# Patient Record
Sex: Female | Born: 1935 | Race: White | Hispanic: No | Smoking: Former smoker
Health system: Southern US, Community
[De-identification: ages and names within clinical notes are randomized; demographics above are authoritative.]

## PROBLEM LIST (undated history)

## (undated) DIAGNOSIS — K219 Gastro-esophageal reflux disease without esophagitis: Secondary | ICD-10-CM

## (undated) DIAGNOSIS — J189 Pneumonia, unspecified organism: Secondary | ICD-10-CM

## (undated) DIAGNOSIS — N390 Urinary tract infection, site not specified: Secondary | ICD-10-CM

## (undated) DIAGNOSIS — I5042 Chronic combined systolic (congestive) and diastolic (congestive) heart failure: Secondary | ICD-10-CM

## (undated) DIAGNOSIS — I255 Ischemic cardiomyopathy: Secondary | ICD-10-CM

## (undated) DIAGNOSIS — I442 Atrioventricular block, complete: Secondary | ICD-10-CM

## (undated) DIAGNOSIS — R000 Tachycardia, unspecified: Secondary | ICD-10-CM

## (undated) DIAGNOSIS — M1990 Unspecified osteoarthritis, unspecified site: Secondary | ICD-10-CM

## (undated) DIAGNOSIS — J449 Chronic obstructive pulmonary disease, unspecified: Secondary | ICD-10-CM

## (undated) DIAGNOSIS — I10 Essential (primary) hypertension: Secondary | ICD-10-CM

## (undated) DIAGNOSIS — I951 Orthostatic hypotension: Secondary | ICD-10-CM

## (undated) DIAGNOSIS — I6529 Occlusion and stenosis of unspecified carotid artery: Secondary | ICD-10-CM

## (undated) DIAGNOSIS — M8580 Other specified disorders of bone density and structure, unspecified site: Secondary | ICD-10-CM

## (undated) DIAGNOSIS — Z950 Presence of cardiac pacemaker: Secondary | ICD-10-CM

## (undated) DIAGNOSIS — R0602 Shortness of breath: Secondary | ICD-10-CM

## (undated) DIAGNOSIS — R55 Syncope and collapse: Secondary | ICD-10-CM

## (undated) DIAGNOSIS — Z8489 Family history of other specified conditions: Secondary | ICD-10-CM

## (undated) DIAGNOSIS — G459 Transient cerebral ischemic attack, unspecified: Secondary | ICD-10-CM

## (undated) HISTORY — DX: Essential (primary) hypertension: I10

## (undated) HISTORY — DX: Unspecified osteoarthritis, unspecified site: M19.90

## (undated) HISTORY — DX: Tachycardia, unspecified: R00.0

## (undated) HISTORY — DX: Presence of cardiac pacemaker: Z95.0

## (undated) HISTORY — DX: Other specified disorders of bone density and structure, unspecified site: M85.80

## (undated) HISTORY — PX: PACEMAKER PLACEMENT: SHX43

## (undated) HISTORY — PX: CHOLECYSTECTOMY: SHX55

## (undated) HISTORY — DX: Chronic combined systolic (congestive) and diastolic (congestive) heart failure: I50.42

## (undated) HISTORY — DX: Ischemic cardiomyopathy: I25.5

## (undated) HISTORY — DX: Atrioventricular block, complete: I44.2

## (undated) HISTORY — DX: Occlusion and stenosis of unspecified carotid artery: I65.29

## (undated) HISTORY — DX: Chronic obstructive pulmonary disease, unspecified: J44.9

---

## 1997-06-07 ENCOUNTER — Other Ambulatory Visit: Admission: RE | Admit: 1997-06-07 | Discharge: 1997-06-07 | Payer: Self-pay | Admitting: *Deleted

## 1998-06-23 ENCOUNTER — Other Ambulatory Visit: Admission: RE | Admit: 1998-06-23 | Discharge: 1998-06-23 | Payer: Self-pay | Admitting: *Deleted

## 1999-05-18 ENCOUNTER — Encounter: Payer: Self-pay | Admitting: *Deleted

## 1999-05-18 ENCOUNTER — Encounter: Admission: RE | Admit: 1999-05-18 | Discharge: 1999-05-18 | Payer: Self-pay | Admitting: *Deleted

## 1999-08-12 ENCOUNTER — Other Ambulatory Visit: Admission: RE | Admit: 1999-08-12 | Discharge: 1999-08-12 | Payer: Self-pay | Admitting: *Deleted

## 1999-08-14 ENCOUNTER — Encounter: Payer: Self-pay | Admitting: *Deleted

## 1999-08-14 ENCOUNTER — Encounter: Admission: RE | Admit: 1999-08-14 | Discharge: 1999-08-14 | Payer: Self-pay | Admitting: *Deleted

## 1999-10-28 ENCOUNTER — Encounter: Admission: RE | Admit: 1999-10-28 | Discharge: 1999-10-28 | Payer: Self-pay | Admitting: *Deleted

## 1999-10-28 ENCOUNTER — Encounter: Payer: Self-pay | Admitting: *Deleted

## 2000-03-28 ENCOUNTER — Inpatient Hospital Stay (HOSPITAL_COMMUNITY): Admission: EM | Admit: 2000-03-28 | Discharge: 2000-04-01 | Payer: Self-pay | Admitting: Emergency Medicine

## 2000-03-28 ENCOUNTER — Encounter: Payer: Self-pay | Admitting: Emergency Medicine

## 2000-03-29 ENCOUNTER — Encounter: Payer: Self-pay | Admitting: Cardiology

## 2000-03-31 ENCOUNTER — Encounter: Payer: Self-pay | Admitting: Internal Medicine

## 2000-08-12 ENCOUNTER — Other Ambulatory Visit: Admission: RE | Admit: 2000-08-12 | Discharge: 2000-08-12 | Payer: Self-pay | Admitting: *Deleted

## 2000-10-28 ENCOUNTER — Encounter: Payer: Self-pay | Admitting: Internal Medicine

## 2000-10-28 ENCOUNTER — Encounter: Admission: RE | Admit: 2000-10-28 | Discharge: 2000-10-28 | Payer: Self-pay | Admitting: *Deleted

## 2001-09-12 ENCOUNTER — Other Ambulatory Visit: Admission: RE | Admit: 2001-09-12 | Discharge: 2001-09-12 | Payer: Self-pay | Admitting: *Deleted

## 2001-11-01 ENCOUNTER — Encounter: Admission: RE | Admit: 2001-11-01 | Discharge: 2001-11-01 | Payer: Self-pay | Admitting: *Deleted

## 2001-11-01 ENCOUNTER — Encounter: Payer: Self-pay | Admitting: *Deleted

## 2002-11-05 ENCOUNTER — Encounter: Admission: RE | Admit: 2002-11-05 | Discharge: 2002-11-05 | Payer: Self-pay | Admitting: Internal Medicine

## 2002-11-05 ENCOUNTER — Encounter: Payer: Self-pay | Admitting: Internal Medicine

## 2002-11-14 HISTORY — PX: HERNIA REPAIR: SHX51

## 2002-11-29 ENCOUNTER — Encounter: Admission: RE | Admit: 2002-11-29 | Discharge: 2002-11-29 | Payer: Self-pay

## 2002-11-30 ENCOUNTER — Ambulatory Visit (HOSPITAL_BASED_OUTPATIENT_CLINIC_OR_DEPARTMENT_OTHER): Admission: RE | Admit: 2002-11-30 | Discharge: 2002-11-30 | Payer: Self-pay

## 2003-05-29 ENCOUNTER — Emergency Department (HOSPITAL_COMMUNITY): Admission: EM | Admit: 2003-05-29 | Discharge: 2003-05-30 | Payer: Self-pay | Admitting: Emergency Medicine

## 2003-08-08 ENCOUNTER — Encounter: Admission: RE | Admit: 2003-08-08 | Discharge: 2003-08-08 | Payer: Self-pay | Admitting: Internal Medicine

## 2003-11-22 ENCOUNTER — Encounter: Admission: RE | Admit: 2003-11-22 | Discharge: 2003-11-22 | Payer: Self-pay | Admitting: Internal Medicine

## 2004-01-06 ENCOUNTER — Other Ambulatory Visit: Admission: RE | Admit: 2004-01-06 | Discharge: 2004-01-06 | Payer: Self-pay | Admitting: *Deleted

## 2004-03-31 ENCOUNTER — Ambulatory Visit: Payer: Self-pay | Admitting: Internal Medicine

## 2004-05-05 ENCOUNTER — Ambulatory Visit: Payer: Self-pay | Admitting: Internal Medicine

## 2004-05-29 ENCOUNTER — Ambulatory Visit: Payer: Self-pay | Admitting: Internal Medicine

## 2004-07-01 ENCOUNTER — Ambulatory Visit: Payer: Self-pay | Admitting: Internal Medicine

## 2004-07-31 ENCOUNTER — Ambulatory Visit: Payer: Self-pay | Admitting: Internal Medicine

## 2004-09-01 ENCOUNTER — Ambulatory Visit: Payer: Self-pay | Admitting: Internal Medicine

## 2004-10-01 ENCOUNTER — Ambulatory Visit: Payer: Self-pay | Admitting: Internal Medicine

## 2004-10-13 HISTORY — PX: EYE SURGERY: SHX253

## 2004-11-03 ENCOUNTER — Ambulatory Visit (HOSPITAL_COMMUNITY): Admission: RE | Admit: 2004-11-03 | Discharge: 2004-11-04 | Payer: Self-pay | Admitting: Ophthalmology

## 2004-11-26 ENCOUNTER — Ambulatory Visit: Payer: Self-pay | Admitting: Internal Medicine

## 2004-12-17 ENCOUNTER — Ambulatory Visit: Payer: Self-pay | Admitting: Internal Medicine

## 2004-12-18 ENCOUNTER — Encounter: Admission: RE | Admit: 2004-12-18 | Discharge: 2004-12-18 | Payer: Self-pay | Admitting: Internal Medicine

## 2005-01-14 ENCOUNTER — Ambulatory Visit: Payer: Self-pay | Admitting: Internal Medicine

## 2005-02-16 ENCOUNTER — Ambulatory Visit: Payer: Self-pay | Admitting: Internal Medicine

## 2005-04-08 ENCOUNTER — Ambulatory Visit: Payer: Self-pay | Admitting: Internal Medicine

## 2005-05-06 ENCOUNTER — Ambulatory Visit: Payer: Self-pay | Admitting: Internal Medicine

## 2005-06-04 ENCOUNTER — Ambulatory Visit: Payer: Self-pay | Admitting: Internal Medicine

## 2005-06-24 ENCOUNTER — Emergency Department (HOSPITAL_COMMUNITY): Admission: EM | Admit: 2005-06-24 | Discharge: 2005-06-24 | Payer: Self-pay | Admitting: Emergency Medicine

## 2005-07-14 ENCOUNTER — Ambulatory Visit: Payer: Self-pay | Admitting: Internal Medicine

## 2005-08-24 ENCOUNTER — Ambulatory Visit: Payer: Self-pay | Admitting: Internal Medicine

## 2005-09-27 ENCOUNTER — Ambulatory Visit: Payer: Self-pay | Admitting: Internal Medicine

## 2005-11-03 ENCOUNTER — Ambulatory Visit: Payer: Self-pay | Admitting: Internal Medicine

## 2005-12-01 ENCOUNTER — Ambulatory Visit: Payer: Self-pay | Admitting: Internal Medicine

## 2005-12-20 ENCOUNTER — Encounter: Admission: RE | Admit: 2005-12-20 | Discharge: 2005-12-20 | Payer: Self-pay | Admitting: Internal Medicine

## 2005-12-30 ENCOUNTER — Ambulatory Visit: Payer: Self-pay | Admitting: Internal Medicine

## 2006-01-27 ENCOUNTER — Ambulatory Visit: Payer: Self-pay | Admitting: Internal Medicine

## 2006-02-21 ENCOUNTER — Ambulatory Visit: Payer: Self-pay | Admitting: Internal Medicine

## 2006-03-24 ENCOUNTER — Ambulatory Visit: Payer: Self-pay | Admitting: Internal Medicine

## 2006-04-07 ENCOUNTER — Ambulatory Visit: Payer: Self-pay | Admitting: Internal Medicine

## 2006-04-21 ENCOUNTER — Ambulatory Visit: Payer: Self-pay | Admitting: Internal Medicine

## 2006-05-19 ENCOUNTER — Ambulatory Visit: Payer: Self-pay | Admitting: Internal Medicine

## 2006-06-16 ENCOUNTER — Ambulatory Visit: Payer: Self-pay | Admitting: Internal Medicine

## 2006-06-24 ENCOUNTER — Encounter: Admission: RE | Admit: 2006-06-24 | Discharge: 2006-06-24 | Payer: Self-pay | Admitting: Internal Medicine

## 2006-07-11 ENCOUNTER — Other Ambulatory Visit: Admission: RE | Admit: 2006-07-11 | Discharge: 2006-07-11 | Payer: Self-pay | Admitting: Obstetrics & Gynecology

## 2006-07-14 ENCOUNTER — Ambulatory Visit: Payer: Self-pay | Admitting: Internal Medicine

## 2006-07-27 ENCOUNTER — Ambulatory Visit (HOSPITAL_COMMUNITY): Admission: RE | Admit: 2006-07-27 | Discharge: 2006-07-27 | Payer: Self-pay | Admitting: Gastroenterology

## 2006-08-11 ENCOUNTER — Ambulatory Visit: Payer: Self-pay | Admitting: Internal Medicine

## 2006-09-08 ENCOUNTER — Ambulatory Visit: Payer: Self-pay | Admitting: Internal Medicine

## 2006-10-06 ENCOUNTER — Ambulatory Visit: Payer: Self-pay | Admitting: Internal Medicine

## 2006-11-03 ENCOUNTER — Ambulatory Visit: Payer: Self-pay | Admitting: Internal Medicine

## 2006-12-01 ENCOUNTER — Ambulatory Visit: Payer: Self-pay | Admitting: Internal Medicine

## 2006-12-28 ENCOUNTER — Encounter: Admission: RE | Admit: 2006-12-28 | Discharge: 2006-12-28 | Payer: Self-pay | Admitting: Internal Medicine

## 2006-12-29 ENCOUNTER — Ambulatory Visit: Payer: Self-pay | Admitting: Internal Medicine

## 2007-01-26 ENCOUNTER — Ambulatory Visit: Payer: Self-pay | Admitting: Internal Medicine

## 2007-02-23 ENCOUNTER — Ambulatory Visit: Payer: Self-pay | Admitting: Internal Medicine

## 2007-03-20 ENCOUNTER — Ambulatory Visit: Payer: Self-pay

## 2007-06-22 ENCOUNTER — Ambulatory Visit: Payer: Self-pay | Admitting: Internal Medicine

## 2007-09-14 ENCOUNTER — Ambulatory Visit: Payer: Self-pay | Admitting: Internal Medicine

## 2007-10-22 ENCOUNTER — Inpatient Hospital Stay (HOSPITAL_COMMUNITY): Admission: EM | Admit: 2007-10-22 | Discharge: 2007-10-24 | Payer: Self-pay | Admitting: Emergency Medicine

## 2007-10-22 ENCOUNTER — Ambulatory Visit: Payer: Self-pay | Admitting: Cardiology

## 2007-10-23 ENCOUNTER — Encounter (INDEPENDENT_AMBULATORY_CARE_PROVIDER_SITE_OTHER): Payer: Self-pay | Admitting: Emergency Medicine

## 2007-12-19 ENCOUNTER — Ambulatory Visit: Payer: Self-pay | Admitting: Internal Medicine

## 2008-01-01 ENCOUNTER — Encounter: Admission: RE | Admit: 2008-01-01 | Discharge: 2008-01-01 | Payer: Self-pay | Admitting: Internal Medicine

## 2008-04-09 ENCOUNTER — Ambulatory Visit: Payer: Self-pay | Admitting: Internal Medicine

## 2008-04-11 ENCOUNTER — Encounter: Payer: Self-pay | Admitting: Internal Medicine

## 2008-05-01 ENCOUNTER — Ambulatory Visit: Payer: Self-pay | Admitting: Internal Medicine

## 2008-05-27 ENCOUNTER — Emergency Department (HOSPITAL_COMMUNITY): Admission: EM | Admit: 2008-05-27 | Discharge: 2008-05-27 | Payer: Self-pay | Admitting: Emergency Medicine

## 2008-05-29 ENCOUNTER — Ambulatory Visit: Payer: Self-pay | Admitting: Internal Medicine

## 2008-06-06 DIAGNOSIS — M949 Disorder of cartilage, unspecified: Secondary | ICD-10-CM

## 2008-06-06 DIAGNOSIS — M1990 Unspecified osteoarthritis, unspecified site: Secondary | ICD-10-CM | POA: Insufficient documentation

## 2008-06-06 DIAGNOSIS — M899 Disorder of bone, unspecified: Secondary | ICD-10-CM | POA: Insufficient documentation

## 2008-06-06 DIAGNOSIS — I442 Atrioventricular block, complete: Secondary | ICD-10-CM

## 2008-06-06 DIAGNOSIS — Z950 Presence of cardiac pacemaker: Secondary | ICD-10-CM | POA: Insufficient documentation

## 2008-06-06 DIAGNOSIS — J439 Emphysema, unspecified: Secondary | ICD-10-CM

## 2008-06-06 DIAGNOSIS — I10 Essential (primary) hypertension: Secondary | ICD-10-CM

## 2008-06-06 DIAGNOSIS — R000 Tachycardia, unspecified: Secondary | ICD-10-CM | POA: Insufficient documentation

## 2008-07-03 ENCOUNTER — Ambulatory Visit: Payer: Self-pay | Admitting: Internal Medicine

## 2008-07-31 ENCOUNTER — Ambulatory Visit: Payer: Self-pay | Admitting: Internal Medicine

## 2008-08-28 ENCOUNTER — Ambulatory Visit: Payer: Self-pay | Admitting: Internal Medicine

## 2008-09-23 ENCOUNTER — Encounter: Payer: Self-pay | Admitting: Internal Medicine

## 2008-09-23 ENCOUNTER — Ambulatory Visit: Payer: Self-pay

## 2008-10-24 ENCOUNTER — Ambulatory Visit: Payer: Self-pay | Admitting: Internal Medicine

## 2008-11-19 ENCOUNTER — Emergency Department (HOSPITAL_COMMUNITY): Admission: EM | Admit: 2008-11-19 | Discharge: 2008-11-20 | Payer: Self-pay | Admitting: Emergency Medicine

## 2008-11-20 ENCOUNTER — Telehealth: Payer: Self-pay | Admitting: Internal Medicine

## 2008-11-27 ENCOUNTER — Ambulatory Visit: Payer: Self-pay | Admitting: Internal Medicine

## 2009-01-01 ENCOUNTER — Ambulatory Visit: Payer: Self-pay | Admitting: Internal Medicine

## 2009-01-07 ENCOUNTER — Ambulatory Visit: Payer: Self-pay | Admitting: Internal Medicine

## 2009-01-13 ENCOUNTER — Ambulatory Visit: Payer: Self-pay | Admitting: Internal Medicine

## 2009-01-16 LAB — CONVERTED CEMR LAB
BUN: 17 mg/dL (ref 6–23)
Basophils Absolute: 0.1 10*3/uL (ref 0.0–0.1)
CO2: 29 meq/L (ref 19–32)
Eosinophils Absolute: 0.1 10*3/uL (ref 0.0–0.7)
GFR calc non Af Amer: 56.04 mL/min (ref >60)
Glucose, Bld: 101 mg/dL — ABNORMAL HIGH (ref 70–99)
HCT: 45 % (ref 36.0–46.0)
Hemoglobin: 15.2 g/dL — ABNORMAL HIGH (ref 12.0–15.0)
Lymphs Abs: 1.5 10*3/uL (ref 0.7–4.0)
MCHC: 33.7 g/dL (ref 30.0–36.0)
Monocytes Absolute: 0.5 10*3/uL (ref 0.1–1.0)
Monocytes Relative: 7.5 % (ref 3.0–12.0)
Neutro Abs: 4.6 10*3/uL (ref 1.4–7.7)
Platelets: 192 10*3/uL (ref 150.0–400.0)
Potassium: 4.1 meq/L (ref 3.5–5.1)
RDW: 13.6 % (ref 11.5–14.6)
aPTT: 27.2 s (ref 21.7–28.8)

## 2009-01-20 ENCOUNTER — Ambulatory Visit: Payer: Self-pay | Admitting: Internal Medicine

## 2009-01-20 ENCOUNTER — Ambulatory Visit (HOSPITAL_COMMUNITY): Admission: RE | Admit: 2009-01-20 | Discharge: 2009-01-20 | Payer: Self-pay | Admitting: Internal Medicine

## 2009-02-03 ENCOUNTER — Ambulatory Visit: Payer: Self-pay

## 2009-02-03 ENCOUNTER — Encounter: Payer: Self-pay | Admitting: Internal Medicine

## 2009-03-17 ENCOUNTER — Ambulatory Visit: Payer: Self-pay | Admitting: Internal Medicine

## 2009-06-16 ENCOUNTER — Encounter: Admission: RE | Admit: 2009-06-16 | Discharge: 2009-06-16 | Payer: Self-pay | Admitting: Internal Medicine

## 2009-09-29 ENCOUNTER — Ambulatory Visit: Payer: Self-pay | Admitting: Internal Medicine

## 2009-09-29 DIAGNOSIS — I472 Ventricular tachycardia, unspecified: Secondary | ICD-10-CM | POA: Insufficient documentation

## 2010-04-01 ENCOUNTER — Encounter: Payer: Self-pay | Admitting: Internal Medicine

## 2010-04-01 ENCOUNTER — Ambulatory Visit: Admission: RE | Admit: 2010-04-01 | Discharge: 2010-04-01 | Payer: Self-pay | Source: Home / Self Care

## 2010-04-05 ENCOUNTER — Encounter: Payer: Self-pay | Admitting: Internal Medicine

## 2010-04-14 NOTE — Cardiovascular Report (Signed)
Summary: Office Visit   Office Visit   Imported By: Roderic Ovens 10/01/2009 16:18:18  _____________________________________________________________________  External Attachment:    Type:   Image     Comment:   External Document

## 2010-04-14 NOTE — Assessment & Plan Note (Signed)
    Patient Instructions: 1)  Your physician recommends that you schedule a follow-up appointment in: 12 MONTHS

## 2010-04-14 NOTE — Cardiovascular Report (Signed)
Summary: Office Visit   Office Visit   Imported By: Roderic Ovens 03/20/2009 15:26:10  _____________________________________________________________________  External Attachment:    Type:   Image     Comment:   External Document

## 2010-04-14 NOTE — Assessment & Plan Note (Signed)
Summary: 1 year rov      Allergies Added:   Primary Provider:  [name]  CC:  1 year rov.  Pt states she is feeling fine.  She also states her BP is only high in the doctors office and fine at home.Marland Kitchen  History of Present Illness:   Jamie [name] is seen in followup for complete heart block for which he is status post pacemaker implantation which  is programmed  in the VDD mode.  she had no complaints of chest pain or shortness of breath; there has been no change in her exercise tolerance.  she is exercising and feels terrific  Echo 2009 demonstrated    Left ventricular ejection fraction was estimated to be 60 %.   -  There was mild mitral annular calcification. There was mild         mitral valvular regurgitation.  Physical Exam  General:  The patient was alert and oriented in no acute distress. HEENT Normal.  Neck veins were flat, carotids were brisk.  Lungs were clear.  Heart sounds were regular without murmurs or gallops.  Abdomen was soft with active bowel sounds. There is no clubbing cyanosis or edema. Skin Warm and dry    Current Medications (verified): 1)  Metoprolol Tartrate 50 Mg Tabs (Metoprolol Tartrate) .... One By Mouth Two Times A Day 2)  Alprazolam 0.5 Mg Tabs (Alprazolam) .... As Needed For Sleep 3)  Caltrate 600 1500 Mg Tabs (Calcium Carbonate) .... One By Mouth Daily 4)  Fish Oil 1200 Mg Caps (Omega-3 Fatty Acids) .... Take 1 By Mouth Two Times A Day 5)  Daily Vitamins  Tabs (Multiple Vitamin) .... One By Mouth Daily 6)  Glucosamine Chondroitin Complx  Caps (Glucosamine-Chondroit-Biofl-Mn) .... One By Mouth Daily 7)  Aspirin 81 Mg Tbec (Aspirin) .... Take One Tablet By Mouth Daily 8)  Proair Hfa 108 (90 Base) Mcg/act Aers (Albuterol Sulfate) .... 2 Puffs As Needed 9)  Cod Liver Oil  Oil (Cod Liver Oil) .... Take 1 By Mouth Once Daily 10)  Garlic Oil 1000 Mg Caps (Garlic) .... Take 1-2 Weekly  Allergies (verified): 1)  * Vicodin  Past History:  Past  Medical History: Last updated: 06/06/2008 AV BLOCK, COMPLETE (ICD-426.0) PACEMAKER, PERMANENT (ICD-V45.01) TACHYCARDIA (ICD-785.0) HYPERTENSION, UNSPECIFIED (ICD-401.9) OSTEOPENIA (ICD-733.90) OSTEOARTHRITIS (ICD-715.90) COPD (ICD-496)    Past Surgical History: Last updated: 06/06/2008 hernia -- 9/04 eyes - 8/06 pacemaker -- medtronic  Family History: Last updated: 06/06/2008 Negative for any premature coronary artery disease  Social History: Last updated: 06/06/2008 Tobacco Use - Former. 1989 Alcohol Use - no Drug Use - no Retired   Vital Signs:  Patient profile:   77 year old female Height:      64 inches Weight:      129 pounds BMI:     22.22 Pulse rate:   61 / minute Pulse rhythm:   regular BP sitting:   172 / 80  (left arm) Cuff size:   regular  Vitals Entered By: Judithe Modest CMA (September 29, 2009 9:28 AM)   PPM Specifications Following MD:  Sherryl Manges, MD     PPM Vendor:  Medtronic     PPM Model Number:  ADDRO1     PPM Serial Number:  [id number] PPM DOI:  11/0812010     PPM Implanting MD:  Sherryl Manges, MD  Lead 1    Location: RA     DOI: 03/30/2000     Model #: 1342T     Serial #:  [id number]     Status: active Lead 2    Location: RV     DOI: 03/30/2000     Model #: [id number]     Serial #: [id number]     Status: active  Magnet Response Rate:  BOL 85 ERI 65  Indications:  CHB  Explantation Comments:  11/8/12010 Medtronic Kappa [id number] H  explanted  PPM Follow Up Battery Voltage:  2.79 V     Battery Est. Longevity:  42yrs     Pacer Dependent:  Yes     Right Ventricle  Amplitude: PACED mV, Impedance: 525 ohms, Threshold: 0.750 V at 0.40 msec  Episodes Coumadin:  No Ventricular High Rate:  1     Ventricular Pacing:  100%  Parameters Mode:  VDD     Lower Rate Limit:  60     Upper Rate Limit:  120 Paced AV Delay:  N/A     Sensed AV Delay:  120 Next Cardiology Appt Due:  03/16/2010 Tech Comments:  1 VHR EPISODE LASTING 6 SECONDS.  BATTERY LONGEVITY 12  YRS.  NORMAL DEVICE FUNCTION.  CHANGED RV OUTPUT FROM 2.00 TO 2.50 V.  ROV IN 6 MTHS W/CLINIC. Jamie [name]  September 29, 2009 9:44 AM  Impression & Recommendations:  Problem # 1:  AV BLOCK, COMPLETE (ICD-426.0) The patient has complete heart block. She also apparently has a pacemaker the doesn't like to pace the  atrium. As such she is programmed in the VDD mode.  as noted she has some AV isorhythmic dissociation with loss of P. synchronous pacing Her updated medication list for this problem includes:    Metoprolol Tartrate 50 Mg Tabs (Metoprolol tartrate) ..... One by mouth two times a day    Aspirin 81 Mg Tbec (Aspirin) .Marland Kitchen... Take one tablet by mouth daily  Problem # 2:  PACEMAKER, PERMANENT (ICD-V45.01) Device parameters and data were reviewed and no changes were made  Problem # 3:  VENTRICULAR TACHYCARDIA (ICD-427.1) identified on her device; however, with her normal left ventricular function we will not pursue this further; it is monorhythmic Her updated medication list for this problem includes:    Metoprolol Tartrate 50 Mg Tabs (Metoprolol tartrate) ..... One by mouth two times a day    Aspirin 81 Mg Tbec (Aspirin) .Marland Kitchen... Take one tablet by mouth daily  Other Orders: EKG w/ Interpretation (93000)  Patient Instructions: 1)  Your physician wants you to follow-up in:6 months with device clinic.   You will receive a reminder letter in the mail two months in advance. If you don't receive a letter, please call our office to schedule the follow-up appointment.

## 2010-04-14 NOTE — Assessment & Plan Note (Signed)
Summary: 3 mo f/u    Visit Type:  3 mo f/u Primary Provider:  [name]  CC:  pt states she once in awhile feels a "ping" where her pacemaker is and thinks this is from maybe reaching too far..pt also wants to k now if it is too early since new pacemaker insert to have her mammogram..denies any other complaints today.  History of Present Illness:   Jamie [name] is seen in followup for complete heart block for which he is status post pacemaker implantation which  is programmed  in the VDD mode.  she had no complaints of chest pain or shortness of breath; there has been no change in her exercise tolerance.  she is exercising and feels terrific  Problems Prior to Update: 1)  Av Block, Complete  (ICD-426.0) 2)  Pacemaker, Permanent  (ICD-V45.01) 3)  Tachycardia  (ICD-785.0) 4)  Hypertension, Unspecified  (ICD-401.9) 5)  Osteopenia  (ICD-733.90) 6)  Osteoarthritis  (ICD-715.90) 7)  COPD  (ICD-496)  Current Medications (verified): 1)  Metoprolol Tartrate 50 Mg Tabs (Metoprolol Tartrate) .... One By Mouth Two Times A Day 2)  Alprazolam 0.5 Mg Tabs (Alprazolam) .... As Needed For Sleep 3)  Caltrate 600 1500 Mg Tabs (Calcium Carbonate) .... One By Mouth Daily 4)  Fish Oil 1200 Mg Caps (Omega-3 Fatty Acids) .... Take 1 By Mouth Two Times A Day 5)  Daily Vitamins  Tabs (Multiple Vitamin) .... One By Mouth Daily 6)  Glucosamine Chondroitin Complx  Caps (Glucosamine-Chondroit-Biofl-Mn) .... One By Mouth Daily 7)  Aspirin 81 Mg Tbec (Aspirin) .... Take One Tablet By Mouth Daily 8)  Proair Hfa 108 (90 Base) Mcg/act Aers (Albuterol Sulfate) .... 2 Puffs As Needed 9)  Cod Liver Oil  Oil (Cod Liver Oil) .... Take 1 By Mouth Once Daily 10)  Garlic Oil 1000 Mg Caps (Garlic) .... Take 1-2 Weekly  Allergies: 1)  * Vicodin  Vital Signs:  Patient profile:   77 year old female Height:      64 inches Weight:      129 pounds Pulse rate:   75 / minute Pulse rhythm:   regular BP sitting:   132 /  80  (right arm) Cuff size:   large  Vitals Entered By: Danielle Rankin, CMA (March 17, 2009 4:00 PM)  Physical Exam  General:  The patient was alert and oriented in no acute distress. HEENT Normal.  Neck veins were flat, carotids were brisk.  Lungs were clear.  Heart sounds were regular without murmurs or gallops.  Abdomen was soft with active bowel sounds. There is no clubbing cyanosis or edema. Skin Warm and dry    EKG  Procedure date:  03/17/2009  Findings:      P. synchronous pacing with an R. prime in lead V1 and negative QRS across the precordium  PPM Specifications Following MD:  Sherryl Manges, MD     PPM Vendor:  Medtronic     PPM Model Number:  ADDRO1     PPM Serial Number:  [id number] PPM DOI:  11/0812010     PPM Implanting MD:  Sherryl Manges, MD  Lead 1    Location: RA     DOI: 03/30/2000     Model #: 1342T     Serial #: [id number]     Status: active Lead 2    Location: RV     DOI: 03/30/2000     Model #: [id number]     Serial #: [id number]  Status: active  Magnet Response Rate:  BOL 85 ERI 65  Indications:  CHB  Explantation Comments:  11/8/12010 Medtronic Kappa [id number] H  explanted  PPM Follow Up Remote Check?  No Battery Voltage:  2.79 V     Battery Est. Longevity:  10 YEARS     Pacer Dependent:  Yes     Right Ventricle  Amplitude: PACED AT 30 mV, Impedance: 508 ohms, Threshold: 0.75 V at 0.4 msec  Episodes MS Episodes:  0     Coumadin:  No Ventricular High Rate:  0     Ventricular Pacing:  100%  Parameters Mode:  VDD     Lower Rate Limit:  60     Upper Rate Limit:  120 Paced AV Delay:  N/A     Sensed AV Delay:  120 Next Cardiology Appt Due:  01/13/2010 Tech Comments:  Normal device function.  No changes made today.  ROV 11-11 SK. Gypsy Balsam RN BSN  March 17, 2009 4:29 PM   Impression & Recommendations:  Problem # 1:  AV BLOCK, COMPLETE (ICD-426.0) stable post pacemaker implantation Her updated medication list for this problem includes:     Metoprolol Tartrate 50 Mg Tabs (Metoprolol tartrate) ..... One by mouth two times a day    Aspirin 81 Mg Tbec (Aspirin) .Marland Kitchen... Take one tablet by mouth daily  Problem # 2:  TACHYCARDIA (ICD-785.0) no identified arrhythmias as part of device interrogation  Problem # 3:  PACEMAKER, PERMANENT (ICD-V45.01) normal device function  Appended Document: 3 mo f/u F/U 12 MONTHS

## 2010-04-16 NOTE — Procedures (Signed)
Summary: pacer check.mdt.amber  Medications Added VITAMIN D 1000 UNIT TABS (CHOLECALCIFEROL) one by mouth daily EYE VITAMINS  TABS (MULTIPLE VITAMINS-MINERALS) one by mouth daily OSTEO BI-FLEX ADV JOINT SHIELD  TABS (MISC NATURAL PRODUCTS) 2 by mouth daily      Allergies Added: NKDA  Current Medications (verified): 1)  Metoprolol Tartrate 50 Mg Tabs (Metoprolol Tartrate) .... One By Mouth Two Times A Day 2)  Alprazolam 0.5 Mg Tabs (Alprazolam) .... As Needed For Sleep 3)  Caltrate 600 1500 Mg Tabs (Calcium Carbonate) .... One By Mouth Daily 4)  Fish Oil 1200 Mg Caps (Omega-3 Fatty Acids) .... Take 1 By Mouth Two Times A Day 5)  Glucosamine Chondroitin Complx  Caps (Glucosamine-Chondroit-Biofl-Mn) .... One By Mouth Daily 6)  Aspirin 81 Mg Tbec (Aspirin) .... Take One Tablet By Mouth Daily 7)  Proair Hfa 108 (90 Base) Mcg/act Aers (Albuterol Sulfate) .... 2 Puffs As Needed 8)  Cod Liver Oil  Oil (Cod Liver Oil) .... Take 1 By Mouth Once Daily 9)  Garlic Oil 1000 Mg Caps (Garlic) .... Take 1-2 Weekly 10)  Vitamin D 1000 Unit Tabs (Cholecalciferol) .... One By Mouth Daily 11)  Eye Vitamins  Tabs (Multiple Vitamins-Minerals) .... One By Mouth Daily 12)  Osteo Bi-Flex Adv Joint Shield  Tabs (Misc Natural Products) .... 2 By Mouth Daily  Allergies (verified): No Known Drug Allergies  PPM Specifications Following MD:  Sherryl Manges, MD     PPM Vendor:  Medtronic     PPM Model Number:  ADDRO1     PPM Serial Number:  [id number] H PPM DOI:  11/0812010     PPM Implanting MD:  Sherryl Manges, MD  Lead 1    Location: RA     DOI: 03/30/2000     Model #: 1342T     Serial #: [id number]     Status: active Lead 2    Location: RV     DOI: 03/30/2000     Model #: 2841     Serial #: [id number]     Status: active  Magnet Response Rate:  BOL 85 ERI 65  Indications:  CHB  Explantation Comments:  11/8/12010 Medtronic Kappa [id number] H  explanted  PPM Follow Up Battery Voltage:  2.79 V     Battery Est.  Longevity:  10.5 yrs     Pacer Dependent:  Yes     Right Ventricle  Amplitude: PACED mV, Impedance: 515 ohms, Threshold: 0.50 V at 0.40 msec  Episodes MS Episodes:  2     Percent Mode Switch:  <0.1%     Coumadin:  No Ventricular High Rate:  1     Ventricular Pacing:  99.9%  Parameters Mode:  VDD     Lower Rate Limit:  60     Upper Rate Limit:  120 Paced AV Delay:  N/A     Sensed AV Delay:  120 Next Cardiology Appt Due:  09/14/2010 Tech Comments:  2 MODE SWITCHES--BOTH LESS THAN 1 MINUTE.  1 VHR EPISODE LASTING 12 SECONDS.  NORMAL DEVICE FUNCTION.  NO CHANGES MADE. ROV IN 6 MTHS W/SK. [name]  April 01, 2010 10:00 AM

## 2010-04-22 NOTE — Cardiovascular Report (Signed)
Summary: Office Visit   Office Visit   Imported By: Roderic Ovens 04/16/2010 10:34:54  _____________________________________________________________________  External Attachment:    Type:   Image     Comment:   External Document

## 2010-05-21 ENCOUNTER — Other Ambulatory Visit: Payer: Self-pay | Admitting: Obstetrics & Gynecology

## 2010-05-21 DIAGNOSIS — Z1231 Encounter for screening mammogram for malignant neoplasm of breast: Secondary | ICD-10-CM

## 2010-06-19 LAB — URINALYSIS, ROUTINE W REFLEX MICROSCOPIC
Glucose, UA: NEGATIVE mg/dL
Ketones, ur: NEGATIVE mg/dL
Protein, ur: NEGATIVE mg/dL
Urobilinogen, UA: 0.2 mg/dL (ref 0.0–1.0)

## 2010-06-19 LAB — BASIC METABOLIC PANEL
BUN: 15 mg/dL (ref 6–23)
CO2: 30 mEq/L (ref 19–32)
Calcium: 9.3 mg/dL (ref 8.4–10.5)
Chloride: 102 mEq/L (ref 96–112)
Creatinine, Ser: 0.89 mg/dL (ref 0.4–1.2)
Glucose, Bld: 101 mg/dL — ABNORMAL HIGH (ref 70–99)

## 2010-06-19 LAB — CBC
MCHC: 33.6 g/dL (ref 30.0–36.0)
MCV: 100.6 fL — ABNORMAL HIGH (ref 78.0–100.0)
RBC: 4.17 MIL/uL (ref 3.87–5.11)
RDW: 13.2 % (ref 11.5–15.5)

## 2010-06-19 LAB — URINE MICROSCOPIC-ADD ON

## 2010-06-25 LAB — DIFFERENTIAL
Basophils Relative: 1 % (ref 0–1)
Eosinophils Absolute: 0.2 10*3/uL (ref 0.0–0.7)
Eosinophils Relative: 3 % (ref 0–5)
Lymphs Abs: 1.1 10*3/uL (ref 0.7–4.0)
Monocytes Relative: 9 % (ref 3–12)

## 2010-06-25 LAB — CBC
HCT: 46.7 % — ABNORMAL HIGH (ref 36.0–46.0)
MCHC: 34.1 g/dL (ref 30.0–36.0)
MCV: 97 fL (ref 78.0–100.0)
RBC: 4.82 MIL/uL (ref 3.87–5.11)
WBC: 6.5 10*3/uL (ref 4.0–10.5)

## 2010-06-25 LAB — BASIC METABOLIC PANEL
BUN: 12 mg/dL (ref 6–23)
CO2: 29 mEq/L (ref 19–32)
Chloride: 100 mEq/L (ref 96–112)
Creatinine, Ser: 0.96 mg/dL (ref 0.4–1.2)
GFR calc Af Amer: >60 mL/min (ref >60)
Potassium: 4.2 mEq/L (ref 3.5–5.1)

## 2010-06-25 LAB — POCT CARDIAC MARKERS
CKMB, poc: <1 ng/mL — ABNORMAL LOW (ref 1.0–8.0)
Myoglobin, poc: 85.4 ng/mL (ref 12–200)
Troponin i, poc: <0.05 ng/mL (ref 0.00–0.09)

## 2010-06-25 LAB — BRAIN NATRIURETIC PEPTIDE: Pro B Natriuretic peptide (BNP): 150 pg/mL — ABNORMAL HIGH (ref 0.0–100.0)

## 2010-07-21 ENCOUNTER — Ambulatory Visit
Admission: RE | Admit: 2010-07-21 | Discharge: 2010-07-21 | Disposition: A | Discharge status: Home / Self Care | Payer: Medicare Other | Source: Ambulatory Visit | Attending: Obstetrics & Gynecology | Admitting: Obstetrics & Gynecology

## 2010-07-21 DIAGNOSIS — Z1231 Encounter for screening mammogram for malignant neoplasm of breast: Secondary | ICD-10-CM

## 2010-07-28 NOTE — Op Note (Signed)
Jamie [name], Jamie [name]                 ACCOUNT NO.:  [id number]   MEDICAL RECORD NO.:  [id number]          PATIENT TYPE:  AMB   LOCATION:  ENDO                         FACILITY:  [facility]   PHYSICIAN:  Anselmo Rod, M.D.  DATE OF BIRTH:  [birth year]   DATE OF PROCEDURE:  07/27/2006  DATE OF DISCHARGE:                               OPERATIVE REPORT   PROCEDURE PERFORMED:  Screening colonoscopy.   ENDOSCOPIST:  Dr. Anselmo Rod.   INSTRUMENT USED:  Pentax video colonoscope.   INDICATIONS FOR PROCEDURE:  An 77 year old white female undergoing a  screening colonoscopy.  The patient has had a history of change in bowel  habits with looser stools in the recent past.  She has responded well to  cholestyramine. A  colonoscopy has been done to rule out colonic polyps,  masses, etc.   PREPROCEDURE PREPARATION:  Informed consent was procured from the  patient.  The patient fasted for 8 hours prior to the procedure and  prepped with a gallon of NuLytely the night prior to the procedure. The  risks and benefits of the procedure including a 10% miss rate of cancer  and polyp were discussed with the patient as well.   PREPROCEDURE PHYSICAL:  The patient had stable vital signs.  Neck  supple.  Chest clear to auscultation. S1, S2 regular. Pacemaker present  on the left chest. Abdomen soft with normal bowel sounds.   DESCRIPTION OF PROCEDURE:  The patient was placed in the left lateral  decubitus position and sedated with 70 mcg of Fentanyl and 7 mg of  Versed given intravenously in slow incremental doses. Once the patient  was adequately sedated and maintained on low-flow oxygen and continuous  cardiac monitoring, the Pentax video colonoscope was advanced from the  rectum to the cecum.  The appendiceal orifice and ileocecal valve were  clearly visualized and photographed.  A few sigmoid diverticula were  present.  No other masses or polyps were seen. Retroflexion in the  rectum revealed no  abnormalities. The patient tolerated the procedure  well without complications.   IMPRESSION:  A few sigmoid diverticula present, otherwise normal  colonoscopy of the cecum. No masses or polyps seen.   RECOMMENDATIONS:  1. Continue high fiber diet with liberal fluid intake.  2. Continue cholestyramine as needed for problems with diarrhea.  3. Repeat colonoscopy in the next 10 years; if the patient has any      abnormal symptoms prior to that she should contact the office      immediately for further recommendations.      Anselmo Rod, M.D.  Electronically Signed     JNM/MEDQ  D:  07/27/2006  T:  07/27/2006  Job:  [id number]   cc:   M. Leda Quail, MD

## 2010-07-28 NOTE — Consult Note (Signed)
NAMESANARI, OFFNER                 ACCOUNT NO.:  [id number]   MEDICAL RECORD NO.:  [id number]          PATIENT TYPE:  INP   LOCATION:  4706                         FACILITY:  [facility]   PHYSICIAN:  Jesse Sans. Wall, MD, FACCDATE OF BIRTH:  25-Jul-1922   DATE OF CONSULTATION:  10/22/2007  DATE OF DISCHARGE:                                 CONSULTATION   I was asked by Dr. Rodrigo Ran to evaluate and consult on Jamie [name]  with the acute onset of dyspnea and tachycardia.   HISTORY OF PRESENT ILLNESS:  Jamie [name] is a delightful, very  active 77 year old white female who has a history of complete heart  block, status post dual-chamber Kappa 400 Medtronic device in 2002.   She has a long history of hypertension and had been treated for about 7  years with Lopressor 50 b.i.d.  This was tapered recently over the last  week.  She went from 50 b.i.d. to 25 b.i.d. to 12.5 b.i.d. every couple  of days.  She discontinued 3 days ago and started lisinopril.   She has felt very tired over the last few days.  She had been a little  more short of breath.  She has chronic lung disease and a history of  apparently some bronchitic asthma.   She just got back from a long trip in [place].  It was very hot and  muggy.  She had a lot of shortness of breath there.  She was very tired.   She woke up this morning early with feeling short of breath.  She did  not complain any tachy palpitations.  She checked her blood pressure and  it was 185-190 systolic.   She called 911, because she was very frightened.   Here, her O2 sats were noted initially to be in the 80s on room air.  They are now 96% on 2 L.  She was not noted to be wheezing.  Her chest x-  ray showed COPD with no acute changes and no heart failure or  infiltrate.  Her heart rate was in the 130s sinus tach.  Her EKG shows  no acute changes.   PAST MEDICAL HISTORY:  Remarkable for:  1. Asthma.  2. COPD.  3. Hypertension.  4.  Osteoarthritis.  5. Question of coronary artery disease with decreased EF by echo of      35%-40% in 2002.  She had a stress test about the same time, which      showed a normal EF and was negative for ischemia.  This is somewhat      of a conundrum.  There was a question of TIA in 2001.   ALLERGIES:  She has no known drug allergies.   MEDICATIONS:  Lisinopril 10 mg p.o. b.i.d., multivitamins, vitamin D,  calcium, and cod liver oil.   SOCIAL HISTORY:  She lives in [place] alone.  She is a retired Banker.  She quit smoking 20 years ago.   FAMILY HISTORY:  Negative for any premature coronary artery disease.   REVIEW OF SYSTEMS:  Other  than the HPI is negative times all systems and  points of care.  Specifically, no constitutional, HEENT, skin,  cardiopulmonary, GU, neuropsychiatric, musculoskeletal, GI, or endocrine  complaints.   At this specific point, she denied any lip swelling or swelling of her  tongue or any difficulty swallowing.   PHYSICAL EXAMINATION:  GENERAL:  She is very pleasant.  She looks much  younger than stated age.  VITAL SIGNS:  Her blood pressure is currently 130/80, her pulse is 110  and sinus tach, her temperature is 98.2, respiratory rate is 20, and  sats 96% on 2 L.  She is in no acute distress.  Her skin is warm and  dry.  She is sitting upright in bed.  There is no JVD.  HEENT:  Normocephalic and atraumatic.  PERRLA.  Extraocular movements  intact.  Sclerae clear.  Facial symmetry is normal.  Dentition is  relatively satisfactory for her age.  NECK:  Supple.  Carotid upstrokes are equal bilaterally without bruits.  There is no thyroid enlargement.  There is no tracheal tenderness.  There is no evidence of macroglossia.  LUNGS:  Clear to auscultation and percussion.  There are no rales, no  wheezes whatsoever.  HEART:  Dynamic PMI.  Normal S1 and S2.  No gallop.  ABDOMEN:  Soft.  Good bowel sounds.  No midline bruit.  No hepatomegaly.   EXTREMITIES:  No cyanosis, clubbing, or edema.  Pulses are brisk.  She  has varicose veins.  There is no sign of DVT.   LABORATORY DATA:  Hemoglobin of 14.9, WBC of 6.8, and potassium of 4.7.  Normal LFTs.  BNP of only 70 and D-dimer is negative at 0.46.  First  point-of-care markers were negative.  EKG shows sinus rhythm with some V  pacing.   ASSESSMENT:  Progressive dyspnea and acute onset of dyspnea and  tachycardia.  Most likely, etiology is Lopressor or beta blocker  withdrawal.  I do not see any evidence of acute lung disease or asthma.  The drop in her O2 sat is concerning.  There is no sign of an  angioedema.  There is no sign of heart failure.  Obviously, an ischemic  cause needs to be ruled out.   PLAN:  1. Begin back Lopressor 25 mg p.o. b.i.d.  2. Enteric-coated aspirin 81 mg.  3. Check cardiac markers.  4. A 2-D echocardiogram to decide if her EF is good or not.  5. Interrogate pacer.  6. Even if the enzymes are negative, consider stress Myoview.   Thank you very much for the consultation.       Thomas C. Daleen Squibb, MD, [facility]  Electronically Signed     TCW/MEDQ  D:  10/22/2007  T:  10/23/2007  Job:  [id number]   cc:   Loraine Leriche A. Perini, M.D.  Duke Salvia, MD, [facility]

## 2010-07-28 NOTE — Assessment & Plan Note (Signed)
[facility]                         ELECTROPHYSIOLOGY OFFICE NOTE   NAME:Jamie [name], Jamie [name]                        MRN:          [id number]  DATE:04/09/2008                            DOB:          Jun 02, 1922    Followup for pacemaker implanted for intermittent complete heart block  about 8 years ago.  She is doing quite well.  She has no complaints of  chest pain.  Her breathing problems are stable.  She noted that she was  sneezing and coughing while she was taking Singulair.  She stop the  Singulair, symptoms improved.  She tried to go back on the Singulair,  the symptoms worsened.   She has since stopped taking herself off the Singulair.  She was under  the mistaken impression that Singulair and Advair were redundant  medications.  I reviewed this her extensively.  Hopefully, this was  clear.   Her medications apart from the Singulair include:  1. Metoprolol 50 b.i.d.  2. Glucosamine.  3. Aspirin.   On examination, her blood pressure was 140/80, her pulse was 72.  Weight  was 136 which is up 5 pounds in the last 6 months.  Her neck veins were  flat.  Her lungs were clear with decreased breath sounds.  Heart sounds  were regular.  Extremities had no edema.   Interrogation of her Medtronic Kappa pulse generator demonstrates a P-  wave of 1 and R-wave of 11.2 with impedance of 589 and threshold 1 V at  0.4, and she is in the VDD mode.   IMPRESSION:  1. Complete heart block - intermittent.  2. Status post pacer for the above programmed in VDD mode.  3. Hypertension.  4. Chronic obstructive pulmonary disease.   Jamie [name] is doing pretty well.  She is going to try the Singulair  again, and I have asked her to follow up with Dr. Waynard Edwards after that  trial.   We will see her again in 1 year's time.     Duke Salvia, MD, [facility]  Electronically Signed    SCK/MedQ  DD: 04/09/2008  DT: 04/10/2008  Job #: [id number]   cc:   Loraine Leriche A. Perini, M.D.

## 2010-07-28 NOTE — Letter (Signed)
September 14, 2007    Mark A. Waynard Edwards, M.D.  [address]  [place], [place] [address]   RE:  Jamie [name], Jamie [name]  MRN:  [id number]  /  DOB:  November 09, 1922   Dear Loraine Leriche,   Mrs. Milham came in today.  She feels really good.  She brought with  her a long list of blood pressures, which ranged from the 103 range  mostly to the 120 range with an out light of 141.  This stand in  contradistinction to her blood pressure today in the office of 158.  Her  medications include metoprolol 50 b.i.d.  She is also on fish oil,  Ocuvite, and aspirin.   On examination, her blood pressure was as noted.  Her pulse was 68.  Her  lungs were clear.  Heart sounds were regular.  Extremities were without  edema.   Interrogation of Medtronic kappa pulse generator demonstrates a P-wave  of 1.  She is programmed in the VDD mode.  She has no intrinsic  ventricular rhythm.  The threshold is 1 volt to 0.5 with impedance of  575.   She has an estimated longevity of 17 months.   IMPRESSION:  1. Complete heart block.  2. Status post pacer for the above with a Medtronic device programmed      VDD.  3. Hypertension, well controlled.   Mrs. Centola is doing well.  She is tolerating her beta-blocker.  I do  not know what her thoughts are on beta-blockers now given the recent  recommendations as primary therapy.  I will defer this to you, but I  would love to gleam something from your expertise on this.   We will see her again in 6 months' time.    Sincerely,      Duke Salvia, MD, [facility]  Electronically Signed    SCK/MedQ  DD: 09/14/2007  DT: 09/14/2007  Job #: [id number]

## 2010-07-28 NOTE — H&P (Signed)
NAMESHARNAE, WINFREE NO.:  [id number]   MEDICAL RECORD NO.:  [id number]          PATIENT TYPE:  EMS   LOCATION:  MAJO                         FACILITY:  [facility]   PHYSICIAN:  Mark A. Perini, M.D.   DATE OF BIRTH:  1922/03/16   DATE OF ADMISSION:  10/22/2007  DATE OF DISCHARGE:                              HISTORY & PHYSICAL   CHIEF COMPLAINT:  Tachycardia and dyspnea.   Jamie [name] is a pleasant 77 year old female with past medical history as listed  below.  Jamie [name] was recently switched from her metoprolol to lisinopril as  an ACE inhibitor may be a better primary therapy for hypertension than a  beta blocker in her case, and also given her history of asthma and  possible COPD it was felt that a beta blocker may not be the best choice  for her.  Jamie [name] just made this change 2 days ago.  Jamie [name] reports about a 1-  week history, however, of increasing shortness of breath and dyspnea  with exertion.  Jamie [name] was in [place] last week and was around a lot of  cats and had some increased wheezing at that time.  Jamie [name] feels a clogged-  up feeling in her throat somewhat.  Jamie [name] denies any definite chest pain  but does feel hot in her chest.  Suddenly at 5 o'clock this morning, Jamie [name]  woke up not feeling well.  Blood pressure was 180/90, her heart was  racing and was recorded at 130 beats per minute.  EMS was obtained.  Apparently her oxygen saturations were in the 80s on room air initially  and then came up into the 90s on oxygen supplementation.  In the  emergency room, the only evident finding is COPD on her x-ray.  Jamie [name] will  be admitted for further care.   PAST MEDICAL HISTORY:  1. Complete heart block with dual chamber pacemaker placed      approximately 6 years ago.  2. Hypertension.  3. A few scattered diverticula on a 2008 colonoscopy.  4. History of asthma.  5. Osteopenia.  6. Osteoarthritis.  7. Chronic microhematuria.  8. Cataracts.  9. Possibly a silent MI in the past as noted  with some wall motion      abnormalities on a remote echo.   ALLERGIES:  None.   MEDICATIONS:  1. Lisinopril, which I believe the dose is 1/2 of a 10-mg pill twice a      day.  2. Multivitamin daily.  3. Calcium supplement.  4. Vitamin D supplement.  5. Cod liver oil.   Jamie [name] has been off Advair for quite a long time and has not needed it.  Jamie [name] does use albuterol occasionally.  Jamie [name] stopped aspirin 3 days ago as  Jamie [name] read that it might interact with her lisinopril in her pharmacy-  provided information.  Jamie [name] takes glucosamine chondroitin and Xanax 0.5  mg 1/2 to 1 as needed.   SOCIAL HISTORY:  Remote tobacco.  Lives alone.  No alcohol.  No drug  use.  Two daughters are with her in the room.   FAMILY  HISTORY:  Noncontributory.   REVIEW OF SYSTEMS:  Jamie [name] denies any diarrhea, constipation, nausea,  vomiting.  No urinary symptoms and no blood from above or below.   PHYSICAL EXAMINATION:  VITAL SIGNS:  Temperature 98.2, blood pressure  154/70, now 124/62, respiratory rate is 18, pulse is 100, sinus rhythm,  mild sinus tachycardia.  Jamie [name] now has 95% saturation on 2 liters of  oxygen.  GENERAL:  Jamie [name] is in no acute distress, sitting semi-supine on the  stretcher.  Jamie [name] has decreased breath sounds bilaterally but no  significant wheezes, rales or rhonchi.  HEART:  Regular rate and rhythm with no murmur, rub or gallop.  Jamie [name] is  slightly tachycardic.  ABDOMEN:  Soft, nontender, nondistended with no mass or  hepatosplenomegaly.  NEUROLOGIC:  Jamie [name] is grossly neurologically intact and has no lower  extremity edema.   EKG shows normal sinus rhythm with ventricular pacing.  Chest x-ray  shows COPD but no acute findings.  Laboratory data shows D-dimer which  is normal at 0.46.  Urinalysis shows moderate blood, moderate  leukocytes, but is otherwise negative.  Urine microscopic shows 3 to 6  white cells, 0 to 2 red cells and rare squamous cells and rare bacteria.  BNP is 70.  Sodium 138,  potassium 4.7, chloride 103, CO2 28, BUN 19,  creatinine 0.87, glucose 99.  LFTs normal.  White count 6.8 with a  normal differential.  Hemoglobin 14.9, platelet count 184,000.  The CK-  MB is 1.1, troponin I less than 0.05, myoglobin 82.   ASSESSMENT/PLAN:  Hypoxia and dyspnea on exertion, which I believe is  most likely all due to asthma and chronic obstructive pulmonary disease  with an acute exacerbation and the fact that Jamie [name] has not been on any  sort of maintenance inhalers; however, Jamie [name] does have this possible  history of a silent MI in the past, and we must consider that her  symptoms could be due to some coronary ischemia.  We will admit her.  We  will obtain serial cardiac enzymes.  We will place her on a telemetry  bed.  We will treat her with inhaled Pulmicort as well as Xopenex and  Spiriva.  We will give supplemental oxygen as needed, and we will ask  cardiology to see her along with [place].      Mark A. Perini, M.D.  Electronically Signed     MAP/MEDQ  D:  10/22/2007  T:  10/22/2007  Job:  [id number]

## 2010-07-31 NOTE — Op Note (Signed)
NAMETRAN, RANDLE NO.:  [id number]   MEDICAL RECORD NO.:  [id number]          PATIENT TYPE:  OIB   LOCATION:  2858                         FACILITY:  [facility]   PHYSICIAN:  Guadelupe Sabin, M.D.DATE OF BIRTH:  1923-02-10   DATE OF PROCEDURE:  11/03/2004  DATE OF DISCHARGE:                                 OPERATIVE REPORT   PREOPERATIVE DIAGNOSES:  1.  Retained dislocated cataract lens fragments, left eye.  2.  Secondary inflammatory glaucoma, left eye.   SURGEON:  Guadelupe Sabin, M.D.   ASSISTANT:  Nurse.   ANESTHESIA:  General.   OPERATIVE PROCEDURE:  1.  Pars plana vitrectomy using vitreous infusion suction cutter.  2.  Peripheral iridotomy.   OPERATIVE PROCEDURE:  After the patient was prepped and draped, a lid  speculum was inserted in the left eye.  The eye was turned downward and a  superior rectus traction suture placed.  A peritomy was performed adjacent  to the limbus from the 9 to 4 o'clock position.  Subconjunctival tissue was  cleaned and 3 sclerotomy sites prepared at the 10, 2, and 4 o'clock  position, 3.5 mm from the limbus using the 20-gauge MVR blade.  The 4-mm  vitreous infusion terminal was secured in place at the 4 o'clock position  with a 5-0 green Mersilene suture.  The fiberoptic light pipe was inserted  at the 2 o'clock position and the handpiece of the vitreous infusion suction  cutter at the 10 o'clock position.  Slow vitreous infusion and suction  cutting were begun behind the anterior chamber intraocular lens implant.  It  was noted that there was some bleeding at the pupillary margin; this was  felt probably related to some adherent vitreous on the pupillary border  sphincter.  The pupil remained round throughout the procedure.  The vitreous  was extremely fibrinous and with many pigment epithelial cells.  One or two  large cortical fragments were removed from the mid-vitreous.  The vitreous  itself was trimmed back to  the vitreous base 360 degrees.  With scleral  depression, the large fragment of cortex or nucleus located in the pars  plana area at the 6 o'clock position was aspirated and removed.  Indirect  ophthalmoscopy and scleral depression were then carried out 360 degrees and  no further lens fragments were seen.  It was then elected to perform a  peripheral iridotomy at the 5 o'clock position to prevent postoperative iris  bombe glaucoma.  It was then elected to close.  The sclerotomy sites were  closed with 7-0 Vicryl sutures.  The conjunctiva was pulled forward and  closed with a running 7-0 Vicryl suture.  Depo-Garamycin  and dexamethasone were injected in the subtenon space inferiorly.  Maxitrol  ointment was instilled in the conjunctival cul-de-sac and a light patch and  protective shield applied.  Duration of procedure -- 1 hour.  The patient  tolerated the procedure well in general and left the operating room for the  recovery room in good condition.           ______________________________  Guadelupe Sabin, M.D.     HNJ/MEDQ  D:  11/03/2004  T:  11/04/2004  Job:  [id number]   cc:   Loraine Leriche T. Nile Riggs, M.D.  Fax: [phone]   Loraine Leriche A. Waynard Edwards, M.D.  [address]  [place]  [place] [address]  Fax: [phone]

## 2010-07-31 NOTE — H&P (Signed)
Jamie [name]:  [id number]   MEDICAL RECORD NO.:  [id number]          PATIENT TYPE:  OIB   LOCATION:  2858                         FACILITY:  [facility]   PHYSICIAN:  Guadelupe Sabin, M.D.DATE OF BIRTH:  October 28, 1922   DATE OF ADMISSION:  11/03/2004  DATE OF DISCHARGE:                                HISTORY & PHYSICAL   REASON FOR ADMISSION:  This was an urgent outpatient admission of this 77-  year-old white female admitted with retained cataract lens fragments  dislocated into her vitreous with associated elevated intraocular pressure  and iritis.   PRESENT ILLNESS:  This patient had complicated cataract implant surgery  performed on September 02, 2004 at the [facility] by her regular  ophthalmologist, Dr. Jethro Bolus.  At surgery, the posterior capsule broke,  and Dr. Nile Riggs performed an anterior vitrectomy ,and placed an anterior  chamber intraocular lens implant. Postoperatively, the patient did well  initially, but then gradually developed injection pain and blurred vision in  the operated eye.  It was felt that the patient had increasing iritis and  secondary inflammatory glaucoma.  The patient was placed on prednisone and  homatropine, but the pressure continued to elevate, and the patient was  referred to my office for further care.  The patient was first seen in my  office on October 30, 2004, at which time her vision was recorded without  correction at 20/300 in each eye, and with correction 20/30 right eye,  20/100 left eye. Applanation tonometry 18 mm right eye, 20 left eye on  medication consisting of Cosopt ophthalmic solution 1 drop three times a  day.  Slit lamp examination revealed an inflamed left eye with vitreous in  the anterior chamber, an anterior chamber implant, and marked vitreous haze  with pigmented floaters and cortical lens fragments.  With indirect  ophthalmoscopy and scleral depression, a large fragment of  cataract was  present at the pars plana area measuring about 1-1/2 mm in size.  The retina  was attached.  The optic nerves sharply outlined good color.  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 No peripheral retinal  tears could be seen.  It was felt the patient warranted vitrectomy surgery  to remove the dislocated fragments as the possible cause of the of the  inflammatory response and elevated intraocular pressure.  The patient was  given oral discussion and printed information concerning vitrectomy surgery.  She signed an informed consent and arrangements were made for her outpatient  admission at this time.   The patient is under the care of Dr. Waynard Edwards.   PAST MEDICAL HISTORY:  1.  The patient has chronic asthma.  2.  Reflux disease.  3.  Cardiac arrhythmia controlled with a pacemaker.   CURRENT MEDICATIONS:  1.  Metoprolol.  2.  __________.  3.  Albuterol.  4.  Protonix.  5.  Multivitamins.  6.  Calcium.  7.  Aspirin which she has discontinued since June.  REVIEW OF SYSTEMS:  No cardiorespiratory complaints.   PHYSICAL EXAMINATION:  GENERAL:  The patient is a pleasant, anxious 77-year-  old white female in acute ocular distress.  HEENT:  Eyes:  Visual acuity 20/300 each eye without correction, 20/30 right  eye, 20/100 left eye with correction. Applanation tonometry 18 mm right eye,  20 left eye.   ADMISSION DIAGNOSES:  Dislocated cataract fragments, chronic iritis,  elevated intraocular pressure, inflammatory glaucoma.   SURGICAL PLAN:  Posterior vitrectomy with removal of dislocated cataract  fragments.           ______________________________  Guadelupe Sabin, M.D.     HNJ/MEDQ  D:  11/03/2004  T:  11/03/2004  Job:  [id number]   cc:   Loraine Leriche A. Waynard Edwards, M.D.  [address]  [place]  [place] [address]  Fax: [phone]   Marcelyn Bruins. Nile Riggs, M.D.  Fax:  [phone]

## 2010-07-31 NOTE — Discharge Summary (Signed)
[name], [name]                 ACCOUNT NO.:  [id number]   MEDICAL RECORD NO.:  [id number]          PATIENT TYPE:  INP   LOCATION:  4706                         FACILITY:  [facility]   PHYSICIAN:  Mark A. Perini, M.D.   DATE OF BIRTH:  06-25-1922   DATE OF ADMISSION:  10/22/2007  DATE OF DISCHARGE:  10/24/2007                               DISCHARGE SUMMARY   DISCHARGE DIAGNOSES:  1. Chronic obstructive pulmonary disease and asthma with an acute      exacerbation of chronic obstructive pulmonary disease with some      associated hypoxia.  2. Tachycardia to some degree due to rebound from beta-blocker      withdrawal.  3. Hypertension.  4. Pacemaker with past history of complete heart block.  5. Osteopenia.  6. Osteoarthritis.   CONSULTATION:  Cardiology consultation.   PROCEDURES:  1. A 2-D echocardiogram performed on October 23, 2007, showing septal      dyssynergy may be related to pacemaker, left ventricular ejection      fraction was 60%.  There was mild mitral annular calcification and      mild mitral valvular regurgitation and there was appearance of a      pacing wire in the right ventricle.  There were no definite wall      motion abnormalities noted.  2. Chest x-ray on October 22, 2007, showing chronic obstructive      pulmonary disease, but no acute findings.   DISCHARGE MEDICATIONS:  1. She is to stop lisinopril.  2. She may resume her multivitamin and her cod liver oil supplement.  3. She may resume her vitamin D and calcium and glucosamine      supplements.  4. She is to take 81 mg aspirin daily.  5. Xanax 0.5 mg at bedtime is permissible.  6. She is to take Advair 250/50 one puff twice daily.  7. Spiriva 18 mcg 1 puff daily.  8. Albuterol MDI 2 puffs as needed every 4 hours.  9. Nystatin mouthwash if needed.  10.Metoprolol 50 mg twice daily.   HISTORY OF PRESENT ILLNESS:  Jamie [name] is a pleasant 77 year old female.  She  was recently switched from metoprolol to  lisinopril for her hypertension  despite a slow taper of her metoprolol.  She presented to the emergency  room with a 1-week history of increasing shortness of breath and dyspnea  on exertion.  She had significant wheezing as well.  In the emergency  room, she was noted to be tachycardic with a heart rate of 130 beats per  minute and blood pressure of 180/90.  She is also hypoxic with initial  oxygen saturations in the mid to low 80s on room air, which came up into  the 90s with oxygen supplementation.  Given her constellation of  findings, it was felt that she would require admission.   HOSPITAL COURSE:  Jamie [name] was admitted, her pacemaker was checked, and no  changes were made to its setting.  She was treated for a possible COPD  or asthma exacerbation, and Cardiology was consulted.  It is felt  that  some of her tachycardia could have been due to beta-blocker withdrawal  and she was restarted on her metoprolol.  The metoprolol did not seem to  make any of her wheezing worse.  Her blood pressure remained under good  control and she had no further tachycardia episodes.  Her symptoms  gradually improved, and she did have some desaturations down to 86% to  88% while walking on room air at the time of discharge.  Therefore,  supplemental oxygen to be used with exertion was provided.  On October 24, 2007, she was deemed stable for discharge home.   DISCHARGE PHYSICAL EXAMINATION:  VITAL SIGNS:  Temperature 98, afebrile,  pulse 77, respiratory rate 18, blood pressure 122/72, and 90% to 92%  saturation on room air at rest.  GENERAL:  She is in no acute distress.  LUNGS:  Clear to auscultation bilaterally with no wheezes, rales, or  rhonchi.  HEART:  Regular rate and rhythm with no murmur, rub, or gallop.  ABDOMEN:  Soft, nontender, and nondistended with no mass or  hepatosplenomegaly.  EXTREMITIES:  There was no edema.  NEUROLOGIC:  She is alert and oriented x4 and neurologically grossly   intact.   DISCHARGE LABORATORY DATA:  White count 5.8 with a normal differential,  hemoglobin was 14.1, and platelet count 175,000.  Sodium 141, potassium  4.4, chloride 103, CO2 of 30, BUN 14, creatinine 0.82, glucose 104, and  calcium 9.4.  D-dimer on October 22, 2007, was normal at 0.46.  BNP on  admission was normal at 70 pg/mL.   DISCHARGE INSTRUCTIONS:  Jamie [name] is to follow a low-salt, heart-healthy  diet.  She is to increase her activity slowly.  She is to call if she  has any recurrent problems.  She is to check her blood pressure and  pulse once to twice weekly and keep a record of this.  She is to follow  up with Dr. Waynard Edwards in 2 weeks in the office.      Mark A. Perini, M.D.  Electronically Signed     MAP/MEDQ  D:  11/06/2007  T:  11/07/2007  Job:  [id number]   cc:   Duke Salvia, MD, [facility]

## 2010-07-31 NOTE — Discharge Summary (Signed)
. [facility]  Patient:    Jamie [name], Jamie [name]                        MRN: [id number] Adm. Date:  [id number] Disc. Date: [id number] Attending:  Rodrigo Ran A CC:         Nathen May, M.D., [facility]  Gerrit Friends. Dietrich Pates, M.D. [facility]   Discharge Summary  CONSULTING PHYSICIANS: 1. Nathen May, M.D., electrophysiology. 2. Gerrit Friends. Dietrich Pates, M.D., cardiology  DISCHARGE DIAGNOSES: 1. Intermittent complete heart block status post dual chamber pacemaker    placement this admission. 2. Probable atherosclerotic coronary artery disease with reduced left    ventricular function and segmental wall motion abnormalities by    echocardiogram. 3. History of asthma. 4. History of osteopenia. 5. History of osteoarthritis. 6. History of chronic microhematuria. 7. Left arm pain and lightheadedness this admission, possibly transient    although symptoms likely due to heart block.  Patient with similar    symptoms and possible transient ischemic attack in March 2001.  PROCEDURES: 1. Contrast venography and dual chamber pacemaker implantation. 2. Surface echocardiogram. 3. Computed tomography of the head.  DISCHARGE MEDICATIONS: 1. Lopressor 25 mg p.o. b.i.d. 2. Advair one puff b.i.d. as before. 3. Albuterol MDI p.r.n. as before. 4. Baby aspirin q.d. 5. Patient may resume vitamins and supplements including glucosamine,    calcium, Centrum and garlic.  HISTORY OF PRESENT ILLNESS:  Ms. Jamie [name] is a 77 year old female admitted with left arm pain and sensation of tingling down her left leg and lightheadedness with mild nausea.  She had a previous possible TIA in March 2001, but at that time had vertigo with no arm pain.  Her symptoms mostly resolved in the emergency room, however, she was noted to have a heart rate in the 38-45 range and subsequently was admitted for rule out of myocardial infarction and further evaluation.  HOSPITAL COURSE:  The EKG  showed first degree AV block with a left bundle branch block.  The patient had continued bradycardia, and cardiology was consulted.  Ms. Angst ruled out for myocardial infarction with serial enzymes.  She subsequently underwent an echocardiogram which showed decreased left ventricular function with an ejection fraction of 35-40% with hypokinesis of the mid and distal anterior, anterolateral and apical walls.  Furthermore, there is akinesis of the base and mid inferior and inferior septal walls.  She did have mildly increased left ventricular thickness.  There was mild right ventricular dilatation.  Subsequently, the patients heart rate dropped to the 20s and was transferred to the cardiac care unit.  She subsequently underwent permanent transvenous dual chamber pacemaker placement.  She tolerated this well although she did require one adjustment of her pacemaker setting.  She had no recurrent arm pain or other symptoms as on admission.  On April 01, 2000, she was deemed stable for discharge home.  DISCHARGE LABORATORY DATA:  On March 31, 2000, sodium 137, potassium 4.0, chloride 103, CO2 28, BUN 16, creatinine 1.0, glucose 115, calcium 8.7, magnesium 2.1, INR 1.0.  On March 30, 1999, white blood count was 6.4, hemoglobin 14.2, platelet count 192,000.  Thyroid studies during this admission were normal including a thyroxine level of 6.9, T3 level of 131.4. The lipid profile showed a total cholesterol of 180, triglycerides 69, HDL 53 and LDL 113.  TSH was 1.8.  DISCHARGE INSTRUCTIONS:  Activity - The patient was to be up as tolerated. She is not to  do any lifting with her left arm.  She is to follow a low cholesterol, low fat diet.  She is to otherwise be up as tolerated.  She is to not get her pacemaker wound wet for the next seven days.  She is to call if she has any recurrent problems.  FOLLOW-UP:  She is to follow with Dr. Graciela Husbands; their office will call her. Furthermore, she  will follow up with Dr. Dietrich Pates of cardiology and will need an outpatient Cardiolite study in the near future to evaluate if she has any reversible ischemia given her echocardiogram findings.  Finally, she will follow up with Dr. Waynard Edwards in four weeks.  She is to call our office for an appointment. DD:  04/01/00 TD:  04/02/00 Job: [id number]

## 2010-07-31 NOTE — Letter (Signed)
April 07, 2006    Mark A. Waynard Edwards, M.D.  [address]  [place], [place] [address]   RE:  Jamie, [name]  MRN:  [id number]  /  DOB:  [birth year]   Dear Jamie [name]:   After I talked to you about Jamie [name], I went in and she has been  recording her blood pressure for 3 months and is going to be bringing it  when she sees you next Monday, since I did not do anything.  The long  and short of it is that she is doing well.  Her pacemaker is functioning  normally.  Her blood pressure today is 177/82 as I mentioned, but as you  will note, that her blood pressures at home have been normal.  Lungs  were clear.  Heart sounds were regular.   Interrogation of Medtronic Kappa Pulse generator for her intermittent  complete heart block demonstrates a P wave of 1 and she has no intrinsic  ventricular rhythm.  The impedence was 603 and the threshold was 1 volt  at 0.4, the battery voltage was 2.76.   IMPRESSION:  1. Complete heart block.  2. Status post pacer for number 1. Programmed in the VDD mode.  3. White coat hypertension.   Jamie [name] is stable.  We will see her again in a year.    Sincerely,      Duke Salvia, MD, [facility]  Electronically Signed    SCK/MedQ  DD: 04/07/2006  DT: 04/07/2006  Job #: [id number]

## 2010-07-31 NOTE — Op Note (Signed)
NAME:  Jamie [name], Jamie [name]                           ACCOUNT NO.:  [id number]   MEDICAL RECORD NO.:  [id number]                   PATIENT TYPE:  AMB   LOCATION:  DSC                                  FACILITY:  [facility]   PHYSICIAN:  Lorre Munroe., M.D.            DATE OF BIRTH:  07-04-1922   DATE OF PROCEDURE:  11/30/2002  DATE OF DISCHARGE:                                 OPERATIVE REPORT   PREOPERATIVE DIAGNOSIS:  Indirect right inguinal hernia.   POSTOPERATIVE DIAGNOSIS:  Indirect right inguinal hernia.   OPERATION:  Repair of right inguinal hernia.   SURGEON:  Zigmund Daniel, M.D.   ANESTHESIA:  Local with sedation.   PROCEDURE:  After the patient was monitored and sedated and had routine  preparation and draping of the lower right abdomen, I liberally infused long  acting local anesthetic in the right groin area and then reinforced the  block as I deepened my dissection using a total of 19 mL of the local  anesthetic.  I then made a short transverse incision about 5-6 cm in length  from just above the pubic tubercle going laterally and dissected down  through the fat until I encountered the external oblique muscle.  I  identified the external ring and then opened the external oblique in the  direction of its fibers into the external ring taking care to avoid injuring  the ilioinguinal nerve.  I noted an expanded round ligament.  I exposed the  deep ring and then detached the round ligament near the pubic tubercle and  suture ligated it proximal and distal and removed part of it.  There was an  obvious hernia which was easily reducible through the deep ring and I  reduced it along with the remnant of the round ligament.  I took a  polypropylene mesh and sewed that in with 2-0 silk.  I then fashioned a  patch of polypropylene mesh to fit the inguinal floor and sewed that in with  running 2-0 Prolene basing it in the external oblique superiorly and running  it in the shelving  edge of the inguinal ligament inferiorly.  I fel the  hernia repair was secure.  I closed the external oblique and subcutaneous  tissues with running separate layers of intracuticular 4-0 Vicryl and and  running 3-0 Vicryl and closed the skin with intracuticular 4-0 Vicryl and  Steri-Strips.  The patient was stable through the procedure.                                               Lorre Munroe., M.D.    WB/MEDQ  D:  11/30/2002  T:  11/30/2002  Job:  [id number]   cc:   Loraine Leriche A. Perini, M.D.  [address]  [place]  [place] [address]  Fax: [phone]   Cheryle Horsfall, M.D.

## 2010-07-31 NOTE — Procedures (Signed)
[facility]  Patient:    Jamie [name], Jamie [name]                        MRN: [id number] Proc. Date: 03/30/00 Adm. Date:  [id number] Attending:  Ezequiel Kayser CC:         Rodrigo Ran, M.D.             Electrophysiology Laboratory             Delsa Grana, R.N.,                            Procedure Report  PREOPERATIVE DIAGNOSIS:  Complete heart block, intermittent.  POSTOPERATIVE DIAGNOSIS:  Complete heart block, intermittent.  PROCEDURE:  Contrast venography, dual-chamber pacemaker implantation.  DESCRIPTION OF PROCEDURE:  Following the obtaining of informed consent, the patient was brought to the cardiac catheterization laboratory and placed on the fluoroscopic table in the supine position.  After routine prep and drape of the left upper chest, intravenous contrast was injected via the left antecubital vein to identify the course and the patency of the extrathoracic left subclavian vein.  This having been accomplished, lidocaine was infiltrated in the prepectoral subclavicular region.  Incision was made and carried down to the level of the prepectoral fascia using electrocautery.  A pocket was formed using electrocautery.  Hemostasis was obtained.  Thereafter, attention was turned to gaining access to the extrathoracic left subclavian vein, which was accomplished with modest difficulty but without the aspiration of air or puncture of the artery.  Two separate venipunctures were obtained and two guidewires were placed and returned, and a 0 silk suture was placed in a figure-of-eight fashion and allowed to hang loosely.  Sequentially a 7 [place] and 9 French tear-away introducer sheath were placed, through which were passed the Medtronic CapsureFix 5076-52 cm length active-fixation ventricular lead, serial number [id number], and a Pacesetter 1342 passive-fixation atrial lead, serial number [id number].  Under fluoroscopic guidance, these were  manipulated to the right ventricular apex and right atrial appendage, respectively, where the bipolar R-wave was 20 millivolts with a pace impedance of 1035 Ohms and a pacing threshold shortly after screw-in of 0.9 volts at 0.5 milliseconds.  Currented threshold was 1.1 MA, and there was no diaphragmatic pacing at 10 volts.  The bipolar P-wave was 4.8 millivolts with an impedance of 593 Ohms and a pacing threshold of 0.5 milliseconds at 0.6 volts.  With these acceptable parameters reported, the leads were secured to the prepectoral fascia and attached to a Medtronic Kappa [id number] dual-chamber pulse generator.  Serial number is [id number].  P-synchronous pacing was identified.  The pocket was copiously irrigated with antibiotic-containing saline solution.  The leads of the pulse generator were then placed in the pocket, secured to the prepectoral fascia.  The wound was closed in three layers in the normal fashion.  The wound was washed and dried, and a benzoin and Steri-Strip dressing was then applied.  Needle counts, sponge counts, and instrument counts were correct at the end of the procedure according to the staff.  COMPLICATIONS:  The patient became hot and hypotensive with a blood pressure of 95.  This was volume-responsive.  She denied chest pain throughout the procedure.  Echo is pending at the present time. DD:  03/30/00 TD:  03/30/00 Job: [id number]

## 2010-10-19 ENCOUNTER — Encounter: Payer: Self-pay | Admitting: Internal Medicine

## 2010-11-04 ENCOUNTER — Emergency Department (HOSPITAL_COMMUNITY): Payer: Medicare Other

## 2010-11-04 ENCOUNTER — Inpatient Hospital Stay (HOSPITAL_COMMUNITY)
Admission: EM | Admit: 2010-11-04 | Discharge: 2010-11-06 | DRG: 282 | Disposition: A | Discharge status: Home / Self Care | Payer: Medicare Other | Attending: Internal Medicine | Admitting: Internal Medicine

## 2010-11-04 DIAGNOSIS — I214 Non-ST elevation (NSTEMI) myocardial infarction: Principal | ICD-10-CM | POA: Diagnosis present

## 2010-11-04 DIAGNOSIS — J45909 Unspecified asthma, uncomplicated: Secondary | ICD-10-CM | POA: Diagnosis present

## 2010-11-04 DIAGNOSIS — M1990 Unspecified osteoarthritis, unspecified site: Secondary | ICD-10-CM | POA: Diagnosis present

## 2010-11-04 DIAGNOSIS — I10 Essential (primary) hypertension: Secondary | ICD-10-CM | POA: Diagnosis present

## 2010-11-04 DIAGNOSIS — Z950 Presence of cardiac pacemaker: Secondary | ICD-10-CM

## 2010-11-04 DIAGNOSIS — I2510 Atherosclerotic heart disease of native coronary artery without angina pectoris: Secondary | ICD-10-CM | POA: Diagnosis present

## 2010-11-04 DIAGNOSIS — Z87891 Personal history of nicotine dependence: Secondary | ICD-10-CM

## 2010-11-04 DIAGNOSIS — H269 Unspecified cataract: Secondary | ICD-10-CM | POA: Diagnosis present

## 2010-11-04 DIAGNOSIS — Z7982 Long term (current) use of aspirin: Secondary | ICD-10-CM

## 2010-11-04 DIAGNOSIS — M899 Disorder of bone, unspecified: Secondary | ICD-10-CM | POA: Diagnosis present

## 2010-11-04 DIAGNOSIS — M949 Disorder of cartilage, unspecified: Secondary | ICD-10-CM | POA: Diagnosis present

## 2010-11-04 DIAGNOSIS — Z79899 Other long term (current) drug therapy: Secondary | ICD-10-CM

## 2010-11-04 DIAGNOSIS — I200 Unstable angina: Secondary | ICD-10-CM

## 2010-11-04 LAB — URINALYSIS, ROUTINE W REFLEX MICROSCOPIC
Glucose, UA: NEGATIVE mg/dL
Specific Gravity, Urine: 1.018 (ref 1.005–1.030)
pH: 6 (ref 5.0–8.0)

## 2010-11-04 LAB — CK TOTAL AND CKMB (NOT AT ARMC)
CK, MB: 3.6 ng/mL (ref 0.3–4.0)
Relative Index: INVALID (ref 0.0–2.5)

## 2010-11-04 LAB — COMPREHENSIVE METABOLIC PANEL
ALT: 33 U/L (ref 0–35)
CO2: 29 mEq/L (ref 19–32)
Calcium: 9.1 mg/dL (ref 8.4–10.5)
GFR calc Af Amer: >60 mL/min (ref >60)
GFR calc non Af Amer: >60 mL/min (ref >60)
Glucose, Bld: 116 mg/dL — ABNORMAL HIGH (ref 70–99)
Sodium: 140 mEq/L (ref 135–145)
Total Bilirubin: 0.4 mg/dL (ref 0.3–1.2)

## 2010-11-04 LAB — CBC
HCT: 43.4 % (ref 36.0–46.0)
MCHC: 34.6 g/dL (ref 30.0–36.0)
MCV: 94.8 fL (ref 78.0–100.0)
RDW: 13.2 % (ref 11.5–15.5)
WBC: 8.7 10*3/uL (ref 4.0–10.5)

## 2010-11-04 LAB — DIFFERENTIAL
Eosinophils Relative: 2 % (ref 0–5)
Lymphocytes Relative: 17 % (ref 12–46)
Lymphs Abs: 1.5 10*3/uL (ref 0.7–4.0)
Monocytes Absolute: 0.6 10*3/uL (ref 0.1–1.0)

## 2010-11-04 LAB — URINE MICROSCOPIC-ADD ON

## 2010-11-04 LAB — PROTIME-INR: Prothrombin Time: 14.1 seconds (ref 11.6–15.2)

## 2010-11-04 LAB — TROPONIN I: Troponin I: 0.8 ng/mL (ref <0.30)

## 2010-11-05 DIAGNOSIS — I369 Nonrheumatic tricuspid valve disorder, unspecified: Secondary | ICD-10-CM

## 2010-11-05 DIAGNOSIS — I214 Non-ST elevation (NSTEMI) myocardial infarction: Secondary | ICD-10-CM

## 2010-11-05 LAB — BASIC METABOLIC PANEL
BUN: 12 mg/dL (ref 6–23)
GFR calc Af Amer: >60 mL/min (ref >60)
GFR calc non Af Amer: >60 mL/min (ref >60)
Potassium: 4.4 mEq/L (ref 3.5–5.1)
Sodium: 139 mEq/L (ref 135–145)

## 2010-11-05 LAB — CBC
Platelets: 169 10*3/uL (ref 150–400)
RDW: 13.4 % (ref 11.5–15.5)
WBC: 6.9 10*3/uL (ref 4.0–10.5)

## 2010-11-05 LAB — LIPID PANEL
HDL: 55 mg/dL (ref >39)
LDL Cholesterol: 91 mg/dL (ref 0–99)
Total CHOL/HDL Ratio: 2.8 RATIO
VLDL: 10 mg/dL (ref 0–40)

## 2010-11-05 LAB — GLUCOSE, CAPILLARY

## 2010-11-05 LAB — HEPARIN LEVEL (UNFRACTIONATED): Heparin Unfractionated: 0.25 IU/mL — ABNORMAL LOW (ref 0.30–0.70)

## 2010-11-05 LAB — CARDIAC PANEL(CRET KIN+CKTOT+MB+TROPI)
Relative Index: INVALID (ref 0.0–2.5)
Total CK: 55 U/L (ref 7–177)
Troponin I: 0.79 ng/mL (ref <0.30)

## 2010-11-05 LAB — MRSA PCR SCREENING: MRSA by PCR: NEGATIVE

## 2010-11-06 DIAGNOSIS — I2510 Atherosclerotic heart disease of native coronary artery without angina pectoris: Secondary | ICD-10-CM

## 2010-11-06 LAB — CBC
MCH: 32.1 pg (ref 26.0–34.0)
Platelets: 172 10*3/uL (ref 150–400)
RBC: 4.52 MIL/uL (ref 3.87–5.11)
WBC: 8.3 10*3/uL (ref 4.0–10.5)

## 2010-11-06 LAB — URINE CULTURE: Colony Count: >=100000

## 2010-11-06 LAB — POCT ACTIVATED CLOTTING TIME: Activated Clotting Time: 149 seconds

## 2010-11-09 NOTE — Cardiovascular Report (Signed)
  NAMEIVEY, NEMBHARD NO.:  [id number]  MEDICAL RECORD NO.:  [id number]  LOCATION:  3710                         FACILITY:  [facility]  PHYSICIAN:  Terek Bee M. [place], M.D.  DATE OF BIRTH:  08-Oct-1922  DATE OF PROCEDURE:  11/06/2010 DATE OF DISCHARGE:                           CARDIAC CATHETERIZATION   INDICATIONS FOR PROCEDURE:  An 77 year old white female presented with nonspecific symptoms including left axillary and left arm pain.  She had elevated troponin levels.  She has a paced rhythm, so ECG is uninterpretable.  PROCEDURES:  Left heart catheterization, coronary and left ventricular angiography.  ACCESS:  Via the right femoral artery using standard Seldinger technique.  EQUIPMENT:  A 5-French 4-cm right and left Judkins catheter, 5-French pigtail catheter, 5-French arterial sheath.  MEDICATIONS:  Local anesthesia with 1% Xylocaine, Versed 1 mg IV.  CONTRAST:  80 mL of Omnipaque.  HEMODYNAMIC DATA:  Aortic pressure is 120/45 with a mean of 72 mmHg, left ventricular pressure is 121 with an EDP of 8 mmHg.  ANGIOGRAPHIC DATA:  The left coronary artery arises and distributes normally.  The left main coronary artery is normal.  The left anterior descending artery has 20% narrowing in the midvessel. Otherwise, it has only minor irregularities.  The left circumflex coronary artery has only minor irregularities throughout.  The right coronary artery is a dominant vessel.  It has 30% segmental disease in the proximal to mid vessel.  No obstructive lesions were noted.  Left ventricular angiography performed in the RAO view demonstrates normal left ventricular size with anterior wall dyssynergy due to her paced rhythm.  Overall ejection fraction is 50-55%.  FINAL INTERPRETATION: 1. Nonobstructive coronary artery disease. 2. Normal left ventricular function.  PLAN:  We will continue medical management.          ______________________________ Alizza Sacra  M. [place], M.D.     PMJ/MEDQ  D:  11/06/2010  T:  11/06/2010  Job:  [id number]  cc:   Duke Salvia, MD, [facility]  Electronically Signed by Ronnell Clinger [place] M.D. on 11/09/2010 02:15:48 PM

## 2010-11-17 ENCOUNTER — Encounter: Payer: Self-pay | Admitting: Internal Medicine

## 2010-11-17 ENCOUNTER — Ambulatory Visit (INDEPENDENT_AMBULATORY_CARE_PROVIDER_SITE_OTHER): Payer: Medicare Other | Admitting: Internal Medicine

## 2010-11-17 DIAGNOSIS — I214 Non-ST elevation (NSTEMI) myocardial infarction: Secondary | ICD-10-CM

## 2010-11-17 DIAGNOSIS — I442 Atrioventricular block, complete: Secondary | ICD-10-CM

## 2010-11-17 DIAGNOSIS — Z950 Presence of cardiac pacemaker: Secondary | ICD-10-CM

## 2010-11-17 DIAGNOSIS — I10 Essential (primary) hypertension: Secondary | ICD-10-CM

## 2010-11-17 DIAGNOSIS — I472 Ventricular tachycardia: Secondary | ICD-10-CM

## 2010-11-17 NOTE — Progress Notes (Signed)
  HPI  Jamie [name] is a 77 y.o. female  seen in followup for complete heart block for which she is status post pacemaker implantation which is programmed in the VDD mode. She underwent device generator replacement fall 2010  She was recently hospitalized for chest pain in the setting of leukocytosis; her troponins were elevated and she underwent catheterization demonstrating no obstructive coronary disease. (August 2012)  The patient denies SOB, chest pain edema or palpitations.  There has been no syncope or presyncope.    Echo 2009 demonstrated Left ventricular ejection fraction was estimated to be 60 %.  - There was mild mitral annular calcification. There was mild  mitral valvular regurgitation.  Past Medical History  Diagnosis Date  . Complete AV block   . Presence of permanent cardiac pacemaker   . Tachycardia   . Hypertension   . Osteopenia   . Osteoarthritis   . COPD (chronic obstructive pulmonary disease)     Past Surgical History  Procedure Date  . Hernia repair 11/2002  . Eye surgery 10/2004  . Pacemaker placement     Medtronic    Current Outpatient Prescriptions  Medication Sig Dispense Refill  . albuterol (PROAIR HFA) 108 (90 BASE) MCG/ACT inhaler Inhale 2 puffs into the lungs as needed.        . ALPRAZolam (XANAX) 0.5 MG tablet Take 0.5 mg by mouth at bedtime as needed.        Marland Kitchen aspirin 81 MG tablet Take 81 mg by mouth daily.        Marland Kitchen atorvastatin (LIPITOR) 10 MG tablet Take 10 mg by mouth daily.        . Calcium Carbonate (CALTRATE 600) 1500 MG TABS Take 1 tablet by mouth daily.        . cholecalciferol (VITAMIN D) 1000 UNITS tablet Take 1,000 Units by mouth daily.        . COD LIVER OIL PO Take by mouth daily.        . Garlic Oil 1000 MG CAPS Take 1 capsule by mouth once a week.        . Glucosamine-Chondroit-Vit C-Mn (GLUCOSAMINE CHONDROITIN COMPLX) CAPS Take 1 capsule by mouth daily.        . metoprolol (LOPRESSOR) 50 MG tablet Take 50 mg by mouth 2 (two)  times daily.        . Misc Natural Products (OSTEO BI-FLEX ADV JOINT SHIELD PO) Take 2 capsules by mouth daily.        . Multiple Vitamins-Minerals (EYE VITAMINS PO) Take by mouth daily.        . multivitamin-iron-minerals-folic acid (CENTRUM) chewable tablet Chew 1 tablet by mouth daily.        . Omega-3 Fatty Acids (FISH OIL) 1200 MG CAPS Take 1 capsule by mouth 2 (two) times daily.          Allergies  Allergen Reactions  . Hydrocodone-Acetaminophen     REACTION: GI distress    Review of Systems negative except from HPI and PMH  Physical Exam Well developed and well nourished in no acute distress HENT normal E scleral and icterus clear Device pocket well-healed Neck Supple JVP flat; carotids brisk and full Clear to ausculation Regular rate and rhythm, no murmurs gallops or rub Soft with active bowel sounds No clubbing cyanosis and edema Alert and oriented, grossly normal motor and sensory function Skin Warm and Dry  Assessment and  Plan

## 2010-11-17 NOTE — Assessment & Plan Note (Signed)
The patient's device was interrogated.  The information was reviewed. No changes were made in the programming.    

## 2010-11-17 NOTE — Assessment & Plan Note (Signed)
In the absence of obstructive coronary disease, I would assume this is type II. Will discontinue her Lipitor

## 2010-11-17 NOTE — Assessment & Plan Note (Signed)
No recurrent VT 

## 2010-11-17 NOTE — Assessment & Plan Note (Signed)
Device dependent. 

## 2010-11-17 NOTE — Assessment & Plan Note (Signed)
She has white coat hypertension. Blood pressures at home are in the 120s.

## 2010-11-17 NOTE — Patient Instructions (Signed)
Your physician has recommended you make the following change in your medication:  1) Stop Aspirin. 2) Stop Lipitor.  Your physician wants you to follow-up in: 6 months with Kristin/Paula for a device check & 1 year with Dr. Graciela Husbands. You will receive a reminder letter in the mail two months in advance. If you don't receive a letter, please call our office to schedule the follow-up appointment.

## 2010-11-26 NOTE — H&P (Signed)
Jamie [name]:  [id number]  MEDICAL RECORD NO.:  [id number]  LOCATION:  2112                         FACILITY:  [facility]  PHYSICIAN:  Harlon Flor, MD   DATE OF BIRTH:  [birth year]  DATE OF ADMISSION:  11/04/2010 DATE OF DISCHARGE:                             HISTORY & PHYSICAL   ADMISSION DIAGNOSES: 1. Acute coronary syndrome/non-ST-elevation myocardial infarction. 2. Urinary tract infection.  CHIEF COMPLAINT:  Chest pain.  HISTORY OF PRESENT ILLNESS:  Jamie [name] is a pleasant 77 year old white female with a history of complete heart block status post dual-chamber pacemaker who presented to the emergency room tonight with nonspecific symptoms since 7 a.m.  She stated she had been feeling somewhat lightheaded and dizzy as well as having a headache.  She also had a stomach ache and rumbling in her stomach.  She did feel like she had some chills earlier today, but no documented fever.  She also stated her face felt numb.  Upon reaching the ER, some of these symptoms had improved, however, she developed left-sided axillary pain.  Her point-of- care troponin was mildly elevated and I was consulted in the emergency room.  Upon my evaluation, she had had approximately 5/10 pain in the axillary area for 3 hours and her troponin has increased over the last 3 hours.  She received one sublingual nitroglycerin and her pain was completely relieved within 4 minutes.  She now feels much better and is not having any other symptoms.  She was found to have abnormal urinalysis with leukocytes and a small amount of bacteria and was given 1 g of IV Rocephin  PAST MEDICAL HISTORY: 1. Complete heart block in 2002, status post dual chamber pacemaker     Medtronic. 2. Hypertension. 3. Asthma. 4. Osteoarthritis. 5. Osteopenia. 6. Cataracts. 7. Hypertension.  ALLERGIES:  None.  MEDICATIONS: 1. Metoprolol 50 mg b.i.d. 2. Albuterol as needed. 3. Calcium  supplementation daily. 4. Fish oil supplementation daily. 5. Aspirin 81 mg daily. 6. Osteo Bi-Flex oral daily.  SOCIAL HISTORY:  She has a very remote use of tobacco, but has not used tobacco in years.  She lives alone and is very independent.  She has 2 daughters that are with her tonight.  FAMILY HISTORY:  There is no early coronary artery disease.  REVIEW OF SYSTEMS:  Full review systems is negative except as stated in the HPI.  PHYSICAL EXAMINATION:  VITAL SIGNS:  Blood pressure 141/86, pulse 72, respirations 18, and temperature 98.5. GENERAL:  No acute distress. HEENT:  Extraocular movements are intact.  Oropharynx is benign. Nonicteric sclerae. NECK:  Supple. CARDIOVASCULAR:  Regular rate and rhythm without murmurs.  There is a split S2.  No jugular venous distention. LUNGS:  Clear to auscultation bilaterally. ABDOMEN:  Soft, nontender, mildly hyperactive bowel sounds. EXTREMITIES:  No edema.  Pulses are intact throughout. NEURO:  Grossly afocal.  She moves all extremities well and strength appears intact.  Her cranial nerves are grossly intact. SKIN:  No rashes. LYMPH:  No lymphadenopathy.  Chest x-ray is clear as well as plain films of the abdomen.  EKG shows normal sinus rhythm with ventricular pacing tracking sinus.  Hemoglobin is 15 and white count 8.7.  Complete metabolic panel was normal.  Her initial point-of-care troponin was 0.08 and repeat troponin is 0.80.  CK- MB is 3.6 and CK is 53.  ASSESSMENT/PLAN:  Jamie [name] is an 77 year old white female with dual- chamber pacemaker after heart block in 2002 who is here with atypical chest pain and an increasing troponin consistent with a small non-ST- elevation myocardial infarction.  Her pain is now relieved. 1. Acute coronary syndrome/non-ST-elevation myocardial infarction:     Aspirin 325 and heparin drip.  We will continue metoprolol 50     b.i.d.  We will check her lipids and we will also check a 2-D      echocardiogram in the morning.  Depending on how she does, we will     pursue conservative versus invasive management with left heart     catheterization.  We will keep her n.p.o. after midnight. 2. Possible urinary tract infection:  The patient was given Rocephin 1     g in the ER due to her leukorrhea and reported chills from earlier     in the day.  Urine culture was not obtained prior to this dose of     Rocephin.  We will check urine culture now and continue Rocephin 1     g daily while in the hospital.     Harlon Flor, MD     MMB/MEDQ  D:  11/04/2010  T:  11/05/2010  Job:  [id number]  cc:   Loraine Leriche A. Perini, M.D.  Electronically Signed by Meridee Score MD on 11/26/2010 08:09:06 PM

## 2010-11-27 NOTE — Discharge Summary (Signed)
Jamie [name], Jamie [name]:  [id number]  MEDICAL RECORD NO.:  [id number]  LOCATION:  3710                         FACILITY:  [facility]  PHYSICIAN:  Peter M. [place], M.D.  DATE OF BIRTH:  08/28/1922  DATE OF ADMISSION:  11/04/2010 DATE OF DISCHARGE:  11/06/2010                              DISCHARGE SUMMARY   PRIMARY CARDIOLOGIST:  Duke Salvia, MD, [facility]  DISCHARGE DIAGNOSIS:  Non-ST-segment elevation myocardial infarction.  SECONDARY DIAGNOSES: 1. Nonobstructive coronary artery disease by catheterization this     admission. 2. Complete heart block status post dual-chamber permanent pacemaker     2002. 3. Hypertension. 4. Hyperlipidemia. 5. Asthma. 6. Osteoarthritis. 7. Osteopenia. 8. Cataracts.  ALLERGIES:  No known drug allergies.  PROCEDURES: 1. Left heart catheterization performed November 06, 2010 revealing     diffuse nonobstructive CAD with an EF of 50-55%. 2. Abdominal films showing no acute finding in the chest or abdomen     November 04, 2010. 3. November 05, 2010, 2-D echocardiogram, EF 65-70%, grade 2 diastolic     dysfunction, trivial AI, mild MR, moderate TR.  HISTORY OF PRESENT ILLNESS:  A 77 year old female with prior history complete heart block status post permanent pacemaker placement who presented to the [facility] ED on evening of November 04, 2010 with complaints of lightheadedness, dizziness, headache, epigastric discomfort, chills without fever, facial numbness, and left axillary pain.  Evaluated by ER staff and cardiac enzymes were sent off and returned with a troponin of 0.80.  At that point Cardiology was consulted for further evaluation and admission.  Of note, urinalysis in the ER showed moderate leukocytes, 11-20 WBCs, and rare bacteria.  The patient was initiated on Rocephin therapy and a culture was sent off.  HOSPITAL COURSE:  The patient's CKs and MBs remained normal while the troponin trended down following admission.  On  the morning of August 23, the patient was without complaint.  Urine culture showed greater than 100,000 colonies of multiple bacterial morphotypes which was felt to be consistent with contamination.  The patient had no fever and white blood cell count was within normal limits.  Because of positive cardiac markers, decision was made to pursue cardiac catheterization which took place this morning revealing nonobstructive CAD and normal LV function.  We plan to discharge Jamie [name] later this afternoon after ambulation.  We have discontinued antibiotics in light of her urine culture.  DISCHARGE LABS:  Hemoglobin 14.5, hematocrit 43.0, WBC 8.3, platelets 172,000.  Sodium 139, potassium 4.4, chloride 104, CO2 of 30, BUN 12, creatinine 0.75, glucose 98, total bilirubin 0.4, alkaline phosphatase 83, AST 35, ALT 33, total protein 6.4, albumin 3.6, calcium 9.0, lipase 44, CK 52, MB, 3.6, troponin I 0.49, total cholesterol 156, triglycerides 50, HDL 55, LDL 91.  MRSA screen was negative.  Urine culture showed greater than 100,000 colonies of multiple bacterial morphotypes.  DISPOSITION:  The patient will be discharged home today in good condition.  FOLLOWUP PLANS AND APPOINTMENTS:  The patient's followup is scheduled with Dr. Graciela Husbands on September 4 at 11:30 a.m.  She will follow up with primary provider, Dr. Waynard Edwards as previously scheduled.  DISCHARGE MEDICATIONS: 1.  Lipitor 10 mg at bedtime. 2. Nitroglycerin 0.4 mg sublingual p.r.n. chest pain. 3. Alprazolam 0.5 mg p.r.n. 4. Aspirin 81 mg daily. 5. Calcium over-the-counter 1 tablet daily. 6. Cod liver oil 1 cap daily. 7. Multivitamin 1 tab daily. 8. Metoprolol 50 mg b.i.d. 9. ProAir inhaler 2 puffs as needed. 10.Tylenol 325 mg 1-2 tablets q.6 h. p.r.n. 11.Fish oil over-the-counter 1 tab daily. 12.Osteo Bi-Flex 1 tablet daily.  OUTSTANDING LABS AND STUDIES:  None.  DURATION OF DISCHARGE ENCOUNTER:  40 minutes including physician  time.     Nicolasa Ducking, ANP   ______________________________ Peter M. [place], M.D.    CB/MEDQ  D:  11/06/2010  T:  11/06/2010  Job:  [id number]  cc:   Loraine Leriche A. Perini, M.D.  Electronically Signed by Nicolasa Ducking ANP on 11/26/2010 03:15:44 PM Electronically Signed by PETER [place] M.D. on 11/27/2010 05:10:37 PM

## 2010-12-11 LAB — CBC
HCT: 41.8
HCT: 44.6
Hemoglobin: 14.9
MCHC: 33.5
MCV: 99.4
MCV: 99.5
Platelets: 184
Platelets: 190
RBC: 4.21
RBC: 4.48
RDW: 13.1
RDW: 13.5
WBC: 5.8
WBC: 6.8

## 2010-12-11 LAB — COMPREHENSIVE METABOLIC PANEL WITH GFR
ALT: 20
Alkaline Phosphatase: 71
BUN: 19
CO2: 28
Chloride: 103
GFR calc non Af Amer: >60
Glucose, Bld: 99
Potassium: 4.7
Sodium: 138
Total Bilirubin: 0.9
Total Protein: 6.2

## 2010-12-11 LAB — URINE MICROSCOPIC-ADD ON

## 2010-12-11 LAB — DIFFERENTIAL
Basophils Absolute: 0.1
Basophils Absolute: 0.1
Basophils Relative: 1
Eosinophils Absolute: 0.1
Eosinophils Absolute: 0.2
Eosinophils Relative: 2
Lymphocytes Relative: 20
Lymphocytes Relative: 31
Lymphs Abs: 1.4
Lymphs Abs: 1.9
Monocytes Absolute: 0.4
Monocytes Relative: 10
Monocytes Relative: 6
Neutro Abs: 4.3
Neutro Abs: 4.8
Neutrophils Relative %: 53
Neutrophils Relative %: 56
Neutrophils Relative %: 70

## 2010-12-11 LAB — BASIC METABOLIC PANEL
BUN: 14
Chloride: 101
Chloride: 103
Creatinine, Ser: 0.8
Creatinine, Ser: 0.82
GFR calc Af Amer: >60
Glucose, Bld: 93
Potassium: 4.4

## 2010-12-11 LAB — URINALYSIS, ROUTINE W REFLEX MICROSCOPIC
Bilirubin Urine: NEGATIVE
Glucose, UA: NEGATIVE
Ketones, ur: 15 — AB
Nitrite: NEGATIVE
Protein, ur: NEGATIVE
Specific Gravity, Urine: 1.01
Urobilinogen, UA: 0.2
pH: 5

## 2010-12-11 LAB — COMPREHENSIVE METABOLIC PANEL
AST: 24
Albumin: 3.6
Calcium: 8.8
Creatinine, Ser: 0.87
GFR calc Af Amer: >60

## 2010-12-11 LAB — POCT CARDIAC MARKERS
Myoglobin, poc: 82.1
Troponin i, poc: <0.05

## 2010-12-11 LAB — CARDIAC PANEL(CRET KIN+CKTOT+MB+TROPI)
CK, MB: 1.5
CK, MB: 1.5
Total CK: 65
Total CK: 69
Troponin I: 0.01
Troponin I: 0.02

## 2010-12-11 LAB — B-NATRIURETIC PEPTIDE (CONVERTED LAB): Pro B Natriuretic peptide (BNP): 70

## 2010-12-11 LAB — D-DIMER, QUANTITATIVE: D-Dimer, Quant: 0.46

## 2011-08-16 ENCOUNTER — Encounter (HOSPITAL_COMMUNITY): Payer: Self-pay

## 2011-08-16 ENCOUNTER — Emergency Department (HOSPITAL_COMMUNITY): Payer: Medicare Other

## 2011-08-16 ENCOUNTER — Observation Stay (HOSPITAL_COMMUNITY)
Admission: EM | Admit: 2011-08-16 | Discharge: 2011-08-17 | Disposition: A | Discharge status: Home / Self Care | Payer: Medicare Other | Attending: Internal Medicine | Admitting: Internal Medicine

## 2011-08-16 ENCOUNTER — Inpatient Hospital Stay (HOSPITAL_COMMUNITY): Payer: Medicare Other

## 2011-08-16 DIAGNOSIS — R000 Tachycardia, unspecified: Secondary | ICD-10-CM | POA: Insufficient documentation

## 2011-08-16 DIAGNOSIS — J439 Emphysema, unspecified: Secondary | ICD-10-CM | POA: Insufficient documentation

## 2011-08-16 DIAGNOSIS — J4489 Other specified chronic obstructive pulmonary disease: Secondary | ICD-10-CM | POA: Insufficient documentation

## 2011-08-16 DIAGNOSIS — R109 Unspecified abdominal pain: Secondary | ICD-10-CM | POA: Diagnosis not present

## 2011-08-16 DIAGNOSIS — R209 Unspecified disturbances of skin sensation: Secondary | ICD-10-CM | POA: Diagnosis not present

## 2011-08-16 DIAGNOSIS — R110 Nausea: Secondary | ICD-10-CM | POA: Insufficient documentation

## 2011-08-16 DIAGNOSIS — R079 Chest pain, unspecified: Secondary | ICD-10-CM | POA: Diagnosis not present

## 2011-08-16 DIAGNOSIS — R42 Dizziness and giddiness: Secondary | ICD-10-CM | POA: Insufficient documentation

## 2011-08-16 DIAGNOSIS — I10 Essential (primary) hypertension: Secondary | ICD-10-CM | POA: Diagnosis not present

## 2011-08-16 DIAGNOSIS — I442 Atrioventricular block, complete: Secondary | ICD-10-CM | POA: Insufficient documentation

## 2011-08-16 DIAGNOSIS — N390 Urinary tract infection, site not specified: Secondary | ICD-10-CM | POA: Diagnosis not present

## 2011-08-16 DIAGNOSIS — J449 Chronic obstructive pulmonary disease, unspecified: Secondary | ICD-10-CM | POA: Diagnosis not present

## 2011-08-16 DIAGNOSIS — I6789 Other cerebrovascular disease: Secondary | ICD-10-CM | POA: Diagnosis not present

## 2011-08-16 DIAGNOSIS — I214 Non-ST elevation (NSTEMI) myocardial infarction: Secondary | ICD-10-CM

## 2011-08-16 DIAGNOSIS — Z950 Presence of cardiac pacemaker: Secondary | ICD-10-CM | POA: Insufficient documentation

## 2011-08-16 DIAGNOSIS — N3000 Acute cystitis without hematuria: Secondary | ICD-10-CM | POA: Diagnosis not present

## 2011-08-16 DIAGNOSIS — R0602 Shortness of breath: Secondary | ICD-10-CM | POA: Diagnosis not present

## 2011-08-16 DIAGNOSIS — I369 Nonrheumatic tricuspid valve disorder, unspecified: Secondary | ICD-10-CM

## 2011-08-16 DIAGNOSIS — M1990 Unspecified osteoarthritis, unspecified site: Secondary | ICD-10-CM | POA: Insufficient documentation

## 2011-08-16 DIAGNOSIS — I951 Orthostatic hypotension: Secondary | ICD-10-CM

## 2011-08-16 HISTORY — DX: Gastro-esophageal reflux disease without esophagitis: K21.9

## 2011-08-16 HISTORY — DX: Shortness of breath: R06.02

## 2011-08-16 HISTORY — DX: Pneumonia, unspecified organism: J18.9

## 2011-08-16 HISTORY — DX: Presence of cardiac pacemaker: Z95.0

## 2011-08-16 LAB — CARDIAC PANEL(CRET KIN+CKTOT+MB+TROPI)
CK, MB: 2 ng/mL (ref 0.3–4.0)
Relative Index: INVALID (ref 0.0–2.5)
Relative Index: INVALID (ref 0.0–2.5)
Relative Index: INVALID (ref 0.0–2.5)
Troponin I: <0.3 ng/mL (ref <0.30)
Troponin I: <0.3 ng/mL (ref <0.30)

## 2011-08-16 LAB — CREATININE, SERUM
Creatinine, Ser: 0.81 mg/dL (ref 0.50–1.10)
GFR calc Af Amer: 74 mL/min — ABNORMAL LOW (ref >90)

## 2011-08-16 LAB — CBC
HCT: 44.6 % (ref 36.0–46.0)
Hemoglobin: 15.5 g/dL — ABNORMAL HIGH (ref 12.0–15.0)
MCH: 32.7 pg (ref 26.0–34.0)
Platelets: 201 10*3/uL (ref 150–400)
RBC: 4.72 MIL/uL (ref 3.87–5.11)
RBC: 4.62 MIL/uL (ref 3.87–5.11)
WBC: 10.3 10*3/uL (ref 4.0–10.5)
WBC: 8.3 10*3/uL (ref 4.0–10.5)

## 2011-08-16 LAB — POCT I-STAT TROPONIN I: Troponin i, poc: 0 ng/mL (ref 0.00–0.08)

## 2011-08-16 LAB — POCT I-STAT, CHEM 8
Calcium, Ion: 1.16 mmol/L (ref 1.12–1.32)
Hemoglobin: 16.7 g/dL — ABNORMAL HIGH (ref 12.0–15.0)
Sodium: 137 mEq/L (ref 135–145)
TCO2: 29 mmol/L (ref 0–100)

## 2011-08-16 LAB — COMPREHENSIVE METABOLIC PANEL
ALT: 20 U/L (ref 0–35)
Alkaline Phosphatase: 89 U/L (ref 39–117)
BUN: 19 mg/dL (ref 6–23)
CO2: 27 mEq/L (ref 19–32)
Chloride: 98 mEq/L (ref 96–112)
GFR calc Af Amer: 74 mL/min — ABNORMAL LOW (ref >90)
Glucose, Bld: 98 mg/dL (ref 70–99)
Potassium: 4.7 mEq/L (ref 3.5–5.1)
Total Bilirubin: 0.6 mg/dL (ref 0.3–1.2)

## 2011-08-16 LAB — URINALYSIS, ROUTINE W REFLEX MICROSCOPIC
Glucose, UA: NEGATIVE mg/dL
Ketones, ur: NEGATIVE mg/dL
Specific Gravity, Urine: <1.005 — ABNORMAL LOW (ref 1.005–1.030)
pH: 6 (ref 5.0–8.0)

## 2011-08-16 LAB — URINE MICROSCOPIC-ADD ON

## 2011-08-16 MED ORDER — PANTOPRAZOLE SODIUM 40 MG PO TBEC
40.0000 mg | DELAYED_RELEASE_TABLET | Freq: Two times a day (BID) | ORAL | Status: DC
  Administered 2011-08-16 – 2011-08-17 (×2): 40 mg via ORAL
  Filled 2011-08-16 (×2): qty 1

## 2011-08-16 MED ORDER — GUAIFENESIN-DM 100-10 MG/5ML PO SYRP: 5.0000 mL | ORAL_SOLUTION | ORAL | Status: DC | PRN

## 2011-08-16 MED ORDER — ENOXAPARIN SODIUM 40 MG/0.4ML ~~LOC~~ SOLN
40.0000 mg | SUBCUTANEOUS | Status: DC
  Administered 2011-08-16: 40 mg via SUBCUTANEOUS
  Filled 2011-08-16 (×2): qty 0.4

## 2011-08-16 MED ORDER — ONDANSETRON HCL 4 MG/2ML IJ SOLN
INTRAMUSCULAR | Status: AC
  Administered 2011-08-16: 11:00:00
  Filled 2011-08-16: qty 2

## 2011-08-16 MED ORDER — SODIUM CHLORIDE 0.9 % IV SOLN: 250.0000 mL | INTRAVENOUS | Status: DC | PRN

## 2011-08-16 MED ORDER — LORATADINE 10 MG PO TABS
10.0000 mg | ORAL_TABLET | Freq: Every day | ORAL | Status: DC
  Administered 2011-08-17: 10 mg via ORAL
  Filled 2011-08-16: qty 1

## 2011-08-16 MED ORDER — SODIUM CHLORIDE 0.9 % IJ SOLN
3.0000 mL | Freq: Two times a day (BID) | INTRAMUSCULAR | Status: DC
  Administered 2011-08-16: 3 mL via INTRAVENOUS

## 2011-08-16 MED ORDER — VITAMIN D3 25 MCG (1000 UNIT) PO TABS
1000.0000 [IU] | ORAL_TABLET | Freq: Every day | ORAL | Status: DC
  Administered 2011-08-17: 1000 [IU] via ORAL
  Filled 2011-08-16: qty 1

## 2011-08-16 MED ORDER — FUROSEMIDE 10 MG/ML IJ SOLN
20.0000 mg | Freq: Once | INTRAMUSCULAR | Status: AC
  Administered 2011-08-16: 20 mg via INTRAVENOUS
  Filled 2011-08-16: qty 2

## 2011-08-16 MED ORDER — ONDANSETRON HCL 4 MG/2ML IJ SOLN: 4.0000 mg | Freq: Four times a day (QID) | INTRAMUSCULAR | Status: DC | PRN

## 2011-08-16 MED ORDER — SODIUM CHLORIDE 0.9 % IJ SOLN: 3.0000 mL | INTRAMUSCULAR | Status: DC | PRN

## 2011-08-16 MED ORDER — METOPROLOL TARTRATE 50 MG PO TABS
50.0000 mg | ORAL_TABLET | Freq: Two times a day (BID) | ORAL | Status: DC
  Administered 2011-08-16 – 2011-08-17 (×2): 50 mg via ORAL
  Filled 2011-08-16 (×3): qty 1

## 2011-08-16 MED ORDER — ASPIRIN EC 81 MG PO TBEC
81.0000 mg | DELAYED_RELEASE_TABLET | Freq: Every day | ORAL | Status: DC
  Administered 2011-08-17: 81 mg via ORAL
  Filled 2011-08-16: qty 1

## 2011-08-16 MED ORDER — GI COCKTAIL ~~LOC~~
30.0000 mL | Freq: Once | ORAL | Status: AC
  Administered 2011-08-16: 30 mL via ORAL
  Filled 2011-08-16: qty 30

## 2011-08-16 MED ORDER — ALBUTEROL SULFATE (5 MG/ML) 0.5% IN NEBU: 2.5000 mg | INHALATION_SOLUTION | RESPIRATORY_TRACT | Status: DC | PRN

## 2011-08-16 MED ORDER — DEXTROSE 5 % IV SOLN
1.0000 g | INTRAVENOUS | Status: DC
  Administered 2011-08-16: 1 g via INTRAVENOUS
  Filled 2011-08-16 (×2): qty 10

## 2011-08-16 MED ORDER — ACETAMINOPHEN 650 MG RE SUPP: 650.0000 mg | Freq: Four times a day (QID) | RECTAL | Status: DC | PRN

## 2011-08-16 MED ORDER — ALPRAZOLAM 0.5 MG PO TABS: 0.5000 mg | ORAL_TABLET | Freq: Every evening | ORAL | Status: DC | PRN

## 2011-08-16 MED ORDER — ACETAMINOPHEN 325 MG PO TABS: 650.0000 mg | ORAL_TABLET | Freq: Four times a day (QID) | ORAL | Status: DC | PRN

## 2011-08-16 MED ORDER — MORPHINE SULFATE 2 MG/ML IJ SOLN: 2.0000 mg | INTRAMUSCULAR | Status: DC | PRN

## 2011-08-16 MED ORDER — OMEGA-3-ACID ETHYL ESTERS 1 G PO CAPS
1.0000 g | ORAL_CAPSULE | Freq: Two times a day (BID) | ORAL | Status: DC
  Administered 2011-08-16 – 2011-08-17 (×2): 1 g via ORAL
  Filled 2011-08-16 (×3): qty 1

## 2011-08-16 NOTE — ED Notes (Signed)
Per EMS: Nauseas, SOB, weak, shaky. Tingling and numbness in both legs. Denies chest pain. Pt. Has pacemaker, hxt of asthma. 2 Adult aspirin prior to EMS arrival. EMS gave 4mg  Zofran.

## 2011-08-16 NOTE — ED Provider Notes (Signed)
History     CSN: [id number]  Arrival date & time 08/16/11  0821   First MD Initiated Contact with Patient 08/16/11 0827      No chief complaint on file.   HPI Comments: 78 yo female with history of heart block with ventricular pacemaker, NSTEMI in 8/12 presents with acute onset lightheadedness, nausea, shortness of breath and tingling in legs. Began at 6 am this morning just before breakfast. She drank some orange juice and chewed two aspirin. Helped breathing but symptoms persisted so she called daughter who called EMS. Zofran given without help. Prior to this episode feeling well except for some seasonal allergies and taking allegra. States she has similar symptoms when she was admitted in August, had NSTEMI and catheterization showed nonobstructive lesions.   Denies any chest pain, palpitations, wheezing, rash, LE edema, emesis, diarrhea, indigestion, abdominal pain.   Past Medical History  Diagnosis Date  . Complete AV block   . Presence of permanent cardiac pacemaker   . Tachycardia   . Hypertension   . Osteopenia   . Osteoarthritis   . COPD (chronic obstructive pulmonary disease)     Past Surgical History  Procedure Date  . Hernia repair 11/2002  . Eye surgery 10/2004  . Pacemaker placement     [employer]    History reviewed. No pertinent family history.  History  Substance Use Topics  . Smoking status: Former Smoker    Quit date: 03/16/1987  . Smokeless tobacco: Not on file  . Alcohol Use: No    OB History    Grav Para Term Preterm Abortions TAB SAB Ect Mult Living                  Review of Systems  Allergies  Hydrocodone-acetaminophen  Home Medications   Current Outpatient Rx  Name Route Sig Dispense Refill  . ALBUTEROL SULFATE HFA 108 (90 BASE) MCG/ACT IN AERS Inhalation Inhale 2 puffs into the lungs as needed.      . ALPRAZOLAM 0.5 MG PO TABS Oral Take 0.5 mg by mouth at bedtime as needed.      Marland Kitchen CALCIUM CARBONATE 1500 MG PO TABS Oral Take 1 tablet  by mouth daily.      Marland Kitchen VITAMIN D 1000 UNITS PO TABS Oral Take 1,000 Units by mouth daily.      . COD LIVER OIL PO Oral Take by mouth daily.      Marland Kitchen GARLIC OIL 1000 MG PO CAPS Oral Take 1 capsule by mouth once a week.      Marland Kitchen GLUCOSAMINE CHONDROITIN COMPLX PO CAPS Oral Take 1 capsule by mouth daily.      Marland Kitchen METOPROLOL TARTRATE 50 MG PO TABS Oral Take 50 mg by mouth 2 (two) times daily.      . OSTEO BI-FLEX ADV JOINT SHIELD PO Oral Take 2 capsules by mouth daily.      Marland Kitchen EYE VITAMINS PO Oral Take by mouth daily.      . CENTRUM PO CHEW Oral Chew 1 tablet by mouth daily.      Marland Kitchen FISH OIL 1200 MG PO CAPS Oral Take 1 capsule by mouth 2 (two) times daily.        BP 170/75  Pulse 61  Temp(Src) 97.7 F (36.5 C) (Oral)  Resp 26  SpO2 96%  Physical Exam  Vitals reviewed. Constitutional: She is oriented to person, place, and time. She appears well-developed and well-nourished. No distress.       Sitting in bed,  no distress. Normal speech and pleasant.  HENT:  Head: Normocephalic and atraumatic.  Mouth/Throat: Oropharynx is clear and moist.  Eyes: EOM are normal. Pupils are equal, round, and reactive to light.  Neck: Normal range of motion. Neck supple. No JVD present.  Cardiovascular: Normal rate, regular rhythm and normal heart sounds.  Exam reveals no gallop.   Pulmonary/Chest: Effort normal and breath sounds normal. No respiratory distress. She has no wheezes.  Abdominal: Soft. She exhibits no distension. There is no rebound and no guarding.       Hyperactive bowel sounds. Mild diffuse TTP in LUQ and epigastrium.  Musculoskeletal: She exhibits no edema and no tenderness.  Lymphadenopathy:    She has no cervical adenopathy.  Neurological: She is alert and oriented to person, place, and time. She exhibits normal muscle tone. Coordination normal.  Skin: No rash noted.  Psychiatric: She has a normal mood and affect.    ED Course  Procedures (including critical care time)  Labs Reviewed  PRO  B NATRIURETIC PEPTIDE - Abnormal; Notable for the following:    Pro B Natriuretic peptide (BNP) 837.0 (*)    All other components within normal limits  URINALYSIS, ROUTINE W REFLEX MICROSCOPIC - Abnormal; Notable for the following:    Specific Gravity, Urine <1.005 (*)    Hgb urine dipstick MODERATE (*)    Leukocytes, UA SMALL (*)    All other components within normal limits  CBC - Abnormal; Notable for the following:    Hemoglobin 15.5 (*)    All other components within normal limits  COMPREHENSIVE METABOLIC PANEL - Abnormal; Notable for the following:    GFR calc non Af Amer 63 (*)    GFR calc Af Amer 74 (*)    All other components within normal limits  POCT I-STAT, CHEM 8 - Abnormal; Notable for the following:    Hemoglobin 16.7 (*)    HCT 49.0 (*)    All other components within normal limits  POCT I-STAT TROPONIN I  URINE MICROSCOPIC-ADD ON   Dg Chest Portable 1 View  08/16/2011  *RADIOLOGY REPORT*  Clinical Data: Chest pain, shortness of breath  PORTABLE CHEST - 1 VIEW  Comparison: 01/20/2009  Findings: Left subclavian pacemaker stable.  Atheromatous thoracic aorta.  Heart size upper limits normal.  Stable mild thoracic dextroscoliosis.  Some increase in prominence of perihilar and bibasilar interstitial markings suggesting early pulmonary vascular congestion.  No definite effusion.  IMPRESSION:  1.  Question early pulmonary vascular congestion.  Original Report Authenticated By: Osa Craver, M.D.     No diagnosis found.  EKG: 60 bpm, vent paced rhythm  MDM  78 yo with heart block, pacemaker presents with acute onset nausea, weakness, dyspnea, shakiness. Symptoms similar to previous hospitalization for NSTEMI in 8/12, also suspicious for GI etiology. Check Trop, LFTs, CBC, glucose, lytes, UA, CXR to eval for occult infectious source. Trial of GI cocktail.  Patient symptomatic only with ambulation-leg numbness, dyspnea, shakiness. GI cocktail relieved the discomfort in  abdomen, nausea. Await cardiac markers as cannot rule out anginal equivalent. Orthostatic vitals as BP is fluctuating.   Patient having persistant lightheadedness, leg numbness, weakness with standing/ambulation. Also c/o some trouble breathing. With concern of new CHF, elevated BNP, vascular congestion and positive orthostatics, have consulted Medicine for admission for cardiac rule out and possible medication adjustment. Initial trop negative.  Case d/w Dr. Jeraldine Loots.       Durwin Reges, MD 08/16/11 1234

## 2011-08-16 NOTE — Progress Notes (Signed)
  Echocardiogram 2D Echocardiogram has been performed.  Jamie [name] 08/16/2011, 4:11 PM

## 2011-08-16 NOTE — ED Notes (Signed)
Pt. Reports weakness, dizziness, SOB, and tingling down both legs. Started this morning when she got out of bed.  Pt. Reports using her inhaler this morning with no relief and taking 2 adult aspirin. Pt. Denies chest pain. Pt. Reports headaches on and off x 1 week, denies headache currently.

## 2011-08-16 NOTE — ED Notes (Addendum)
Pt. Reports tingling in her legs and feet bilaterally when sitting up. Pt. Reports feeling cold only when standing.

## 2011-08-16 NOTE — H&P (Signed)
History and Physical  Jamie [name] BJY:[id number] DOB: 12/23/1922 DOA: 08/16/2011  Referring physician: PCP: Ezequiel Kayser, MD, MD   Chief Complaint: Nausea and stomachache  HPI:  Patient is a 78 year old white female that presented to the emergency room secondary to nausea and stomach ache that started approximately 6 AM this morning. She also complained of some dizziness. She's had no vomiting. She also has some pain underneath her left armpit that has since resolved. In the emergency room patient had cardiac markers that were done that was normal. She had slightly elevated proBNP and her chest x-ray showed some mild vascular congestion. There were asked to admit her for further workup. Patient stated that when she had a non-ST elevation MI back in August of last year she had the same symptoms. She complains of some shortness of breath. She does have a history of COPD. Per her daughter she was given oxygen by her primary care physician at home but patient refused with oxygen. She complains of increased fatigue even just walking to the bathroom. She's had no fevers no headaches.  Chart Review:  Review of Systems:  10 point review of systems negative otherwise stated in history of present illness.  Past Medical History  Diagnosis Date  . Complete AV block   . Presence of permanent cardiac pacemaker   . Tachycardia   . Hypertension   . Osteopenia   . Osteoarthritis   . COPD (chronic obstructive pulmonary disease)     Past Surgical History  Procedure Date  . Hernia repair 11/2002  . Eye surgery 10/2004  . Pacemaker placement     [employer]    Social History: Reports that she quit smoking about 24 years ago. She does not have any smokeless tobacco history.. She reports that she does not drink alcohol or use illicit drugs.  She is widowed and has 3 daughters.  She is retired.  Allergies  Allergen Reactions  . Hydrocodone-Acetaminophen     REACTION: GI distress    History reviewed. No  pertinent family history.   Prior to Admission medications   Medication Sig Start Date End Date Taking? Authorizing Provider  albuterol (PROAIR HFA) 108 (90 BASE) MCG/ACT inhaler Inhale 2 puffs into the lungs every 6 (six) hours as needed. For shortness of breath/wheezing   Yes Historical Provider, MD  ALPRAZolam Prudy Feeler) 0.5 MG tablet Take 0.5 mg by mouth at bedtime as needed. For sleep   Yes Historical Provider, MD  Calcium Carbonate (CALTRATE 600) 1500 MG TABS Take 1 tablet by mouth daily.     Yes Historical Provider, MD  cholecalciferol (VITAMIN D) 1000 UNITS tablet Take 1,000 Units by mouth daily.     Yes Historical Provider, MD  Coenzyme Q10 (CO Q 10 PO) Take 1 capsule by mouth daily.   Yes Historical Provider, MD  Cyanocobalamin (VITAMIN B 12 PO) Take 1 tablet by mouth daily.   Yes Historical Provider, MD  Garlic Oil 1000 MG CAPS Take 1 capsule by mouth once a week.     Yes Historical Provider, MD  Glucosamine-Chondroit-Vit C-Mn (GLUCOSAMINE CHONDROITIN COMPLX) CAPS Take 1 capsule by mouth daily.     Yes Historical Provider, MD  loratadine (CLARITIN) 10 MG tablet Take 10 mg by mouth daily.   Yes Historical Provider, MD  MAGNESIUM PO Take 1 tablet by mouth 2 (two) times a week. No two days in particular   Yes Historical Provider, MD  metoprolol (LOPRESSOR) 50 MG tablet Take 50 mg by mouth 2 (two)  times daily.     Yes Historical Provider, MD  Misc Natural Products (OSTEO BI-FLEX ADV JOINT SHIELD PO) Take 2 capsules by mouth daily.     Yes Historical Provider, MD  Multiple Vitamins-Minerals (EYE VITAMINS PO) Take by mouth daily.     Yes Historical Provider, MD  multivitamin-iron-minerals-folic acid (CENTRUM) chewable tablet Chew 1 tablet by mouth daily.     Yes Historical Provider, MD  Omega-3 Fatty Acids (FISH OIL) 1200 MG CAPS Take 1 capsule by mouth 2 (two) times daily.     Yes Historical Provider, MD   Physical Exam: Filed Vitals:   08/16/11 1133 08/16/11 1139 08/16/11 1200 08/16/11  1330  BP: 136/58 155/63 101/48 121/64  Pulse: 66 68 59 59  Temp:      TempSrc:      Resp:   17 18  SpO2:   95% 91%     General:  Patient is sitting up on stretcher, she appears younger than her stated age. She is in no acute distress.  Eyes: Pupils reactive to light  ENT: Throat without erythema   Neck: Supple  Cardiovascular: Regular rate rhythm  Respiratory: Clear to auscultations bilaterally  Abdomen: Soft nontender nondistended positive bowel sounds   Skin: Intact  Musculoskeletal: Moves all extremity  Psychiatric: Normal mood Neurologic: No focal deficit Labs on Admission:  Basic Metabolic Panel:  Lab 08/16/11 4540 08/16/11 0913  NA 137 136  K 4.8 4.7  CL 101 98  CO2 -- 27  GLUCOSE 96 98  BUN 19 19  CREATININE 0.80 0.81  CALCIUM -- 9.1  MG -- --  PHOS -- --    Liver Function Tests:  Lab 08/16/11 0913  AST 26  ALT 20  ALKPHOS 89  BILITOT 0.6  PROT 6.6  ALBUMIN 3.5   CBC:  Lab 08/16/11 0937 08/16/11 0913  WBC -- 10.3  NEUTROABS -- --  HGB 16.7* 15.5*  HCT 49.0* 44.6  MCV -- 94.5  PLT -- 199    Cardiac Enzymes:  Lab 08/16/11 1242  CKTOTAL 42  CKMB 2.0  CKMBINDEX --  TROPONINI <0.30    Troponin (Point of Care Test)  [facility] 08/16/11 0934  TROPIPOC 0.00    BNP (last 3 results)  Basename 08/16/11 0913  PROBNP 837.0*    Radiological Exams on Admission: Dg Chest Portable 1 View  08/16/2011  *RADIOLOGY REPORT*  Clinical Data: Chest pain, shortness of breath  PORTABLE CHEST - 1 VIEW  Comparison: 01/20/2009  Findings: Left subclavian pacemaker stable.  Atheromatous thoracic aorta.  Heart size upper limits normal.  Stable mild thoracic dextroscoliosis.  Some increase in prominence of perihilar and bibasilar interstitial markings suggesting early pulmonary vascular congestion.  No definite effusion.  IMPRESSION:  1.  Question early pulmonary vascular congestion.  Original Report Authenticated By: Osa Craver, M.D.    EKG:  Independently reviewed. Unchanged since her EKG in August. She has T-wave inversions in aVL and Q waves in 2,3, aVF and poor R-wave progression.  Assessment/Plan  Nausea/elevated BNP Patient complains of nausea. Her liver function panel is normal. Will get x-ray of the abdomen. Patient has a mild urinary tract infection. We'll treat her with Rocephin IV. Will monitor symptoms. She states that this was equivalent to her non-ST elevation MI symptoms  the last time she was in the hospital back in August. So far her enzymes are normal. Will cycle cardiac markers and get a 2-D echo.  If cardiac markers elevated or 2-D echo abnormal  will consult her cardiologist Dr. Graciela Husbands. BNP slightly elevated and mild vascular congestion seen on chest x-ray. We'll give her one dose of Lasix. Will also check orthostatic vital signs.   Acute cystitis Rocephin IV and urine culture.  H/O NSTEMI in 10/2010 As per discussion in #1.  Orthostatic hypotension/hypertension Continue beta blocker. Blood pressure stable. Recheck orthostatic vital signs.   Code Status:  DO NOT RESUSCITATE per patient's wishes Family Communication:  Discussed treatment plan with patient and her daughter  Time spent doing this admission and talking with patient and family is approximately 50 minutes. Disposition Plan: when medically stable  Molli Posey, MD  [name] Pager [phone]  If 8PM-8AM, please contact floor/night-coverage at [email or web address], password [facility] 08/16/2011, 1:54 PM

## 2011-08-17 ENCOUNTER — Inpatient Hospital Stay (HOSPITAL_COMMUNITY): Payer: Medicare Other

## 2011-08-17 DIAGNOSIS — I10 Essential (primary) hypertension: Secondary | ICD-10-CM

## 2011-08-17 DIAGNOSIS — I6789 Other cerebrovascular disease: Secondary | ICD-10-CM | POA: Diagnosis not present

## 2011-08-17 DIAGNOSIS — N3000 Acute cystitis without hematuria: Secondary | ICD-10-CM

## 2011-08-17 DIAGNOSIS — R42 Dizziness and giddiness: Secondary | ICD-10-CM | POA: Diagnosis not present

## 2011-08-17 DIAGNOSIS — R110 Nausea: Secondary | ICD-10-CM | POA: Diagnosis not present

## 2011-08-17 LAB — TSH: TSH: 1.039 u[IU]/mL (ref 0.350–4.500)

## 2011-08-17 LAB — CARDIAC PANEL(CRET KIN+CKTOT+MB+TROPI)
Relative Index: INVALID (ref 0.0–2.5)
Total CK: 45 U/L (ref 7–177)
Troponin I: <0.3 ng/mL (ref <0.30)

## 2011-08-17 LAB — URINE CULTURE: Culture  Setup Time: 201306040224

## 2011-08-17 MED ORDER — GI COCKTAIL ~~LOC~~
30.0000 mL | Freq: Once | ORAL | Status: AC
  Administered 2011-08-17: 30 mL via ORAL
  Filled 2011-08-17: qty 30

## 2011-08-17 MED ORDER — OMEPRAZOLE 40 MG PO CPDR: 40.0000 mg | DELAYED_RELEASE_CAPSULE | Freq: Every day | ORAL | Status: DC

## 2011-08-17 MED ORDER — CIPROFLOXACIN HCL 250 MG PO TABS: 250.0000 mg | ORAL_TABLET | Freq: Two times a day (BID) | ORAL | Status: AC

## 2011-08-17 NOTE — Clinical Documentation Improvement (Signed)
CHF DOCUMENTATION CLARIFICATION QUERY  THIS DOCUMENT IS NOT A PERMANENT PART OF THE MEDICAL RECORD  TO RESPOND TO THE THIS QUERY, FOLLOW THE INSTRUCTIONS BELOW:  1. If needed, update documentation for the patient's encounter via the notes activity.  2. Access this query again and click edit on the In [employer].  3. After updating, or not, click F2 to complete all highlighted (required) fields concerning your review. Select "additional documentation in the medical record" OR "no additional documentation provided".  4. Click Sign note button.  5. The deficiency will fall out of your In Basket *Please let us know if you are not able to complete this workflow by phone or e-mail (listed below).  Please update your documentation within the medical record to reflect your response to this query.                                                                                    08/17/11  Dear Dr.Davis/ Associates,  In a better effort to capture your patient's severity of illness, reflect appropriate length of stay and utilization of resources, a review of the patient medical record has revealed the following indicators the diagnosis of Heart Failure.    Based on your clinical judgment, please clarify and document in a progress note and/or discharge summary the clinical condition associated with the following supporting information:  In responding to this query please exercise your independent judgment.  The fact that a query is asked, does not imply that any particular answer is desired or expected.  Possible Clinical Conditions?  Chronic Systolic Congestive Heart Failure Chronic Diastolic Congestive Heart Failure Chronic Systolic & Diastolic Congestive Heart Failure Acute Systolic Congestive Heart Failure Acute Diastolic Congestive Heart Failure Acute Systolic & Diastolic Congestive Heart Failure Acute on Chronic Systolic Congestive Heart Failure Acute on Chronic Diastolic Congestive  Heart Failure Acute on Chronic Systolic & Diastolic  Congestive Heart Failure Other Condition Cannot Clinically Determine  Supporting Information:  Diagnostics: 6/3: proBNP:  837.0  6/3: CXR results(clinical data: chest pain and SOB): Question early pulmonary vascular congestion  Treatment: 6/3:  IV Lasix 20mg  x1  Reviewed: No additional documentation provided.                                             Thank You,  Marciano Sequin,  Clinical Documentation Specialist:  Pager: [phone]  Health Information Management [place]

## 2011-08-17 NOTE — Care Management Note (Unsigned)
    Page 1 of 1   08/17/2011     11:33:57 AM   CARE MANAGEMENT NOTE 08/17/2011  Patient:  Jamie [name], Jamie [name]   Account Number:  [id number]  Date Initiated:  08/17/2011  Documentation initiated by:  SIMMONS,Meri Pelot  Subjective/Objective Assessment:   ADMITTED WITH WEAKNESS; LIVES AT HOME ALONE; STILL DRIVES; HAS 3 DAUGHTERS THAT LIVE NEARBY; IPTA.     Action/Plan:   DISCHARGE PLANNING INITIATED.   Anticipated DC Date:  08/18/2011   Anticipated DC Plan:  HOME/SELF CARE      DC Planning Services  CM consult      Choice offered to / List presented to:             Status of service:  In process, will continue to follow Medicare Important Message given?   (If response is "NO", the following Medicare IM given date fields will be blank) Date Medicare IM given:   Date Additional Medicare IM given:    Discharge Disposition:    Per UR Regulation:  Reviewed for med. necessity/level of care/duration of stay  If discussed at Long Length of Stay Meetings, dates discussed:    Comments:  08/17/11  1133  Ollie Delano SIMMONS RN, BSN [phone]

## 2011-08-17 NOTE — Progress Notes (Signed)
Pt ambulated 300 ft in hallway with standby assist. Monitored oxygen saturations while ambulating. Oxygen ranged from 87-94% while ambulating on room air. Pt tolerated ambulation well.  Dion Saucier

## 2011-08-17 NOTE — Progress Notes (Signed)
Pt discharge instructions and patient education complete. IV site d/c. Site WNL. Family had no further questions. D/C home with daughter.Dion Saucier

## 2011-08-18 ENCOUNTER — Encounter: Payer: Self-pay | Admitting: *Deleted

## 2011-08-19 NOTE — Discharge Summary (Signed)
Physician Discharge Summary  Jamie [name] ZOX:[id number] DOB: 1922-04-30 DOA: 08/16/2011  PCP: Ezequiel Kayser, MD, MD  Admit date: 08/16/2011 Discharge date: 08/19/2011   Follow-up Information    Follow up with Rodrigo Ran A, MD in 3 days.   Contact information:   [address] [facility] [place] [address] [phone]          Discharge Diagnoses:  Acute cystitis Nausea Orthostatic hypotension/Episodic dizziness HYPERTENSION, UNSPECIFIED COPD AV BLOCK, COMPLETE/ Pacemaker-Medtronic    Discharge Condition: stable Disposition: Home  Diet recommendation: Heart Healthy  History of present illness:  Patient is a 78 year old white female that presented to the emergency room secondary to nausea and stomach ache that started approximately 6 AM this morning. She also complained of some dizziness. She's had no vomiting. She also has some pain underneath her left armpit that has since resolved. In the emergency room patient had cardiac markers that were done that was normal. She had slightly elevated proBNP and her chest x-ray showed some mild vascular congestion. There were asked to admit her for further workup. Patient stated that when she had a non-ST elevation MI back in August of last year she had the same symptoms. She complains of some shortness of breath. She does have a history of COPD. Per her daughter she was given oxygen by her primary care physician at home but patient refused with oxygen. She complains of increased fatigue even just walking to the bathroom. She's had no fevers no headaches.   Hospital Course:  Acute cystitis Patient came in with symptoms of nausea. Urinalysis obtained showed 3-6 white blood cells. Patient states that when she had a urinary tract infection in August of last year and came in with same symptoms of nausea and tingling in the extremities she ruled in for a NSTEMI. She was worried about this being a NSTEMI. Patient  was admitted to the hospital and treated with IV antibiotic for urinary tract infection. Urine culture was sent. Patient was discharged on antibiotic for urinary tract infection. Cultures came back negative. Patient will complete a course of therapy. All other workup has been negative.  Nausea Patient had nausea on presentation. She obtained a GI cocktail which improved her nausea. She was discharged on a proton pump inhibitor. All cardiac markers were negative. 2-D echo was normal. Patient felt much better prior to being discharged. She will follow up with her regular doctor in 3 days.  Orthostatic hypotension/Episodic dizziness On presentation in the ED per ED physician patient was orthostatic. Orthostatic vitals checked on the floor were normal. Patient continued to have episodic dizziness. She will follow up with her primary care physician with this. Her head CT was negative.  HYPERTENSION, UNSPECIFIED Patient's blood pressure remained controlled she will continue taking her home antihypertensive medication.  Patient's BNP was mildly elevated. Her chest x-ray showed probably vascular congestion. Patient was asymptomatic for pulmonary edema and her 2-D echo showed normal pumping function. Clinically patient was not in heart failure.  COPD Patient denies a COPD. She continue with breathing treatments as needed.  AV BLOCK, COMPLETE/ Pacemaker-Medtronic Stable during this hospitalization.  Procedures:  Head CT: Negative  CXR: Question of pulmonary vascular congestion  Consultations:  None ?  Discharge Exam: Filed Vitals:   08/17/11 0928  BP: 147/69  Pulse: 71  Temp:   Resp:    Filed Vitals:   08/17/11 0437 08/17/11 0926 08/17/11 0927 08/17/11 0928  BP: 167/71 142/73 148/73 147/69  Pulse: 67 63  70 71  Temp: 97.8 F (36.6 C)     TempSrc: Oral     Resp: 19     SpO2: 95% 91% 94% 94%   General: Patient does not seem to be in any acute distress Cardiovascular: Regular rate  and rhythm  Respiratory: Clear to auscultation bilaterally Abdomen: Soft nontender nondistended positive bowel sounds Extremities: No edema Discharge Instructions  Discharge Orders    Future Appointments: Provider: Department: Dept Phone: Center:   08/23/2011 11:00 AM [facility] [phone] [facility]     Medication List  As of 08/19/2011  7:57 AM   TAKE these medications         ALPRAZolam 0.5 MG tablet   Commonly known as: XANAX   Take 0.5 mg by mouth at bedtime as needed. For sleep      CALTRATE 600 1500 MG Tabs   Generic drug: Calcium Carbonate   Take 1 tablet by mouth daily.      cholecalciferol 1000 UNITS tablet   Commonly known as: VITAMIN D   Take 1,000 Units by mouth daily.      ciprofloxacin 250 MG tablet   Commonly known as: CIPRO   Take 1 tablet (250 mg total) by mouth 2 (two) times daily.      CO Q 10 PO   Take 1 capsule by mouth daily.      EYE VITAMINS PO   Take by mouth daily.      multivitamin-iron-minerals-folic acid chewable tablet   Chew 1 tablet by mouth daily.      Fish Oil 1200 MG Caps   Take 1 capsule by mouth 2 (two) times daily.      Garlic Oil 1000 MG Caps   Take 1 capsule by mouth once a week.      Glucosamine Chondroitin Complx Caps   Take 1 capsule by mouth daily.      loratadine 10 MG tablet   Commonly known as: CLARITIN   Take 10 mg by mouth daily.      MAGNESIUM PO   Take 1 tablet by mouth 2 (two) times a week. No two days in particular      metoprolol 50 MG tablet   Commonly known as: LOPRESSOR   Take 50 mg by mouth 2 (two) times daily.      omeprazole 40 MG capsule   Commonly known as: PRILOSEC   Take 1 capsule (40 mg total) by mouth daily.      OSTEO BI-FLEX ADV JOINT SHIELD PO   Take 2 capsules by mouth daily.      PROAIR HFA 108 (90 BASE) MCG/ACT inhaler   Generic drug: albuterol   Inhale 2 puffs into the lungs every 6 (six) hours as needed. For shortness of breath/wheezing       VITAMIN B 12 PO   Take 1 tablet by mouth daily.           Follow-up Information    Follow up with Rodrigo Ran A, MD in 3 days.   Contact information:   [address] [facility] [place] [address] [phone]           The results of significant diagnostics from this hospitalization (including imaging, microbiology, ancillary and laboratory) are listed below for reference.    Significant Diagnostic Studies: Ct Head Wo Contrast  08/17/2011  *RADIOLOGY REPORT*  Clinical Data: Dizziness  CT HEAD WITHOUT CONTRAST  Technique:  Contiguous axial images were obtained from the  base of the skull through the vertex without contrast.  Comparison: None.  Findings: Mild global atrophy appropriate to age.  Minimal chronic ischemic changes in the periventricular white matter.  No mass effect, midline shift, or acute intracranial hemorrhage. Prominence at the tip of the basilar artery likely represents bifurcation into the right and left posterior cerebral arteries. Mastoid air cells and visualized paranasal sinuses are clear. Cranium is intact.  IMPRESSION: No acute intracranial pathology.  Original Report Authenticated By: Donavan Burnet, M.D.   Dg Chest Portable 1 View  08/16/2011  *RADIOLOGY REPORT*  Clinical Data: Chest pain, shortness of breath  PORTABLE CHEST - 1 VIEW  Comparison: 01/20/2009  Findings: Left subclavian pacemaker stable.  Atheromatous thoracic aorta.  Heart size upper limits normal.  Stable mild thoracic dextroscoliosis.  Some increase in prominence of perihilar and bibasilar interstitial markings suggesting early pulmonary vascular congestion.  No definite effusion.  IMPRESSION:  1.  Question early pulmonary vascular congestion.  Original Report Authenticated By: Osa Craver, M.D.   Acute Abdominal Series  08/16/2011  *RADIOLOGY REPORT*  Clinical Data: Nausea, abdominal pain.  ACUTE ABDOMEN SERIES (ABDOMEN 2 VIEW & CHEST 1 VIEW)   Comparison: 11/04/2010  Findings: There is hyperinflation of the lungs compatible with COPD.  Lungs are clear.  Heart is normal size.  Left pacer is unchanged.  Nonobstructive bowel gas pattern.  No free air.  No organomegaly or suspicious calcification.  Degenerative changes of the lumbar spine and hips.  No acute bony abnormality.  IMPRESSION: No evidence of bowel obstruction or free air.  COPD.  No acute findings.  Original Report Authenticated By: Cyndie Chime, M.D.    Microbiology: Recent Results (from the past 240 hour(s))  URINE CULTURE     Status: Normal   Collection Time   08/16/11  9:35 PM      Component Value Range Status Comment   Specimen Description URINE, RANDOM   Final    Special Requests NONE   Final    Culture  Setup Time [id number]   Final    Colony Count NO GROWTH   Final    Culture NO GROWTH   Final    Report Status 08/17/2011 FINAL   Final      Labs: Basic Metabolic Panel:  Lab 08/16/11 8119 08/16/11 0937 08/16/11 0913  NA -- 137 136  K -- 4.8 4.7  CL -- 101 98  CO2 -- -- 27  GLUCOSE -- 96 98  BUN -- 19 19  CREATININE 0.81 0.80 0.81  CALCIUM -- -- 9.1  MG -- -- --  PHOS -- -- --   Liver Function Tests:  Lab 08/16/11 0913  AST 26  ALT 20  ALKPHOS 89  BILITOT 0.6  PROT 6.6  ALBUMIN 3.5   CBC:  Lab 08/16/11 1425 08/16/11 0937 08/16/11 0913  WBC 8.3 -- 10.3  NEUTROABS -- -- --  HGB 15.1* 16.7* 15.5*  HCT 43.6 49.0* 44.6  MCV 94.4 -- 94.5  PLT 201 -- 199   Cardiac Enzymes:  Lab 08/17/11 0703 08/16/11 2131 08/16/11 1424 08/16/11 1242  CKTOTAL 45 50 41 42  CKMB 1.8 1.9 1.9 2.0  CKMBINDEX -- -- -- --  TROPONINI <0.30 <0.30 <0.30 <0.30   BNP: BNP (last 3 results)  Basename 08/16/11 0913  PROBNP 837.0*   Time coordinating discharge: 45 minutes  Signed:  Molli Posey, MD [name] 08/19/2011, 7:57 AM

## 2011-08-20 DIAGNOSIS — I10 Essential (primary) hypertension: Secondary | ICD-10-CM | POA: Diagnosis not present

## 2011-08-20 DIAGNOSIS — Z23 Encounter for immunization: Secondary | ICD-10-CM | POA: Diagnosis not present

## 2011-08-20 DIAGNOSIS — N390 Urinary tract infection, site not specified: Secondary | ICD-10-CM | POA: Diagnosis not present

## 2011-08-20 DIAGNOSIS — J45909 Unspecified asthma, uncomplicated: Secondary | ICD-10-CM | POA: Diagnosis not present

## 2011-08-21 NOTE — ED Provider Notes (Signed)
  I performed a history and physical examination of Jamie [name] and discussed her management with Dr. Cristal Ford.  I agree with the history, physical, assessment, and plan of care, with the following exceptions: None  This elderly female presents for concerns of new nausea, weakness, dyspnea.  On my exam the patient is in no distress, though she is notably lightheaded with upright positioning.  The patient's labs are notable for demonstration of CHF.  I have seen the ECG and agree with the interpretation.  Given these findings, the patient's persistent symptoms following resuscitation, she was admitted for further evaluation and management.  Jamie Jarvis, MD 08/21/11 [phone]

## 2011-08-23 ENCOUNTER — Ambulatory Visit (INDEPENDENT_AMBULATORY_CARE_PROVIDER_SITE_OTHER): Payer: Medicare Other | Admitting: *Deleted

## 2011-08-23 ENCOUNTER — Encounter: Payer: Self-pay | Admitting: Internal Medicine

## 2011-08-23 DIAGNOSIS — I442 Atrioventricular block, complete: Secondary | ICD-10-CM

## 2011-08-23 DIAGNOSIS — Z950 Presence of cardiac pacemaker: Secondary | ICD-10-CM

## 2011-08-23 LAB — PACEMAKER DEVICE OBSERVATION
AL AMPLITUDE: 2 mv
BMOD-0005RV: 95 {beats}/min
BRDY-0003RV: 120 {beats}/min
BRDY-0004RV: 110 {beats}/min
RV LEAD THRESHOLD: 1.125 V

## 2011-08-23 NOTE — Progress Notes (Signed)
Pacer check in clinic  

## 2011-09-21 DIAGNOSIS — M949 Disorder of cartilage, unspecified: Secondary | ICD-10-CM | POA: Diagnosis not present

## 2011-09-28 DIAGNOSIS — H3530 Unspecified macular degeneration: Secondary | ICD-10-CM | POA: Diagnosis not present

## 2011-10-12 DIAGNOSIS — Z981 Arthrodesis status: Secondary | ICD-10-CM | POA: Diagnosis not present

## 2011-10-12 DIAGNOSIS — M5137 Other intervertebral disc degeneration, lumbosacral region: Secondary | ICD-10-CM | POA: Diagnosis not present

## 2011-10-12 DIAGNOSIS — IMO0002 Reserved for concepts with insufficient information to code with codable children: Secondary | ICD-10-CM | POA: Diagnosis not present

## 2011-10-27 DIAGNOSIS — L570 Actinic keratosis: Secondary | ICD-10-CM | POA: Diagnosis not present

## 2011-10-27 DIAGNOSIS — D485 Neoplasm of uncertain behavior of skin: Secondary | ICD-10-CM | POA: Diagnosis not present

## 2011-11-22 DIAGNOSIS — H3530 Unspecified macular degeneration: Secondary | ICD-10-CM | POA: Diagnosis not present

## 2011-11-23 ENCOUNTER — Encounter: Payer: Medicare Other | Admitting: Internal Medicine

## 2011-11-23 DIAGNOSIS — H353290 Exudative age-related macular degeneration, unspecified eye, stage unspecified: Secondary | ICD-10-CM | POA: Diagnosis not present

## 2011-11-23 DIAGNOSIS — H4300 Vitreous prolapse, unspecified eye: Secondary | ICD-10-CM | POA: Diagnosis not present

## 2011-11-23 DIAGNOSIS — H3560 Retinal hemorrhage, unspecified eye: Secondary | ICD-10-CM | POA: Diagnosis not present

## 2011-11-23 DIAGNOSIS — H35059 Retinal neovascularization, unspecified, unspecified eye: Secondary | ICD-10-CM | POA: Diagnosis not present

## 2011-11-25 DIAGNOSIS — H35059 Retinal neovascularization, unspecified, unspecified eye: Secondary | ICD-10-CM | POA: Diagnosis not present

## 2011-11-25 DIAGNOSIS — H353290 Exudative age-related macular degeneration, unspecified eye, stage unspecified: Secondary | ICD-10-CM | POA: Diagnosis not present

## 2011-11-29 DIAGNOSIS — H353290 Exudative age-related macular degeneration, unspecified eye, stage unspecified: Secondary | ICD-10-CM | POA: Diagnosis not present

## 2011-11-29 DIAGNOSIS — H35059 Retinal neovascularization, unspecified, unspecified eye: Secondary | ICD-10-CM | POA: Diagnosis not present

## 2011-11-29 DIAGNOSIS — H35359 Cystoid macular degeneration, unspecified eye: Secondary | ICD-10-CM | POA: Diagnosis not present

## 2011-12-30 DIAGNOSIS — H353290 Exudative age-related macular degeneration, unspecified eye, stage unspecified: Secondary | ICD-10-CM | POA: Diagnosis not present

## 2011-12-30 DIAGNOSIS — H35059 Retinal neovascularization, unspecified, unspecified eye: Secondary | ICD-10-CM | POA: Diagnosis not present

## 2012-01-03 DIAGNOSIS — H35059 Retinal neovascularization, unspecified, unspecified eye: Secondary | ICD-10-CM | POA: Diagnosis not present

## 2012-01-03 DIAGNOSIS — H353290 Exudative age-related macular degeneration, unspecified eye, stage unspecified: Secondary | ICD-10-CM | POA: Diagnosis not present

## 2012-01-13 ENCOUNTER — Ambulatory Visit (INDEPENDENT_AMBULATORY_CARE_PROVIDER_SITE_OTHER): Payer: Medicare Other | Admitting: Internal Medicine

## 2012-01-13 ENCOUNTER — Encounter: Payer: Self-pay | Admitting: Internal Medicine

## 2012-01-13 VITALS — BP 151/83 | HR 65 | Ht 75.0 in | Wt 125.0 lb

## 2012-01-13 DIAGNOSIS — I10 Essential (primary) hypertension: Secondary | ICD-10-CM | POA: Diagnosis not present

## 2012-01-13 DIAGNOSIS — I442 Atrioventricular block, complete: Secondary | ICD-10-CM | POA: Diagnosis not present

## 2012-01-13 DIAGNOSIS — Z950 Presence of cardiac pacemaker: Secondary | ICD-10-CM | POA: Diagnosis not present

## 2012-01-13 LAB — PACEMAKER DEVICE OBSERVATION
AL AMPLITUDE: 0.7 mv
BMOD-0001RV: LOW
BMOD-0003RV: 30
BMOD-0005RV: 95 {beats}/min
BRDY-0002RV: 60 {beats}/min
BRDY-0003RV: 120 {beats}/min
RV LEAD IMPEDENCE PM: 520 Ohm

## 2012-01-13 MED ORDER — METOPROLOL TARTRATE 50 MG PO TABS: 25.0000 mg | ORAL_TABLET | Freq: Two times a day (BID) | ORAL | Status: DC

## 2012-01-13 NOTE — Progress Notes (Signed)
Patient Care Team: Ezequiel Kayser, MD as PCP - General (Internal Medicine)   HPI  Jamie [name] is a 78 y.o. female seen in followup for complete heart block for which she is status post pacemaker implantation which is programmed in the VDD mode. She underwent device generator replacement fall 2010  She says that she has some increasing fatigue. In part she related to the lack of exercise. We were also whether is related to her beta blocker.  About a month ago or so, she was diagnosed with macular degeneration and she describes herself as "a miracle" with fast improvement in frightening visual impairment in her right eye.   She underwent catheterization demonstrating no obstructive coronary disease. (August 2012)   .  Echo 2009 demgitationstrated Left ventricular ejection fraction was estimated to be 60 %.  - There was mild mitral annular calcification. There was mild  mitral valvular reguron.   Past Medical History  Diagnosis Date  . Complete AV block   . Presence of permanent cardiac pacemaker   . Tachycardia   . Hypertension   . Osteopenia   . Osteoarthritis   . COPD (chronic obstructive pulmonary disease)   . Pacemaker   . Shortness of breath   . Pneumonia   . Asthma   . GERD (gastroesophageal reflux disease)     Past Surgical History  Procedure Date  . Hernia repair 11/2002  . Eye surgery 10/2004  . Pacemaker placement     Medtronic  . Cholecystectomy     Current Outpatient Prescriptions  Medication Sig Dispense Refill  . albuterol (PROAIR HFA) 108 (90 BASE) MCG/ACT inhaler Inhale 2 puffs into the lungs every 6 (six) hours as needed. For shortness of breath/wheezing      . ALPRAZolam (XANAX) 0.5 MG tablet Take 0.5 mg by mouth at bedtime as needed. For sleep      . Calcium Carbonate (CALTRATE 600) 1500 MG TABS Take 1 tablet by mouth daily.        . cholecalciferol (VITAMIN D) 1000 UNITS tablet Take 1,000 Units by mouth daily.        . Coenzyme Q10 (CO Q 10 PO) Take  1 capsule by mouth daily.      . Cyanocobalamin (VITAMIN B 12 PO) Take 1 tablet by mouth daily.      . Garlic Oil 1000 MG CAPS Take 1 capsule by mouth once a week.        . Glucosamine-Chondroit-Vit C-Mn (GLUCOSAMINE CHONDROITIN COMPLX) CAPS Take 1 capsule by mouth daily.        Marland Kitchen loratadine (CLARITIN) 10 MG tablet Take 10 mg by mouth daily.      . metoprolol (LOPRESSOR) 50 MG tablet Take 50 mg by mouth 2 (two) times daily.        . Misc Natural Products (OSTEO BI-FLEX ADV JOINT SHIELD PO) Take 2 capsules by mouth daily.        . Multiple Vitamins-Minerals (EYE VITAMINS PO) Take by mouth daily.        . multivitamin-iron-minerals-folic acid (CENTRUM) chewable tablet Chew 1 tablet by mouth daily.        . Omega-3 Fatty Acids (FISH OIL) 1200 MG CAPS Take 1 capsule by mouth 2 (two) times daily.          Allergies  Allergen Reactions  . Hydrocodone-Acetaminophen     REACTION: GI distress    Review of Systems negative except from HPI and PMH  Physical Exam BP 151/83  Pulse  65  Ht 6\' 3"  (1.905 m)  Wt 125 lb (56.7 kg)  BMI 15.62 kg/m2 Well developed and well nourished in no acute distress HENT normal E scleral and icterus clear Neck Supple JVP flat; carotids brisk and full Clear to ausculation Chief , no murmurs gallops or rub Soft with active bowel sounds No clubbing cyanosis none Edema Alert and oriented, grossly normal motor and sensory function Skin Warm and Dry   electrocardiogram demonstrates P. synchronous pacing   Assessment and  Plan

## 2012-01-13 NOTE — Assessment & Plan Note (Signed)
The patient's device was interrogated.  The information was reviewed. No changes were made in the programming.   Of

## 2012-01-13 NOTE — Assessment & Plan Note (Signed)
She has significant hypertension she has fatigue. I wonder whether the fatigue is from the beta blocker. I've asked her to undertake a 2 week elimination trial by reducing her metoprolol from 50-25 twice a day. She will let us know how she is doing from fatigue with you.  I should note that her blood pressure today were as high as 170 lying flat so I do this with some trepidation although she says her blood pressures at home are mostly in the 120-1:30 range

## 2012-01-13 NOTE — Patient Instructions (Signed)
Your physician has recommended you make the following change in your medication: Cut your lopressor in half.  Take your blood pressure, write it down and call us in 2 weeks to let us know how it is doing.  Sit in a recliner or easy chair when you check your blood pressure  Your physician wants you to follow-up in: 1 year.   You will receive a reminder letter in the mail two months in advance. If you don't receive a letter, please call our office to schedule the follow-up appointment.

## 2012-01-13 NOTE — Assessment & Plan Note (Signed)
Stable post pacing 

## 2012-01-17 ENCOUNTER — Encounter: Payer: Self-pay | Admitting: Internal Medicine

## 2012-01-27 ENCOUNTER — Telehealth: Payer: Self-pay | Admitting: Internal Medicine

## 2012-01-27 NOTE — Telephone Encounter (Signed)
Noted  

## 2012-01-27 NOTE — Telephone Encounter (Signed)
FYI: Patient calling to report her blood pressure is 127/71, she is not feeling as tired on lower med dose.

## 2012-02-03 DIAGNOSIS — H35059 Retinal neovascularization, unspecified, unspecified eye: Secondary | ICD-10-CM | POA: Diagnosis not present

## 2012-02-03 DIAGNOSIS — H353290 Exudative age-related macular degeneration, unspecified eye, stage unspecified: Secondary | ICD-10-CM | POA: Diagnosis not present

## 2012-02-07 DIAGNOSIS — H35059 Retinal neovascularization, unspecified, unspecified eye: Secondary | ICD-10-CM | POA: Diagnosis not present

## 2012-02-07 DIAGNOSIS — H353290 Exudative age-related macular degeneration, unspecified eye, stage unspecified: Secondary | ICD-10-CM | POA: Diagnosis not present

## 2012-03-13 DIAGNOSIS — H353290 Exudative age-related macular degeneration, unspecified eye, stage unspecified: Secondary | ICD-10-CM | POA: Diagnosis not present

## 2012-03-13 DIAGNOSIS — H35059 Retinal neovascularization, unspecified, unspecified eye: Secondary | ICD-10-CM | POA: Diagnosis not present

## 2012-03-20 DIAGNOSIS — H35059 Retinal neovascularization, unspecified, unspecified eye: Secondary | ICD-10-CM | POA: Diagnosis not present

## 2012-03-20 DIAGNOSIS — H353290 Exudative age-related macular degeneration, unspecified eye, stage unspecified: Secondary | ICD-10-CM | POA: Diagnosis not present

## 2012-04-04 DIAGNOSIS — E785 Hyperlipidemia, unspecified: Secondary | ICD-10-CM | POA: Diagnosis not present

## 2012-04-04 DIAGNOSIS — R7301 Impaired fasting glucose: Secondary | ICD-10-CM | POA: Diagnosis not present

## 2012-04-04 DIAGNOSIS — R82998 Other abnormal findings in urine: Secondary | ICD-10-CM | POA: Diagnosis not present

## 2012-04-04 DIAGNOSIS — I10 Essential (primary) hypertension: Secondary | ICD-10-CM | POA: Diagnosis not present

## 2012-04-04 DIAGNOSIS — M810 Age-related osteoporosis without current pathological fracture: Secondary | ICD-10-CM | POA: Diagnosis not present

## 2012-04-11 DIAGNOSIS — Z79899 Other long term (current) drug therapy: Secondary | ICD-10-CM | POA: Diagnosis not present

## 2012-04-11 DIAGNOSIS — Z0000 Encounter for general adult medical examination without abnormal findings: Secondary | ICD-10-CM | POA: Diagnosis not present

## 2012-04-11 DIAGNOSIS — I10 Essential (primary) hypertension: Secondary | ICD-10-CM | POA: Diagnosis not present

## 2012-04-11 DIAGNOSIS — Z124 Encounter for screening for malignant neoplasm of cervix: Secondary | ICD-10-CM | POA: Diagnosis not present

## 2012-04-11 DIAGNOSIS — M169 Osteoarthritis of hip, unspecified: Secondary | ICD-10-CM | POA: Diagnosis not present

## 2012-04-13 DIAGNOSIS — Z1212 Encounter for screening for malignant neoplasm of rectum: Secondary | ICD-10-CM | POA: Diagnosis not present

## 2012-04-24 DIAGNOSIS — H35059 Retinal neovascularization, unspecified, unspecified eye: Secondary | ICD-10-CM | POA: Diagnosis not present

## 2012-04-24 DIAGNOSIS — H353290 Exudative age-related macular degeneration, unspecified eye, stage unspecified: Secondary | ICD-10-CM | POA: Diagnosis not present

## 2012-04-29 ENCOUNTER — Other Ambulatory Visit: Payer: Self-pay

## 2012-05-10 DIAGNOSIS — H35059 Retinal neovascularization, unspecified, unspecified eye: Secondary | ICD-10-CM | POA: Diagnosis not present

## 2012-05-26 DIAGNOSIS — H35059 Retinal neovascularization, unspecified, unspecified eye: Secondary | ICD-10-CM | POA: Diagnosis not present

## 2012-06-28 DIAGNOSIS — H35059 Retinal neovascularization, unspecified, unspecified eye: Secondary | ICD-10-CM | POA: Diagnosis not present

## 2012-06-28 DIAGNOSIS — H353290 Exudative age-related macular degeneration, unspecified eye, stage unspecified: Secondary | ICD-10-CM | POA: Diagnosis not present

## 2012-07-05 DIAGNOSIS — H353290 Exudative age-related macular degeneration, unspecified eye, stage unspecified: Secondary | ICD-10-CM | POA: Diagnosis not present

## 2012-07-05 DIAGNOSIS — H35059 Retinal neovascularization, unspecified, unspecified eye: Secondary | ICD-10-CM | POA: Diagnosis not present

## 2012-07-19 ENCOUNTER — Ambulatory Visit (INDEPENDENT_AMBULATORY_CARE_PROVIDER_SITE_OTHER): Payer: Medicare Other | Admitting: *Deleted

## 2012-07-19 ENCOUNTER — Encounter: Payer: Self-pay | Admitting: Internal Medicine

## 2012-07-19 ENCOUNTER — Other Ambulatory Visit: Payer: Self-pay

## 2012-07-19 DIAGNOSIS — I442 Atrioventricular block, complete: Secondary | ICD-10-CM

## 2012-07-19 LAB — PACEMAKER DEVICE OBSERVATION
AL AMPLITUDE: 1.4 mv
BMOD-0001RV: LOW
BMOD-0003RV: 30
BMOD-0005RV: 95 {beats}/min
BRDY-0002RV: 60 {beats}/min
BRDY-0003RV: 120 {beats}/min
RV LEAD IMPEDENCE PM: 483 Ohm

## 2012-07-19 NOTE — Progress Notes (Signed)
Pacemaker check in clinic  

## 2012-07-21 ENCOUNTER — Inpatient Hospital Stay (HOSPITAL_COMMUNITY)
Admission: EM | Admit: 2012-07-21 | Discharge: 2012-07-25 | DRG: 190 | Disposition: A | Discharge status: Home / Self Care | Payer: Medicare Other | Attending: Internal Medicine | Admitting: Internal Medicine

## 2012-07-21 ENCOUNTER — Emergency Department (HOSPITAL_COMMUNITY): Payer: Medicare Other

## 2012-07-21 ENCOUNTER — Encounter (HOSPITAL_COMMUNITY): Payer: Self-pay | Admitting: Emergency Medicine

## 2012-07-21 DIAGNOSIS — Z66 Do not resuscitate: Secondary | ICD-10-CM | POA: Diagnosis not present

## 2012-07-21 DIAGNOSIS — I442 Atrioventricular block, complete: Secondary | ICD-10-CM | POA: Diagnosis not present

## 2012-07-21 DIAGNOSIS — Z950 Presence of cardiac pacemaker: Secondary | ICD-10-CM | POA: Diagnosis not present

## 2012-07-21 DIAGNOSIS — R799 Abnormal finding of blood chemistry, unspecified: Secondary | ICD-10-CM

## 2012-07-21 DIAGNOSIS — K219 Gastro-esophageal reflux disease without esophagitis: Secondary | ICD-10-CM | POA: Diagnosis present

## 2012-07-21 DIAGNOSIS — R000 Tachycardia, unspecified: Secondary | ICD-10-CM

## 2012-07-21 DIAGNOSIS — J441 Chronic obstructive pulmonary disease with (acute) exacerbation: Secondary | ICD-10-CM

## 2012-07-21 DIAGNOSIS — I472 Ventricular tachycardia, unspecified: Secondary | ICD-10-CM

## 2012-07-21 DIAGNOSIS — N3000 Acute cystitis without hematuria: Secondary | ICD-10-CM

## 2012-07-21 DIAGNOSIS — Z87891 Personal history of nicotine dependence: Secondary | ICD-10-CM | POA: Diagnosis not present

## 2012-07-21 DIAGNOSIS — I509 Heart failure, unspecified: Secondary | ICD-10-CM | POA: Diagnosis present

## 2012-07-21 DIAGNOSIS — R110 Nausea: Secondary | ICD-10-CM

## 2012-07-21 DIAGNOSIS — I5031 Acute diastolic (congestive) heart failure: Secondary | ICD-10-CM

## 2012-07-21 DIAGNOSIS — M1990 Unspecified osteoarthritis, unspecified site: Secondary | ICD-10-CM | POA: Diagnosis present

## 2012-07-21 DIAGNOSIS — I214 Non-ST elevation (NSTEMI) myocardial infarction: Secondary | ICD-10-CM

## 2012-07-21 DIAGNOSIS — I10 Essential (primary) hypertension: Secondary | ICD-10-CM | POA: Diagnosis present

## 2012-07-21 DIAGNOSIS — J449 Chronic obstructive pulmonary disease, unspecified: Secondary | ICD-10-CM

## 2012-07-21 DIAGNOSIS — J40 Bronchitis, not specified as acute or chronic: Secondary | ICD-10-CM | POA: Diagnosis not present

## 2012-07-21 DIAGNOSIS — M899 Disorder of bone, unspecified: Secondary | ICD-10-CM

## 2012-07-21 DIAGNOSIS — J9600 Acute respiratory failure, unspecified whether with hypoxia or hypercapnia: Secondary | ICD-10-CM

## 2012-07-21 DIAGNOSIS — R0602 Shortness of breath: Secondary | ICD-10-CM | POA: Diagnosis not present

## 2012-07-21 DIAGNOSIS — I369 Nonrheumatic tricuspid valve disorder, unspecified: Secondary | ICD-10-CM | POA: Diagnosis not present

## 2012-07-21 DIAGNOSIS — R7989 Other specified abnormal findings of blood chemistry: Secondary | ICD-10-CM

## 2012-07-21 DIAGNOSIS — R9431 Abnormal electrocardiogram [ECG] [EKG]: Secondary | ICD-10-CM | POA: Diagnosis not present

## 2012-07-21 DIAGNOSIS — I951 Orthostatic hypotension: Secondary | ICD-10-CM

## 2012-07-21 DIAGNOSIS — J4489 Other specified chronic obstructive pulmonary disease: Secondary | ICD-10-CM

## 2012-07-21 HISTORY — DX: Urinary tract infection, site not specified: N39.0

## 2012-07-21 HISTORY — DX: Family history of other specified conditions: Z84.89

## 2012-07-21 LAB — CBC
Platelets: 169 10*3/uL (ref 150–400)
RBC: 4.7 MIL/uL (ref 3.87–5.11)
RDW: 13.3 % (ref 11.5–15.5)
WBC: 7.2 10*3/uL (ref 4.0–10.5)

## 2012-07-21 LAB — BASIC METABOLIC PANEL
CO2: 29 mEq/L (ref 19–32)
Chloride: 99 mEq/L (ref 96–112)
Creatinine, Ser: 0.82 mg/dL (ref 0.50–1.10)
GFR calc Af Amer: 72 mL/min — ABNORMAL LOW (ref >90)
Potassium: 3.6 mEq/L (ref 3.5–5.1)
Sodium: 139 mEq/L (ref 135–145)

## 2012-07-21 LAB — TROPONIN I
Troponin I: <0.3 ng/mL (ref <0.30)
Troponin I: <0.3 ng/mL (ref <0.30)

## 2012-07-21 LAB — PRO B NATRIURETIC PEPTIDE: Pro B Natriuretic peptide (BNP): 1476 pg/mL — ABNORMAL HIGH (ref 0–450)

## 2012-07-21 MED ORDER — ALPRAZOLAM 0.5 MG PO TABS
0.5000 mg | ORAL_TABLET | Freq: Every day | ORAL | Status: DC
  Administered 2012-07-21 – 2012-07-24 (×4): 0.5 mg via ORAL
  Filled 2012-07-21 (×4): qty 1

## 2012-07-21 MED ORDER — SODIUM CHLORIDE 0.9 % IV SOLN: 250.0000 mL | INTRAVENOUS | Status: DC | PRN

## 2012-07-21 MED ORDER — ALBUTEROL SULFATE (5 MG/ML) 0.5% IN NEBU: 5.0000 mg | INHALATION_SOLUTION | Freq: Once | RESPIRATORY_TRACT | Status: DC

## 2012-07-21 MED ORDER — ONDANSETRON HCL 4 MG/2ML IJ SOLN: 4.0000 mg | Freq: Four times a day (QID) | INTRAMUSCULAR | Status: DC | PRN

## 2012-07-21 MED ORDER — LORATADINE 10 MG PO TABS
10.0000 mg | ORAL_TABLET | Freq: Every day | ORAL | Status: DC
Start: 2012-07-22 — End: 2012-07-25
  Administered 2012-07-22 – 2012-07-25 (×4): 10 mg via ORAL
  Filled 2012-07-21 (×4): qty 1

## 2012-07-21 MED ORDER — METOPROLOL TARTRATE 25 MG PO TABS
25.0000 mg | ORAL_TABLET | Freq: Two times a day (BID) | ORAL | Status: DC
  Administered 2012-07-21 – 2012-07-25 (×8): 25 mg via ORAL
  Filled 2012-07-21 (×10): qty 1

## 2012-07-21 MED ORDER — GUAIFENESIN-DM 100-10 MG/5ML PO SYRP
5.0000 mL | ORAL_SOLUTION | ORAL | Status: DC | PRN
  Administered 2012-07-23 – 2012-07-25 (×3): 5 mL via ORAL
  Filled 2012-07-21 (×2): qty 5

## 2012-07-21 MED ORDER — PANTOPRAZOLE SODIUM 40 MG PO TBEC
40.0000 mg | DELAYED_RELEASE_TABLET | Freq: Every day | ORAL | Status: DC
  Administered 2012-07-21 – 2012-07-25 (×5): 40 mg via ORAL
  Filled 2012-07-21 (×3): qty 1

## 2012-07-21 MED ORDER — SODIUM CHLORIDE 0.9 % IJ SOLN
3.0000 mL | Freq: Two times a day (BID) | INTRAMUSCULAR | Status: DC
  Administered 2012-07-21 – 2012-07-25 (×7): 3 mL via INTRAVENOUS

## 2012-07-21 MED ORDER — IPRATROPIUM BROMIDE 0.02 % IN SOLN
0.5000 mg | Freq: Four times a day (QID) | RESPIRATORY_TRACT | Status: DC
  Administered 2012-07-21 – 2012-07-23 (×7): 0.5 mg via RESPIRATORY_TRACT
  Filled 2012-07-21 (×7): qty 2.5

## 2012-07-21 MED ORDER — ACETAMINOPHEN 325 MG PO TABS: 650.0000 mg | ORAL_TABLET | Freq: Four times a day (QID) | ORAL | Status: DC | PRN

## 2012-07-21 MED ORDER — LEVOFLOXACIN IN D5W 750 MG/150ML IV SOLN: 750.0000 mg | INTRAVENOUS | Status: DC

## 2012-07-21 MED ORDER — FUROSEMIDE 10 MG/ML IJ SOLN
40.0000 mg | Freq: Once | INTRAMUSCULAR | Status: AC
  Administered 2012-07-21: 40 mg via INTRAVENOUS

## 2012-07-21 MED ORDER — SODIUM CHLORIDE 0.9 % IJ SOLN: 3.0000 mL | INTRAMUSCULAR | Status: DC | PRN

## 2012-07-21 MED ORDER — FLUTICASONE PROPIONATE 50 MCG/ACT NA SUSP
2.0000 | Freq: Every day | NASAL | Status: DC
  Administered 2012-07-25: 2 via NASAL
  Filled 2012-07-21: qty 16

## 2012-07-21 MED ORDER — VITAMIN D3 25 MCG (1000 UNIT) PO TABS
1000.0000 [IU] | ORAL_TABLET | Freq: Every day | ORAL | Status: DC
  Administered 2012-07-21 – 2012-07-25 (×5): 1000 [IU] via ORAL
  Filled 2012-07-21 (×5): qty 1

## 2012-07-21 MED ORDER — SODIUM CHLORIDE 0.9 % IJ SOLN
3.0000 mL | Freq: Two times a day (BID) | INTRAMUSCULAR | Status: DC
  Administered 2012-07-21 – 2012-07-24 (×7): 3 mL via INTRAVENOUS

## 2012-07-21 MED ORDER — CALCIUM CARBONATE 1250 (500 CA) MG PO TABS
1250.0000 mg | ORAL_TABLET | Freq: Every day | ORAL | Status: DC
  Administered 2012-07-21 – 2012-07-25 (×5): 1250 mg via ORAL
  Filled 2012-07-21 (×6): qty 1

## 2012-07-21 MED ORDER — ACETAMINOPHEN 650 MG RE SUPP: 650.0000 mg | Freq: Four times a day (QID) | RECTAL | Status: DC | PRN

## 2012-07-21 MED ORDER — METHYLPREDNISOLONE SODIUM SUCC 125 MG IJ SOLR: 125.0000 mg | Freq: Once | INTRAMUSCULAR | Status: DC

## 2012-07-21 MED ORDER — LEVOFLOXACIN IN D5W 750 MG/150ML IV SOLN
750.0000 mg | INTRAVENOUS | Status: DC
  Administered 2012-07-21 – 2012-07-23 (×3): 750 mg via INTRAVENOUS
  Filled 2012-07-21 (×3): qty 150

## 2012-07-21 MED ORDER — LEVALBUTEROL HCL 0.63 MG/3ML IN NEBU
0.6300 mg | INHALATION_SOLUTION | Freq: Four times a day (QID) | RESPIRATORY_TRACT | Status: DC
  Administered 2012-07-21 – 2012-07-23 (×7): 0.63 mg via RESPIRATORY_TRACT
  Filled 2012-07-21 (×12): qty 3

## 2012-07-21 MED ORDER — HYDROMORPHONE HCL PF 1 MG/ML IJ SOLN: 1.0000 mg | INTRAMUSCULAR | Status: DC | PRN

## 2012-07-21 MED ORDER — ALBUTEROL SULFATE (5 MG/ML) 0.5% IN NEBU
INHALATION_SOLUTION | RESPIRATORY_TRACT | Status: AC
  Administered 2012-07-21: 5 mg
  Filled 2012-07-21: qty 1

## 2012-07-21 MED ORDER — PREDNISONE 50 MG PO TABS
60.0000 mg | ORAL_TABLET | Freq: Once | ORAL | Status: AC
  Administered 2012-07-21: 60 mg via ORAL
  Filled 2012-07-21: qty 1

## 2012-07-21 MED ORDER — CALCIUM CARBONATE 1500 (600 CA) MG PO TABS: 1.0000 | ORAL_TABLET | Freq: Every day | ORAL | Status: DC

## 2012-07-21 MED ORDER — PREDNISONE 20 MG PO TABS
40.0000 mg | ORAL_TABLET | Freq: Every day | ORAL | Status: DC
  Administered 2012-07-22 – 2012-07-25 (×4): 40 mg via ORAL
  Filled 2012-07-21 (×5): qty 2

## 2012-07-21 MED ORDER — ALUM & MAG HYDROXIDE-SIMETH 200-200-20 MG/5ML PO SUSP: 30.0000 mL | Freq: Four times a day (QID) | ORAL | Status: DC | PRN

## 2012-07-21 MED ORDER — OMEGA-3-ACID ETHYL ESTERS 1 G PO CAPS
1.0000 g | ORAL_CAPSULE | Freq: Two times a day (BID) | ORAL | Status: DC
  Administered 2012-07-21 – 2012-07-25 (×9): 1 g via ORAL
  Filled 2012-07-21 (×10): qty 1

## 2012-07-21 MED ORDER — ONDANSETRON HCL 4 MG PO TABS: 4.0000 mg | ORAL_TABLET | Freq: Four times a day (QID) | ORAL | Status: DC | PRN

## 2012-07-21 MED ORDER — MONTELUKAST SODIUM 10 MG PO TABS
10.0000 mg | ORAL_TABLET | Freq: Every day | ORAL | Status: DC
  Administered 2012-07-21 – 2012-07-24 (×4): 10 mg via ORAL
  Filled 2012-07-21 (×5): qty 1

## 2012-07-21 NOTE — ED Notes (Signed)
Family at bedside. 

## 2012-07-21 NOTE — Progress Notes (Signed)
UR complete.  Atlee Villers RN, MSN 

## 2012-07-21 NOTE — H&P (Signed)
History and Physical       Hospital Admission Note Date: 07/21/2012  Patient name: Jamie [name] Medical record number: [id number] Date of birth: [birth year] Age: 79 y.o. Gender: female PCP: Ezequiel Kayser, MD    Chief Complaint:  Shortness of breath with wheezing, congestion for the last 2 weeks  HPI: Patient is 79 year old female with history of COPD, pacemaker secondary to complete AV block, GERD, asthma, osteoarthritis presented with shortness of breath and coughing for the last 2 weeks, worsened in the last 1 week. Patient reports that she was having allergies and getting short of breath in the last 2 weeks however yesterday she started coughing, productive with clear sputum. She had no fevers but chills and used her albuterol inhaler every 4 hours without any significant relief. Today she felt jittery, more short of breath, palpitations hence presented to ED. EMS gave her albuterol/Atrovent nebs, Solu-Medrol en route.  ED workup also showed troponin less than 0.3, BNP elevated at 1476, chest x-ray showed mild interstitial prominence bilaterally without convincing pulmonary edema, mild bronchitic changes  Review of Systems:  Constitutional: Denies fever, diaphoresis, poor appetite, + chills and fatigue.  HEENT: Denies photophobia, eye pain, redness, hearing loss, ear pain, + congestion, sore throat,+ rhinorrhea, sneezing, mouth sores, trouble swallowing, neck pain, neck stiffness and tinnitus.   Respiratory: Please see history of present illness  Cardiovascular: Denies chest pain, and leg swelling.  denies any orthopnea or PND or any weight gain. Gastrointestinal: Denies nausea, vomiting, abdominal pain, diarrhea, constipation, blood in stool and abdominal distention.  Genitourinary: Denies dysuria, urgency, frequency, hematuria, flank pain and difficulty urinating.  Musculoskeletal: Denies myalgias, back pain, joint swelling, arthralgias  and gait problem.  Skin: Denies pallor, rash and wound.  Neurological: Denies dizziness, seizures, syncope, weakness, light-headedness, numbness and headaches.  Hematological: Denies adenopathy. Easy bruising, personal or family bleeding history  Psychiatric/Behavioral: Denies suicidal ideation, mood changes, confusion, nervousness, sleep disturbance and agitation  Past Medical History: Past Medical History  Diagnosis Date  . Complete AV block   . Presence of permanent cardiac pacemaker   . Tachycardia   . Hypertension   . Osteopenia   . Osteoarthritis   . COPD (chronic obstructive pulmonary disease)   . Pacemaker   . Shortness of breath   . Pneumonia   . Asthma   . GERD (gastroesophageal reflux disease)    Past Surgical History  Procedure Laterality Date  . Hernia repair  11/2002  . Eye surgery  10/2004  . Pacemaker placement      Medtronic  . Cholecystectomy      Medications: Prior to Admission medications   Medication Sig Start Date End Date Taking? Authorizing Provider  albuterol (PROAIR HFA) 108 (90 BASE) MCG/ACT inhaler Inhale 2 puffs into the lungs every 6 (six) hours as needed. For shortness of breath/wheezing   Yes Historical Provider, MD  ALPRAZolam Prudy Feeler) 0.5 MG tablet Take 0.5 mg by mouth at bedtime as needed. For sleep   Yes Historical Provider, MD  azithromycin (ZITHROMAX) 500 MG tablet Take 500 mg by mouth daily. starting 07/20/12 for 3 days   Yes Historical Provider, MD  Calcium Carbonate (CALTRATE 600) 1500 MG TABS Take 1 tablet by mouth daily.     Yes Historical Provider, MD  cholecalciferol (VITAMIN D) 1000 UNITS tablet Take 1,000 Units by mouth daily.     Yes Historical Provider, MD  Coenzyme Q10 (CO Q 10 PO) Take 1 capsule by mouth daily.   Yes Historical Provider, MD  Cyanocobalamin (VITAMIN B 12 PO) Take 1 tablet by mouth daily.   Yes Historical Provider, MD  fluticasone (FLONASE) 50 MCG/ACT nasal spray Place 2 sprays into the nose daily.   Yes Historical  Provider, MD  loratadine (CLARITIN) 10 MG tablet Take 10 mg by mouth daily.   Yes Historical Provider, MD  metoprolol (LOPRESSOR) 50 MG tablet Take 0.5 tablets (25 mg total) by mouth 2 (two) times daily. 01/13/12  Yes Duke Salvia, MD  Misc Natural Products (OSTEO BI-FLEX ADV JOINT SHIELD PO) Take 2 capsules by mouth daily.     Yes Historical Provider, MD  Multiple Vitamins-Minerals (EYE VITAMINS PO) Take by mouth daily.     Yes Historical Provider, MD  ofloxacin (OCUFLOX) 0.3 % ophthalmic solution Apply 1 drop to eye See admin instructions. Used 3 times a day for 3 days after eye injections every 4 to 6 weeks.   Yes Historical Provider, MD  Omega-3 Fatty Acids (FISH OIL) 1200 MG CAPS Take 1 capsule by mouth daily.    Yes Historical Provider, MD    Allergies:   Allergies  Allergen Reactions  . Hydrocodone-Acetaminophen     REACTION: GI distress    Social History:  reports that she quit smoking about 25 years ago. She has never used smokeless tobacco. She reports that she does not drink alcohol or use illicit drugs. patient lives at home by herself and is functional with ADLs, drives her car  Family History: No family history on file.  Physical Exam: Blood pressure 110/44, pulse 100, temperature 98.2 F (36.8 C), temperature source Oral, resp. rate 19, SpO2 92.00%. General: Alert, awake, oriented x3, in no acute distress. HEENT: normocephalic, atraumatic, anicteric sclera, pink conjunctiva, pupils equal and reactive to light and accomodation, oropharynx clear Neck: supple, no masses or lymphadenopathy, no goiter, no bruits  Heart: Regular rate and rhythm, without murmurs, rubs or gallops. Lungs: Decreased breath sound at the bases with scattered wheezing bilaterally  Abdomen: Soft, nontender, nondistended, positive bowel sounds, no masses. Extremities: No clubbing, cyanosis or edema with positive pedal pulses. Neuro: Grossly intact, no focal neurological deficits, strength 5/5 upper  and lower extremities bilaterally Psych: alert and oriented x 3, normal mood and affect Skin: no rashes or lesions, warm and dry   LABS on Admission:  Basic Metabolic Panel:  Recent Labs Lab 07/21/12 0958  NA 139  K 3.6  CL 99  CO2 29  GLUCOSE 144*  BUN 11  CREATININE 0.82  CALCIUM 9.9   CBC:  Recent Labs Lab 07/21/12 0958  WBC 7.2  HGB 15.4*  HCT 44.6  MCV 94.9  PLT 169   Cardiac Enzymes:  Recent Labs Lab 07/21/12 0958  TROPONINI <0.30   BNP: No components found with this basename: POCBNP,  CBG: No results found for this basename: GLUCAP,  in the last 168 hours   Radiological Exams on Admission: Dg Chest 2 View  07/21/2012  *RADIOLOGY REPORT*  Clinical Data: Cough, chest pain  CHEST - 2 VIEW  Comparison: 08/16/2011  Findings: Cardiomediastinal silhouette is stable.  Dual lead cardiac pacemaker is unchanged in position.  Mild interstitial prominence bilaterally without convincing pulmonary edema.  Mild infrahilar bronchitic changes.  Atherosclerotic calcifications of thoracic aorta again noted.  No focal infiltrate.  IMPRESSION: Mild interstitial prominence bilaterally without convincing pulmonary edema.  Mild infrahilar bronchitic changes. Atherosclerotic calcifications of thoracic aorta again noted.  No focal infiltrate.   Original Report Authenticated By: Natasha Mead, M.D.     Assessment/Plan  Principal Problem:   COPD exacerbation /Acute bronchitis with Acute hypoxic respiratory failure, patient does not give any oxygen at home currently O2 sats 92% on 2 L  - Admit to telemetry, placed on a scheduled to Xopenex/Atrovent nebs qid, prednisone, levofloxacin - Continue Flonase and Singulair   Active Problems:   HYPERTENSION, UNSPECIFIED: Continue beta blocker    AV BLOCK, COMPLETE: Patient has pacemaker, which was recently checked per her report (her cardiologist is Dr. Sherryl Manges ). Per office notes it was interrogated on 01/13/2012.    Elevated brain  natriuretic peptide (BNP) level -Patient does not have a history of CHF. She had cardiac catheterization demonstrating no obstructive coronary artery disease in August 2012 -  Will give one dose of IV Lasix, obtain 2-D echocardiogram for further workup. If she has developed any significant CHF in the interim, I will consult with Dr. Graciela Husbands. Possibly could have been triggered by acute bronchitis. Obtain serial troponins x2.   DVT prophylaxis:  SCDS  CODE STATUS:  DNR/DNI   Further plan will depend as patient's clinical course evolves and further radiologic and laboratory data become available.   Time Spent on Admission:  1 hour  Patricio Popwell M.D. [facility] 07/21/2012, 12:14 PM Pager: [phone]  If 7PM-7AM, please contact night-coverage [email or web address] Password [email or web address]

## 2012-07-21 NOTE — ED Notes (Signed)
Patient transported to X-ray 

## 2012-07-21 NOTE — ED Provider Notes (Signed)
History     CSN: [id number]  Arrival date & time 07/21/12  4098   First MD Initiated Contact with Patient 07/21/12 [phone]      Chief Complaint  Patient presents with  . Shortness of Breath  . Chest Pain    (Consider location/radiation/quality/duration/timing/severity/associated sxs/prior treatment) HPI Pt presenting with shortness of breath and cough.  She states cough began yesterday which was productive of clear sputum.  No fever/chills.  She used albuterol inhaler approx 4 times with mild relief.  This morning she felt more short of breath and felt jittery and her heart racing.  EMS called, they gave albuterol/atrovent and solumedrol en route.  She felt improved upon arrival to the ED.  Denies chest pain.  No leg swelling.  SOB is worse with exertion.  There are no other associated systemic symptoms, there are no other alleviating or modifying factors.   Past Medical History  Diagnosis Date  . Complete AV block   . Presence of permanent cardiac pacemaker   . Tachycardia   . Hypertension   . Osteopenia   . Osteoarthritis   . COPD (chronic obstructive pulmonary disease)   . Pacemaker   . Shortness of breath   . Pneumonia   . Asthma   . GERD (gastroesophageal reflux disease)     Past Surgical History  Procedure Laterality Date  . Hernia repair  11/2002  . Eye surgery  10/2004  . Pacemaker placement      [employer]  . Cholecystectomy      No family history on file.  History  Substance Use Topics  . Smoking status: Former Smoker    Quit date: 03/16/1987  . Smokeless tobacco: Never Used  . Alcohol Use: No    OB History   Grav Para Term Preterm Abortions TAB SAB Ect Mult Living                  Review of Systems ROS reviewed and all otherwise negative except for mentioned in HPI  Allergies  Hydrocodone-acetaminophen  Home Medications   Current Outpatient Rx  Name  Route  Sig  Dispense  Refill  . albuterol (PROAIR HFA) 108 (90 BASE) MCG/ACT inhaler  Inhalation   Inhale 2 puffs into the lungs every 6 (six) hours as needed. For shortness of breath/wheezing         . ALPRAZolam (XANAX) 0.5 MG tablet   Oral   Take 0.5 mg by mouth at bedtime as needed. For sleep         . azithromycin (ZITHROMAX) 500 MG tablet   Oral   Take 500 mg by mouth daily. starting 07/20/12 for 3 days         . Calcium Carbonate (CALTRATE 600) 1500 MG TABS   Oral   Take 1 tablet by mouth daily.           . cholecalciferol (VITAMIN D) 1000 UNITS tablet   Oral   Take 1,000 Units by mouth daily.           . Coenzyme Q10 (CO Q 10 PO)   Oral   Take 1 capsule by mouth daily.         . Cyanocobalamin (VITAMIN B 12 PO)   Oral   Take 1 tablet by mouth daily.         . fluticasone (FLONASE) 50 MCG/ACT nasal spray   Nasal   Place 2 sprays into the nose daily.         Marland Kitchen  loratadine (CLARITIN) 10 MG tablet   Oral   Take 10 mg by mouth daily.         . metoprolol (LOPRESSOR) 50 MG tablet   Oral   Take 0.5 tablets (25 mg total) by mouth 2 (two) times daily.   30 tablet   6   . Misc Natural Products (OSTEO BI-FLEX ADV JOINT SHIELD PO)   Oral   Take 2 capsules by mouth daily.           . Multiple Vitamins-Minerals (EYE VITAMINS PO)   Oral   Take by mouth daily.           Marland Kitchen ofloxacin (OCUFLOX) 0.3 % ophthalmic solution   Ophthalmic   Apply 1 drop to eye See admin instructions. Used 3 times a day for 3 days after eye injections every 4 to 6 weeks.         . Omega-3 Fatty Acids (FISH OIL) 1200 MG CAPS   Oral   Take 1 capsule by mouth daily.            BP 110/44  Pulse 100  Temp(Src) 98.2 F (36.8 C) (Oral)  Resp 19  SpO2 92% Vitals reviewed Physical Exam Physical Examination: General appearance - alert, well appearing, and in no distress Mental status - alert, oriented to person, place, and time Eyes -  No conjunctival injection, no scleral icterus Mouth - mucous membranes moist, pharynx normal without lesions Chest -  bilateral expiratory wheezes, no rales or rhonchi, symmetric air entry, speaking in full sentences Heart - normal rate, regular rhythm, normal S1, S2, no murmurs, rubs, clicks or gallops Abdomen - soft, nontender, nondistended, no masses or organomegaly Extremities - peripheral pulses normal, no pedal edema, no clubbing or cyanosis Skin - normal coloration and turgor, no rashes  ED Course  Procedures (including critical care time)   Date: 07/21/2012  Rate: 97  Rhythm: normal sinus rhythm  QRS Axis: indeterminate  Intervals: nonpecific IVCD  ST/T Wave abnormalities: nonspecific ST/T changes  Conduction Disutrbances:nonspecific intraventricular conduction delay  Narrative Interpretation:   Old EKG Reviewed: unchanged from prior ekg of 1031/13- except pacer spikes less evident on today's ekg   11:10 AM pt rechecked she is feeling improved, jittery from albuterol, O2 sat approx 92% on  oxygen, lungs with slight wheezing.  All results d/w family and patient at the bedside.   11:32 AM d/w [facility] for admission to tele bed.   Labs Reviewed  CBC - Abnormal; Notable for the following:    Hemoglobin 15.4 (*)    All other components within normal limits  BASIC METABOLIC PANEL - Abnormal; Notable for the following:    Glucose, Bld 144 (*)    GFR calc non Af Amer 62 (*)    GFR calc Af Amer 72 (*)    All other components within normal limits  PRO B NATRIURETIC PEPTIDE - Abnormal; Notable for the following:    Pro B Natriuretic peptide (BNP) 1476.0 (*)    All other components within normal limits  TROPONIN I   Dg Chest 2 View  07/21/2012  *RADIOLOGY REPORT*  Clinical Data: Cough, chest pain  CHEST - 2 VIEW  Comparison: 08/16/2011  Findings: Cardiomediastinal silhouette is stable.  Dual lead cardiac pacemaker is unchanged in position.  Mild interstitial prominence bilaterally without convincing pulmonary edema.  Mild infrahilar bronchitic changes.  Atherosclerotic calcifications of thoracic  aorta again noted.  No focal infiltrate.  IMPRESSION: Mild interstitial prominence bilaterally without convincing pulmonary  edema.  Mild infrahilar bronchitic changes. Atherosclerotic calcifications of thoracic aorta again noted.  No focal infiltrate.   Original Report Authenticated By: Natasha Mead, M.D.      1. COPD exacerbation       MDM  Pt presenting with c/o shortness of breath and cough.  Wheezing on exam, no signs of fluid overload, mild elevation in BNP but I do not think this is clinically relevant to her symptoms.  More likely COPD/bronchitis exacerbation.  Pt feels improved after albuterol/solumedrol.  Levofloxacin started. CXR images reviewed and interpreted by me as well, no significant pulmonary edema, no infiltrate.  D/w [facility] for admission. Pt and family are agreeable with this plan.        Ethelda Chick, MD 07/21/12 1159

## 2012-07-21 NOTE — ED Notes (Signed)
Woke up this am w/ cp and sob pt wheezing upon ems arrival pt has pacemaker but felt her heart was racing given 10 albuteral 1 mg atrovent 125 of solumedrol feeling better  W/ tx going at arrival time resp at bedside

## 2012-07-21 NOTE — ED Notes (Signed)
MD at bedside. 

## 2012-07-22 DIAGNOSIS — J449 Chronic obstructive pulmonary disease, unspecified: Secondary | ICD-10-CM | POA: Diagnosis not present

## 2012-07-22 DIAGNOSIS — Z950 Presence of cardiac pacemaker: Secondary | ICD-10-CM | POA: Diagnosis not present

## 2012-07-22 DIAGNOSIS — J441 Chronic obstructive pulmonary disease with (acute) exacerbation: Secondary | ICD-10-CM | POA: Diagnosis not present

## 2012-07-22 DIAGNOSIS — I442 Atrioventricular block, complete: Secondary | ICD-10-CM | POA: Diagnosis not present

## 2012-07-22 DIAGNOSIS — I369 Nonrheumatic tricuspid valve disorder, unspecified: Secondary | ICD-10-CM

## 2012-07-22 LAB — BASIC METABOLIC PANEL
Chloride: 96 mEq/L (ref 96–112)
Creatinine, Ser: 0.89 mg/dL (ref 0.50–1.10)
GFR calc Af Amer: 65 mL/min — ABNORMAL LOW (ref >90)
Potassium: 4.3 mEq/L (ref 3.5–5.1)
Sodium: 137 mEq/L (ref 135–145)

## 2012-07-22 LAB — CBC
HCT: 42.3 % (ref 36.0–46.0)
RBC: 4.5 MIL/uL (ref 3.87–5.11)
RDW: 13.4 % (ref 11.5–15.5)
WBC: 14.2 10*3/uL — ABNORMAL HIGH (ref 4.0–10.5)

## 2012-07-22 LAB — URINALYSIS, ROUTINE W REFLEX MICROSCOPIC
Glucose, UA: NEGATIVE mg/dL
Protein, ur: NEGATIVE mg/dL
Specific Gravity, Urine: 1.008 (ref 1.005–1.030)

## 2012-07-22 LAB — URINE MICROSCOPIC-ADD ON

## 2012-07-22 MED ORDER — FUROSEMIDE 40 MG PO TABS
40.0000 mg | ORAL_TABLET | Freq: Every day | ORAL | Status: DC
  Administered 2012-07-23 – 2012-07-25 (×3): 40 mg via ORAL
  Filled 2012-07-22 (×4): qty 1

## 2012-07-22 NOTE — Progress Notes (Signed)
  Echocardiogram 2D Echocardiogram has been performed.  Jamie [name] 07/22/2012, 1:29 PM

## 2012-07-22 NOTE — Progress Notes (Signed)
Patient ID: Jamie [name]  female  WUJ:[id number]    DOB: 06-27-1922    DOA: 07/21/2012  PCP: Ezequiel Kayser, MD  Assessment/Plan:  Principal Problem:  COPD exacerbation /Acute bronchitis with Acute hypoxic respiratory failure, patient is not on any oxygen at home currently O2 sats 96% on 2 L, still feels somewhat short of breath on exertion or ambulating to the bathroom  - Continue Xopenex/Atrovent nebs qid, prednisone, levofloxacin  - Continue Flonase and Singulair   Active Problems:  HYPERTENSION, UNSPECIFIED: Continue beta blocker   AV BLOCK, COMPLETE: Patient has pacemaker, which was recently checked per her report (her cardiologist is Dr. Sherryl Manges ). Per office notes it was interrogated on 01/13/2012.   Elevated brain natriuretic peptide (BNP) level  -Patient does not have a history of CHF. She had cardiac catheterization demonstrating no obstructive coronary artery disease in August 2012  - 2-D echocardiogram pending, will add daily weights, I/O's  - Given one dose of IV Lasix, repeat BNP, CXR in AM - Possibly could have been triggered by acute bronchitis, serial cardiac enzymes negative so far.   DVT prophylaxis: SCDS   CODE STATUS: DNR/DNI   Disposition: hopefully tomorrow if stable  Subjective: Still SOB on exertion and ambulating, still feels somewhat jittery after ambulating  Objective: Weight change:   Intake/Output Summary (Last 24 hours) at 07/22/12 1143 Last data filed at 07/21/12 1700  Gross per 24 hour  Intake    240 ml  Output      0 ml  Net    240 ml   Blood pressure 146/54, pulse 88, temperature 98.1 F (36.7 C), temperature source Oral, resp. rate 20, weight 53.978 kg (119 lb), SpO2 96.00%.  Physical Exam: General: Alert and awake, oriented x3, not in any acute distress. HEENT: anicteric sclera, pupils reactive to light and accommodation, , no JVD CVS: S1-S2 clear, no murmur rubs or gallops Chest: Decreased breath sounds but no wheezing  Abdomen:  softNT, ND, NBS  Extremities: no cyanosis, clubbing or edema noted bilaterally   Lab Results: Basic Metabolic Panel:  Recent Labs Lab 07/21/12 0958 07/22/12 0500  NA 139 137  K 3.6 4.3  CL 99 96  CO2 29 32  GLUCOSE 144* 139*  BUN 11 15  CREATININE 0.82 0.89  CALCIUM 9.9 9.3   Liver Function Tests: No results found for this basename: AST, ALT, ALKPHOS, BILITOT, PROT, ALBUMIN,  in the last 168 hours No results found for this basename: LIPASE, AMYLASE,  in the last 168 hours No results found for this basename: AMMONIA,  in the last 168 hours CBC:  Recent Labs Lab 07/21/12 0958 07/22/12 0500  WBC 7.2 14.2*  HGB 15.4* 14.5  HCT 44.6 42.3  MCV 94.9 94.0  PLT 169 179   Cardiac Enzymes:  Recent Labs Lab 07/21/12 0958 07/21/12 1727 07/21/12 2315  TROPONINI <0.30 <0.30 <0.30   BNP: No components found with this basename: POCBNP,  CBG: No results found for this basename: GLUCAP,  in the last 168 hours   Micro Results: No results found for this or any previous visit (from the past 240 hour(s)).  Studies/Results: Dg Chest 2 View  07/21/2012  *RADIOLOGY REPORT*  Clinical Data: Cough, chest pain  CHEST - 2 VIEW  Comparison: 08/16/2011  Findings: Cardiomediastinal silhouette is stable.  Dual lead cardiac pacemaker is unchanged in position.  Mild interstitial prominence bilaterally without convincing pulmonary edema.  Mild infrahilar bronchitic changes.  Atherosclerotic calcifications of thoracic aorta again noted.  No focal infiltrate.  IMPRESSION: Mild interstitial prominence bilaterally without convincing pulmonary edema.  Mild infrahilar bronchitic changes. Atherosclerotic calcifications of thoracic aorta again noted.  No focal infiltrate.   Original Report Authenticated By: Natasha Mead, M.D.     Medications: Scheduled Meds: . ALPRAZolam  0.5 mg Oral QHS  . calcium carbonate  1,250 mg Oral Q breakfast  . cholecalciferol  1,000 Units Oral Daily  . fluticasone  2 spray  Each Nare Daily  . levalbuterol  0.63 mg Nebulization QID   And  . ipratropium  0.5 mg Nebulization QID  . levofloxacin  750 mg Intravenous Q24H  . loratadine  10 mg Oral Daily  . metoprolol  25 mg Oral BID  . montelukast  10 mg Oral QHS  . omega-3 acid ethyl esters  1 g Oral BID  . pantoprazole  40 mg Oral Q0600  . predniSONE  40 mg Oral Q breakfast  . sodium chloride  3 mL Intravenous Q12H  . sodium chloride  3 mL Intravenous Q12H      LOS: 1 day   Angelina Neece M.D. [facility] 07/22/2012, 11:43 AM Pager: [phone]  If 7PM-7AM, please contact night-coverage [email or web address] Password [email or web address]

## 2012-07-23 ENCOUNTER — Inpatient Hospital Stay (HOSPITAL_COMMUNITY): Payer: Medicare Other

## 2012-07-23 DIAGNOSIS — J449 Chronic obstructive pulmonary disease, unspecified: Secondary | ICD-10-CM | POA: Diagnosis not present

## 2012-07-23 DIAGNOSIS — I442 Atrioventricular block, complete: Secondary | ICD-10-CM | POA: Diagnosis not present

## 2012-07-23 DIAGNOSIS — I509 Heart failure, unspecified: Secondary | ICD-10-CM

## 2012-07-23 DIAGNOSIS — J441 Chronic obstructive pulmonary disease with (acute) exacerbation: Secondary | ICD-10-CM | POA: Diagnosis not present

## 2012-07-23 DIAGNOSIS — Z950 Presence of cardiac pacemaker: Secondary | ICD-10-CM | POA: Diagnosis not present

## 2012-07-23 DIAGNOSIS — I5031 Acute diastolic (congestive) heart failure: Secondary | ICD-10-CM

## 2012-07-23 LAB — URINE CULTURE: Colony Count: 2000

## 2012-07-23 MED ORDER — IPRATROPIUM BROMIDE 0.02 % IN SOLN: 0.5000 mg | Freq: Three times a day (TID) | RESPIRATORY_TRACT | Status: DC

## 2012-07-23 MED ORDER — LEVALBUTEROL HCL 0.63 MG/3ML IN NEBU: 0.6300 mg | INHALATION_SOLUTION | Freq: Four times a day (QID) | RESPIRATORY_TRACT | Status: DC | PRN

## 2012-07-23 MED ORDER — LEVOFLOXACIN IN D5W 750 MG/150ML IV SOLN
750.0000 mg | INTRAVENOUS | Status: DC
  Filled 2012-07-23: qty 150

## 2012-07-23 MED ORDER — LEVALBUTEROL HCL 0.63 MG/3ML IN NEBU: 0.6300 mg | INHALATION_SOLUTION | Freq: Three times a day (TID) | RESPIRATORY_TRACT | Status: DC

## 2012-07-23 MED ORDER — IPRATROPIUM BROMIDE 0.02 % IN SOLN
0.5000 mg | Freq: Four times a day (QID) | RESPIRATORY_TRACT | Status: DC | PRN
  Administered 2012-07-24 (×2): 0.5 mg via RESPIRATORY_TRACT
  Filled 2012-07-23 (×3): qty 2.5

## 2012-07-23 MED ORDER — LEVALBUTEROL HCL 0.63 MG/3ML IN NEBU
0.6300 mg | INHALATION_SOLUTION | Freq: Four times a day (QID) | RESPIRATORY_TRACT | Status: DC | PRN
  Administered 2012-07-24 (×2): 0.63 mg via RESPIRATORY_TRACT
  Filled 2012-07-23 (×3): qty 3

## 2012-07-23 MED ORDER — IPRATROPIUM BROMIDE 0.02 % IN SOLN: 0.5000 mg | Freq: Four times a day (QID) | RESPIRATORY_TRACT | Status: DC | PRN

## 2012-07-23 NOTE — Progress Notes (Signed)
SATURATION QUALIFICATIONS: (This note is used to comply with regulatory documentation for home oxygen)  Patient Saturations on Room Air at Rest = 92%  Patient Saturations on Room Air while Ambulating = 80-82 %  Patient Saturations on 1 Liters of oxygen while Ambulating = 95%  Please briefly explain why patient needs home oxygen:  Note by PT: SaOx 80-82% on RA during ambulation, rebound to 90% with pursed lip breathing x45 seconds, dropped again going back to room. Pt asymptomatic. Reapplied Ox per [place] at 1L/min, counseled patient on risks of low oxygen saturation, reported to RN.

## 2012-07-23 NOTE — Progress Notes (Signed)
Patient ID: Jamie [name]  female  WUJ:[id number]    DOB: 10/01/1922    DOA: 07/21/2012  PCP: Ezequiel Kayser, MD  Assessment/Plan:  Principal Problem:  COPD exacerbation /Acute bronchitis with Acute hypoxic respiratory failure, patient is not on any oxygen at home  ambulating without any difficulty  - Continue Xopenex/Atrovent nebs TID, prednisone, levofloxacin  - Continue Flonase and Singulair  - Home O2 evaluation done, patient will likely need O2, O2 sats 80-82% on room air during ambulation  Active Problems:  HYPERTENSION, UNSPECIFIED: Continue beta blocker   AV BLOCK, COMPLETE: Patient has pacemaker, which was recently checked per her report (her cardiologist is Dr. Sherryl Manges ). Per office notes it was interrogated on 01/13/2012.   Acute grade 1 diastolic dysfunction  - She had cardiac catheterization demonstrating no obstructive coronary artery disease in August 2012  - BNP improved today, continue Lasix for now. Will likely reduce dose upon DC to 20mg  daily - cont oral lasix, BNP down to 473 (<-  1476) - Possibly could have been triggered by acute bronchitis, serial cardiac enzymes negative so far.  - 2-D echo done showed EF of 60-65%, grade 1 diastolic dysfunction  DVT prophylaxis: SCDS   CODE STATUS: DNR/DNI   Disposition: hopefully tomorrow if stable  Subjective: Patient was wheezing at the time of my encounter today, had not received breathing treatment after 9 PM last night. Still somewhat short of breath  Objective: Weight change: 1.542 kg (3 lb 6.4 oz)  Intake/Output Summary (Last 24 hours) at 07/23/12 1103 Last data filed at 07/23/12 0900  Gross per 24 hour  Intake    960 ml  Output   1550 ml  Net   -590 ml   Blood pressure 151/74, pulse 74, temperature 97.5 F (36.4 C), temperature source Oral, resp. rate 18, weight 55.52 kg (122 lb 6.4 oz), SpO2 95.00%.  Physical Exam: General: Alert and awake, oriented x3, not in any acute distress. CVS: S1-S2 clear, no  murmur rubs or gallops Chest: Decreased breath sounds with expiratory wheezing Abdomen: softNT, ND, NBS  Extremities: no cyanosis, clubbing or edema noted bilaterally   Lab Results: Basic Metabolic Panel:  Recent Labs Lab 07/21/12 0958 07/22/12 0500  NA 139 137  K 3.6 4.3  CL 99 96  CO2 29 32  GLUCOSE 144* 139*  BUN 11 15  CREATININE 0.82 0.89  CALCIUM 9.9 9.3   Liver Function Tests: No results found for this basename: AST, ALT, ALKPHOS, BILITOT, PROT, ALBUMIN,  in the last 168 hours No results found for this basename: LIPASE, AMYLASE,  in the last 168 hours No results found for this basename: AMMONIA,  in the last 168 hours CBC:  Recent Labs Lab 07/21/12 0958 07/22/12 0500  WBC 7.2 14.2*  HGB 15.4* 14.5  HCT 44.6 42.3  MCV 94.9 94.0  PLT 169 179   Cardiac Enzymes:  Recent Labs Lab 07/21/12 0958 07/21/12 1727 07/21/12 2315  TROPONINI <0.30 <0.30 <0.30   BNP: No components found with this basename: POCBNP,  CBG: No results found for this basename: GLUCAP,  in the last 168 hours   Micro Results: No results found for this or any previous visit (from the past 240 hour(s)).  Studies/Results: Dg Chest 2 View  07/21/2012  *RADIOLOGY REPORT*  Clinical Data: Cough, chest pain  CHEST - 2 VIEW  Comparison: 08/16/2011  Findings: Cardiomediastinal silhouette is stable.  Dual lead cardiac pacemaker is unchanged in position.  Mild interstitial prominence bilaterally without convincing  pulmonary edema.  Mild infrahilar bronchitic changes.  Atherosclerotic calcifications of thoracic aorta again noted.  No focal infiltrate.  IMPRESSION: Mild interstitial prominence bilaterally without convincing pulmonary edema.  Mild infrahilar bronchitic changes. Atherosclerotic calcifications of thoracic aorta again noted.  No focal infiltrate.   Original Report Authenticated By: Natasha Mead, M.D.     Medications: Scheduled Meds: . ALPRAZolam  0.5 mg Oral QHS  . calcium carbonate  1,250  mg Oral Q breakfast  . cholecalciferol  1,000 Units Oral Daily  . fluticasone  2 spray Each Nare Daily  . furosemide  40 mg Oral Daily  . levalbuterol  0.63 mg Nebulization QID   And  . ipratropium  0.5 mg Nebulization QID  . levofloxacin  750 mg Intravenous Q24H  . loratadine  10 mg Oral Daily  . metoprolol  25 mg Oral BID  . montelukast  10 mg Oral QHS  . omega-3 acid ethyl esters  1 g Oral BID  . pantoprazole  40 mg Oral Q0600  . predniSONE  40 mg Oral Q breakfast  . sodium chloride  3 mL Intravenous Q12H  . sodium chloride  3 mL Intravenous Q12H      LOS: 2 days   Marvon Shillingburg M.D. [facility] 07/23/2012, 11:03 AM Pager: [phone]  If 7PM-7AM, please contact night-coverage [email or web address] Password [email or web address]

## 2012-07-23 NOTE — Progress Notes (Addendum)
Pt was assessed at time neb was being given at 1313. Pt had clear/dim bilateral bs, and was in no obvious distress. Pt was assessed to nebs PRN per our protocol. Right before treatment was given, pt ambulated to bathroom unassisted on RA, and showed no signs of distress once back in bed. RT will continue to monitor.

## 2012-07-23 NOTE — Evaluation (Signed)
Physical Therapy Evaluation Patient Details Name: Jamie [name] MRN: [id number] DOB: 1922-07-29 Today's Date: 07/23/2012 Time: 1120-1200 PT Time Calculation (min): 40 min  PT Assessment / Plan / Recommendation Clinical Impression  79 yo presents with at functional baseline, with minimal disruption to gait and mobility.  Most concerning is oxygen saturation levels which dropped to low 80's during ambulation on RA, no noticable DOE... rebound to 90% with pursed lip breathing, unable to sustain on second check levels back to low 80's .  Reapplied 1L per [place] with rebound to mid 90's.  Pt educated on risks of low oxygenation levels and nurse updated on findings to report to MD.  No other issues and no PT needs identified.  Pt is active and exercises 5x/week.  D/c PT.    PT Assessment  Patent does not need any further PT services    Follow Up Recommendations  No PT follow up    Does the patient have the potential to tolerate intense rehabilitation      Barriers to Discharge        Equipment Recommendations  None recommended by PT    Recommendations for Other Services     Frequency      Precautions / Restrictions     Pertinent Vitals/Pain SaOx 80-82% on RA during ambulation, rebound to 90% with pursed lip breathing x45 seconds, dropped again going back to room.  Pt asymptomatic.  Reapplied Ox per [place] at 1L/min, counseled patient on risks of low oxygen saturation, reported to RN.      Mobility  Bed Mobility Bed Mobility: Supine to Sit;Sit to Supine Supine to Sit: 7: Independent Sit to Supine: 7: Independent Transfers Transfers: Sit to Stand;Stand to Sit Sit to Stand: 7: Independent Stand to Sit: 7: Independent Ambulation/Gait Ambulation/Gait Assistance: 7: Independent Ambulation Distance (Feet): 250 Feet Assistive device: None Gait Pattern: Within Functional Limits Stairs: No    Exercises     PT Diagnosis:    PT Problem List:   PT Treatment Interventions:     PT Goals     Visit Information  Last PT Received On: 07/23/12 Assistance Needed: +1    Subjective Data  Subjective: feeling a lot better now Patient Stated Goal: home, independent again   Prior Functioning  Home Living Lives With: Alone Available Help at Discharge: Family Type of Home: House Home Layout: Two level Alternate Level Stairs-Number of Steps: 4 Alternate Level Stairs-Rails: Right Home Adaptive Equipment: None Additional Comments: split level house, able to climb stairs without problems Prior Function Level of Independence: Independent Able to Take Stairs?: Yes Driving: Yes Vocation: Retired Comments: runs all errands unless feeling poorly... generally I Communication Communication: No difficulties Dominant Hand: Right    Cognition  Cognition Arousal/Alertness: Awake/alert Behavior During Therapy: WFL for tasks assessed/performed Overall Cognitive Status: Within Functional Limits for tasks assessed    Extremity/Trunk Assessment Right Upper Extremity Assessment RUE ROM/Strength/Tone: [facility] for tasks assessed Left Upper Extremity Assessment LUE ROM/Strength/Tone: WFL for tasks assessed Right Lower Extremity Assessment RLE ROM/Strength/Tone: WFL for tasks assessed Left Lower Extremity Assessment LLE ROM/Strength/Tone: WFL for tasks assessed Trunk Assessment Trunk Assessment: Normal   Balance Balance Balance Assessed: Yes Standardized Balance Assessment Standardized Balance Assessment: Dynamic Gait Index Dynamic Gait Index Level Surface: Normal Change in Gait Speed: Normal Gait with Horizontal Head Turns: Normal Gait with Vertical Head Turns: Mild Impairment Gait and Pivot Turn: Normal Step Over Obstacle: Normal Step Around Obstacles: Normal Steps: Normal Total Score: 23  End of Session PT -  End of Session Activity Tolerance: Patient tolerated treatment well Patient left: in chair;with call bell/phone within reach Nurse Communication: Mobility status (O2 levels  RA vs. 1L O2)  GP     Dennis Bast 07/23/2012, 12:44 PM

## 2012-07-24 DIAGNOSIS — J441 Chronic obstructive pulmonary disease with (acute) exacerbation: Secondary | ICD-10-CM | POA: Diagnosis not present

## 2012-07-24 DIAGNOSIS — I5031 Acute diastolic (congestive) heart failure: Secondary | ICD-10-CM | POA: Diagnosis not present

## 2012-07-24 DIAGNOSIS — Z950 Presence of cardiac pacemaker: Secondary | ICD-10-CM | POA: Diagnosis not present

## 2012-07-24 DIAGNOSIS — I442 Atrioventricular block, complete: Secondary | ICD-10-CM | POA: Diagnosis not present

## 2012-07-24 LAB — BASIC METABOLIC PANEL
GFR calc Af Amer: 60 mL/min — ABNORMAL LOW (ref >90)
GFR calc non Af Amer: 52 mL/min — ABNORMAL LOW (ref >90)
Potassium: 4.2 mEq/L (ref 3.5–5.1)
Sodium: 137 mEq/L (ref 135–145)

## 2012-07-24 MED ORDER — MENTHOL 3 MG MT LOZG
1.0000 | LOZENGE | OROMUCOSAL | Status: DC | PRN
  Filled 2012-07-24: qty 9

## 2012-07-24 MED ORDER — BENZONATATE 100 MG PO CAPS
100.0000 mg | ORAL_CAPSULE | Freq: Three times a day (TID) | ORAL | Status: DC
  Administered 2012-07-24 – 2012-07-25 (×3): 100 mg via ORAL
  Filled 2012-07-24 (×7): qty 1

## 2012-07-24 NOTE — Progress Notes (Signed)
Patient ID: Jamie [name]  female  ZOX:[id number]    DOB: 18-Aug-1922    DOA: 07/21/2012  PCP: Ezequiel Kayser, MD  Assessment/Plan:  Principal Problem:  COPD exacerbation /Acute bronchitis with Acute hypoxic respiratory failure, patient is not on any oxygen at home, slowly improving, feels a lot tired today, coughing spasms  - Continue Xopenex/Atrovent nebs TID, prednisone, levofloxacin  - Continue Flonase and Singulair  - Home O2 evaluation done, patient will likely need O2, O2 sats 80-82% on room air during ambulation  Active Problems:  HYPERTENSION, UNSPECIFIED: Continue beta blocker   AV BLOCK, COMPLETE: Patient has pacemaker, which was recently checked per her report (her cardiologist is Dr. Sherryl Manges ). Per office notes it was interrogated on 01/13/2012.   Acute grade 1 diastolic dysfunction - Possibly could have been triggered by acute bronchitis, serial cardiac enzymes negative so far.  - She had cardiac catheterization demonstrating no obstructive coronary artery disease in August 2012  - continue Lasix for now, weight down 4lbs today, check BMET today - 2-D echo done showed EF of 60-65%, grade 1 diastolic dysfunction  DVT prophylaxis: SCDS   CODE STATUS: DNR/DNI   Disposition: In a.m. Will arrange Home O2, nebs, RN follow-up, PT. Patient will stay with younger daughter for a little while. Discussed in detail with patient's 2 daughters at bedside   Subjective: Doesn't feel quite well today, fatigued with coughing spells.   Objective: Weight change: -1.92 kg (-4 lb 3.7 oz)  Intake/Output Summary (Last 24 hours) at 07/24/12 1119 Last data filed at 07/24/12 0500  Gross per 24 hour  Intake    840 ml  Output    550 ml  Net    290 ml   Blood pressure 161/79, pulse 68, temperature 98.1 F (36.7 C), temperature source Oral, resp. rate 18, weight 53.6 kg (118 lb 2.7 oz), SpO2 93.00%.  Physical Exam: General: Alert and awake, oriented x3, fragile and appears  deconditioned CVS: S1-S2 clear, no murmur rubs or gallops Chest: No wheezing or rhonchi Abdomen: soft NT, ND, NBS  Extremities: no cyanosis, clubbing or edema noted bilaterally   Lab Results: Basic Metabolic Panel:  Recent Labs Lab 07/21/12 0958 07/22/12 0500  NA 139 137  K 3.6 4.3  CL 99 96  CO2 29 32  GLUCOSE 144* 139*  BUN 11 15  CREATININE 0.82 0.89  CALCIUM 9.9 9.3   CBC:  Recent Labs Lab 07/21/12 0958 07/22/12 0500  WBC 7.2 14.2*  HGB 15.4* 14.5  HCT 44.6 42.3  MCV 94.9 94.0  PLT 169 179   Cardiac Enzymes:  Recent Labs Lab 07/21/12 0958 07/21/12 1727 07/21/12 2315  TROPONINI <0.30 <0.30 <0.30   BNP: No components found with this basename: POCBNP,  CBG: No results found for this basename: GLUCAP,  in the last 168 hours   Micro Results: Recent Results (from the past 240 hour(s))  URINE CULTURE     Status: None   Collection Time    07/22/12  2:54 PM      Result Value Range Status   Specimen Description URINE, CLEAN CATCH   Final   Special Requests NONE   Final   Culture  Setup Time 07/22/2012 20:02   Final   Colony Count 2,000 COLONIES/ML   Final   Culture INSIGNIFICANT GROWTH   Final   Report Status 07/23/2012 FINAL   Final    Studies/Results: Dg Chest 2 View  07/21/2012  *RADIOLOGY REPORT*  Clinical Data: Cough, chest pain  CHEST - 2 VIEW  Comparison: 08/16/2011  Findings: Cardiomediastinal silhouette is stable.  Dual lead cardiac pacemaker is unchanged in position.  Mild interstitial prominence bilaterally without convincing pulmonary edema.  Mild infrahilar bronchitic changes.  Atherosclerotic calcifications of thoracic aorta again noted.  No focal infiltrate.  IMPRESSION: Mild interstitial prominence bilaterally without convincing pulmonary edema.  Mild infrahilar bronchitic changes. Atherosclerotic calcifications of thoracic aorta again noted.  No focal infiltrate.   Original Report Authenticated By: Natasha Mead, M.D.      Medications: Scheduled Meds: . ALPRAZolam  0.5 mg Oral QHS  . calcium carbonate  1,250 mg Oral Q breakfast  . cholecalciferol  1,000 Units Oral Daily  . fluticasone  2 spray Each Nare Daily  . furosemide  40 mg Oral Daily  . [START ON 07/25/2012] levofloxacin  750 mg Intravenous Q48H  . loratadine  10 mg Oral Daily  . metoprolol  25 mg Oral BID  . montelukast  10 mg Oral QHS  . omega-3 acid ethyl esters  1 g Oral BID  . pantoprazole  40 mg Oral Q0600  . predniSONE  40 mg Oral Q breakfast  . sodium chloride  3 mL Intravenous Q12H  . sodium chloride  3 mL Intravenous Q12H      LOS: 3 days   RAI,RIPUDEEP M.D. [facility] 07/24/2012, 11:19 AM Pager: [phone]  If 7PM-7AM, please contact night-coverage [email or web address] Password [email or web address]

## 2012-07-25 ENCOUNTER — Encounter (HOSPITAL_COMMUNITY): Payer: Self-pay | Admitting: General Practice

## 2012-07-25 DIAGNOSIS — J9600 Acute respiratory failure, unspecified whether with hypoxia or hypercapnia: Secondary | ICD-10-CM | POA: Diagnosis not present

## 2012-07-25 DIAGNOSIS — I5031 Acute diastolic (congestive) heart failure: Secondary | ICD-10-CM | POA: Diagnosis present

## 2012-07-25 DIAGNOSIS — J441 Chronic obstructive pulmonary disease with (acute) exacerbation: Secondary | ICD-10-CM | POA: Diagnosis not present

## 2012-07-25 DIAGNOSIS — R799 Abnormal finding of blood chemistry, unspecified: Secondary | ICD-10-CM | POA: Diagnosis not present

## 2012-07-25 LAB — BASIC METABOLIC PANEL
Chloride: 96 mEq/L (ref 96–112)
GFR calc Af Amer: 69 mL/min — ABNORMAL LOW (ref >90)
Potassium: 3.4 mEq/L — ABNORMAL LOW (ref 3.5–5.1)

## 2012-07-25 LAB — CBC
HCT: 45.1 % (ref 36.0–46.0)
Hemoglobin: 14.9 g/dL (ref 12.0–15.0)
RDW: 13.6 % (ref 11.5–15.5)
WBC: 11.1 10*3/uL — ABNORMAL HIGH (ref 4.0–10.5)

## 2012-07-25 MED ORDER — POTASSIUM CHLORIDE CRYS ER 20 MEQ PO TBCR
40.0000 meq | EXTENDED_RELEASE_TABLET | Freq: Once | ORAL | Status: AC
  Administered 2012-07-25: 40 meq via ORAL

## 2012-07-25 MED ORDER — GUAIFENESIN-DM 100-10 MG/5ML PO SYRP: 5.0000 mL | ORAL_SOLUTION | ORAL | Status: DC | PRN

## 2012-07-25 MED ORDER — FUROSEMIDE 20 MG PO TABS: 20.0000 mg | ORAL_TABLET | Freq: Two times a day (BID) | ORAL | Status: DC

## 2012-07-25 MED ORDER — FUROSEMIDE 20 MG PO TABS: 20.0000 mg | ORAL_TABLET | Freq: Every day | ORAL | Status: DC

## 2012-07-25 MED ORDER — PREDNISONE (PAK) 10 MG PO TABS: ORAL_TABLET | ORAL | Status: DC

## 2012-07-25 MED ORDER — PANTOPRAZOLE SODIUM 40 MG PO TBEC: 40.0000 mg | DELAYED_RELEASE_TABLET | Freq: Every day | ORAL | Status: DC

## 2012-07-25 MED ORDER — LEVOFLOXACIN 750 MG PO TABS: 750.0000 mg | ORAL_TABLET | Freq: Every day | ORAL | Status: DC

## 2012-07-25 MED ORDER — LEVALBUTEROL HCL 0.63 MG/3ML IN NEBU: 0.6300 mg | INHALATION_SOLUTION | Freq: Two times a day (BID) | RESPIRATORY_TRACT | Status: DC

## 2012-07-25 MED ORDER — MONTELUKAST SODIUM 10 MG PO TABS: 10.0000 mg | ORAL_TABLET | Freq: Every day | ORAL | Status: DC

## 2012-07-25 MED ORDER — LEVOFLOXACIN 750 MG PO TABS
750.0000 mg | ORAL_TABLET | Freq: Every day | ORAL | Status: DC
  Administered 2012-07-25: 750 mg via ORAL
  Filled 2012-07-25: qty 1

## 2012-07-25 MED ORDER — BENZONATATE 100 MG PO CAPS: 100.0000 mg | ORAL_CAPSULE | Freq: Three times a day (TID) | ORAL | Status: DC | PRN

## 2012-07-25 NOTE — Progress Notes (Signed)
SATURATION QUALIFICATIONS: (This note is used to comply with regulatory documentation for home oxygen)  Patient Saturations on Room Air at Rest = 91 %  Patient Saturations on Room Air while Ambulating = 79 %  Patient Saturations on 2 Liters of oxygen while Ambulating = 93%  Please briefly explain why patient needs home oxygen: pt saturation drooped to 79 % while ambulated on room air.

## 2012-07-25 NOTE — Care Management Note (Signed)
    Page 1 of 2   07/25/2012     9:58:11 AM   CARE MANAGEMENT NOTE 07/25/2012  Patient:  Jamie [name], Jamie [name]   Account Number:  [id number]  Date Initiated:  07/25/2012  Documentation initiated by:  GRAVES-BIGELOW,Tadao Emig  Subjective/Objective Assessment:   Pt admitted with COPD exacerbation. Pt to be d/c home today with daughter in [place] and [facility] services to be set up.     Action/Plan:   CM did speak to daughter yesterday and she stated that [facility] was choice for  services. Referral made for services. SOC to begin within 24-48 hours post d/c.   Anticipated DC Date:  07/25/2012   Anticipated DC Plan:  HOME W HOME HEALTH SERVICES      DC Planning Services  CM consult      PAC Choice  DURABLE MEDICAL EQUIPMENT  HOME HEALTH   Choice offered to / List presented to:  C-4 Adult Children   DME arranged  NEBULIZER MACHINE  OXYGEN      DME agency  [facility]     [facility] arranged  HH-1 RN  HH-10 DISEASE MANAGEMENT  HH-2 PT      HH agency  [facility]   Status of service:  Completed, signed off Medicare Important Message given?   (If response is "NO", the following Medicare IM given date fields will be blank) Date Medicare IM given:   Date Additional Medicare IM given:    Discharge Disposition:  HOME W HOME HEALTH SERVICES  Per UR Regulation:  Reviewed for med. necessity/level of care/duration of stay  If discussed at Long Length of Stay Meetings, dates discussed:    Comments:  CM will have the RN to do another ambulating saturation to see if pt will qualify for 02 for home. Nebulizer will be supplied by [facility] also.

## 2012-07-25 NOTE — Discharge Summary (Signed)
Physician Discharge Summary  Patient ID: Jamie [name] MRN: [id number] DOB/AGE: Feb 27, 1923 79 y.o.  Admit date: 07/21/2012 Discharge date: 07/25/2012  Primary Care Physician:  Ezequiel Kayser, MD  Discharge Diagnoses:    . COPD exacerbation/acute bronchitis- improving  . HYPERTENSION, UNSPECIFIED . AV BLOCK, COMPLETE with history of pacemaker  . Elevated brain natriuretic peptide (BNP) level with acute diastolic CHF  . Acute respiratory failure with hypoxia, started on 2 L O2 via nasal cannula on exertion  . Acute diastolic heart failure  Consults: None   Recommendations for Outpatient Follow-up:  1) please check BMET/renal function on hospital follow up appointment, patient is started on Lasix 2 ) pulmonology outpatient appointment is set up with Dr.Clance on June 5th at 10:30am  Allergies:   Allergies  Allergen Reactions  . Hydrocodone-Acetaminophen     REACTION: GI distress     Discharge Medications:   Medication List    STOP taking these medications       azithromycin 500 MG tablet  Commonly known as:  ZITHROMAX      TAKE these medications       ALPRAZolam 0.5 MG tablet  Commonly known as:  XANAX  Take 0.5 mg by mouth at bedtime as needed. For sleep     benzonatate 100 MG capsule  Commonly known as:  TESSALON  Take 1 capsule (100 mg total) by mouth 3 (three) times daily as needed for cough. For cough     CALTRATE 600 1500 MG Tabs  Generic drug:  Calcium Carbonate  Take 1 tablet by mouth daily.     cholecalciferol 1000 UNITS tablet  Commonly known as:  VITAMIN D  Take 1,000 Units by mouth daily.     CO Q 10 PO  Take 1 capsule by mouth daily.     EYE VITAMINS PO  Take by mouth daily.     Fish Oil 1200 MG Caps  Take 1 capsule by mouth daily.     fluticasone 50 MCG/ACT nasal spray  Commonly known as:  FLONASE  Place 2 sprays into the nose daily.     furosemide 20 MG tablet  Commonly known as:  LASIX  Take 1 tablet (20 mg total) by mouth daily.     guaiFENesin-dextromethorphan 100-10 MG/5ML syrup  Commonly known as:  ROBITUSSIN DM  Take 5 mLs by mouth every 4 (four) hours as needed for cough. Also available over the counter     levalbuterol 0.63 MG/3ML nebulizer solution  Commonly known as:  XOPENEX  Take 3 mLs (0.63 mg total) by nebulization 2 (two) times daily. or every 6hours if needed for wheezing or shortness of breath     levofloxacin 750 MG tablet  Commonly known as:  LEVAQUIN  Take 1 tablet (750 mg total) by mouth daily. X 4 more days     loratadine 10 MG tablet  Commonly known as:  CLARITIN  Take 10 mg by mouth daily.     metoprolol 50 MG tablet  Commonly known as:  LOPRESSOR  Take 0.5 tablets (25 mg total) by mouth 2 (two) times daily.     montelukast 10 MG tablet  Commonly known as:  SINGULAIR  Take 1 tablet (10 mg total) by mouth at bedtime.     ofloxacin 0.3 % ophthalmic solution  Commonly known as:  OCUFLOX  Apply 1 drop to eye See admin instructions. Used 3 times a day for 3 days after eye injections every 4 to 6 weeks.  OSTEO BI-FLEX ADV JOINT SHIELD PO  Take 2 capsules by mouth daily.     pantoprazole 40 MG tablet  Commonly known as:  PROTONIX  Take 1 tablet (40 mg total) by mouth daily. Also available over the counter     predniSONE 10 MG tablet  Commonly known as:  STERAPRED UNI-PAK  Prednisone dosing: Take  Prednisone 30mg  (3 tabs) x 3 days, then 20mg  (2 tabs) x 3days, then 10mg  (1 tab) x 3days, then OFF.    Dispense:  18 tabs, refills: None     PROAIR HFA 108 (90 BASE) MCG/ACT inhaler  Generic drug:  albuterol  Inhale 2 puffs into the lungs every 6 (six) hours as needed. For shortness of breath/wheezing     VITAMIN B 12 PO  Take 1 tablet by mouth daily.         Brief H and P: For complete details please refer to admission H and P, but in brief Patient is 79 year old female with history of COPD, pacemaker secondary to complete AV block, GERD, asthma, osteoarthritis presented with  shortness of breath and coughing for the last 2 weeks, worsened in the last 1 week. Patient reports that she was having allergies and getting short of breath in the last 2 weeks however yesterday she started coughing, productive with clear sputum. She had no fevers but chills and was using her albuterol inhaler every 4 hours without any significant relief. On the day of admission, she felt jittery, more short of breath, palpitations hence presented to ED. EMS gave her albuterol/Atrovent nebs, Solu-Medrol en route.  ED workup also showed troponin less than 0.3, BNP elevated at 1476, chest x-ray showed mild interstitial prominence bilaterally without convincing pulmonary edema, mild bronchitic changes   Hospital Course:   COPD exacerbation /Acute bronchitis with Acute hypoxic respiratory failure, patient was not on any oxygen at home prior to admission. The patient was wheezing at the time of admission. She was placed on scheduled nebulizer treatments, prednisone, levofloxacin. Her wheezing has significantly improved. Home O2 evaluation was done and patient will need 2 liters O2 on exertion. Sats on ambulation had gone down to 79% improved to 93% on 2 L. Continue Flonase and Singulair  Outpatient pulmonology followup with Dr. Shelle Iron is also set up on June 5th at 10:30 AM.  HYPERTENSION, UNSPECIFIED: Continue beta blocker   AV BLOCK, COMPLETE: Patient has pacemaker, which was recently checked per her report (her cardiologist is Dr. Sherryl Manges). Per office notes it was interrogated on 01/13/2012.   Acute grade 1 diastolic dysfunction - Possibly could have been triggered by acute bronchitis, serial cardiac enzymes remained negative. She had cardiac catheterization demonstrating no obstructive coronary artery disease in August 2012. BNP was elevated at 1476 at the time of admission, came down to 473 with Lasix. 2-D echo done showed EF of 60-65%, grade 1 diastolic dysfunction. I have reduced her Lasix dose  to 20 mg daily at this time to avoid dehydration. I explained to the patient and her daughter in detail that her Lasix dose will be adjusted by PCP, Dr. Waynard Edwards according to her renal function and overall medical condition at the time of followup. Home physical therapy, RN followup were arranged by the case manager, patient will be staying with her daughter in [place]. Her O2 and nebulizers were arranged by the case manager.   Day of Discharge BP 124/70  Pulse 80  Temp(Src) 98.4 F (36.9 C) (Oral)  Resp 18  Wt 52.9 kg (116 lb  10 oz)  BMI 14.58 kg/m2  SpO2 97%  Physical Exam: General: Alert and awake oriented x3 not in any acute distress. CVS: S1-S2 clear no murmur rubs or gallops Chest: clear to auscultation bilaterally, no wheezing rales or rhonchi Abdomen: soft nontender, nondistended, normal bowel sounds Extremities: no cyanosis, clubbing or edema noted bilaterally Neuro: Cranial nerves II-XII intact, no focal neurological deficits   The results of significant diagnostics from this hospitalization (including imaging, microbiology, ancillary and laboratory) are listed below for reference.    LAB RESULTS: Basic Metabolic Panel:  Recent Labs Lab 07/24/12 1134 07/25/12 0525  NA 137 137  K 4.2 3.4*  CL 98 96  CO2 34* 34*  GLUCOSE 107* 79  BUN 23 27*  CREATININE 0.95 0.85  CALCIUM 9.4 9.2   CBC:  Recent Labs Lab 07/22/12 0500 07/25/12 0525  WBC 14.2* 11.1*  HGB 14.5 14.9  HCT 42.3 45.1  MCV 94.0 96.4  PLT 179 181   Cardiac Enzymes:  Recent Labs Lab 07/21/12 1727 07/21/12 2315  TROPONINI <0.30 <0.30   BNP: No components found with this basename: POCBNP,  CBG: No results found for this basename: GLUCAP,  in the last 168 hours  Significant Diagnostic Studies:  Dg Chest 2 View  07/21/2012  *RADIOLOGY REPORT*  Clinical Data: Cough, chest pain  CHEST - 2 VIEW  Comparison: 08/16/2011  Findings: Cardiomediastinal silhouette is stable.  Dual lead cardiac  pacemaker is unchanged in position.  Mild interstitial prominence bilaterally without convincing pulmonary edema.  Mild infrahilar bronchitic changes.  Atherosclerotic calcifications of thoracic aorta again noted.  No focal infiltrate.  IMPRESSION: Mild interstitial prominence bilaterally without convincing pulmonary edema.  Mild infrahilar bronchitic changes. Atherosclerotic calcifications of thoracic aorta again noted.  No focal infiltrate.   Original Report Authenticated By: Natasha Mead, M.D.     2D ECHO: Study Conclusions  - Left ventricle: The cavity size was normal. Wall thickness was increased in a pattern of mild LVH. Systolic function was normal. The estimated ejection fraction was in the range of 60% to 65%. Wall motion was normal; there were no regional wall motion abnormalities. Doppler parameters are consistent with abnormal left ventricular relaxation (grade 1 diastolic dysfunction).    Disposition and Follow-up: Discharge Orders   Future Appointments Provider Department Dept Phone   08/17/2012 10:30 AM Barbaraann Share, MD  Pulmonary Care [phone]   Future Orders Complete By Expires     Diet - low sodium heart healthy  As directed     Discharge instructions  As directed     Comments:      1)  please use oxygen 2 L with exertion.  2) continue nebulizer treatments twice a day, can use every 6 hours as needed if wheezing or short breath.  3) use albuterol inhaler outside or in between the nebulizer treatments if needed 4) hold Lasix if dizzy or lightheaded.    Increase activity slowly  As directed         DISPOSITION: Home with home PT, RN followup DIET: Heart healthy diet  ACTIVITY: As tolerated TESTS THAT NEED FOLLOW-UP BMET at the time of followup appointment  DISCHARGE FOLLOW-UP Follow-up Information   Follow up with PERINI,MARK A, MD. Schedule an appointment as soon as possible for a visit in 10 days. (please have renal function (BMET) checked on appt)     Contact information:   [address] [place] [address] [phone]       Follow up with Barbaraann Share, MD  On 08/17/2012. (at 10:30AM. )    Contact information:   [address] [place] [address] [phone]       Time spent on Discharge: 45 minutes  Signed:   Jaimi Belle M.D. [facility] 07/25/2012, 12:52 PM Pager: [phone]

## 2012-07-25 NOTE — Progress Notes (Signed)
Patient Saturations on Room Air at Rest = 91%  Patient Saturations on Room Air while Ambulating = 79-83 %  After putting patient back on 2 l oxygen, saturation came back up to 93 %

## 2012-07-25 NOTE — Care Management (Signed)
CM called Rx's to [facility] on [address]. Family and RN is aware that Rx's will be ready in at least one hour thirty minutes. DME has been delivered to room. No further needs from CM at this time. Gala Lewandowsky, RN,BSN [phone]

## 2012-07-26 DIAGNOSIS — K219 Gastro-esophageal reflux disease without esophagitis: Secondary | ICD-10-CM | POA: Diagnosis not present

## 2012-07-26 DIAGNOSIS — J209 Acute bronchitis, unspecified: Secondary | ICD-10-CM | POA: Diagnosis not present

## 2012-07-26 DIAGNOSIS — I509 Heart failure, unspecified: Secondary | ICD-10-CM | POA: Diagnosis not present

## 2012-07-26 DIAGNOSIS — I10 Essential (primary) hypertension: Secondary | ICD-10-CM | POA: Diagnosis not present

## 2012-07-26 DIAGNOSIS — I5031 Acute diastolic (congestive) heart failure: Secondary | ICD-10-CM | POA: Diagnosis not present

## 2012-07-26 DIAGNOSIS — M159 Polyosteoarthritis, unspecified: Secondary | ICD-10-CM | POA: Diagnosis not present

## 2012-07-26 DIAGNOSIS — J45909 Unspecified asthma, uncomplicated: Secondary | ICD-10-CM | POA: Diagnosis not present

## 2012-07-26 DIAGNOSIS — J441 Chronic obstructive pulmonary disease with (acute) exacerbation: Secondary | ICD-10-CM | POA: Diagnosis not present

## 2012-07-26 DIAGNOSIS — Z9981 Dependence on supplemental oxygen: Secondary | ICD-10-CM | POA: Diagnosis not present

## 2012-07-26 DIAGNOSIS — Z950 Presence of cardiac pacemaker: Secondary | ICD-10-CM | POA: Diagnosis not present

## 2012-07-26 MED ORDER — LEVOFLOXACIN 750 MG PO TABS: 750.0000 mg | ORAL_TABLET | Freq: Every day | ORAL | Status: DC

## 2012-07-28 DIAGNOSIS — J45909 Unspecified asthma, uncomplicated: Secondary | ICD-10-CM | POA: Diagnosis not present

## 2012-07-28 DIAGNOSIS — I5031 Acute diastolic (congestive) heart failure: Secondary | ICD-10-CM | POA: Diagnosis not present

## 2012-07-28 DIAGNOSIS — M159 Polyosteoarthritis, unspecified: Secondary | ICD-10-CM | POA: Diagnosis not present

## 2012-07-28 DIAGNOSIS — J209 Acute bronchitis, unspecified: Secondary | ICD-10-CM | POA: Diagnosis not present

## 2012-07-28 DIAGNOSIS — I509 Heart failure, unspecified: Secondary | ICD-10-CM | POA: Diagnosis not present

## 2012-07-28 DIAGNOSIS — J441 Chronic obstructive pulmonary disease with (acute) exacerbation: Secondary | ICD-10-CM | POA: Diagnosis not present

## 2012-08-01 DIAGNOSIS — J441 Chronic obstructive pulmonary disease with (acute) exacerbation: Secondary | ICD-10-CM | POA: Diagnosis not present

## 2012-08-01 DIAGNOSIS — I5031 Acute diastolic (congestive) heart failure: Secondary | ICD-10-CM | POA: Diagnosis not present

## 2012-08-01 DIAGNOSIS — J45909 Unspecified asthma, uncomplicated: Secondary | ICD-10-CM | POA: Diagnosis not present

## 2012-08-01 DIAGNOSIS — I509 Heart failure, unspecified: Secondary | ICD-10-CM | POA: Diagnosis not present

## 2012-08-01 DIAGNOSIS — M159 Polyosteoarthritis, unspecified: Secondary | ICD-10-CM | POA: Diagnosis not present

## 2012-08-01 DIAGNOSIS — J209 Acute bronchitis, unspecified: Secondary | ICD-10-CM | POA: Diagnosis not present

## 2012-08-02 DIAGNOSIS — H35059 Retinal neovascularization, unspecified, unspecified eye: Secondary | ICD-10-CM | POA: Diagnosis not present

## 2012-08-02 DIAGNOSIS — H353290 Exudative age-related macular degeneration, unspecified eye, stage unspecified: Secondary | ICD-10-CM | POA: Diagnosis not present

## 2012-08-03 DIAGNOSIS — J209 Acute bronchitis, unspecified: Secondary | ICD-10-CM | POA: Diagnosis not present

## 2012-08-03 DIAGNOSIS — M159 Polyosteoarthritis, unspecified: Secondary | ICD-10-CM | POA: Diagnosis not present

## 2012-08-03 DIAGNOSIS — I5031 Acute diastolic (congestive) heart failure: Secondary | ICD-10-CM | POA: Diagnosis not present

## 2012-08-03 DIAGNOSIS — J45909 Unspecified asthma, uncomplicated: Secondary | ICD-10-CM | POA: Diagnosis not present

## 2012-08-03 DIAGNOSIS — J441 Chronic obstructive pulmonary disease with (acute) exacerbation: Secondary | ICD-10-CM | POA: Diagnosis not present

## 2012-08-03 DIAGNOSIS — I509 Heart failure, unspecified: Secondary | ICD-10-CM | POA: Diagnosis not present

## 2012-08-10 DIAGNOSIS — I519 Heart disease, unspecified: Secondary | ICD-10-CM | POA: Diagnosis not present

## 2012-08-10 DIAGNOSIS — Z1331 Encounter for screening for depression: Secondary | ICD-10-CM | POA: Diagnosis not present

## 2012-08-10 DIAGNOSIS — M159 Polyosteoarthritis, unspecified: Secondary | ICD-10-CM | POA: Diagnosis not present

## 2012-08-10 DIAGNOSIS — R0602 Shortness of breath: Secondary | ICD-10-CM | POA: Diagnosis not present

## 2012-08-10 DIAGNOSIS — J449 Chronic obstructive pulmonary disease, unspecified: Secondary | ICD-10-CM | POA: Diagnosis not present

## 2012-08-10 DIAGNOSIS — I509 Heart failure, unspecified: Secondary | ICD-10-CM | POA: Diagnosis not present

## 2012-08-10 DIAGNOSIS — Z950 Presence of cardiac pacemaker: Secondary | ICD-10-CM | POA: Diagnosis not present

## 2012-08-10 DIAGNOSIS — Z79899 Other long term (current) drug therapy: Secondary | ICD-10-CM | POA: Diagnosis not present

## 2012-08-10 DIAGNOSIS — I5031 Acute diastolic (congestive) heart failure: Secondary | ICD-10-CM | POA: Diagnosis not present

## 2012-08-10 DIAGNOSIS — J45909 Unspecified asthma, uncomplicated: Secondary | ICD-10-CM | POA: Diagnosis not present

## 2012-08-10 DIAGNOSIS — IMO0002 Reserved for concepts with insufficient information to code with codable children: Secondary | ICD-10-CM | POA: Diagnosis not present

## 2012-08-10 DIAGNOSIS — J441 Chronic obstructive pulmonary disease with (acute) exacerbation: Secondary | ICD-10-CM | POA: Diagnosis not present

## 2012-08-10 DIAGNOSIS — J209 Acute bronchitis, unspecified: Secondary | ICD-10-CM | POA: Diagnosis not present

## 2012-08-10 DIAGNOSIS — I10 Essential (primary) hypertension: Secondary | ICD-10-CM | POA: Diagnosis not present

## 2012-08-17 ENCOUNTER — Encounter: Payer: Self-pay | Admitting: Pulmonary Disease

## 2012-08-17 ENCOUNTER — Ambulatory Visit (INDEPENDENT_AMBULATORY_CARE_PROVIDER_SITE_OTHER): Payer: Medicare Other | Admitting: Pulmonary Disease

## 2012-08-17 VITALS — BP 140/80 | HR 65 | Temp 97.7°F | Ht 63.5 in | Wt 121.8 lb

## 2012-08-17 DIAGNOSIS — J4489 Other specified chronic obstructive pulmonary disease: Secondary | ICD-10-CM

## 2012-08-17 DIAGNOSIS — J449 Chronic obstructive pulmonary disease, unspecified: Secondary | ICD-10-CM

## 2012-08-17 NOTE — Assessment & Plan Note (Signed)
The patient has been given the diagnosis of COPD, but she has never had pulmonary function studies.  I suspect she does have some degree of obstructive lung disease given her smoking history and hyperinflation on her chest x-ray, but it is unclear whether it is enough to warrant maintenance bronchodilator therapy.  I would like to schedule her for full PFTs, and didn't see her back to go over the results.  She obviously desaturates at night and with activity, and I would like her to continue with oxygen therapy during these times.  It will also be important to keep her volume status/diastolic dysfunction well controlled so that her quality of life can be maximized.

## 2012-08-17 NOTE — Patient Instructions (Addendum)
Will schedule for breathing studies, and see you back same day to review with you. You will need to wear oxygen at night sleeping and with exertion.  Do not need at rest.  For now, stay on albuterol as needed.

## 2012-08-17 NOTE — Progress Notes (Signed)
  Subjective:    Patient ID: Jamie [name], female    DOB: [birth year], 79 y.o.   MRN: [id number]  HPI The patient is an 79 year old female who I've been asked to see for possible COPD.  The patient has a history of smoking for 35 years, but has not done so since 1989.  She has been given the diagnosis of COPD, but has never had pulmonary function studies.  She has tried Advair in the past and did not like it, and currently is on as needed beta agonist.  She was recently in the hospital the beginning of May for worsening shortness of breath, and her chest x-ray at that time showed prominent bronchovascular markings and hyperinflation.  She also had a very elevated brain nature reticulocyte peptide.  She was diuresed, and also treated as a COPD exacerbation with improvement in her symptoms.  She had an echocardiogram during this time that showed LVH, diastolic dysfunction, and mild pulmonary hypertension.  She has been discharged home on as needed beta agonist, and also oxygen 24 hours a day.  She just had overnight oximetry that showed significant desaturation on room air.  The patient describes a less than one block dyspnea on exertion, but feels that she does okay with stairs.  She denies getting winded making a bed.  She denies a chronic cough, nor has she had significant lower extremity edema.   Review of Systems  Constitutional: Positive for unexpected weight change. Negative for fever.  HENT: Negative for ear pain, nosebleeds, congestion, sore throat, rhinorrhea, sneezing, trouble swallowing, dental problem, postnasal drip and sinus pressure.   Eyes: Negative for redness and itching.  Respiratory: Positive for shortness of breath. Negative for cough, chest tightness and wheezing.   Cardiovascular: Negative for palpitations and leg swelling.  Gastrointestinal: Negative for nausea and vomiting.  Genitourinary: Negative for dysuria.  Musculoskeletal: Negative for joint swelling.  Skin: Negative for  rash.  Neurological: Negative for headaches.  Hematological: Does not bruise/bleed easily.  Psychiatric/Behavioral: Negative for dysphoric mood. The patient is not nervous/anxious.        Objective:   Physical Exam Constitutional:  Thin female, no acute distress  HENT:  Nares patent without discharge, mild septal deviation to the left  Oropharynx without exudate, palate and uvula are normal  Eyes:  Perrla, eomi, no scleral icterus  Neck:  No JVD, no TMG  Cardiovascular:  Normal rate, regular rhythm, no rubs or gallops.  No murmurs        Intact distal pulses but diminished.   Pulmonary :  decreased breath sounds, no stridor or respiratory distress   No rales, rhonchi, or wheezing  Abdominal:  Soft, nondistended, bowel sounds present.  No tenderness noted.   Musculoskeletal:  Mild lower extremity edema noted, +varicosities.   Lymph Nodes:  No cervical lymphadenopathy noted  Skin:  No cyanosis noted  Neurologic:  Alert, appropriate, moves all 4 extremities without obvious deficit.         Assessment & Plan:

## 2012-08-17 NOTE — Addendum Note (Signed)
Addended by: Nita Sells on: 08/17/2012 11:30 AM   Modules accepted: Orders

## 2012-08-24 ENCOUNTER — Ambulatory Visit (INDEPENDENT_AMBULATORY_CARE_PROVIDER_SITE_OTHER): Payer: Medicare Other | Admitting: Pulmonary Disease

## 2012-08-24 ENCOUNTER — Encounter: Payer: Self-pay | Admitting: Pulmonary Disease

## 2012-08-24 ENCOUNTER — Ambulatory Visit (HOSPITAL_COMMUNITY)
Admission: RE | Admit: 2012-08-24 | Discharge: 2012-08-24 | Disposition: A | Discharge status: Home / Self Care | Payer: Medicare Other | Source: Ambulatory Visit | Attending: Internal Medicine | Admitting: Internal Medicine

## 2012-08-24 VITALS — BP 150/70 | HR 64 | Temp 97.7°F | Ht 63.5 in | Wt 118.0 lb

## 2012-08-24 DIAGNOSIS — F17200 Nicotine dependence, unspecified, uncomplicated: Secondary | ICD-10-CM | POA: Diagnosis not present

## 2012-08-24 DIAGNOSIS — R0609 Other forms of dyspnea: Secondary | ICD-10-CM | POA: Insufficient documentation

## 2012-08-24 DIAGNOSIS — J449 Chronic obstructive pulmonary disease, unspecified: Secondary | ICD-10-CM

## 2012-08-24 DIAGNOSIS — R0989 Other specified symptoms and signs involving the circulatory and respiratory systems: Secondary | ICD-10-CM | POA: Insufficient documentation

## 2012-08-24 DIAGNOSIS — J988 Other specified respiratory disorders: Secondary | ICD-10-CM | POA: Diagnosis not present

## 2012-08-24 LAB — PULMONARY FUNCTION TEST

## 2012-08-24 MED ORDER — ALBUTEROL SULFATE (5 MG/ML) 0.5% IN NEBU
2.5000 mg | INHALATION_SOLUTION | Freq: Once | RESPIRATORY_TRACT | Status: AC
  Administered 2012-08-24: 2.5 mg via RESPIRATORY_TRACT

## 2012-08-24 NOTE — Progress Notes (Signed)
  Subjective:    Patient ID: Jamie [name], female    DOB: 1922/08/04, 79 y.o.   MRN: [id number]  HPI The patient comes in today for followup after her recent PFTs.  She was found to have severe airflow obstruction, with an FEV1 percent of 48.  She did have a 21% improvement in FEV1 with bronchodilators.  There was no restriction, but she did have air trapping.  DLCO was severely reduced at 29% of predicted.  I have reviewed the PFTs with her in detail, and answered all of her questions.   Review of Systems  Constitutional: Negative for fever and unexpected weight change.  HENT: Negative for ear pain, nosebleeds, congestion, sore throat, rhinorrhea, sneezing, trouble swallowing, dental problem, postnasal drip and sinus pressure.   Eyes: Negative for redness and itching.  Respiratory: Positive for shortness of breath. Negative for cough, chest tightness and wheezing.   Cardiovascular: Negative for palpitations and leg swelling.  Gastrointestinal: Negative for nausea and vomiting.  Genitourinary: Negative for dysuria.  Musculoskeletal: Negative for joint swelling.  Skin: Negative for rash.  Neurological: Negative for headaches.  Hematological: Does not bruise/bleed easily.  Psychiatric/Behavioral: Negative for dysphoric mood. The patient is not nervous/anxious.        Objective:   Physical Exam Thin female in no acute distress Nose without purulent discharge noted Neck without lymphadenopathy or thyromegaly Lower extremities without significant edema, no cyanosis Alert and oriented, moves all 4 extremities.       Assessment & Plan:

## 2012-08-24 NOTE — Patient Instructions (Addendum)
Will start on anoro one inhalation each am everyday.  Please call and give [place] an update in 4 weeks with how things are going.  Can use albuterol inhaler or albuterol nebs as rescue as we discussed Will refer to pulmonary rehab program at [facility]. Continue with oxygen at exertion and sleep for now. Would like to see you back in 3mos, but call if you are having issues.

## 2012-08-24 NOTE — Assessment & Plan Note (Signed)
The patient has severe airflow obstruction by her PFTs, most certainly secondary to emphysema.  She did have a significant bronchodilator response, and therefore I would like to start her on long-acting medications for maintenance.  We'll start with a LABA/LAMA combination, and see how she responds.  If she continues to have recurrent exacerbations, she will need the addition of an inhaled steroid.  I had a long conversation with her and her daughter about the management of emphysema.  This includes oxygen, vaccines, and finally pulmonary rehabilitation.  She is willing to attend the program at the hospital if referred.

## 2012-08-30 DIAGNOSIS — J441 Chronic obstructive pulmonary disease with (acute) exacerbation: Secondary | ICD-10-CM | POA: Diagnosis not present

## 2012-08-30 DIAGNOSIS — I509 Heart failure, unspecified: Secondary | ICD-10-CM | POA: Diagnosis not present

## 2012-08-30 DIAGNOSIS — M159 Polyosteoarthritis, unspecified: Secondary | ICD-10-CM | POA: Diagnosis not present

## 2012-08-30 DIAGNOSIS — J45909 Unspecified asthma, uncomplicated: Secondary | ICD-10-CM | POA: Diagnosis not present

## 2012-08-30 DIAGNOSIS — I5031 Acute diastolic (congestive) heart failure: Secondary | ICD-10-CM | POA: Diagnosis not present

## 2012-08-30 DIAGNOSIS — J209 Acute bronchitis, unspecified: Secondary | ICD-10-CM | POA: Diagnosis not present

## 2012-09-05 ENCOUNTER — Other Ambulatory Visit: Payer: Self-pay | Admitting: Internal Medicine

## 2012-09-05 ENCOUNTER — Telehealth: Payer: Self-pay

## 2012-09-05 MED ORDER — METOPROLOL TARTRATE 50 MG PO TABS: ORAL_TABLET | ORAL | Status: DC

## 2012-09-05 NOTE — Telephone Encounter (Signed)
..   Requested Prescriptions   Signed Prescriptions Disp Refills  . metoprolol (LOPRESSOR) 50 MG tablet 30 tablet 6    Sig: TAKE HALF TABLET BY MOUTH TWICE DAILY    Authorizing Provider: Duke Salvia    Ordering User: Christella Hartigan, Sharis Keeran Judie Petit

## 2012-09-06 DIAGNOSIS — H353290 Exudative age-related macular degeneration, unspecified eye, stage unspecified: Secondary | ICD-10-CM | POA: Diagnosis not present

## 2012-09-06 DIAGNOSIS — H35059 Retinal neovascularization, unspecified, unspecified eye: Secondary | ICD-10-CM | POA: Diagnosis not present

## 2012-09-19 DIAGNOSIS — I5031 Acute diastolic (congestive) heart failure: Secondary | ICD-10-CM | POA: Diagnosis not present

## 2012-09-19 DIAGNOSIS — J209 Acute bronchitis, unspecified: Secondary | ICD-10-CM | POA: Diagnosis not present

## 2012-09-19 DIAGNOSIS — M159 Polyosteoarthritis, unspecified: Secondary | ICD-10-CM | POA: Diagnosis not present

## 2012-09-19 DIAGNOSIS — I509 Heart failure, unspecified: Secondary | ICD-10-CM | POA: Diagnosis not present

## 2012-09-19 DIAGNOSIS — J45909 Unspecified asthma, uncomplicated: Secondary | ICD-10-CM | POA: Diagnosis not present

## 2012-09-19 DIAGNOSIS — J441 Chronic obstructive pulmonary disease with (acute) exacerbation: Secondary | ICD-10-CM | POA: Diagnosis not present

## 2012-09-20 DIAGNOSIS — H353290 Exudative age-related macular degeneration, unspecified eye, stage unspecified: Secondary | ICD-10-CM | POA: Diagnosis not present

## 2012-09-20 DIAGNOSIS — H35059 Retinal neovascularization, unspecified, unspecified eye: Secondary | ICD-10-CM | POA: Diagnosis not present

## 2012-10-18 ENCOUNTER — Other Ambulatory Visit: Payer: Self-pay

## 2012-10-18 DIAGNOSIS — H353290 Exudative age-related macular degeneration, unspecified eye, stage unspecified: Secondary | ICD-10-CM | POA: Diagnosis not present

## 2012-10-18 DIAGNOSIS — H3560 Retinal hemorrhage, unspecified eye: Secondary | ICD-10-CM | POA: Diagnosis not present

## 2012-10-18 DIAGNOSIS — H35359 Cystoid macular degeneration, unspecified eye: Secondary | ICD-10-CM | POA: Diagnosis not present

## 2012-10-18 DIAGNOSIS — H353190 Nonexudative age-related macular degeneration, unspecified eye, stage unspecified: Secondary | ICD-10-CM | POA: Diagnosis not present

## 2012-11-10 DIAGNOSIS — IMO0002 Reserved for concepts with insufficient information to code with codable children: Secondary | ICD-10-CM | POA: Diagnosis not present

## 2012-11-10 DIAGNOSIS — Z23 Encounter for immunization: Secondary | ICD-10-CM | POA: Diagnosis not present

## 2012-11-10 DIAGNOSIS — J449 Chronic obstructive pulmonary disease, unspecified: Secondary | ICD-10-CM | POA: Diagnosis not present

## 2012-11-10 DIAGNOSIS — I519 Heart disease, unspecified: Secondary | ICD-10-CM | POA: Diagnosis not present

## 2012-11-10 DIAGNOSIS — I10 Essential (primary) hypertension: Secondary | ICD-10-CM | POA: Diagnosis not present

## 2012-11-15 ENCOUNTER — Telehealth (HOSPITAL_COMMUNITY): Payer: Self-pay | Admitting: *Deleted

## 2012-11-15 NOTE — Telephone Encounter (Signed)
Jamie [name] was contacted for referral to Pulmonary Rehab.  She declined, stating she exercises at home and is not interested.  [name] RN

## 2012-11-24 ENCOUNTER — Ambulatory Visit: Payer: Medicare Other | Admitting: Pulmonary Disease

## 2012-11-30 DIAGNOSIS — H353290 Exudative age-related macular degeneration, unspecified eye, stage unspecified: Secondary | ICD-10-CM | POA: Diagnosis not present

## 2012-11-30 DIAGNOSIS — H35059 Retinal neovascularization, unspecified, unspecified eye: Secondary | ICD-10-CM | POA: Diagnosis not present

## 2012-12-14 DIAGNOSIS — H903 Sensorineural hearing loss, bilateral: Secondary | ICD-10-CM | POA: Diagnosis not present

## 2013-01-04 DIAGNOSIS — H353290 Exudative age-related macular degeneration, unspecified eye, stage unspecified: Secondary | ICD-10-CM | POA: Diagnosis not present

## 2013-01-04 DIAGNOSIS — H35059 Retinal neovascularization, unspecified, unspecified eye: Secondary | ICD-10-CM | POA: Diagnosis not present

## 2013-01-18 ENCOUNTER — Other Ambulatory Visit: Payer: Self-pay

## 2013-02-06 ENCOUNTER — Encounter: Payer: Self-pay | Admitting: Internal Medicine

## 2013-02-06 ENCOUNTER — Ambulatory Visit (INDEPENDENT_AMBULATORY_CARE_PROVIDER_SITE_OTHER): Payer: Medicare Other | Admitting: Internal Medicine

## 2013-02-06 VITALS — BP 183/87 | HR 88 | Ht 63.5 in | Wt 124.8 lb

## 2013-02-06 DIAGNOSIS — I442 Atrioventricular block, complete: Secondary | ICD-10-CM | POA: Diagnosis not present

## 2013-02-06 DIAGNOSIS — Z950 Presence of cardiac pacemaker: Secondary | ICD-10-CM

## 2013-02-06 DIAGNOSIS — I10 Essential (primary) hypertension: Secondary | ICD-10-CM

## 2013-02-06 LAB — MDC_IDC_ENUM_SESS_TYPE_INCLINIC
Battery Impedance: 417 Ohm
Battery Voltage: 2.79 V
Brady Statistic AP VS Percent: 0 %
Brady Statistic AS VS Percent: 0 %
Date Time Interrogation Session: 20141125143250
Lead Channel Pacing Threshold Amplitude: 1 V
Lead Channel Pacing Threshold Pulse Width: 0.4 ms
Lead Channel Setting Pacing Amplitude: 2.5 V

## 2013-02-06 NOTE — Assessment & Plan Note (Signed)
The patient's device was interrogated.  The information was reviewed. No changes were made in the programming.    

## 2013-02-06 NOTE — Assessment & Plan Note (Signed)
Poorly controlled  She says last BP was 120s  She will take it at home and following up with Dr Waynard Edwards if BP remains eleveated

## 2013-02-06 NOTE — Assessment & Plan Note (Signed)
Stable on pacer

## 2013-02-06 NOTE — Progress Notes (Signed)
Patient Care Team: Ezequiel Kayser, MD as PCP - General (Internal Medicine)   HPI  Jamie [name] is a 79 y.o. female  seen in followup for complete heart block for which she is status post pacemaker implantation which is programmed in the VDD mode. She underwent device generator replacement fall 2010    She says that she has some increasing fatigue. In part she related to the lack of exercise. We were also whether is related to her beta blocker.  About a month ago or so, she was diagnosed with macular degeneration and she describes herself as "a miracle" with fast improvement in frightening visual impairment in her right eye.  She underwent catheterization demonstrating no obstructive coronary disease. (August 2012)     She has DOE with severe airflow obstruction; she has been treated with oxygen with much improvement. .  Echo 2009 demgitationstrated Left ventricular ejection fraction was estimated to be 60 %.  - There was mild mitral annular calcification. There was mild  mitral valvular regurgitation   Past Medical History  Diagnosis Date  . Complete AV block   . Presence of permanent cardiac pacemaker   . Tachycardia   . Hypertension   . Osteopenia   . Osteoarthritis   . COPD (chronic obstructive pulmonary disease)   . Pacemaker   . Shortness of breath   . Pneumonia   . Asthma   . GERD (gastroesophageal reflux disease)   . Family history of anesthesia complication     SISTER HAS DIFFICULTY WAKING  . CHF (congestive heart failure)   . UTI (urinary tract infection)     Past Surgical History  Procedure Laterality Date  . Hernia repair  11/2002  . Eye surgery  10/2004  . Pacemaker placement      [employer]  . Cholecystectomy      Current Outpatient Prescriptions  Medication Sig Dispense Refill  . albuterol (PROAIR HFA) 108 (90 BASE) MCG/ACT inhaler Inhale 2 puffs into the lungs every 6 (six) hours as needed. For shortness of breath/wheezing      . ALPRAZolam  (XANAX) 0.5 MG tablet Take 0.5 mg by mouth at bedtime as needed. For sleep      . Calcium Carbonate (CALTRATE 600) 1500 MG TABS Take 1 tablet by mouth daily.        . cholecalciferol (VITAMIN D) 1000 UNITS tablet Take 1,000 Units by mouth daily.        . Coenzyme Q10 (CO Q 10 PO) Take 1 capsule by mouth daily.      . Cyanocobalamin (VITAMIN B 12 PO) Take 1 tablet by mouth daily.      . furosemide (LASIX) 20 MG tablet Take 1 tablet (20 mg total) by mouth daily.  30 tablet  2  . levalbuterol (XOPENEX) 0.63 MG/3ML nebulizer solution Take 0.63 mg by nebulization every 6 (six) hours as needed for shortness of breath.      . loratadine (CLARITIN) 10 MG tablet Take 10 mg by mouth daily.      . Magnesium 250 MG TABS Take 1 tablet by mouth daily.      . metoprolol (LOPRESSOR) 50 MG tablet TAKE HALF TABLET BY MOUTH TWICE DAILY  30 tablet  6  . Misc Natural Products (OSTEO BI-FLEX ADV JOINT SHIELD PO) Take 2 capsules by mouth daily.        . montelukast (SINGULAIR) 10 MG tablet Take 1 tablet (10 mg total) by mouth at bedtime.  30  tablet  3  . Multiple Vitamins-Minerals (EYE VITAMINS PO) Take by mouth daily.        . Omega-3 Fatty Acids (FISH OIL) 1200 MG CAPS Take 1 capsule by mouth daily.        No current facility-administered medications for this visit.    Allergies  Allergen Reactions  . Hydrocodone-Acetaminophen     REACTION: GI distress    Review of Systems negative except from HPI and PMH  Physical Exam BP 183/87  Pulse 88  Ht 5' 3.5" (1.613 m)  Wt 124 lb 12.8 oz (56.609 kg)  BMI 21.76 kg/m2 Well developed and well nourished in no acute distress HENT normal E scleral and icterus clear Neck Supple JVP flat; carotids brisk and full Clear to ausculation  Regular rate and rhythm, no murmurs gallops or rub Soft with active bowel sounds No clubbing cyanosis none Edema Alert and oriented, grossly normal motor and sensory function Skin Warm and Dry    Assessment and  Plan

## 2013-02-14 DIAGNOSIS — H35059 Retinal neovascularization, unspecified, unspecified eye: Secondary | ICD-10-CM | POA: Diagnosis not present

## 2013-02-14 DIAGNOSIS — H353290 Exudative age-related macular degeneration, unspecified eye, stage unspecified: Secondary | ICD-10-CM | POA: Diagnosis not present

## 2013-03-21 DIAGNOSIS — H353290 Exudative age-related macular degeneration, unspecified eye, stage unspecified: Secondary | ICD-10-CM | POA: Diagnosis not present

## 2013-03-21 DIAGNOSIS — H35059 Retinal neovascularization, unspecified, unspecified eye: Secondary | ICD-10-CM | POA: Diagnosis not present

## 2013-04-19 ENCOUNTER — Other Ambulatory Visit: Payer: Self-pay | Admitting: Internal Medicine

## 2013-04-19 DIAGNOSIS — I10 Essential (primary) hypertension: Secondary | ICD-10-CM | POA: Diagnosis not present

## 2013-04-19 DIAGNOSIS — M899 Disorder of bone, unspecified: Secondary | ICD-10-CM | POA: Diagnosis not present

## 2013-04-19 DIAGNOSIS — R82998 Other abnormal findings in urine: Secondary | ICD-10-CM | POA: Diagnosis not present

## 2013-04-19 DIAGNOSIS — R809 Proteinuria, unspecified: Secondary | ICD-10-CM | POA: Diagnosis not present

## 2013-04-19 DIAGNOSIS — E785 Hyperlipidemia, unspecified: Secondary | ICD-10-CM | POA: Diagnosis not present

## 2013-04-19 DIAGNOSIS — M949 Disorder of cartilage, unspecified: Secondary | ICD-10-CM | POA: Diagnosis not present

## 2013-04-19 DIAGNOSIS — R7301 Impaired fasting glucose: Secondary | ICD-10-CM | POA: Diagnosis not present

## 2013-04-26 DIAGNOSIS — Z0000 Encounter for general adult medical examination without abnormal findings: Secondary | ICD-10-CM | POA: Diagnosis not present

## 2013-04-26 DIAGNOSIS — Z124 Encounter for screening for malignant neoplasm of cervix: Secondary | ICD-10-CM | POA: Diagnosis not present

## 2013-04-26 DIAGNOSIS — J449 Chronic obstructive pulmonary disease, unspecified: Secondary | ICD-10-CM | POA: Diagnosis not present

## 2013-04-26 DIAGNOSIS — I519 Heart disease, unspecified: Secondary | ICD-10-CM | POA: Diagnosis not present

## 2013-04-26 DIAGNOSIS — J45909 Unspecified asthma, uncomplicated: Secondary | ICD-10-CM | POA: Diagnosis not present

## 2013-04-26 DIAGNOSIS — Z23 Encounter for immunization: Secondary | ICD-10-CM | POA: Diagnosis not present

## 2013-04-26 DIAGNOSIS — Z1331 Encounter for screening for depression: Secondary | ICD-10-CM | POA: Diagnosis not present

## 2013-04-26 DIAGNOSIS — R7301 Impaired fasting glucose: Secondary | ICD-10-CM | POA: Diagnosis not present

## 2013-04-26 DIAGNOSIS — N390 Urinary tract infection, site not specified: Secondary | ICD-10-CM | POA: Diagnosis not present

## 2013-04-26 DIAGNOSIS — Z950 Presence of cardiac pacemaker: Secondary | ICD-10-CM | POA: Diagnosis not present

## 2013-04-26 DIAGNOSIS — M810 Age-related osteoporosis without current pathological fracture: Secondary | ICD-10-CM | POA: Diagnosis not present

## 2013-04-27 DIAGNOSIS — Z1212 Encounter for screening for malignant neoplasm of rectum: Secondary | ICD-10-CM | POA: Diagnosis not present

## 2013-05-07 DIAGNOSIS — H353290 Exudative age-related macular degeneration, unspecified eye, stage unspecified: Secondary | ICD-10-CM | POA: Diagnosis not present

## 2013-05-07 DIAGNOSIS — H35059 Retinal neovascularization, unspecified, unspecified eye: Secondary | ICD-10-CM | POA: Diagnosis not present

## 2013-05-10 ENCOUNTER — Ambulatory Visit (INDEPENDENT_AMBULATORY_CARE_PROVIDER_SITE_OTHER): Payer: Medicare Other | Admitting: *Deleted

## 2013-05-10 DIAGNOSIS — I4729 Other ventricular tachycardia: Secondary | ICD-10-CM

## 2013-05-10 DIAGNOSIS — I442 Atrioventricular block, complete: Secondary | ICD-10-CM

## 2013-05-10 DIAGNOSIS — R000 Tachycardia, unspecified: Secondary | ICD-10-CM

## 2013-05-10 DIAGNOSIS — I472 Ventricular tachycardia, unspecified: Secondary | ICD-10-CM

## 2013-05-10 LAB — MDC_IDC_ENUM_SESS_TYPE_REMOTE
Brady Statistic AP VS Percent: 0 %
Brady Statistic AS VS Percent: 0 %
Date Time Interrogation Session: 20150226135759
Lead Channel Impedance Value: 67 Ohm
Lead Channel Pacing Threshold Amplitude: 1 V
Lead Channel Pacing Threshold Pulse Width: 0.4 ms
Lead Channel Sensing Intrinsic Amplitude: 0.7 mV
Lead Channel Setting Sensing Sensitivity: 2.8 mV
MDC IDC MSMT BATTERY IMPEDANCE: 493 Ohm
MDC IDC MSMT BATTERY REMAINING LONGEVITY: 82 mo
MDC IDC MSMT BATTERY VOLTAGE: 2.78 V
MDC IDC MSMT LEADCHNL RV IMPEDANCE VALUE: 482 Ohm
MDC IDC SET LEADCHNL RV PACING AMPLITUDE: 2.5 V
MDC IDC SET LEADCHNL RV PACING PULSEWIDTH: 0.4 ms
MDC IDC STAT BRADY AP VP PERCENT: 0 %
MDC IDC STAT BRADY AS VP PERCENT: 100 %

## 2013-06-04 ENCOUNTER — Encounter: Payer: Self-pay | Admitting: *Deleted

## 2013-06-12 ENCOUNTER — Encounter: Payer: Self-pay | Admitting: Internal Medicine

## 2013-06-12 DIAGNOSIS — C44519 Basal cell carcinoma of skin of other part of trunk: Secondary | ICD-10-CM | POA: Diagnosis not present

## 2013-06-13 DIAGNOSIS — H353290 Exudative age-related macular degeneration, unspecified eye, stage unspecified: Secondary | ICD-10-CM | POA: Diagnosis not present

## 2013-06-13 DIAGNOSIS — H35059 Retinal neovascularization, unspecified, unspecified eye: Secondary | ICD-10-CM | POA: Diagnosis not present

## 2013-07-25 DIAGNOSIS — H353290 Exudative age-related macular degeneration, unspecified eye, stage unspecified: Secondary | ICD-10-CM | POA: Diagnosis not present

## 2013-07-25 DIAGNOSIS — H35059 Retinal neovascularization, unspecified, unspecified eye: Secondary | ICD-10-CM | POA: Diagnosis not present

## 2013-07-26 ENCOUNTER — Other Ambulatory Visit: Payer: Self-pay | Admitting: Internal Medicine

## 2013-08-13 ENCOUNTER — Ambulatory Visit (INDEPENDENT_AMBULATORY_CARE_PROVIDER_SITE_OTHER): Payer: Medicare Other | Admitting: *Deleted

## 2013-08-13 DIAGNOSIS — I442 Atrioventricular block, complete: Secondary | ICD-10-CM

## 2013-08-13 LAB — MDC_IDC_ENUM_SESS_TYPE_REMOTE
Brady Statistic AP VS Percent: 0 %
Brady Statistic AS VS Percent: 0 %
Date Time Interrogation Session: 20150601120839
Lead Channel Impedance Value: 465 Ohm
Lead Channel Impedance Value: 67 Ohm
Lead Channel Pacing Threshold Amplitude: 0.875 V
Lead Channel Pacing Threshold Pulse Width: 0.4 ms
Lead Channel Sensing Intrinsic Amplitude: 0.7 mV
MDC IDC MSMT BATTERY IMPEDANCE: 568 Ohm
MDC IDC MSMT BATTERY REMAINING LONGEVITY: 77 mo
MDC IDC MSMT BATTERY VOLTAGE: 2.78 V
MDC IDC SET LEADCHNL RV PACING AMPLITUDE: 2.5 V
MDC IDC SET LEADCHNL RV PACING PULSEWIDTH: 0.4 ms
MDC IDC SET LEADCHNL RV SENSING SENSITIVITY: 2.8 mV
MDC IDC STAT BRADY AP VP PERCENT: 0 %
MDC IDC STAT BRADY AS VP PERCENT: 100 %

## 2013-08-13 NOTE — Progress Notes (Signed)
Remote pacemaker transmission.   

## 2013-08-29 DIAGNOSIS — H353290 Exudative age-related macular degeneration, unspecified eye, stage unspecified: Secondary | ICD-10-CM | POA: Diagnosis not present

## 2013-08-29 DIAGNOSIS — H35059 Retinal neovascularization, unspecified, unspecified eye: Secondary | ICD-10-CM | POA: Diagnosis not present

## 2013-09-12 ENCOUNTER — Encounter: Payer: Self-pay | Admitting: Internal Medicine

## 2013-10-10 DIAGNOSIS — H35059 Retinal neovascularization, unspecified, unspecified eye: Secondary | ICD-10-CM | POA: Diagnosis not present

## 2013-10-10 DIAGNOSIS — H353290 Exudative age-related macular degeneration, unspecified eye, stage unspecified: Secondary | ICD-10-CM | POA: Diagnosis not present

## 2013-10-24 DIAGNOSIS — I519 Heart disease, unspecified: Secondary | ICD-10-CM | POA: Diagnosis not present

## 2013-10-24 DIAGNOSIS — I10 Essential (primary) hypertension: Secondary | ICD-10-CM | POA: Diagnosis not present

## 2013-10-24 DIAGNOSIS — Z79899 Other long term (current) drug therapy: Secondary | ICD-10-CM | POA: Diagnosis not present

## 2013-10-24 DIAGNOSIS — IMO0002 Reserved for concepts with insufficient information to code with codable children: Secondary | ICD-10-CM | POA: Diagnosis not present

## 2013-10-24 DIAGNOSIS — Z950 Presence of cardiac pacemaker: Secondary | ICD-10-CM | POA: Diagnosis not present

## 2013-10-24 DIAGNOSIS — J449 Chronic obstructive pulmonary disease, unspecified: Secondary | ICD-10-CM | POA: Diagnosis not present

## 2013-10-24 DIAGNOSIS — R809 Proteinuria, unspecified: Secondary | ICD-10-CM | POA: Diagnosis not present

## 2013-11-14 ENCOUNTER — Ambulatory Visit (INDEPENDENT_AMBULATORY_CARE_PROVIDER_SITE_OTHER): Payer: Medicare Other | Admitting: *Deleted

## 2013-11-14 DIAGNOSIS — I442 Atrioventricular block, complete: Secondary | ICD-10-CM | POA: Diagnosis not present

## 2013-11-14 LAB — MDC_IDC_ENUM_SESS_TYPE_REMOTE
Battery Impedance: 619 Ohm
Brady Statistic AP VS Percent: 0 %
Brady Statistic AS VS Percent: 0 %
Date Time Interrogation Session: 20150902130658
Lead Channel Impedance Value: 490 Ohm
Lead Channel Impedance Value: 67 Ohm
Lead Channel Pacing Threshold Amplitude: 1.125 V
Lead Channel Pacing Threshold Pulse Width: 0.4 ms
Lead Channel Sensing Intrinsic Amplitude: 0.7 mV
Lead Channel Setting Sensing Sensitivity: 2.8 mV
MDC IDC MSMT BATTERY REMAINING LONGEVITY: 74 mo
MDC IDC MSMT BATTERY VOLTAGE: 2.78 V
MDC IDC SET LEADCHNL RV PACING AMPLITUDE: 2.5 V
MDC IDC SET LEADCHNL RV PACING PULSEWIDTH: 0.4 ms
MDC IDC STAT BRADY AP VP PERCENT: 0 %
MDC IDC STAT BRADY AS VP PERCENT: 100 %

## 2013-11-14 NOTE — Progress Notes (Signed)
Remote pacemaker transmission.   

## 2013-11-22 ENCOUNTER — Encounter: Payer: Self-pay | Admitting: Cardiology

## 2013-11-25 ENCOUNTER — Encounter (HOSPITAL_COMMUNITY): Payer: Self-pay | Admitting: Emergency Medicine

## 2013-11-25 ENCOUNTER — Emergency Department (HOSPITAL_COMMUNITY): Payer: Medicare Other

## 2013-11-25 ENCOUNTER — Inpatient Hospital Stay (HOSPITAL_COMMUNITY)
Admission: EM | Admit: 2013-11-25 | Discharge: 2013-11-28 | DRG: 947 | Disposition: A | Discharge status: Home / Self Care | Payer: Medicare Other | Attending: Internal Medicine | Admitting: Internal Medicine

## 2013-11-25 DIAGNOSIS — R7989 Other specified abnormal findings of blood chemistry: Secondary | ICD-10-CM | POA: Diagnosis not present

## 2013-11-25 DIAGNOSIS — J439 Emphysema, unspecified: Secondary | ICD-10-CM

## 2013-11-25 DIAGNOSIS — R079 Chest pain, unspecified: Secondary | ICD-10-CM | POA: Diagnosis not present

## 2013-11-25 DIAGNOSIS — Z950 Presence of cardiac pacemaker: Secondary | ICD-10-CM

## 2013-11-25 DIAGNOSIS — G459 Transient cerebral ischemic attack, unspecified: Secondary | ICD-10-CM | POA: Diagnosis present

## 2013-11-25 DIAGNOSIS — I10 Essential (primary) hypertension: Secondary | ICD-10-CM

## 2013-11-25 DIAGNOSIS — M899 Disorder of bone, unspecified: Secondary | ICD-10-CM

## 2013-11-25 DIAGNOSIS — Z87891 Personal history of nicotine dependence: Secondary | ICD-10-CM | POA: Diagnosis not present

## 2013-11-25 DIAGNOSIS — I517 Cardiomegaly: Secondary | ICD-10-CM | POA: Diagnosis not present

## 2013-11-25 DIAGNOSIS — I951 Orthostatic hypotension: Secondary | ICD-10-CM

## 2013-11-25 DIAGNOSIS — Z7982 Long term (current) use of aspirin: Secondary | ICD-10-CM | POA: Diagnosis not present

## 2013-11-25 DIAGNOSIS — Z9981 Dependence on supplemental oxygen: Secondary | ICD-10-CM | POA: Diagnosis not present

## 2013-11-25 DIAGNOSIS — R42 Dizziness and giddiness: Secondary | ICD-10-CM | POA: Diagnosis not present

## 2013-11-25 DIAGNOSIS — J438 Other emphysema: Secondary | ICD-10-CM | POA: Diagnosis present

## 2013-11-25 DIAGNOSIS — M1990 Unspecified osteoarthritis, unspecified site: Secondary | ICD-10-CM

## 2013-11-25 DIAGNOSIS — I5021 Acute systolic (congestive) heart failure: Secondary | ICD-10-CM | POA: Diagnosis present

## 2013-11-25 DIAGNOSIS — I255 Ischemic cardiomyopathy: Secondary | ICD-10-CM

## 2013-11-25 DIAGNOSIS — H538 Other visual disturbances: Secondary | ICD-10-CM | POA: Diagnosis not present

## 2013-11-25 DIAGNOSIS — M949 Disorder of cartilage, unspecified: Secondary | ICD-10-CM

## 2013-11-25 DIAGNOSIS — Z66 Do not resuscitate: Secondary | ICD-10-CM | POA: Diagnosis present

## 2013-11-25 DIAGNOSIS — R404 Transient alteration of awareness: Secondary | ICD-10-CM | POA: Diagnosis not present

## 2013-11-25 DIAGNOSIS — I5032 Chronic diastolic (congestive) heart failure: Secondary | ICD-10-CM

## 2013-11-25 DIAGNOSIS — I4729 Other ventricular tachycardia: Secondary | ICD-10-CM

## 2013-11-25 DIAGNOSIS — I442 Atrioventricular block, complete: Secondary | ICD-10-CM | POA: Diagnosis not present

## 2013-11-25 DIAGNOSIS — R799 Abnormal finding of blood chemistry, unspecified: Principal | ICD-10-CM | POA: Diagnosis present

## 2013-11-25 DIAGNOSIS — Z79899 Other long term (current) drug therapy: Secondary | ICD-10-CM | POA: Diagnosis not present

## 2013-11-25 DIAGNOSIS — I219 Acute myocardial infarction, unspecified: Secondary | ICD-10-CM | POA: Diagnosis not present

## 2013-11-25 DIAGNOSIS — I214 Non-ST elevation (NSTEMI) myocardial infarction: Secondary | ICD-10-CM | POA: Diagnosis not present

## 2013-11-25 DIAGNOSIS — R000 Tachycardia, unspecified: Secondary | ICD-10-CM

## 2013-11-25 DIAGNOSIS — R778 Other specified abnormalities of plasma proteins: Secondary | ICD-10-CM

## 2013-11-25 DIAGNOSIS — R0602 Shortness of breath: Secondary | ICD-10-CM | POA: Diagnosis not present

## 2013-11-25 DIAGNOSIS — I472 Ventricular tachycardia, unspecified: Secondary | ICD-10-CM

## 2013-11-25 DIAGNOSIS — R0989 Other specified symptoms and signs involving the circulatory and respiratory systems: Secondary | ICD-10-CM | POA: Diagnosis not present

## 2013-11-25 DIAGNOSIS — R55 Syncope and collapse: Secondary | ICD-10-CM

## 2013-11-25 DIAGNOSIS — I369 Nonrheumatic tricuspid valve disorder, unspecified: Secondary | ICD-10-CM | POA: Diagnosis not present

## 2013-11-25 DIAGNOSIS — J40 Bronchitis, not specified as acute or chronic: Secondary | ICD-10-CM | POA: Diagnosis not present

## 2013-11-25 LAB — BASIC METABOLIC PANEL
ANION GAP: 13 (ref 5–15)
BUN: 17 mg/dL (ref 6–23)
CHLORIDE: 97 meq/L (ref 96–112)
CO2: 28 meq/L (ref 19–32)
CREATININE: 0.77 mg/dL (ref 0.50–1.10)
Calcium: 9.5 mg/dL (ref 8.4–10.5)
GFR calc Af Amer: 84 mL/min — ABNORMAL LOW (ref >90)
GFR calc non Af Amer: 72 mL/min — ABNORMAL LOW (ref >90)
Glucose, Bld: 96 mg/dL (ref 70–99)
POTASSIUM: 4.5 meq/L (ref 3.7–5.3)
Sodium: 138 mEq/L (ref 137–147)

## 2013-11-25 LAB — CBC
HEMATOCRIT: 42.6 % (ref 36.0–46.0)
HEMOGLOBIN: 14.1 g/dL (ref 12.0–15.0)
MCH: 31.8 pg (ref 26.0–34.0)
MCHC: 33.1 g/dL (ref 30.0–36.0)
MCV: 96.2 fL (ref 78.0–100.0)
Platelets: 189 10*3/uL (ref 150–400)
RBC: 4.43 MIL/uL (ref 3.87–5.11)
RDW: 13 % (ref 11.5–15.5)
WBC: 6.9 10*3/uL (ref 4.0–10.5)

## 2013-11-25 LAB — URINE MICROSCOPIC-ADD ON

## 2013-11-25 LAB — URINALYSIS, ROUTINE W REFLEX MICROSCOPIC
BILIRUBIN URINE: NEGATIVE
Glucose, UA: NEGATIVE mg/dL
Ketones, ur: NEGATIVE mg/dL
Nitrite: NEGATIVE
Protein, ur: NEGATIVE mg/dL
Specific Gravity, Urine: 1.007 (ref 1.005–1.030)
UROBILINOGEN UA: 0.2 mg/dL (ref 0.0–1.0)
pH: 6 (ref 5.0–8.0)

## 2013-11-25 LAB — I-STAT TROPONIN, ED
Troponin i, poc: 0.01 ng/mL (ref 0.00–0.08)
Troponin i, poc: 0.23 ng/mL (ref 0.00–0.08)

## 2013-11-25 LAB — CBG MONITORING, ED: GLUCOSE-CAPILLARY: 85 mg/dL (ref 70–99)

## 2013-11-25 MED ORDER — ASPIRIN EC 325 MG PO TBEC
325.0000 mg | DELAYED_RELEASE_TABLET | Freq: Once | ORAL | Status: AC
  Administered 2013-11-26: 325 mg via ORAL
  Filled 2013-11-25: qty 1

## 2013-11-25 NOTE — ED Notes (Signed)
[employer] rep paged for pacemaker interrogation

## 2013-11-25 NOTE — ED Notes (Addendum)
Per ems pt is from home. Pt reports she was sitting at her computer when her vision when blurry and she became dizzy, after 10 minutes blurry vision resolved, dizziness still intermittent. Hx of anxiety. Denies pain. Pt reports she "feels jittery". Pt has pacemaker. Stroke screen negative, pt denies weakness.

## 2013-11-25 NOTE — ED Notes (Signed)
Jamie [name], rep from [place] [phone]  Change batteries and put washcloth in btw machine and pacemaker

## 2013-11-25 NOTE — ED Notes (Signed)
Bed: MB34 Expected date: 11/25/13 Expected time: 6:17 PM Means of arrival: Ambulance Comments: dizziness

## 2013-11-25 NOTE — ED Provider Notes (Signed)
CSN: [id number]     Arrival date & time 11/25/13  1822 History   First MD Initiated Contact with Patient 11/25/13 1836     Chief Complaint  Patient presents with  . Dizziness     (Consider location/radiation/quality/duration/timing/severity/associated sxs/prior Treatment) Patient is a 80 y.o. female presenting with general illness.  Illness Quality:  Blurred vision with swirling numbers Severity:  Moderate Onset quality:  Gradual Duration: 5 minutes. Timing:  Constant Progression:  Unchanged Chronicity:  New Context:  Spontaneous, no ho same Relieved by:  Nothing Worsened by:  Nothing Associated symptoms: no abdominal pain, no chest pain, no congestion, no cough, no diarrhea, no fever, no nausea, no rash, no rhinorrhea, no shortness of breath, no sore throat and no vomiting     Past Medical History  Diagnosis Date  . Complete AV block   . Presence of permanent cardiac pacemaker   . Tachycardia   . Hypertension   . Osteopenia   . Osteoarthritis   . COPD (chronic obstructive pulmonary disease)   . Pacemaker   . Shortness of breath   . Pneumonia   . Asthma   . GERD (gastroesophageal reflux disease)   . Family history of anesthesia complication     SISTER HAS DIFFICULTY WAKING  . CHF (congestive heart failure)   . UTI (urinary tract infection)    Past Surgical History  Procedure Laterality Date  . Hernia repair  11/2002  . Eye surgery  10/2004  . Pacemaker placement      [employer]tronic  . Cholecystectomy     Family History  Problem Relation Age of Onset  . Heart disease Sister   . Breast cancer Sister    History  Substance Use Topics  . Smoking status: Former Smoker -- 1.00 packs/day for 35 years    Types: Cigarettes    Quit date: 03/16/1987  . Smokeless tobacco: Never Used  . Alcohol Use: No   OB History   Grav Para Term Preterm Abortions TAB SAB Ect Mult Living                 Review of Systems  Constitutional: Negative for fever and chills.  HENT:  Negative for congestion, rhinorrhea and sore throat.   Eyes: Negative for photophobia and visual disturbance.  Respiratory: Negative for cough and shortness of breath.   Cardiovascular: Negative for chest pain and leg swelling.  Gastrointestinal: Negative for nausea, vomiting, abdominal pain, diarrhea and constipation.  Endocrine: Negative for polyphagia and polyuria.  Genitourinary: Negative for dysuria, flank pain, vaginal bleeding, vaginal discharge and enuresis.  Musculoskeletal: Negative for back pain and gait problem.  Skin: Negative for color change and rash.  Neurological: Negative for dizziness, syncope, light-headedness and numbness.  Hematological: Negative for adenopathy. Does not bruise/bleed easily.  All other systems reviewed and are negative.     Allergies  Hydrocodone-acetaminophen  Home Medications   Prior to Admission medications   Medication Sig Start Date End Date Taking? Authorizing Provider  albuterol (PROAIR HFA) 108 (90 BASE) MCG/ACT inhaler Inhale 2 puffs into the lungs every 6 (six) hours as needed. For shortness of breath/wheezing   Yes Historical Provider, MD  ALPRAZolam Duanne Moron) 0.5 MG tablet Take 0.5 mg by mouth at bedtime as needed. For sleep   Yes Historical Provider, MD  aspirin EC 81 MG tablet Take 81 mg by mouth 3 (three) times a week.   Yes Historical Provider, MD  Calcium Carbonate (CALTRATE 600) 1500 MG TABS Take 600 mg by mouth  daily.    Yes Historical Provider, MD  cholecalciferol (VITAMIN D) 1000 UNITS tablet Take 1,000 Units by mouth daily.     Yes Historical Provider, MD  Coenzyme Q10 (CO Q 10 PO) Take 1 capsule by mouth daily.   Yes Historical Provider, MD  Cyanocobalamin (VITAMIN B 12 PO) Take 1 tablet by mouth daily.   Yes Historical Provider, MD  furosemide (LASIX) 20 MG tablet Take 1 tablet (20 mg total) by mouth daily. 07/25/12  Yes Ripudeep Krystal Eaton, MD  levalbuterol (XOPENEX) 0.63 MG/3ML nebulizer solution Take 0.63 mg by nebulization  every 6 (six) hours as needed for shortness of breath. 07/25/12  Yes Ripudeep Krystal Eaton, MD  loratadine (CLARITIN) 10 MG tablet Take 10 mg by mouth daily.   Yes Historical Provider, MD  Magnesium 250 MG TABS Take 250 mg by mouth daily.    Yes Historical Provider, MD  metoprolol (LOPRESSOR) 50 MG tablet Take 25 mg by mouth 2 (two) times daily.   Yes Historical Provider, MD  Misc Natural Products (OSTEO BI-FLEX ADV JOINT SHIELD PO) Take 2 capsules by mouth daily.     Yes Historical Provider, MD  Multiple Vitamins-Minerals (EYE VITAMINS PO) Take by mouth daily.     Yes Historical Provider, MD  Omega-3 Fatty Acids (FISH OIL) 1200 MG CAPS Take 1,200 mg by mouth daily.    Yes Historical Provider, MD  Polyethyl Glycol-Propyl Glycol (SYSTANE) 0.4-0.3 % SOLN Place 1 drop into both eyes 2 (two) times daily as needed (dry eyes).   Yes Historical Provider, MD  potassium chloride (K-DUR) 10 MEQ tablet Take 10 mEq by mouth 2 (two) times a week. Tuesday and friday   Yes Historical Provider, MD  lisinopril (PRINIVIL,ZESTRIL) 5 MG tablet Take 1 tablet (5 mg total) by mouth daily. 11/28/13   Charlynne Cousins, MD  nitroGLYCERIN (NITROSTAT) 0.4 MG SL tablet Place 1 tablet (0.4 mg total) under the tongue every 5 (five) minutes as needed for chest pain. 11/28/13   Charlynne Cousins, MD  rosuvastatin (CRESTOR) 10 MG tablet Take 1 tablet (10 mg total) by mouth daily. 11/28/13   Charlynne Cousins, MD   BP 104/57  Pulse 65  Temp(Src) 98 F (36.7 C) (Oral)  Resp 18  Ht 5' 3.39" (1.61 m)  Wt 119 lb 11.2 oz (54.296 kg)  BMI 20.95 kg/m2  SpO2 98% Physical Exam  Vitals reviewed. Constitutional: She is oriented to person, place, and time. She appears well-developed and well-nourished.  HENT:  Head: Normocephalic and atraumatic.  Right Ear: External ear normal.  Left Ear: External ear normal.  Eyes: Conjunctivae and EOM are normal. Pupils are equal, round, and reactive to light.  Neck: Normal range of motion. Neck  supple.  Cardiovascular: Normal rate, regular rhythm, normal heart sounds and intact distal pulses.   Pulmonary/Chest: Effort normal and breath sounds normal.  Abdominal: Soft. Bowel sounds are normal. There is no tenderness.  Musculoskeletal: Normal range of motion.  Neurological: She is alert and oriented to person, place, and time. She has normal strength and normal reflexes. No cranial nerve deficit or sensory deficit. Gait normal. GCS eye subscore is 4. GCS verbal subscore is 5. GCS motor subscore is 6.  Skin: Skin is warm and dry.    ED Course  Procedures (including critical care time) Labs Review Labs Reviewed  BASIC METABOLIC PANEL - Abnormal; Notable for the following:    GFR calc non Af Amer 72 (*)    GFR calc Af Wyvonnia Lora  84 (*)    All other components within normal limits  URINALYSIS, ROUTINE W REFLEX MICROSCOPIC - Abnormal; Notable for the following:    Hgb urine dipstick MODERATE (*)    Leukocytes, UA SMALL (*)    All other components within normal limits  COMPREHENSIVE METABOLIC PANEL - Abnormal; Notable for the following:    GFR calc non Af Amer 75 (*)    GFR calc Af Amer 87 (*)    All other components within normal limits  APTT - Abnormal; Notable for the following:    aPTT 62 (*)    All other components within normal limits  HEMOGLOBIN A1C - Abnormal; Notable for the following:    Hemoglobin A1C 5.9 (*)    Mean Plasma Glucose 123 (*)    All other components within normal limits  PROTIME-INR - Abnormal; Notable for the following:    Prothrombin Time 15.3 (*)    All other components within normal limits  CK TOTAL AND CKMB - Abnormal; Notable for the following:    CK, MB 4.1 (*)    All other components within normal limits  TROPONIN I - Abnormal; Notable for the following:    Troponin I 0.46 (*)    All other components within normal limits  HEPARIN LEVEL (UNFRACTIONATED) - Abnormal; Notable for the following:    Heparin Unfractionated 0.15 (*)    All other  components within normal limits  HEPARIN LEVEL (UNFRACTIONATED) - Abnormal; Notable for the following:    Heparin Unfractionated 0.10 (*)    All other components within normal limits  LIPID PANEL - Abnormal; Notable for the following:    LDL Cholesterol 102 (*)    All other components within normal limits  HEPARIN LEVEL (UNFRACTIONATED) - Abnormal; Notable for the following:    Heparin Unfractionated 0.11 (*)    All other components within normal limits  I-STAT TROPOININ, ED - Abnormal; Notable for the following:    Troponin i, poc 0.23 (*)    All other components within normal limits  I-STAT TROPOININ, ED - Abnormal; Notable for the following:    Troponin i, poc 0.37 (*)    All other components within normal limits  CBC  URINE MICROSCOPIC-ADD ON  CBC  TSH  CBC  HEPARIN LEVEL (UNFRACTIONATED)  CBC  CBG MONITORING, ED  I-STAT TROPOININ, ED  I-STAT TROPOININ, ED    Imaging Review No results found.   EKG Interpretation   Date/Time:  Sunday November 25 2013 18:36:35 EDT Ventricular Rate:  64 PR Interval:  143 QRS Duration: 139 QT Interval:  461 QTC Calculation: 476 R Axis:   -98 Text Interpretation:  Atrial-sensed ventricular-paced rhythm No further  analysis attempted due to paced rhythm ED PHYSICIAN INTERPRETATION  AVAILABLE IN [facility] [place] Confirmed by TEST, Record ([id number]) on  11/27/2013 7:01:51 AM      MDM   Final diagnoses:  Non-STEMI  possibly type II    80 y.o. female with pertinent PMH of CAD, CHF, pacemaker placement, COPD presents with 5 minutes of spontaneous, now resolved, vision problems without frank chest pain or systemic symptoms.  She denies near sycope, dizziness, or other complaint.  On arrival pt is asymptomatic and has a completely benign exam including no focal neuro deficits.  ECG unchanged.  Checked pacer which did not record abnormality. Labs returned initially unremarkable.  Delta troponin positive, consulted for admission.    1.  Non-STEMI  possibly type II   2. Cardiac pacemaker in situ   3. Pulmonary  emphysema, unspecified emphysema type   4. Dizziness   5. Elevated troponin   6. Unspecified essential hypertension   7. Atrioventricular block, complete   8. Paroxysmal ventricular tachycardia   9. Near syncope   10. Chronic diastolic heart failure   11. HYPERTENSION, UNSPECIFIED   12. Pacemaker-Medtronic   13. AV BLOCK, COMPLETE   14. VENTRICULAR TACHYCARDIA   15. OSTEOARTHRITIS   16. OSTEOPENIA   17. TACHYCARDIA   18. Orthostatic hypotension   19. Elevated brain natriuretic peptide (BNP) level   20. Cardiomyopathy, ischemic         Debby Freiberg, MD 12/02/13 1459

## 2013-11-25 NOTE — ED Notes (Signed)
Spoke to rep from [place] and was informed that there has been no change in device since prior reading on 11/14/13.  Dr. Colin Rhein notified.

## 2013-11-25 NOTE — ED Notes (Signed)
md at bedside  Pt alert and oriented x4. Respirations even and unlabored, bilateral symmetrical rise and fall of chest. Skin warm and dry. In no acute distress. Denies needs.   

## 2013-11-26 ENCOUNTER — Encounter (HOSPITAL_COMMUNITY): Payer: Self-pay | Admitting: *Deleted

## 2013-11-26 ENCOUNTER — Inpatient Hospital Stay (HOSPITAL_COMMUNITY): Payer: Medicare Other

## 2013-11-26 DIAGNOSIS — I472 Ventricular tachycardia: Secondary | ICD-10-CM | POA: Diagnosis not present

## 2013-11-26 DIAGNOSIS — J438 Other emphysema: Secondary | ICD-10-CM | POA: Diagnosis not present

## 2013-11-26 DIAGNOSIS — I10 Essential (primary) hypertension: Secondary | ICD-10-CM

## 2013-11-26 DIAGNOSIS — Z950 Presence of cardiac pacemaker: Secondary | ICD-10-CM

## 2013-11-26 DIAGNOSIS — Z66 Do not resuscitate: Secondary | ICD-10-CM | POA: Diagnosis present

## 2013-11-26 DIAGNOSIS — I4729 Other ventricular tachycardia: Secondary | ICD-10-CM | POA: Diagnosis not present

## 2013-11-26 DIAGNOSIS — R7989 Other specified abnormal findings of blood chemistry: Secondary | ICD-10-CM

## 2013-11-26 DIAGNOSIS — R55 Syncope and collapse: Secondary | ICD-10-CM

## 2013-11-26 DIAGNOSIS — R42 Dizziness and giddiness: Secondary | ICD-10-CM | POA: Diagnosis present

## 2013-11-26 DIAGNOSIS — I442 Atrioventricular block, complete: Secondary | ICD-10-CM

## 2013-11-26 DIAGNOSIS — R799 Abnormal finding of blood chemistry, unspecified: Secondary | ICD-10-CM | POA: Diagnosis not present

## 2013-11-26 DIAGNOSIS — Z7982 Long term (current) use of aspirin: Secondary | ICD-10-CM | POA: Diagnosis not present

## 2013-11-26 DIAGNOSIS — I5021 Acute systolic (congestive) heart failure: Secondary | ICD-10-CM | POA: Diagnosis present

## 2013-11-26 DIAGNOSIS — R079 Chest pain, unspecified: Secondary | ICD-10-CM

## 2013-11-26 DIAGNOSIS — I214 Non-ST elevation (NSTEMI) myocardial infarction: Secondary | ICD-10-CM

## 2013-11-26 DIAGNOSIS — I219 Acute myocardial infarction, unspecified: Secondary | ICD-10-CM | POA: Diagnosis not present

## 2013-11-26 DIAGNOSIS — I369 Nonrheumatic tricuspid valve disorder, unspecified: Secondary | ICD-10-CM | POA: Diagnosis not present

## 2013-11-26 DIAGNOSIS — R778 Other specified abnormalities of plasma proteins: Secondary | ICD-10-CM | POA: Diagnosis present

## 2013-11-26 DIAGNOSIS — Z9981 Dependence on supplemental oxygen: Secondary | ICD-10-CM | POA: Diagnosis not present

## 2013-11-26 DIAGNOSIS — Z79899 Other long term (current) drug therapy: Secondary | ICD-10-CM | POA: Diagnosis not present

## 2013-11-26 DIAGNOSIS — G459 Transient cerebral ischemic attack, unspecified: Secondary | ICD-10-CM | POA: Diagnosis present

## 2013-11-26 DIAGNOSIS — R0989 Other specified symptoms and signs involving the circulatory and respiratory systems: Secondary | ICD-10-CM

## 2013-11-26 DIAGNOSIS — Z87891 Personal history of nicotine dependence: Secondary | ICD-10-CM | POA: Diagnosis not present

## 2013-11-26 LAB — COMPREHENSIVE METABOLIC PANEL
ALK PHOS: 77 U/L (ref 39–117)
ALT: 14 U/L (ref 0–35)
ANION GAP: 15 (ref 5–15)
AST: 23 U/L (ref 0–37)
Albumin: 3.5 g/dL (ref 3.5–5.2)
BILIRUBIN TOTAL: 0.8 mg/dL (ref 0.3–1.2)
BUN: 10 mg/dL (ref 6–23)
CHLORIDE: 100 meq/L (ref 96–112)
CO2: 27 meq/L (ref 19–32)
Calcium: 9.1 mg/dL (ref 8.4–10.5)
Creatinine, Ser: 0.68 mg/dL (ref 0.50–1.10)
GFR calc non Af Amer: 75 mL/min — ABNORMAL LOW (ref >90)
GFR, EST AFRICAN AMERICAN: 87 mL/min — AB (ref >90)
GLUCOSE: 99 mg/dL (ref 70–99)
POTASSIUM: 4.4 meq/L (ref 3.7–5.3)
SODIUM: 142 meq/L (ref 137–147)
Total Protein: 6.2 g/dL (ref 6.0–8.3)

## 2013-11-26 LAB — I-STAT TROPONIN, ED: TROPONIN I, POC: 0.37 ng/mL — AB (ref 0.00–0.08)

## 2013-11-26 LAB — CBC
HCT: 40.9 % (ref 36.0–46.0)
HEMOGLOBIN: 13.7 g/dL (ref 12.0–15.0)
MCH: 31.6 pg (ref 26.0–34.0)
MCHC: 33.5 g/dL (ref 30.0–36.0)
MCV: 94.2 fL (ref 78.0–100.0)
Platelets: 186 10*3/uL (ref 150–400)
RBC: 4.34 MIL/uL (ref 3.87–5.11)
RDW: 13 % (ref 11.5–15.5)
WBC: 6.1 10*3/uL (ref 4.0–10.5)

## 2013-11-26 LAB — TROPONIN I: Troponin I: 0.46 ng/mL (ref <0.30)

## 2013-11-26 LAB — CK TOTAL AND CKMB (NOT AT ARMC)
CK, MB: 4.1 ng/mL — AB (ref 0.3–4.0)
RELATIVE INDEX: INVALID (ref 0.0–2.5)
Total CK: 84 U/L (ref 7–177)

## 2013-11-26 LAB — HEPARIN LEVEL (UNFRACTIONATED)
HEPARIN UNFRACTIONATED: 0.15 [IU]/mL — AB (ref 0.30–0.70)
HEPARIN UNFRACTIONATED: 0.1 [IU]/mL — AB (ref 0.30–0.70)

## 2013-11-26 LAB — APTT: aPTT: 62 seconds — ABNORMAL HIGH (ref 24–37)

## 2013-11-26 LAB — PROTIME-INR
INR: 1.21 (ref 0.00–1.49)
Prothrombin Time: 15.3 seconds — ABNORMAL HIGH (ref 11.6–15.2)

## 2013-11-26 LAB — HEMOGLOBIN A1C
Hgb A1c MFr Bld: 5.9 % — ABNORMAL HIGH (ref <5.7)
Mean Plasma Glucose: 123 mg/dL — ABNORMAL HIGH (ref <117)

## 2013-11-26 LAB — TSH: TSH: 2.08 u[IU]/mL (ref 0.350–4.500)

## 2013-11-26 MED ORDER — LORATADINE 10 MG PO TABS
10.0000 mg | ORAL_TABLET | Freq: Every day | ORAL | Status: DC
  Administered 2013-11-26 – 2013-11-28 (×3): 10 mg via ORAL
  Filled 2013-11-26 (×3): qty 1

## 2013-11-26 MED ORDER — FUROSEMIDE 20 MG PO TABS
20.0000 mg | ORAL_TABLET | Freq: Every day | ORAL | Status: DC
  Administered 2013-11-26 – 2013-11-28 (×3): 20 mg via ORAL
  Filled 2013-11-26 (×3): qty 1

## 2013-11-26 MED ORDER — ALBUTEROL SULFATE HFA 108 (90 BASE) MCG/ACT IN AERS: 2.0000 | INHALATION_SPRAY | Freq: Four times a day (QID) | RESPIRATORY_TRACT | Status: DC | PRN

## 2013-11-26 MED ORDER — TECHNETIUM TC 99M SESTAMIBI GENERIC - CARDIOLITE
30.0000 | Freq: Once | INTRAVENOUS | Status: AC | PRN
  Administered 2013-11-26: 30 via INTRAVENOUS

## 2013-11-26 MED ORDER — TECHNETIUM TC 99M SESTAMIBI GENERIC - CARDIOLITE
10.0000 | Freq: Once | INTRAVENOUS | Status: AC | PRN
  Administered 2013-11-26: 10 via INTRAVENOUS

## 2013-11-26 MED ORDER — SODIUM CHLORIDE 0.9 % IV SOLN: INTRAVENOUS | Status: DC

## 2013-11-26 MED ORDER — REGADENOSON 0.4 MG/5ML IV SOLN
INTRAVENOUS | Status: AC
  Administered 2013-11-26: 0.4 mg via INTRAVENOUS
  Filled 2013-11-26: qty 5

## 2013-11-26 MED ORDER — HEPARIN (PORCINE) IN NACL 100-0.45 UNIT/ML-% IJ SOLN
1000.0000 [IU]/h | INTRAMUSCULAR | Status: DC
  Administered 2013-11-26: 650 [IU]/h via INTRAVENOUS
  Administered 2013-11-27: 1000 [IU]/h via INTRAVENOUS
  Filled 2013-11-26 (×3): qty 250

## 2013-11-26 MED ORDER — ONDANSETRON HCL 4 MG PO TABS: 4.0000 mg | ORAL_TABLET | Freq: Four times a day (QID) | ORAL | Status: DC | PRN

## 2013-11-26 MED ORDER — ALBUTEROL SULFATE (2.5 MG/3ML) 0.083% IN NEBU
3.0000 mL | INHALATION_SOLUTION | Freq: Four times a day (QID) | RESPIRATORY_TRACT | Status: DC | PRN
  Administered 2013-11-26: 3 mL via RESPIRATORY_TRACT
  Filled 2013-11-26: qty 3

## 2013-11-26 MED ORDER — REGADENOSON 0.4 MG/5ML IV SOLN
0.4000 mg | Freq: Once | INTRAVENOUS | Status: AC
  Administered 2013-11-26: 0.4 mg via INTRAVENOUS
  Filled 2013-11-26: qty 5

## 2013-11-26 MED ORDER — ACETAMINOPHEN 650 MG RE SUPP: 650.0000 mg | Freq: Four times a day (QID) | RECTAL | Status: DC | PRN

## 2013-11-26 MED ORDER — ALPRAZOLAM 0.5 MG PO TABS: 0.5000 mg | ORAL_TABLET | Freq: Every evening | ORAL | Status: DC | PRN

## 2013-11-26 MED ORDER — ONDANSETRON HCL 4 MG/2ML IJ SOLN
4.0000 mg | Freq: Four times a day (QID) | INTRAMUSCULAR | Status: DC | PRN
  Administered 2013-11-28: 4 mg via INTRAVENOUS
  Filled 2013-11-26: qty 2

## 2013-11-26 MED ORDER — SIMVASTATIN 20 MG PO TABS
20.0000 mg | ORAL_TABLET | Freq: Every day | ORAL | Status: DC
  Filled 2013-11-26 (×2): qty 1

## 2013-11-26 MED ORDER — ASPIRIN EC 325 MG PO TBEC
325.0000 mg | DELAYED_RELEASE_TABLET | Freq: Every day | ORAL | Status: DC
  Administered 2013-11-26 – 2013-11-27 (×2): 325 mg via ORAL
  Filled 2013-11-26 (×2): qty 1

## 2013-11-26 MED ORDER — ALUM & MAG HYDROXIDE-SIMETH 200-200-20 MG/5ML PO SUSP
30.0000 mL | Freq: Four times a day (QID) | ORAL | Status: DC | PRN
  Filled 2013-11-26: qty 30

## 2013-11-26 MED ORDER — SODIUM CHLORIDE 0.9 % IJ SOLN
3.0000 mL | Freq: Two times a day (BID) | INTRAMUSCULAR | Status: DC
  Administered 2013-11-27 – 2013-11-28 (×2): 3 mL via INTRAVENOUS

## 2013-11-26 MED ORDER — SODIUM CHLORIDE 0.9 % IJ SOLN
80.0000 mg | INTRAVENOUS | Status: AC
  Administered 2013-11-26: 40 mg via INTRAVENOUS

## 2013-11-26 MED ORDER — METOPROLOL TARTRATE 25 MG PO TABS
25.0000 mg | ORAL_TABLET | Freq: Two times a day (BID) | ORAL | Status: DC
  Administered 2013-11-26 – 2013-11-28 (×5): 25 mg via ORAL
  Filled 2013-11-26 (×6): qty 1

## 2013-11-26 MED ORDER — ACETAMINOPHEN 325 MG PO TABS: 650.0000 mg | ORAL_TABLET | Freq: Four times a day (QID) | ORAL | Status: DC | PRN

## 2013-11-26 MED ORDER — LISINOPRIL 5 MG PO TABS
5.0000 mg | ORAL_TABLET | Freq: Every day | ORAL | Status: DC
  Administered 2013-11-28: 5 mg via ORAL
  Filled 2013-11-26 (×3): qty 1

## 2013-11-26 MED ORDER — HEPARIN BOLUS VIA INFUSION
3000.0000 [IU] | Freq: Once | INTRAVENOUS | Status: AC
  Administered 2013-11-26: 3000 [IU] via INTRAVENOUS
  Filled 2013-11-26: qty 3000

## 2013-11-26 MED ORDER — INFLUENZA VAC SPLIT QUAD 0.5 ML IM SUSY
0.5000 mL | PREFILLED_SYRINGE | INTRAMUSCULAR | Status: AC
  Administered 2013-11-27: 0.5 mL via INTRAMUSCULAR
  Filled 2013-11-26: qty 0.5

## 2013-11-26 NOTE — Progress Notes (Signed)
VASCULAR LAB PRELIMINARY  PRELIMINARY  PRELIMINARY  PRELIMINARY  Catotid Dopplers completed.    Preliminary report:  1-39% ICA stenosis. Vertebral artery flow is antegrade.   Wonda Goodgame, RVT 11/26/2013, 2:49 PM

## 2013-11-26 NOTE — Progress Notes (Signed)
Utilization review completed.  

## 2013-11-26 NOTE — Progress Notes (Signed)
80 y.o. female w/ PMHx significant for complete AV block s/p pacer, HTN, HFpEF, COPD who presented to [facility] on 11/26/2013 with complaints of an episode of dizziness. Pacemaker interrogation without abnormalities. + elevated troponin POC.  Previous hx of cath 20102 with non obstructive disease with elevated troponins of 0.49.  Cath 2012: ANGIOGRAPHIC DATA: The left coronary artery arises and distributes  normally. The left main coronary artery is normal.  The left anterior descending artery has 20% narrowing in the midvessel.  Otherwise, it has only minor irregularities.  The left circumflex coronary artery has only minor irregularities  throughout.  The right coronary artery is a dominant vessel. It has 30% segmental  disease in the proximal to mid vessel. No obstructive lesions were  noted.  Left ventricular angiography performed in the RAO view demonstrates  normal left ventricular size with anterior wall dyssynergy due to her  paced rhythm. Overall ejection fraction is 50-55%.     Subjective: No complaints this am, feels well.  Objective: Vital signs in last 24 hours: Temp:  [97.9 F (36.6 C)-98.2 F (36.8 C)] 98.2 F (36.8 C) (09/14 0400) Pulse Rate:  [61-81] 76 (09/14 0400) Resp:  [16-25] 18 (09/14 0400) BP: (119-180)/(58-93) 139/65 mmHg (09/14 0400) SpO2:  [91 %-99 %] 98 % (09/14 0400) Weight:  [124 lb (56.246 kg)-132 lb 6.4 oz (60.056 kg)] 132 lb 6.4 oz (60.056 kg) (09/14 0223) Weight change:  Last BM Date: 11/25/13 Intake/Output from previous day:   Intake/Output this shift:    PE: General:Pleasant affect, NAD Skin:Warm and dry, brisk capillary refill HEENT:normocephalic, sclera clear, mucus membranes moist Heart:S1S2 RRR without murmur, gallup, rub or click Lungs:clear without rales, rhonchi, or wheezes AST:MHDQ, non tender, + BS, do not palpate liver spleen or masses Ext:no lower ext edema, 2+ pedal pulses, 2+ radial pulses Neuro:alert  and oriented, MAE, follows commands, + facial symmetry  tele:  A sensing v pacing occ PVC Lab Results: troponin POC  0.23, 0.37- Troponin I to be drawn  Recent Labs  11/25/13 1852  WBC 6.9  HGB 14.1  HCT 42.6  PLT 189   BMET  Recent Labs  11/25/13 1852  NA 138  K 4.5  CL 97  CO2 28  GLUCOSE 96  BUN 17  CREATININE 0.77  CALCIUM 9.5   No results found for this basename: TROPONINI, CK, MB,  in the last 72 hours  Lab Results  Component Value Date   CHOL 156 11/05/2010   HDL 55 11/05/2010   LDLCALC 91 11/05/2010   TRIG 50 11/05/2010   CHOLHDL 2.8 11/05/2010   No results found for this basename: HGBA1C     Lab Results  Component Value Date   TSH 1.039 08/16/2011      Studies/Results: Dg Chest 2 View  11/25/2013   CLINICAL DATA:  Dizziness. Pacemaker. COPD. Hypertension. History of smoking.  EXAM: CHEST  2 VIEW  COMPARISON:  07/23/2012  FINDINGS: Patient's left-sided transvenous pacemaker with leads overlying the right atrium and right ventricle. The heart is mildly enlarged. There is perihilar peribronchial thickening. There are no focal consolidations or pleural effusions. No pulmonary edema. Surgical clips are noted in the right upper quadrant of the abdomen.  IMPRESSION: 1. Cardiomegaly without pulmonary edema. 2. Bronchitic change. 3.  No focal acute pulmonary abnormality.   Electronically Signed   By: Shon Hale M.D.   On: 11/25/2013 19:58   Ct Head Wo Contrast  11/25/2013   CLINICAL  DATA:  Blurry vision and dizziness.  EXAM: CT HEAD WITHOUT CONTRAST  TECHNIQUE: Contiguous axial images were obtained from the base of the skull through the vertex without intravenous contrast.  COMPARISON:  08/17/2011  FINDINGS: Mild global atrophy appropriate to age noted. No acute cortical infarct, hemorrhage, or mass lesion ispresent. Ventricles are of normal size. No significant extra-axial fluid collection is present. The paranasal sinuses andmastoid air cells are clear. The osseous skull  is intact.  IMPRESSION: 1. No acute intracranial abnormalities.   Electronically Signed   By: Kerby Moors M.D.   On: 11/25/2013 20:00    Medications: I have reviewed the patient's current medications. Scheduled Meds: . aspirin EC  325 mg Oral Daily  . furosemide  20 mg Oral Daily  . [START ON 11/27/2013] Influenza vac split quadrivalent PF  0.5 mL Intramuscular Tomorrow-1000  . loratadine  10 mg Oral Daily  . metoprolol  25 mg Oral BID  . simvastatin  20 mg Oral q1800  . sodium chloride  3 mL Intravenous Q12H   Continuous Infusions: . heparin 650 Units/hr (11/26/13 0137)   PRN Meds:.acetaminophen, acetaminophen, albuterol, ALPRAZolam, alum & mag hydroxide-simeth, ondansetron (ZOFRAN) IV, ondansetron  Assessment/Plan: Principal Problem:   Elevated troponin-POC waiting for troponin I will add ckmb- pt is NPO for possible cath or stress. But would like to see troponin I.   Active Problems:   HYPERTENSION, UNSPECIFIED   COPD (chronic obstructive pulmonary disease) with emphysema   Pacemaker-Medtronic- no abnormalities on interrogation.   Dizziness   DNR    Cardiomegaly -Echo pending--previous echo 07/2012 EF 00-37% grade 1 diastolic dysfunction.   LOS: 1 day   Time spent with pt. :15 minutes. [facility] R  Nurse Practitioner Certified Pager [phone] or after 5pm and on weekends call [phone] 11/26/2013, 8:01 AM   I have seen and examined the patient along with Cecilie Kicks NP.  I have reviewed the chart, notes and new data.  I agree with NP's note.  Key new complaints: asymptomatic this AM, just had neb bronchodilators Key examination changes: right carotid bruit; distant breath sounds but no wheezes Key new findings / data: pacemaker download reviewed. Ventricular high rate monitoring is set fairly aggressively at 140 bpm and no recent events (occasional NSVT recorded in past was very brief). Troponin is elevated, but less than 1.  PLAN: Lexiscan Myoview Carotid  Doppler Echo pending Would not recommend cath (unless there is a major defect on her nuclear scan) - symptoms do not fit with an acute coronary syndrome, fairly recent cath showed minor obstruction, EF is normal  Sanda Klein, MD, [facility] [phone] 11/26/2013, 9:22 AM

## 2013-11-26 NOTE — Progress Notes (Signed)
Pt stated stomach upset has resolved and headache is only minimal now.

## 2013-11-26 NOTE — Progress Notes (Signed)
Patient evaluated this AM for dizziness, visual disturbance and mild elevated of Troponins without complaints of chest pain.  - CT head negative- unable to obtain MRI due to pacer. - Carotid duplex ordered as there was a right right carotid bruit on exam- no significant stenosis to explain symptoms - Myoview stress test performed and results still pending - follow on tele overnight for recurrence of symptoms - ECHO reveals new drop in EF- see report below- will add ACE I-  I will f/u in AM  Debbe Odea, MD    2 D ECHO Study Conclusions  - Compared to the echo in 07/2012, there is now a reduced EF to 40-45% with an LAD territory wall motion abnormality.

## 2013-11-26 NOTE — Progress Notes (Addendum)
CRITICAL VALUE ALERT  Critical value received:  TROPONIN =0.46  Date of notification:  11/26/2013  Time of notification:  1020  Critical value read back:  YES   Nurse who received alert:  Nona Dell RN    MD notified (1st page):  Cecilie Kicks, NP  Time of first page:  67  MD notified (2nd page):  Time of second page:  Responding MD:  Cecilie Kicks, NP   Time MD responded:  1023, NO  [place].

## 2013-11-26 NOTE — ED Notes (Signed)
Per admitting Dr. Posey Pronto, TSH and HEMOGLOBIN A1c, can be drawn with 0500 labs. RN Gretta Arab and main lab phlebotomist [place] notified.

## 2013-11-26 NOTE — Progress Notes (Signed)
ANTICOAGULATION CONSULT NOTE - Initial Consult  Pharmacy Consult for Heparin Indication: chest pain/ACS  Allergies  Allergen Reactions  . Hydrocodone-Acetaminophen     REACTION: GI distress    Patient Measurements: Height: 5' 3.39" (161 cm) IBW/kg (Calculated) : 53.29 Heparin Dosing Weight: 56 kg  Vital Signs: Temp: 98 F (36.7 C) (09/13 1822) Temp src: Oral (09/13 1822) BP: 155/83 mmHg (09/13 2330) Pulse Rate: 81 (09/13 2330)  Labs:  Recent Labs  11/25/13 1852  HGB 14.1  HCT 42.6  PLT 189  CREATININE 0.77    The CrCl is unknown because both a height and weight (above a minimum accepted value) are required for this calculation.   Medical History: Past Medical History  Diagnosis Date  . Complete AV block   . Presence of permanent cardiac pacemaker   . Tachycardia   . Hypertension   . Osteopenia   . Osteoarthritis   . COPD (chronic obstructive pulmonary disease)   . Pacemaker   . Shortness of breath   . Pneumonia   . Asthma   . GERD (gastroesophageal reflux disease)   . Family history of anesthesia complication     SISTER HAS DIFFICULTY WAKING  . CHF (congestive heart failure)   . UTI (urinary tract infection)     Medications:  Scheduled:  . aspirin EC  325 mg Oral Daily  . furosemide  20 mg Oral Daily  . loratadine  10 mg Oral Daily  . metoprolol  25 mg Oral BID  . sodium chloride  3 mL Intravenous Q12H   Infusions:    Assessment:  80 yr female with h/o CAD, CHF, pacemaker, COPD with complaint of dizziness  Elevated Troponin  Pharmacy consulted to dose IV heparin for ACS/STEMI  Goal of Therapy:  Heparin level 0.3-0.7 units/ml Monitor platelets by anticoagulation protocol: Yes   Plan:   Obtain baseline CBC, PT/INR, PTT  Begin heparin 3000 unit IV bolus x 1 followed by infusion @ 650 units/hr  Check heparin level 8 hr after heparin started  Follow heparin level & CBC daily while on heparin  Everette Rank,  PharmD 11/26/2013,1:01 AM

## 2013-11-26 NOTE — H&P (Signed)
[name] History and Physical  Patient: Jamie [name]  EHO:[id number]  DOB: [birth year]  DOS: the patient was seen and examined on 11/26/2013 PCP: Jerlyn Ly, MD  Chief Complaint: dizziness  HPI: Jamie [name] is a 80 y.o. female with Past medical history of complete AV block status post pacemaker 1, hypertension, COPD on 2 L of oxygen chronically, GERD him a possible diastolic dysfunction. The patient presented with complaints of dizziness. She mentions that she was playing solitaire on her computer and suddenly the cards and numbers started jumping on around with a spinning sensation. It was a roughly for 10-15 minutes at around 5:30 PM and then resolved. With this sensation not being present prior he decided to come to the ER At the time of my evaluation she was completely asymptomatic denied any chest pain shortness of breath dizziness lightheadedness focal deficit headache blurring of the vision nausea vomiting speech difficulty swallowing difficulty diarrhea burning urination any rash anywhere any changes in her medication. There was no fall no trauma no injury reported. No recent travel no leg swelling no leg tenderness.  The patient is coming from home And at her baseline independent for most of her ADL.  Review of Systems: as mentioned in the history of present illness.  A Comprehensive review of the other systems is negative.  Past Medical History  Diagnosis Date  . Complete AV block   . Presence of permanent cardiac pacemaker   . Tachycardia   . Hypertension   . Osteopenia   . Osteoarthritis   . COPD (chronic obstructive pulmonary disease)   . Pacemaker   . Shortness of breath   . Pneumonia   . Asthma   . GERD (gastroesophageal reflux disease)   . Family history of anesthesia complication     SISTER HAS DIFFICULTY WAKING  . CHF (congestive heart failure)   . UTI (urinary tract infection)    Past Surgical History  Procedure Laterality Date  . Hernia  repair  11/2002  . Eye surgery  10/2004  . Pacemaker placement      Medtronic  . Cholecystectomy     Social History:  reports that she quit smoking about 26 years ago. Her smoking use included Cigarettes. She has a 35 pack-year smoking history. She has never used smokeless tobacco. She reports that she does not drink alcohol or use illicit drugs.  Allergies  Allergen Reactions  . Hydrocodone-Acetaminophen     REACTION: GI distress    Family History  Problem Relation Age of Onset  . Heart disease Sister   . Breast cancer Sister     Prior to Admission medications   Medication Sig Start Date End Date Taking? Authorizing Provider  albuterol (PROAIR HFA) 108 (90 BASE) MCG/ACT inhaler Inhale 2 puffs into the lungs every 6 (six) hours as needed. For shortness of breath/wheezing   Yes Historical Provider, MD  ALPRAZolam Duanne Moron) 0.5 MG tablet Take 0.5 mg by mouth at bedtime as needed. For sleep   Yes Historical Provider, MD  aspirin EC 81 MG tablet Take 81 mg by mouth 3 (three) times a week.   Yes Historical Provider, MD  Calcium Carbonate (CALTRATE 600) 1500 MG TABS Take 600 mg by mouth daily.    Yes Historical Provider, MD  cholecalciferol (VITAMIN D) 1000 UNITS tablet Take 1,000 Units by mouth daily.     Yes Historical Provider, MD  Coenzyme Q10 (CO Q 10 PO) Take 1 capsule by mouth daily.   Yes Historical Provider,  MD  Cyanocobalamin (VITAMIN B 12 PO) Take 1 tablet by mouth daily.   Yes Historical Provider, MD  furosemide (LASIX) 20 MG tablet Take 1 tablet (20 mg total) by mouth daily. 07/25/12  Yes Ripudeep Krystal Eaton, MD  levalbuterol (XOPENEX) 0.63 MG/3ML nebulizer solution Take 0.63 mg by nebulization every 6 (six) hours as needed for shortness of breath. 07/25/12  Yes Ripudeep Krystal Eaton, MD  loratadine (CLARITIN) 10 MG tablet Take 10 mg by mouth daily.   Yes Historical Provider, MD  Magnesium 250 MG TABS Take 250 mg by mouth daily.    Yes Historical Provider, MD  metoprolol (LOPRESSOR) 50 MG  tablet Take 25 mg by mouth 2 (two) times daily.   Yes Historical Provider, MD  Misc Natural Products (OSTEO BI-FLEX ADV JOINT SHIELD PO) Take 2 capsules by mouth daily.     Yes Historical Provider, MD  Multiple Vitamins-Minerals (EYE VITAMINS PO) Take by mouth daily.     Yes Historical Provider, MD  Omega-3 Fatty Acids (FISH OIL) 1200 MG CAPS Take 1,200 mg by mouth daily.    Yes Historical Provider, MD  Polyethyl Glycol-Propyl Glycol (SYSTANE) 0.4-0.3 % SOLN Place 1 drop into both eyes 2 (two) times daily as needed (dry eyes).   Yes Historical Provider, MD  potassium chloride (K-DUR) 10 MEQ tablet Take 10 mEq by mouth 2 (two) times a week. Tuesday and friday   Yes Historical Provider, MD    Physical Exam: Filed Vitals:   11/25/13 2200 11/25/13 2230 11/25/13 2300 11/25/13 2330  BP: 125/58 135/65 156/68 155/83  Pulse: 70 72 72 81  Temp:      TempSrc:      Resp: 21 20 25 20   SpO2: 98% 96% 97% 99%    General: Alert, Awake and Oriented to Time, Place and Person. Appear in mild distress Eyes: PERRL ENT: Oral Mucosa clear moist. Neck: no JVD Cardiovascular: S1 and S2 Present, no Murmur, Peripheral Pulses Present Respiratory: Bilateral Air entry equal and Decreased, Clear to Auscultation, noCrackles, no wheezes Abdomen: Bowel Sound Present, Soft and Non tender Skin: no Rash Extremities: no Pedal edema, no calf tenderness Neurologic: Grossly no focal neuro deficit. Mental status AAOx3, speech normal, attention normal, Cranial Nerves PERRL, EOM normal and present, facial sensation to light touch present: , Motor strength bilateral equal strength 5/5, Sensation present to light touch, reflexes present knee and biceps, babinski negative, Proprioception normal, Cerebellar test normal finger nose finger.  Labs on Admission:  CBC:  Recent Labs Lab 11/25/13 1852  WBC 6.9  HGB 14.1  HCT 42.6  MCV 96.2  PLT 189    CMP     Component Value Date/Time   NA 138 11/25/2013 1852   K 4.5  11/25/2013 1852   CL 97 11/25/2013 1852   CO2 28 11/25/2013 1852   GLUCOSE 96 11/25/2013 1852   BUN 17 11/25/2013 1852   CREATININE 0.77 11/25/2013 1852   CALCIUM 9.5 11/25/2013 1852   PROT 6.6 08/16/2011 0913   ALBUMIN 3.5 08/16/2011 0913   AST 26 08/16/2011 0913   ALT 20 08/16/2011 0913   ALKPHOS 89 08/16/2011 0913   BILITOT 0.6 08/16/2011 0913   GFRNONAA 72* 11/25/2013 1852   GFRAA 84* 11/25/2013 1852    No results found for this basename: LIPASE, AMYLASE,  in the last 168 hours No results found for this basename: AMMONIA,  in the last 168 hours  No results found for this basename: CKTOTAL, CKMB, CKMBINDEX, TROPONINI,  in the last  168 hours BNP (last 3 results) No results found for this basename: PROBNP,  in the last 8760 hours  Radiological Exams on Admission: Dg Chest 2 View  11/25/2013   CLINICAL DATA:  Dizziness. Pacemaker. COPD. Hypertension. History of smoking.  EXAM: CHEST  2 VIEW  COMPARISON:  07/23/2012  FINDINGS: Patient's left-sided transvenous pacemaker with leads overlying the right atrium and right ventricle. The heart is mildly enlarged. There is perihilar peribronchial thickening. There are no focal consolidations or pleural effusions. No pulmonary edema. Surgical clips are noted in the right upper quadrant of the abdomen.  IMPRESSION: 1. Cardiomegaly without pulmonary edema. 2. Bronchitic change. 3.  No focal acute pulmonary abnormality.   Electronically Signed   By: Shon Hale M.D.   On: 11/25/2013 19:58   Ct Head Wo Contrast  11/25/2013   CLINICAL DATA:  Blurry vision and dizziness.  EXAM: CT HEAD WITHOUT CONTRAST  TECHNIQUE: Contiguous axial images were obtained from the base of the skull through the vertex without intravenous contrast.  COMPARISON:  08/17/2011  FINDINGS: Mild global atrophy appropriate to age noted. No acute cortical infarct, hemorrhage, or mass lesion ispresent. Ventricles are of normal size. No significant extra-axial fluid collection is present. The paranasal  sinuses andmastoid air cells are clear. The osseous skull is intact.  IMPRESSION: 1. No acute intracranial abnormalities.   Electronically Signed   By: Kerby Moors M.D.   On: 11/25/2013 20:00    EKG: Independently reviewed. Paced rhythm. Assessment/Plan Principal Problem:   Elevated troponin Active Problems:   HYPERTENSION, UNSPECIFIED   COPD (chronic obstructive pulmonary disease) with emphysema   Pacemaker-Medtronic   Dizziness   1. Elevated troponin The patient is presenting with complaints of dizziness. On further workup she was found to have any with a troponin which is progressively rising. Initially the patient was consulted with cardiology who recommends medicine admission. Patient does not have any complaints of chest pain or shortness of breath neither she has any angina equivalent symptoms. She does not appear to any significant changes on her EKG as well as pacemaker evaluation appears normal. Despite that she is found to have progressively worsening troponin level for which the patient will be admitted in the hospital. Patient will be given aspirin 325 as well as IV heparin for treatment. Patient will be monitored on telemetry kept n.p.o. or night Echogram in the morning. Continue with Lopressor. Cardiac catheterization 2012 was normal with nonobstructive coronary artery disease  2. Dizziness and lightheadedness. Patient does not have any focal deficit. CT of the head is negative. At present etiology of her dizziness and lightheadedness is unclear. Next I would continue to monitor serial neuro checks. Echocardiogram in the morning. PTOT consultation in the morning.  3. Hypertension. Blood pressure at present stable continue with Lopressor.  4. COPD At present does not appear to be any exacerbation continue to monitor continue oxygen.  Consults: Cardiology  DVT Prophylaxis: on anticoagulation Nutrition: N.p.o. except medication  Code Status: DNR/DNI  Family  Communication: [place] Daughter was present at bedside, opportunity was given to ask question and all questions were answered satisfactorily at the time of interview. Disposition: Admitted to inpatient in telemetry unit.  Author: Berle Mull, MD [facility] Pager: [phone] 11/26/2013, 12:45 AM    If 7PM-7AM, please contact night-coverage [email or web address] Password [email or web address]  **Disclaimer: This note may have been dictated with voice recognition software. Similar sounding words can inadvertently be transcribed and this note may contain transcription errors which may not have been corrected  upon publication of note.**

## 2013-11-26 NOTE — Progress Notes (Signed)
ANTICOAGULATION CONSULT NOTE - Follow Up Consult  Pharmacy Consult for Heparin Indication: chest pain/ACS  Allergies  Allergen Reactions  . Hydrocodone-Acetaminophen     REACTION: GI distress    Patient Measurements: Height: 5' 3.39" (161 cm) Weight: 132 lb 6.4 oz (60.056 kg) IBW/kg (Calculated) : 53.29 Heparin Dosing Weight: 60 kg  Vital Signs: Temp: 98.4 F (36.9 C) (09/14 0835) Temp src: Oral (09/14 0835) BP: 127/56 mmHg (09/14 1204) Pulse Rate: 74 (09/14 0835)  Labs:  Recent Labs  11/25/13 1852 11/26/13 0855  HGB 14.1 13.7  HCT 42.6 40.9  PLT 189 186  APTT  --  62*  LABPROT  --  15.3*  INR  --  1.21  HEPARINUNFRC  --  0.15*  CREATININE 0.77 0.68  CKTOTAL  --  84  CKMB  --  4.1*  TROPONINI  --  0.46*    Estimated Creatinine Clearance: 40.1 ml/min (by C-G formula based on Cr of 0.68).   Medications:  Scheduled:  . aspirin EC  325 mg Oral Daily  . furosemide  20 mg Oral Daily  . [START ON 11/27/2013] Influenza vac split quadrivalent PF  0.5 mL Intramuscular Tomorrow-1000  . loratadine  10 mg Oral Daily  . metoprolol  25 mg Oral BID  . simvastatin  20 mg Oral q1800  . sodium chloride  3 mL Intravenous Q12H   Infusions:  . sodium chloride    . heparin 650 Units/hr (11/26/13 7654)    Assessment: 80 yo F presented to [facility] with dizziness x 10-15 minutes that later resolved. In ED, found to have elevated troponin. No CP, SOB, or anginal sx.  Pt initiated on heparin, transferred to [facility], and just completed stress test.  Heparin level was subtherapeutic on 650 units/hr.  Goal of Therapy:  Heparin level 0.3-0.7 units/ml Monitor platelets by anticoagulation protocol: Yes   Plan:  Increase heparin infusion to 800 units/hr Heparin level in 8 hours Follow up results of stress test Heparin level and CBC daily while on heparin  [employer], Pharm.D., BCPS Clinical Pharmacist Pager [phone] 11/26/2013 1:29 PM

## 2013-11-26 NOTE — Progress Notes (Signed)
  Echocardiogram 2D Echocardiogram has been performed.  Diamond Nickel 11/26/2013, 3:06 PM

## 2013-11-26 NOTE — Progress Notes (Signed)
Lexiscan complete.  She tolerated the procedure well.  Interpretation to follow.  Avrohom Mckelvin, PA-C

## 2013-11-26 NOTE — Progress Notes (Signed)
ANTICOAGULATION CONSULT NOTE - Follow Up Consult  Pharmacy Consult for heparin Indication: chest pain/ACS  Labs:  Recent Labs  11/25/13 1852 11/26/13 0855 11/26/13 2159  HGB 14.1 13.7  --   HCT 42.6 40.9  --   PLT 189 186  --   APTT  --  62*  --   LABPROT  --  15.3*  --   INR  --  1.21  --   HEPARINUNFRC  --  0.15* 0.10*  CREATININE 0.77 0.68  --   CKTOTAL  --  84  --   CKMB  --  4.1*  --   TROPONINI  --  0.46*  --     Assessment: 80yo female remains subtherapeutic on heparin with lower level despite rate increase (prior level likely affected by bolus).  Goal of Therapy:  Heparin level 0.3-0.7 units/ml   Plan:  Will increase heparin gtt by 3-4 units/kg/hr to 1000 units/hr and check level in [place], PharmD, BCPS  11/26/2013,11:26 PM

## 2013-11-26 NOTE — Progress Notes (Signed)
[facility] progress note. Chief complaint. Transfer note. History of present illness. This 80 year old female presented to [facility] emergency room with complaints of dizziness. This lasted 10-15 minutes and then resolved. As part of workup she was noted to have elevated troponins. This was discussed with cardiology and they recommended transfer to [facility] and medical admission. Patient denied any actual chest pain. Patient to was initiated on aspirin and IV heparin per treatment. She had a cardiac catheterization in 2012 which was normal. CT scan of the head was negative. Plan is for echocardiogram post transfer. Patient has now arrived in transfer to [facility]. Am seeing her at bedside to ensure she remains stable and that her orders have transferred appropriately. She has no complaints and particularly she indicates that the dizziness/visual disturbances have not recurred and that she has no chest pain. Vital signs. Temperature 98.3, pulse 80, respiration 20, blood pressure 147/66. O2 sats 98%. General appearance. Elderly female who is alert and in no distress. Cardiac. Rate and rhythm regular. No edema. No jugular venous distention. Lungs. Breath sounds are reduced midlung to base bilaterally otherwise clear without distress or cough. Patient states she is chronically on low-flow nasal cannula oxygen. Abdomen. Soft with positive bowel sounds. No pain. Extremities. Pedal pulses present. No peripheral edema. Impression/plan. Problem #1. Elevated troponin. Management with aspirin 325 mg and IV heparin drip. Patient is currently chest pain-free. For echocardiogram in the morning and cardiology consult. Problem #2. Dizziness and lightheadedness. No focal neurologic deficits. CT scan of the head was negative. No recurrence in the dizziness or visual changes. We'll follow serial neuro checks. Problem #3. Hypertension. Continue home medications. Problem #4. COPD. Patient on  chronic nasal cannula oxygen. Respiratory status is stable. Patient appears clinically stable post transfer. All orders appear to of transferred appropriately.

## 2013-11-26 NOTE — Consult Note (Signed)
CARDIOLOGY CONSULT NOTE  Patient ID: Jamie [name], MRN: [id number], DOB/AGE: 1922-12-12 80 y.o. Admit date: 11/25/2013 Date of Consult: 11/26/2013  Primary Physician: Jerlyn Ly, MD Primary Cardiologist: Caryl Comes  Chief Complaint: dizziness Reason for Consultation: elevated troponin  HPI: 80 y.o. female w/ PMHx significant for complete AV block s/p pacer, HTN, HFpEF, COPD who presented to [facility] on 11/26/2013 with complaints of an episode of dizziness. She reports that she was playing Solitaire on the computer when the cards began to spin on the screen. Lasted for approx 10 minutes. She felt lightheaded but at no time did she pass out. She closed her eyes and put her head down. She transferred to her living room chair and then the symptoms completely resolved.  At no time was there any chest pain, palpitations or nausea. Denies any focal weakness. No PND, orthopnea or LE edema.  Review of notes indicate prior cath in 2012 demonstrating non-obstructive disease. Interestingly, at that time, she also had elevated troponins but no culprit vessels could be found.  In the ER, initial troponin was negative but followup POC troponin elevated. Upon repeat to determine if erroneous, still elevated. Dual chamber medtronic device interrogated and demonstrated no abnormalities.   Due to the elevated troponin, heparin was started.    Past Medical History  Diagnosis Date  . Complete AV block   . Presence of permanent cardiac pacemaker   . Tachycardia   . Hypertension   . Osteopenia   . Osteoarthritis   . COPD (chronic obstructive pulmonary disease)   . Pacemaker   . Shortness of breath   . Pneumonia   . Asthma   . GERD (gastroesophageal reflux disease)   . Family history of anesthesia complication     SISTER HAS DIFFICULTY WAKING  . CHF (congestive heart failure)   . UTI (urinary tract infection)       Surgical History:  Past Surgical History  Procedure Laterality Date  .  Hernia repair  11/2002  . Eye surgery  10/2004  . Pacemaker placement      Medtronic  . Cholecystectomy       Home Meds: Prior to Admission medications   Medication Sig Start Date End Date Taking? Authorizing Provider  albuterol (PROAIR HFA) 108 (90 BASE) MCG/ACT inhaler Inhale 2 puffs into the lungs every 6 (six) hours as needed. For shortness of breath/wheezing   Yes Historical Provider, MD  ALPRAZolam Duanne Moron) 0.5 MG tablet Take 0.5 mg by mouth at bedtime as needed. For sleep   Yes Historical Provider, MD  aspirin EC 81 MG tablet Take 81 mg by mouth 3 (three) times a week.   Yes Historical Provider, MD  Calcium Carbonate (CALTRATE 600) 1500 MG TABS Take 600 mg by mouth daily.    Yes Historical Provider, MD  cholecalciferol (VITAMIN D) 1000 UNITS tablet Take 1,000 Units by mouth daily.     Yes Historical Provider, MD  Coenzyme Q10 (CO Q 10 PO) Take 1 capsule by mouth daily.   Yes Historical Provider, MD  Cyanocobalamin (VITAMIN B 12 PO) Take 1 tablet by mouth daily.   Yes Historical Provider, MD  furosemide (LASIX) 20 MG tablet Take 1 tablet (20 mg total) by mouth daily. 07/25/12  Yes Ripudeep Krystal Eaton, MD  levalbuterol (XOPENEX) 0.63 MG/3ML nebulizer solution Take 0.63 mg by nebulization every 6 (six) hours as needed for shortness of breath. 07/25/12  Yes Ripudeep Krystal Eaton, MD  loratadine (CLARITIN) 10 MG tablet Take 10 mg by  mouth daily.   Yes Historical Provider, MD  Magnesium 250 MG TABS Take 250 mg by mouth daily.    Yes Historical Provider, MD  metoprolol (LOPRESSOR) 50 MG tablet Take 25 mg by mouth 2 (two) times daily.   Yes Historical Provider, MD  Misc Natural Products (OSTEO BI-FLEX ADV JOINT SHIELD PO) Take 2 capsules by mouth daily.     Yes Historical Provider, MD  Multiple Vitamins-Minerals (EYE VITAMINS PO) Take by mouth daily.     Yes Historical Provider, MD  Omega-3 Fatty Acids (FISH OIL) 1200 MG CAPS Take 1,200 mg by mouth daily.    Yes Historical Provider, MD  Polyethyl  Glycol-Propyl Glycol (SYSTANE) 0.4-0.3 % SOLN Place 1 drop into both eyes 2 (two) times daily as needed (dry eyes).   Yes Historical Provider, MD  potassium chloride (K-DUR) 10 MEQ tablet Take 10 mEq by mouth 2 (two) times a week. Tuesday and friday   Yes Historical Provider, MD    Inpatient Medications:  . aspirin EC  325 mg Oral Daily  . furosemide  20 mg Oral Daily  . loratadine  10 mg Oral Daily  . metoprolol  25 mg Oral BID  . sodium chloride  3 mL Intravenous Q12H   . heparin 650 Units/hr (11/26/13 0137)    Allergies:  Allergies  Allergen Reactions  . Hydrocodone-Acetaminophen     REACTION: GI distress    History   Social History  . Marital Status: Widowed    Spouse Name: N/A    Number of Children: N/A  . Years of Education: N/A   Occupational History  . retired    Social History Main Topics  . Smoking status: Former Smoker -- 1.00 packs/day for 35 years    Types: Cigarettes    Quit date: 03/16/1987  . Smokeless tobacco: Never Used  . Alcohol Use: No  . Drug Use: No  . Sexual Activity: No   Other Topics Concern  . Not on file   Social History Narrative  . No narrative on file     Family History  Problem Relation Age of Onset  . Heart disease Sister   . Breast cancer Sister      Review of Systems: General: negative for chills, fever, night sweats or weight changes.  Cardiovascular: see HPI Dermatological: negative for rash Respiratory: negative for cough or wheezing Urologic: negative for hematuria Abdominal: negative for nausea, vomiting, diarrhea, bright red blood per rectum, melena, or hematemesis Neurologic: negative for visual changes, syncope, or dizziness All other systems reviewed and are otherwise negative except as noted above.  Labs: No results found for this basename: CKTOTAL, CKMB, TROPONINI,  in the last 72 hours Lab Results  Component Value Date   WBC 6.9 11/25/2013   HGB 14.1 11/25/2013   HCT 42.6 11/25/2013   MCV 96.2 11/25/2013    PLT 189 11/25/2013    Recent Labs Lab 11/25/13 1852  NA 138  K 4.5  CL 97  CO2 28  BUN 17  CREATININE 0.77  CALCIUM 9.5  GLUCOSE 96   Lab Results  Component Value Date   CHOL 156 11/05/2010   HDL 55 11/05/2010   LDLCALC 91 11/05/2010   TRIG 50 11/05/2010    Radiology/Studies:  Dg Chest 2 View  11/25/2013   CLINICAL DATA:  Dizziness. Pacemaker. COPD. Hypertension. History of smoking.  EXAM: CHEST  2 VIEW  COMPARISON:  07/23/2012  FINDINGS: Patient's left-sided transvenous pacemaker with leads overlying the right atrium and right  ventricle. The heart is mildly enlarged. There is perihilar peribronchial thickening. There are no focal consolidations or pleural effusions. No pulmonary edema. Surgical clips are noted in the right upper quadrant of the abdomen.  IMPRESSION: 1. Cardiomegaly without pulmonary edema. 2. Bronchitic change. 3.  No focal acute pulmonary abnormality.   Electronically Signed   By: Shon Hale M.D.   On: 11/25/2013 19:58   Ct Head Wo Contrast  11/25/2013   CLINICAL DATA:  Blurry vision and dizziness.  EXAM: CT HEAD WITHOUT CONTRAST  TECHNIQUE: Contiguous axial images were obtained from the base of the skull through the vertex without intravenous contrast.  COMPARISON:  08/17/2011  FINDINGS: Mild global atrophy appropriate to age noted. No acute cortical infarct, hemorrhage, or mass lesion ispresent. Ventricles are of normal size. No significant extra-axial fluid collection is present. The paranasal sinuses andmastoid air cells are clear. The osseous skull is intact.  IMPRESSION: 1. No acute intracranial abnormalities.   Electronically Signed   By: Kerby Moors M.D.   On: 11/25/2013 20:00    EKG: sinus a-sense, v-pace  Physical Exam: Blood pressure 147/66, pulse 80, temperature 98.2 F (36.8 C), temperature source Oral, resp. rate 20, height 5' 3.39" (1.61 m), weight 60.056 kg (132 lb 6.4 oz), SpO2 98.00%. General: Well developed, well nourished, in no acute  distress. Appears younger than stated age Head: Normocephalic, atraumatic, sclera non-icteric, no xanthomas, nares are without discharge.  Neck: Supple. Negative for carotid bruits. JVD not elevated. Lungs: Clear bilaterally to auscultation without wheezes, rales, or rhonchi. Breathing is unlabored. Heart: RRR with S1 S2. No murmurs, rubs, or gallops appreciated. Abdomen: Soft, non-tender, non-distended with normoactive bowel sounds. No hepatomegaly. No rebound/guarding. No obvious abdominal masses. Msk:  Strength and tone appear normal for age. Extremities: No clubbing or cyanosis. No edema.  Distal pedal pulses are 2+ and equal bilaterally. Neuro: Alert and oriented X 3. Moves all extremities spontaneously. Psych:  Responds to questions appropriately with a normal affect.   Problem List 1. Elevated troponin, possible ACS 2. Visual changes 3. HFpEF, appears euvolemic 4. Complete AV block, s/p pacer 5. COPD 6. HTN   Assessment and Plan:  80 y.o. female w/ PMHx significant for complete AV block s/p pacer, nonobs CAD by cath 2012, HTN, HFpEF, COPD who presented to [facility] on 11/26/2013 with complaints of an episode of dizziness; found to have elevated troponin.  Rather extremely atypical presentation for acute coronary syndrome. No focal deficits, no evidence of stroke on head CT. EKG not able to be interpreted due to paced rhythm. Interesting that she had a rather atypical presentation in 2012 (UTI, atypical chest pain) and had elevated troponins but a cath that demonstrated non-obstructive disease. Will maximize medical management with aspirin, heparin and continuing her metoprolol. Though her cholesterol numbers in 2012 were okay, will add statin for pleiotropic benefits. Echo is pending which will also help determine if high risk (reduced EF, wall motion abnormalities, etc.).   Decision regarding invasive catheterization vs not is difficult: she is healthy and if this is truly  acute coronary syndrome, she would benefit from an invasive strategy. However, she is also an elderly female (higher risk of cath complications) and a prior cath in the setting of elevated troponins was negative and the symptoms here are very atypical. Will re-assess once additional data (troponins, echo, recurrent symptoms) are available.  Regarding HFpEF, she appears to be evolemic. Continue lasix.  Pacer interrogated; functioning normally.  Noted that the patient is  DNR.   Recs: -continue aspirin and metoprolol -add statin (done) -continue heparin -echo pending -keep NPO for possible invasive catheterization vs. Noninvasive risk stratification (adenosine MIBI)  Thank you for your assistance with this patient. Please call with questions. Will follow up in the AM.  Signed, Garhett Bernhard C. MD 11/26/2013, 3:05 AM

## 2013-11-26 NOTE — Progress Notes (Signed)
Pt was having GI upset and headache post Lexiscan so order given verbally to give 40mg  Aminophylline IV to pt, given.  Pt tolerated well.

## 2013-11-27 ENCOUNTER — Encounter: Payer: Self-pay | Admitting: Cardiology

## 2013-11-27 DIAGNOSIS — I5032 Chronic diastolic (congestive) heart failure: Secondary | ICD-10-CM

## 2013-11-27 DIAGNOSIS — R799 Abnormal finding of blood chemistry, unspecified: Secondary | ICD-10-CM | POA: Diagnosis not present

## 2013-11-27 LAB — CBC
HCT: 42.5 % (ref 36.0–46.0)
Hemoglobin: 14.2 g/dL (ref 12.0–15.0)
MCH: 31.7 pg (ref 26.0–34.0)
MCHC: 33.4 g/dL (ref 30.0–36.0)
MCV: 94.9 fL (ref 78.0–100.0)
PLATELETS: 164 10*3/uL (ref 150–400)
RBC: 4.48 MIL/uL (ref 3.87–5.11)
RDW: 13.1 % (ref 11.5–15.5)
WBC: 6.1 10*3/uL (ref 4.0–10.5)

## 2013-11-27 LAB — LIPID PANEL
Cholesterol: 191 mg/dL (ref 0–200)
HDL: 78 mg/dL (ref >39)
LDL Cholesterol: 102 mg/dL — ABNORMAL HIGH (ref 0–99)
TRIGLYCERIDES: 53 mg/dL (ref <150)
Total CHOL/HDL Ratio: 2.4 RATIO
VLDL: 11 mg/dL (ref 0–40)

## 2013-11-27 LAB — HEPARIN LEVEL (UNFRACTIONATED): HEPARIN UNFRACTIONATED: 0.46 [IU]/mL (ref 0.30–0.70)

## 2013-11-27 MED ORDER — ATORVASTATIN CALCIUM 40 MG PO TABS
40.0000 mg | ORAL_TABLET | Freq: Every day | ORAL | Status: DC
  Filled 2013-11-27 (×2): qty 1

## 2013-11-27 MED ORDER — ENOXAPARIN SODIUM 40 MG/0.4ML ~~LOC~~ SOLN
40.0000 mg | SUBCUTANEOUS | Status: DC
  Administered 2013-11-27: 40 mg via SUBCUTANEOUS
  Filled 2013-11-27 (×2): qty 0.4

## 2013-11-27 MED ORDER — ASPIRIN EC 81 MG PO TBEC
81.0000 mg | DELAYED_RELEASE_TABLET | Freq: Every day | ORAL | Status: DC
  Administered 2013-11-28: 81 mg via ORAL
  Filled 2013-11-27: qty 1

## 2013-11-27 MED ORDER — LISINOPRIL 5 MG PO TABS
5.0000 mg | ORAL_TABLET | Freq: Once | ORAL | Status: AC
  Administered 2013-11-27: 5 mg via ORAL
  Filled 2013-11-27: qty 1

## 2013-11-27 NOTE — Care Management Note (Signed)
    Page 1 of 1   11/28/2013     10:26:59 AM CARE MANAGEMENT NOTE 11/28/2013  Patient:  CHALISE, PE   Account Number:  [id number]  Date Initiated:  11/27/2013  Documentation initiated by:  GRAVES-BIGELOW,Shawan Tosh  Subjective/Objective Assessment:   Pt admitted for Nstemi and dizziness.     Action/Plan:   No needs from CM at this time, however will continue to monitor.   Anticipated DC Date:  11/28/2013   Anticipated DC Plan:  [place]  CM consult      Choice offered to / List presented to:             Status of service:  Completed, signed off Medicare Important Message given?  YES (If response is "NO", the following Medicare IM given date fields will be blank) Date Medicare IM given:  11/27/2013 Medicare IM given by:  GRAVES-BIGELOW,Emilee Market Date Additional Medicare IM given:   Additional Medicare IM given by:    Discharge Disposition:  HOME/SELF CARE  Per UR Regulation:  Reviewed for med. necessity/level of care/duration of stay  If discussed at [place] of Stay Meetings, dates discussed:    Comments:  11-28-13 [place], RN,BSN [phone] CM did discuss [facility] RN services with pt in ref to disease management. Pt states she has DME 02 via [place]- pt declining [place] services  at this time. Will continue to monitor.

## 2013-11-27 NOTE — Progress Notes (Signed)
[name] Progress Note   Jamie [name] ZSW:[id number] DOB: 09/29/1922 DOA: 11/25/2013 PCP: Jerlyn Ly, MD  Brief narrative: Jamie [name] is a 80 y.o. female with Past medical history of complete AV block status post pacemaker 1, hypertension, COPD on 2 L of oxygen chronically, GERD him a possible diastolic dysfunction. She presented with complaints of dizziness. She mentions that she was playing solitaire on her computer and suddenly the cards and numbers started jumping on around with a spinning sensation.  It was a roughly for 10-15 minutes at around 5:30 PM and then resolved. With this sensation not being present prior he decided to come to the ER  Subjective: No further symptoms.  Assessment/Plan: Principal Problem:   Elevated troponin - Myoview stress test reveals a large scar in the mid LAD territory- per cardiology, she likely had the infarct a few wks ago (pt noted indigestion).  - cont ASA, B blocker -  Lipid profile reveals LDL of 102- will add statin   Active Problems: Newly diagnosed systolic CHF - EF 32% - Added ACE yesterday - Lasix started on admission - will need to see if cardiology would like to continue this at home  Visual disturbance/ dizziness - one episode- possibly TIA- normal CT head- MRI cannot be done due to pacemaker - carotid duplex normal despite right carotid bruit on exam    HYPERTENSION, UNSPECIFIED   COPD (chronic obstructive pulmonary disease) with emphysema - has been stable on 2 L O2    Pacemaker-Medtronic   Code Status: DNR Family Communication: with daughter Disposition Plan: home DVT prophylaxis: Lovenox  Consultants: cardiology  Procedures: ECHO Carotid duplex  Antibiotics: Anti-infectives   None         Objective: Filed Weights   11/26/13 0100 11/26/13 0223 11/27/13 0609  Weight: 56.246 kg (124 lb) 60.056 kg (132 lb 6.4 oz) 61.689 kg (136 lb)    Intake/Output Summary (Last 24 hours) at 11/27/13  1433 Last data filed at 11/26/13 2323  Gross per 24 hour  Intake    154 ml  Output      0 ml  Net    154 ml     Vitals Filed Vitals:   11/27/13 0609 11/27/13 0833 11/27/13 1016 11/27/13 1215  BP: 134/57 114/44 130/54 126/68  Pulse: 63 72 72   Temp: 98.2 F (36.8 C) 98 F (36.7 C)    TempSrc: Oral Oral    Resp: 18 18    Height:      Weight: 61.689 kg (136 lb)     SpO2: 99% 99%      Exam: General: No acute respiratory distress Lungs: Clear to auscultation bilaterally without wheezes or crackles Cardiovascular: Regular rate and rhythm without murmur gallop or rub normal S1 and S2 Abdomen: Nontender, nondistended, soft, bowel sounds positive, no rebound, no ascites, no appreciable mass Extremities: No significant cyanosis, clubbing, or edema bilateral lower extremities  Data Reviewed: Basic Metabolic Panel:  Recent Labs Lab 11/25/13 1852 11/26/13 0855  NA 138 142  K 4.5 4.4  CL 97 100  CO2 28 27  GLUCOSE 96 99  BUN 17 10  CREATININE 0.77 0.68  CALCIUM 9.5 9.1   Liver Function Tests:  Recent Labs Lab 11/26/13 0855  AST 23  ALT 14  ALKPHOS 77  BILITOT 0.8  PROT 6.2  ALBUMIN 3.5   No results found for this basename: LIPASE, AMYLASE,  in the last 168 hours No results found for this basename: AMMONIA,  in  the last 168 hours CBC:  Recent Labs Lab 11/25/13 1852 11/26/13 0855 11/27/13 0720  WBC 6.9 6.1 6.1  HGB 14.1 13.7 14.2  HCT 42.6 40.9 42.5  MCV 96.2 94.2 94.9  PLT 189 186 164   Cardiac Enzymes:  Recent Labs Lab 11/26/13 0855  CKTOTAL 84  CKMB 4.1*  TROPONINI 0.46*   BNP (last 3 results) No results found for this basename: PROBNP,  in the last 8760 hours CBG:  Recent Labs Lab 11/25/13 1829  GLUCAP 85    No results found for this or any previous visit (from the past 240 hour(s)).   Studies:  Recent x-ray studies have been reviewed in detail by the Attending Physician  Scheduled Meds:  Scheduled Meds: . [START ON 11/28/2013]  aspirin EC  81 mg Oral Daily  . furosemide  20 mg Oral Daily  . lisinopril  5 mg Oral Daily  . loratadine  10 mg Oral Daily  . metoprolol  25 mg Oral BID  . simvastatin  20 mg Oral q1800  . sodium chloride  3 mL Intravenous Q12H   Continuous Infusions:   Time spent on care of this patient: 35 min   [place], MD 11/27/2013, 2:33 PM  LOS: 2 days   [name] Office  [phone] Pager - Text Page per [email or web address]  If 7PM-7AM, please contact night-coverage [email or web address]

## 2013-11-27 NOTE — Progress Notes (Signed)
Patient Name: Jamie [name] Date of Encounter: 11/27/2013  Principal Problem:   Elevated troponin Active Problems:   HYPERTENSION, UNSPECIFIED   COPD (chronic obstructive pulmonary disease) with emphysema   Pacemaker-Medtronic   Dizziness    Patient Profile: 80 yo female w/ PMHx significant for complete AV block s/p pacer, HTN, HFpEF, COPD who presented to [facility] on 11/26/2013 with complaints of dizziness. Pacemaker interrogation without abnormalities. + elevated troponin POC. Previous hx of cath 20102 with non obstructive disease and EF of 50-55% with elevated troponins of 0.49. Echo 9/14 shows new reduced EF at 40-45% and LAD territory wall motion abnormality. Lexiscan performed on 9/14 pending.   SUBJECTIVE: No complaints. Rested well. No chest pain, shortness of breath, dizziness, presyncopal symptoms, orthopnea, PND, or palpations. Reports slight increase in DOE, but able to get up and around well.   OBJECTIVE Filed Vitals:   11/26/13 2037 11/27/13 0102 11/27/13 0609 11/27/13 0833  BP: 121/60 124/59 134/57 114/44  Pulse: 69 68 63 72  Temp:  97.6 F (36.4 C) 98.2 F (36.8 C) 98 F (36.7 C)  TempSrc: Oral Oral Oral Oral  Resp: 18 18 18 18   Height:      Weight:   136 lb (61.689 kg)   SpO2: 99% 99% 99% 99%    Intake/Output Summary (Last 24 hours) at 11/27/13 0852 Last data filed at 11/26/13 2323  Gross per 24 hour  Intake    154 ml  Output      0 ml  Net    154 ml   Filed Weights   11/26/13 0100 11/26/13 0223 11/27/13 0609  Weight: 124 lb (56.246 kg) 132 lb 6.4 oz (60.056 kg) 136 lb (61.689 kg)    PHYSICAL EXAM General: Well developed, well nourished, female in no acute distress. Looks younger than stated age. Head: Normocephalic, atraumatic.  Neck: Supple without bruits, JVD not elevated. Lungs:  Resp regular and unlabored, breath sounds distant, but CTA. Heart: RRR, S1, S2, no S3, S4, or murmur; no rub. Abdomen: Soft, non-tender, non-distended,  BS + x 4.  Extremities: No clubbing, cyanosis, no edema.  Neuro: Alert and oriented X 3. Moves all extremities spontaneously. Psych: Normal affect.  LABS: CBC: Recent Labs  11/26/13 0855 11/27/13 0720  WBC 6.1 6.1  HGB 13.7 14.2  HCT 40.9 42.5  MCV 94.2 94.9  PLT 186 164   INR: Recent Labs  11/26/13 0855  INR 7.32   Basic Metabolic Panel: Recent Labs  11/25/13 1852 11/26/13 0855  NA 138 142  K 4.5 4.4  CL 97 100  CO2 28 27  GLUCOSE 96 99  BUN 17 10  CREATININE 0.77 0.68  CALCIUM 9.5 9.1   Liver Function Tests: Recent Labs  11/26/13 0855  AST 23  ALT 14  ALKPHOS 77  BILITOT 0.8  PROT 6.2  ALBUMIN 3.5   Cardiac Enzymes: Recent Labs  11/26/13 0855  CKTOTAL 84  CKMB 4.1*  TROPONINI 0.46*    Recent Labs  11/25/13 2255 11/25/13 2355  TROPIPOC 0.23* 0.37*   Hemoglobin A1C: Recent Labs  11/26/13 0855  HGBA1C 5.9*   Thyroid Function Tests: Recent Labs  11/26/13 2159  TSH 2.080   TELE: ventricular paced rhythm   Radiology/Studies: Dg Chest 2 View 11/25/2013   CLINICAL DATA:  Dizziness. Pacemaker. COPD. Hypertension. History of smoking.  EXAM: CHEST  2 VIEW  COMPARISON:  07/23/2012  FINDINGS: Patient's left-sided transvenous pacemaker with leads overlying the right atrium and  right ventricle. The heart is mildly enlarged. There is perihilar peribronchial thickening. There are no focal consolidations or pleural effusions. No pulmonary edema. Surgical clips are noted in the right upper quadrant of the abdomen.  IMPRESSION: 1. Cardiomegaly without pulmonary edema. 2. Bronchitic change. 3.  No focal acute pulmonary abnormality.   Electronically Signed   By: Shon Hale M.D.   On: 11/25/2013 19:58   Ct Head Wo Contrast 11/25/2013   CLINICAL DATA:  Blurry vision and dizziness.  EXAM: CT HEAD WITHOUT CONTRAST  TECHNIQUE: Contiguous axial images were obtained from the base of the skull through the vertex without intravenous contrast.  COMPARISON:   08/17/2011  FINDINGS: Mild global atrophy appropriate to age noted. No acute cortical infarct, hemorrhage, or mass lesion ispresent. Ventricles are of normal size. No significant extra-axial fluid collection is present. The paranasal sinuses andmastoid air cells are clear. The osseous skull is intact.  IMPRESSION: 1. No acute intracranial abnormalities.   Electronically Signed   By: Kerby Moors M.D.   On: 11/25/2013 20:00   Echo: 11/26/13 Study Conclusions - Left ventricle: The cavity size was normal. Wall thickness was normal. Systolic function was mildly to moderately reduced. The estimated ejection fraction was in the range of 40% to 45%. There is mid to distal anterior and apical and inferoapical hypokinesis, suggesting LAD territory ischemia/infarct. Paced septal dyskinesis. Doppler parameters are consistent with abnormal left ventricular relaxation (grade 1 diastolic dysfunction). The E/e&' ratio is between 8-15, suggesting indeterminate LV filling pressure. - Mitral valve: Calcified annulus. There was trivial regurgitation. - Left atrium: The atrium was normal in size. - Right ventricle: The cavity size was normal. Wall thickness was normal. Pacer wire or catheter noted in right ventricle. - Right atrium: The atrium was at the upper limits of normal in size. Pacer wire or catheter noted in right atrium. - Tricuspid valve: There was mild regurgitation. - Pulmonary arteries: PA peak pressure: 23 mm Hg (S). Impressions: - Compared to the echo in 07/2012, there is now a reduced EF to 40-45% with an LAD territory wall motion abnormality.  Carotid Doppler: 11/26/13 Arterial flow: +--------------------+--------+--------+ Location V sys V ed  +--------------------+--------+--------+ Right CCA - proximal61 cm/s 7 cm/s  +--------------------+--------+--------+ Right CCA - distal 64 cm/s 13 cm/s  +--------------------+--------+--------+ Right ECA -70 cm/s-5 cm/s   +--------------------+--------+--------+ Right ICA - proximal-54 cm/s-15 cm/s +--------------------+--------+--------+ Right ICA - distal -60 cm/s-15 cm/s +--------------------+--------+--------+ Right vertebral -46 cm/s-9 cm/s  +--------------------+--------+--------+ Left CCA - proximal 70 cm/s 7 cm/s  +--------------------+--------+--------+ Left CCA - distal 69 cm/s 12 cm/s  +--------------------+--------+--------+ Left ECA -72 cm/s-2 cm/s  +--------------------+--------+--------+ Left ICA - proximal -68 cm/s-17 cm/s +--------------------+--------+--------+ Left ICA - distal -50 cm/s-15 cm/s +--------------------+--------+--------+ Left vertebral -62 cm/s-12 cm/s +--------------------+--------+--------+ ------------------------------------------------------------------- Summary: Bilateral: mild soft plaque origin ICA. 1-39% ICA stenosis. Vertebral artery flow is antegrade. ICA/CCA ratio: R-1.0 L-1.0.   Current Medications:  . aspirin EC  325 mg Oral Daily  . furosemide  20 mg Oral Daily  . Influenza vac split quadrivalent PF  0.5 mL Intramuscular Tomorrow-1000  . lisinopril  5 mg Oral Daily  . loratadine  10 mg Oral Daily  . metoprolol  25 mg Oral BID  . simvastatin  20 mg Oral q1800  . sodium chloride  3 mL Intravenous Q12H   . heparin 1,000 Units/hr (11/27/13 0548)    ASSESSMENT AND PLAN: Principal Problem:   Elevated troponin: Troponin continue rise slowly and was last drawn 9/14 AM and was 0.46. Echo performed  9/14 shows new reduction in EF to 40-45% with LAD territory wall motion abnormality. Lexiscan 9/14 pending On ASA, ACEI, BB, statin.   Active Problems:   HYPERTENSION, UNSPECIFIED- controlled while in hospital, continue ACEI, BB. Per IM.     COPD (chronic obstructive pulmonary disease) with emphysema- per IM     Pacemaker-Medtronic- no abnormalities seen on interrogation, occasional NSVT recorded in past was very  brief.     Dizziness- Carotid doppler on 9/14 shows no significant stenosis, not significant enough to explain symptoms.     Code Status: DNR   4 lb weight gain noted since 9/14, new reduced EF of 40-45%. Will obtain standing scale weight. No signs of extra volume on exam. Slight increase in DOE, but no orthopnea, shortness of breath or swelling. On Lasix 20mg  QD. Cr is 0.68. Lexiscan results pending. MD to see.    Signed,  Jamie Antigua, PA-S2   Seen and agree with changes made. Rosaria Ferries , PA-C 8:52 AM 11/27/2013  I have seen and examined the patient along with Rosaria Ferries , PA-C.  I have reviewed the chart, notes and new data.  I agree with PA's note.  Key new complaints: no change in baseline breathing Key examination changes: no overt signs of HF, no arrhythmia Key new findings / data: nuclear study shows LAD artery distribution scar, minimal ischemia, same area as described on echo.  PLAN: She appears to have a completed LAD infarction. Suspect this happened earlier (she had indigestion about 3 weeks ago and "backache" 1 week ago) and we may be seeing the tail end of the troponin leak. Discussed cath/PCI, but benefit would be low and she definitely wants to avoid invasive procedures. No overt CHF. She is already carefully monitoring salt intake and I advised daily weight monitoring. Start ACEi today and watch 24 hours (her diastolic BP is fairly low). Will see if we can drop the VT monitor zone a little more on her PPM.   Sanda Klein, MD, [facility] [phone] 11/27/2013, 12:05 PM

## 2013-11-27 NOTE — Progress Notes (Signed)
Lexiscan CL results reviewed with Dr. Meda Coffee.  IMPRESSION:  1. This is an intermediate risk scan with a large scar in the mid  LAD territory (including the diagonal branch) with no peri-infarct  ischemia.  2. Akinesis of the apical segments is well compensated by  hyperdynamic mid and basal segments and overall LVEF is preserved  (LVEF =60%).   Jamie [name]   Since she has no ischemia, no change in treatment plan for now, MD to review and advise.   Rosaria Ferries, PA-C 11/27/2013 10:28 AM Beeper [phone]

## 2013-11-27 NOTE — Clinical Documentation Improvement (Signed)
Noted per 11/25/13 H&P "...COPD on 2 L of oxygen chronically..."4. COPD- At present does not appear to be any exacerbation continue to monitor continue oxygen..." 11/26/13 Progr note..."Lungs. Breath sounds are reduced midlung to base bilaterally otherwise clear without distress or cough. Patient states she is chronically on low-flow nasal cannula oxygen.".Marland KitchenMarland Kitchen"Problem #4. COPD. Patient on chronic nasal cannula oxygen. Respiratory status is stable...."  For accurate DX specificity & severity can noted above clinical findings be further clarified/ linked w/ possible, likely or suspected condition being eval'd, mon'd & tx'd. orm  Possible Clinical Conditions? Acute Respiratory Failure Acute on Chronic Respiratory Failure Chronic Respiratory Failure Other Condition Cannot Clinically Determine   Supporting Information: See above note  Thank You, Ermelinda Das, RN, BSN, CCDS Certified Clinical Documentation Specialist Pager: [phone] [place]: Health Information Management

## 2013-11-27 NOTE — Progress Notes (Signed)
Patient complained of nausea and shakiness at 1835, vs obtained and documented in EPIC.  I paged Dr. Wynelle Cleveland received no call back.  Patient given ginger ale.  When reassessed at 1900 patient stated she was feeling better after ginger ale and belching.

## 2013-11-27 NOTE — Progress Notes (Signed)
ANTICOAGULATION CONSULT NOTE - Follow Up Consult  Pharmacy Consult for Heparin Indication: chest pain/ACS  Allergies  Allergen Reactions  . Hydrocodone-Acetaminophen     REACTION: GI distress    Patient Measurements: Height: 5' 3.39" (161 cm) Weight: 136 lb (61.689 kg) IBW/kg (Calculated) : 53.29 Heparin Dosing Weight: 61.6kg  Vital Signs: Temp: 98.2 F (36.8 C) (09/15 0609) Temp src: Oral (09/15 0609) BP: 134/57 mmHg (09/15 0609) Pulse Rate: 63 (09/15 0609)  Labs:  Recent Labs  11/25/13 1852 11/26/13 0855 11/26/13 2159 11/27/13 0720 11/27/13 0727  HGB 14.1 13.7  --  14.2  --   HCT 42.6 40.9  --  42.5  --   PLT 189 186  --  164  --   APTT  --  62*  --   --   --   LABPROT  --  15.3*  --   --   --   INR  --  1.21  --   --   --   HEPARINUNFRC  --  0.15* 0.10*  --  0.46  CREATININE 0.77 0.68  --   --   --   CKTOTAL  --  84  --   --   --   CKMB  --  4.1*  --   --   --   TROPONINI  --  0.46*  --   --   --     Estimated Creatinine Clearance: 40.1 ml/min (by C-G formula based on Cr of 0.68).   Medications:  Heparin @ 1000 units/hr  Assessment: 89yof started on heparin yesterday for elevated troponins. Recent cath showed minor obstruction so she underwent a stress test - results pending. Heparin level is therapeutic after rate increase this morning. CBC is stable. No bleeding reported.  Goal of Therapy:  Heparin level 0.3-0.7 units/ml Monitor platelets by anticoagulation protocol: Yes   Plan:  1) Continue heparin at 1000 units/hr 2) Follow up heparin level, CBC in AM 3) Follow up results of stress test  Deboraha Sprang 11/27/2013,8:08 AM

## 2013-11-27 NOTE — Clinical Documentation Improvement (Signed)
Noted 11/25/13 H&P..." h/o CHF (congestive heart failure)..." 11/27/13 Prog note.Marland KitchenMarland Kitchen"Previous hx of cath 20102 with non obstructive disease and EF of 50-55% with elevated troponins of 0.49. Echo 9/14 shows new reduced EF at 40-45% and LAD territory wall motion abnormality.".Marland KitchenMarland Kitchen"Reports slight increase in DOE, but able to get up and around well."..."4 lb weight gain noted since 9/14, new reduced EF of 40-45%. Will obtain standing scale weight. No signs of extra volume on exam. Slight increase in DOE, but no orthopnea, shortness of breath or swelling. On Lasix 20mg  QD.".Marland KitchenMarland Kitchen  For accurate Dx specificity & severity can noted "h/o CHF" be further specified w/ type & acuity for cond being mon'd, eval's & tx'd. Thank you  Possible Clinical Conditions? Chronic Systolic or Diastolic Congestive Heart Failure Chronic Systolic & Diastolic Congestive Heart Failure Acute Systolic or Diastolic Congestive Heart Failure Acute Systolic & Diastolic Congestive Heart Failure Acute on Chronic Systolic or Diastolic Congestive Heart Failure Acute on Chronic Systolic & Diastolic Congestive Heart Failure Other Condition Cannot Clinically Determine  Supporting Information: See above note  Thank You, Ermelinda Das, RN, BSN, CCDS Certified Clinical Documentation Specialist Pager: [place]: Health Information Management

## 2013-11-28 DIAGNOSIS — I2589 Other forms of chronic ischemic heart disease: Secondary | ICD-10-CM

## 2013-11-28 LAB — CBC
HEMATOCRIT: 39.9 % (ref 36.0–46.0)
Hemoglobin: 13.5 g/dL (ref 12.0–15.0)
MCH: 31.8 pg (ref 26.0–34.0)
MCHC: 33.8 g/dL (ref 30.0–36.0)
MCV: 93.9 fL (ref 78.0–100.0)
PLATELETS: 172 10*3/uL (ref 150–400)
RBC: 4.25 MIL/uL (ref 3.87–5.11)
RDW: 12.9 % (ref 11.5–15.5)
WBC: 6.5 10*3/uL (ref 4.0–10.5)

## 2013-11-28 LAB — HEPARIN LEVEL (UNFRACTIONATED): Heparin Unfractionated: 0.11 IU/mL — ABNORMAL LOW (ref 0.30–0.70)

## 2013-11-28 MED ORDER — LISINOPRIL 5 MG PO TABS: 5.0000 mg | ORAL_TABLET | Freq: Every day | ORAL | Status: DC

## 2013-11-28 MED ORDER — ROSUVASTATIN CALCIUM 10 MG PO TABS: 10.0000 mg | ORAL_TABLET | Freq: Every day | ORAL | Status: DC

## 2013-11-28 MED ORDER — NITROGLYCERIN 0.4 MG SL SUBL: 0.4000 mg | SUBLINGUAL_TABLET | SUBLINGUAL | Status: AC | PRN

## 2013-11-28 NOTE — Progress Notes (Signed)
[name] PROGRESS NOTE  Assessment/Plan: Elevated troponin: - Consulted cardiology, who recommended - Myoview stress test reveals a large scar in the mid LAD territory - On ASA, Beta blockers and statin.  Acute systolic heart failure: - EF 40 % on ACE-I, Beta blockers and lasix.  Visual disturbance/ dizziness:  - one episode- possibly TIA- normal CT head- MRI cannot be done due to pacemaker  - carotid duplex normal despite right carotid bruit on exam   HYPERTENSION, UNSPECIFIED  - Well controlled.  COPD (chronic obstructive pulmonary disease) with emphysema  - Has been stable on 2 L O2   Code Status: full Family Communication: none  Disposition Plan: inpatient   Consultants:  Cardiology  Procedures:  CT head  Carotid dupplex  Antibiotics:  none  HPI/Subjective: No complains  Objective: Filed Vitals:   11/28/13 0009 11/28/13 0343 11/28/13 0700 11/28/13 0800  BP: 107/46 124/49 130/52   Pulse: 63 72 80 82  Temp: 98.4 F (36.9 C) 98.3 F (36.8 C)  98.2 F (36.8 C)  TempSrc: Oral Oral  Oral  Resp: 18 18  18   Height:      Weight:  54.296 kg (119 lb 11.2 oz)    SpO2: 92% 93% 99%     Intake/Output Summary (Last 24 hours) at 11/28/13 0929 Last data filed at 11/28/13 0843  Gross per 24 hour  Intake    360 ml  Output      0 ml  Net    360 ml   Filed Weights   11/26/13 0223 11/27/13 0609 11/28/13 0343  Weight: 60.056 kg (132 lb 6.4 oz) 61.689 kg (136 lb) 54.296 kg (119 lb 11.2 oz)    Exam:  General: Alert, awake, oriented x3, in no acute distress.  HEENT: No bruits, no goiter. -JVD Heart: Regular rate and rhythm.  Lungs: Good air movement, clear Abdomen: Soft, nontender, nondistended, positive bowel sounds.   Data Reviewed: Basic Metabolic Panel:  Recent Labs Lab 11/25/13 1852 11/26/13 0855  NA 138 142  K 4.5 4.4  CL 97 100  CO2 28 27  GLUCOSE 96 99  BUN 17 10  CREATININE 0.77 0.68  CALCIUM 9.5 9.1   Liver Function  Tests:  Recent Labs Lab 11/26/13 0855  AST 23  ALT 14  ALKPHOS 77  BILITOT 0.8  PROT 6.2  ALBUMIN 3.5   No results found for this basename: LIPASE, AMYLASE,  in the last 168 hours No results found for this basename: AMMONIA,  in the last 168 hours CBC:  Recent Labs Lab 11/25/13 1852 11/26/13 0855 11/27/13 0720 11/28/13 0406  WBC 6.9 6.1 6.1 6.5  HGB 14.1 13.7 14.2 13.5  HCT 42.6 40.9 42.5 39.9  MCV 96.2 94.2 94.9 93.9  PLT 189 186 164 172   Cardiac Enzymes:  Recent Labs Lab 11/26/13 0855  CKTOTAL 84  CKMB 4.1*  TROPONINI 0.46*   BNP (last 3 results) No results found for this basename: PROBNP,  in the last 8760 hours CBG:  Recent Labs Lab 11/25/13 1829  GLUCAP 85    No results found for this or any previous visit (from the past 240 hour(s)).   Studies: Nm Myocar Multi W/spect W/wall Motion / Ef  11/27/2013   CLINICAL DATA:  Chest pain, SOB  EXAM: Lexiscan Myovue  TECHNIQUE: The patient received IV Lexiscan .4mg  over 15 seconds. 33.0 mCi of Technetium 49m Sestamibi injected at 30 seconds. Quantitative SPECT images were obtained in the vertical, horizontal and short axis  planes after a 45 minute delay. Rest images were obtained with similar planes and delay using 10.2 mCi of Technetium 4m Sestamibi.  FINDINGS: ECG:  V -paced rhythm  Symptoms: Chest discomfort post regadenoson infusion that resolved after 50 mg of aminophylline.  RAW Data: Significant extracardiac activity not affecting ability to read.  Quantitiative Gated SPECT EF: Akinesis of all of the apical segments including the true apex.  Perfusion Images: There is a large size severe severity irreversible defect in all of the apical segments, true apex and mid anterolateral wall.  SSS 26, SRS 26, SDS 0  IMPRESSION: 1. This is an intermediate risk scan with a large scar in the mid LAD territory (including the diagonal branch) with no peri-infarct ischemia.  2. Akinesis of the apical segments is well  compensated by hyperdynamic mid and basal segments and overall LVEF is preserved (LVEF =60%).  [name]   Electronically Signed   By: [name]   On: 11/27/2013 10:21    Scheduled Meds: . aspirin EC  81 mg Oral Daily  . atorvastatin  40 mg Oral q1800  . enoxaparin (LOVENOX) injection  40 mg Subcutaneous Q24H  . furosemide  20 mg Oral Daily  . lisinopril  5 mg Oral Daily  . loratadine  10 mg Oral Daily  . metoprolol  25 mg Oral BID  . sodium chloride  3 mL Intravenous Q12H   Continuous Infusions:    Charlynne Cousins  [name] Pager [phone]. If 8PM-8AM, please contact night-coverage at [email or web address], password [facility] 11/28/2013, 9:29 AM  LOS: 3 days

## 2013-11-28 NOTE — Discharge Summary (Addendum)
Physician Discharge Summary  Jamie [name]:[id number] DOB: 02-17-1923 DOA: 11/25/2013  PCP: Jerlyn Ly, MD  Admit date: 11/25/2013 Discharge date: 11/28/2013  Time spent: 35 minutes  Recommendations for Outpatient Follow-up:  Follow up with Cardiology  Discharge Diagnoses:  Principal Problem:   Elevated troponin Active Problems:   HYPERTENSION, UNSPECIFIED   COPD (chronic obstructive pulmonary disease) with emphysema   Pacemaker-Medtronic   Dizziness   Discharge Condition: stable  Diet recommendation: heart healthy  Filed Weights   11/26/13 0223 11/27/13 0609 11/28/13 0343  Weight: 60.056 kg (132 lb 6.4 oz) 61.689 kg (136 lb) 54.296 kg (119 lb 11.2 oz)    History of present illness:  80 y.o. female with Past medical history of complete AV block status post pacemaker 1, hypertension, COPD on 2 L of oxygen chronically, GERD him a possible diastolic dysfunction. The patient presented with complaints of dizziness. She mentions that she was playing solitaire on her computer and suddenly the cards and numbers started jumping on around with a spinning sensation. It was a roughly for 10-15 minutes at around 5:30 PM and then resolved. With this sensation not being present prior he decided to come to the ER  At the time of my evaluation she was completely asymptomatic denied any chest pain shortness of breath dizziness lightheadedness focal deficit headache blurring of the vision nausea vomiting speech difficulty swallowing difficulty diarrhea burning urination any rash anywhere any changes in her medication.  There was no fall no trauma no injury reported.   Hospital Course:  Elevated troponin due to possible out of hospital NSTEMI unknown time :  - Consulted cardiology, who recommended - Myoview stress test reveals a large scar in the mid LAD territory. - cardiology rec to treated conservatively. - On ASA, Beta blockers and statin.   Acute systolic heart failure:  - By echo EF  40 % on ACE-I, Beta blockers and lasix.   Visual disturbance/ dizziness:  - One episode- possibly TIA- normal CT head- MRI cannot be done due to pacemaker  - Carotid duplex normal.  HYPERTENSION, UNSPECIFIED  - Well controlled.   COPD (chronic obstructive pulmonary disease) with emphysema  - Has been stable on 2 L O2  Procedures:  Echo  Stress test  Consultations:  cardiology  Discharge Exam: Filed Vitals:   11/28/13 1005  BP: 127/60  Pulse: 72  Temp: 98.3 F (36.8 C)  Resp: 18    General: see progress note  Discharge Instructions You were cared for by a hospitalist during your hospital stay. If you have any questions about your discharge medications or the care you received while you were in the hospital after you are discharged, you can call the unit and asked to speak with the hospitalist on call if the hospitalist that took care of you is not available. Once you are discharged, your primary care physician will handle any further medical issues. Please note that NO REFILLS for any discharge medications will be authorized once you are discharged, as it is imperative that you return to your primary care physician (or establish a relationship with a primary care physician if you do not have one) for your aftercare needs so that they can reassess your need for medications and monitor your lab values.  Discharge Instructions   Diet - low sodium heart healthy    Complete by:  As directed      Increase activity slowly    Complete by:  As directed  Current Discharge Medication List    START taking these medications   Details  lisinopril (PRINIVIL,ZESTRIL) 5 MG tablet Take 1 tablet (5 mg total) by mouth daily. Qty: 30 tablet, Refills: 0    nitroGLYCERIN (NITROSTAT) 0.4 MG SL tablet Place 1 tablet (0.4 mg total) under the tongue every 5 (five) minutes as needed for chest pain. Qty: 15 tablet, Refills: 12    rosuvastatin (CRESTOR) 10 MG tablet Take 1 tablet (10  mg total) by mouth daily. Qty: 30 tablet, Refills: 0      CONTINUE these medications which have NOT CHANGED   Details  albuterol (PROAIR HFA) 108 (90 BASE) MCG/ACT inhaler Inhale 2 puffs into the lungs every 6 (six) hours as needed. For shortness of breath/wheezing    ALPRAZolam (XANAX) 0.5 MG tablet Take 0.5 mg by mouth at bedtime as needed. For sleep    aspirin EC 81 MG tablet Take 81 mg by mouth 3 (three) times a week.    Calcium Carbonate (CALTRATE 600) 1500 MG TABS Take 600 mg by mouth daily.     cholecalciferol (VITAMIN D) 1000 UNITS tablet Take 1,000 Units by mouth daily.      Coenzyme Q10 (CO Q 10 PO) Take 1 capsule by mouth daily.    Cyanocobalamin (VITAMIN B 12 PO) Take 1 tablet by mouth daily.    furosemide (LASIX) 20 MG tablet Take 1 tablet (20 mg total) by mouth daily. Qty: 30 tablet, Refills: 2    levalbuterol (XOPENEX) 0.63 MG/3ML nebulizer solution Take 0.63 mg by nebulization every 6 (six) hours as needed for shortness of breath.    loratadine (CLARITIN) 10 MG tablet Take 10 mg by mouth daily.    Magnesium 250 MG TABS Take 250 mg by mouth daily.     metoprolol (LOPRESSOR) 50 MG tablet Take 25 mg by mouth 2 (two) times daily.    Misc Natural Products (OSTEO BI-FLEX ADV JOINT SHIELD PO) Take 2 capsules by mouth daily.      Multiple Vitamins-Minerals (EYE VITAMINS PO) Take by mouth daily.      Omega-3 Fatty Acids (FISH OIL) 1200 MG CAPS Take 1,200 mg by mouth daily.     Polyethyl Glycol-Propyl Glycol (SYSTANE) 0.4-0.3 % SOLN Place 1 drop into both eyes 2 (two) times daily as needed (dry eyes).    potassium chloride (K-DUR) 10 MEQ tablet Take 10 mEq by mouth 2 (two) times a week. Tuesday and friday       Allergies  Allergen Reactions  . Hydrocodone-Acetaminophen     REACTION: GI distress   Follow-up Information   Follow up with Virl Axe, MD In 2 weeks. (hospital follow up)    Specialty:  Cardiology   Contact information:   [address] [place] [address] [phone]        The results of significant diagnostics from this hospitalization (including imaging, microbiology, ancillary and laboratory) are listed below for reference.    Significant Diagnostic Studies: Dg Chest 2 View  11/25/2013   CLINICAL DATA:  Dizziness. Pacemaker. COPD. Hypertension. History of smoking.  EXAM: CHEST  2 VIEW  COMPARISON:  07/23/2012  FINDINGS: Patient's left-sided transvenous pacemaker with leads overlying the right atrium and right ventricle. The heart is mildly enlarged. There is perihilar peribronchial thickening. There are no focal consolidations or pleural effusions. No pulmonary edema. Surgical clips are noted in the right upper quadrant of the abdomen.  IMPRESSION: 1. Cardiomegaly without pulmonary edema. 2. Bronchitic change. 3.  No  focal acute pulmonary abnormality.   Electronically Signed   By: Shon Hale M.D.   On: 11/25/2013 19:58   Ct Head Wo Contrast  11/25/2013   CLINICAL DATA:  Blurry vision and dizziness.  EXAM: CT HEAD WITHOUT CONTRAST  TECHNIQUE: Contiguous axial images were obtained from the base of the skull through the vertex without intravenous contrast.  COMPARISON:  08/17/2011  FINDINGS: Mild global atrophy appropriate to age noted. No acute cortical infarct, hemorrhage, or mass lesion ispresent. Ventricles are of normal size. No significant extra-axial fluid collection is present. The paranasal sinuses andmastoid air cells are clear. The osseous skull is intact.  IMPRESSION: 1. No acute intracranial abnormalities.   Electronically Signed   By: Kerby Moors M.D.   On: 11/25/2013 20:00   Nm Myocar Multi W/spect W/wall Motion / Ef  11/27/2013   CLINICAL DATA:  Chest pain, SOB  EXAM: Lexiscan Myovue  TECHNIQUE: The patient received IV Lexiscan .4mg  over 15 seconds. 33.0 mCi of Technetium 2m Sestamibi injected at 30 seconds. Quantitative SPECT images were obtained in the vertical, horizontal and short axis  planes after a 45 minute delay. Rest images were obtained with similar planes and delay using 10.2 mCi of Technetium 18m Sestamibi.  FINDINGS: ECG:  V -paced rhythm  Symptoms: Chest discomfort post regadenoson infusion that resolved after 50 mg of aminophylline.  RAW Data: Significant extracardiac activity not affecting ability to read.  Quantitiative Gated SPECT EF: Akinesis of all of the apical segments including the true apex.  Perfusion Images: There is a large size severe severity irreversible defect in all of the apical segments, true apex and mid anterolateral wall.  SSS 26, SRS 26, SDS 0  IMPRESSION: 1. This is an intermediate risk scan with a large scar in the mid LAD territory (including the diagonal branch) with no peri-infarct ischemia.  2. Akinesis of the apical segments is well compensated by hyperdynamic mid and basal segments and overall LVEF is preserved (LVEF =60%).  Jamie [name]   Electronically Signed   By: Jamie [name]   On: 11/27/2013 10:21    Microbiology: No results found for this or any previous visit (from the past 240 hour(s)).   Labs: Basic Metabolic Panel:  Recent Labs Lab 11/25/13 1852 11/26/13 0855  NA 138 142  K 4.5 4.4  CL 97 100  CO2 28 27  GLUCOSE 96 99  BUN 17 10  CREATININE 0.77 0.68  CALCIUM 9.5 9.1   Liver Function Tests:  Recent Labs Lab 11/26/13 0855  AST 23  ALT 14  ALKPHOS 77  BILITOT 0.8  PROT 6.2  ALBUMIN 3.5   No results found for this basename: LIPASE, AMYLASE,  in the last 168 hours No results found for this basename: AMMONIA,  in the last 168 hours CBC:  Recent Labs Lab 11/25/13 1852 11/26/13 0855 11/27/13 0720 11/28/13 0406  WBC 6.9 6.1 6.1 6.5  HGB 14.1 13.7 14.2 13.5  HCT 42.6 40.9 42.5 39.9  MCV 96.2 94.2 94.9 93.9  PLT 189 186 164 172   Cardiac Enzymes:  Recent Labs Lab 11/26/13 0855  CKTOTAL 84  CKMB 4.1*  TROPONINI 0.46*   BNP: BNP (last 3 results) No results found for this basename: PROBNP,   in the last 8760 hours CBG:  Recent Labs Lab 11/25/13 1829  GLUCAP 85       Signed:  FELIZ ORTIZ, Syair Fricker  [name] 11/28/2013, 10:52 AM

## 2013-11-28 NOTE — Progress Notes (Signed)
Patient c/o of some nausea and belching, but states she is chest pain free. Prn zofran given and vitals taken

## 2013-11-28 NOTE — Progress Notes (Signed)
Patient Name: Jamie [name] Date of Encounter: 11/28/2013     Principal Problem:   Elevated troponin Active Problems:   HYPERTENSION, UNSPECIFIED   COPD (chronic obstructive pulmonary disease) with emphysema   Pacemaker-Medtronic   Dizziness    SUBJECTIVE Jamie [name] denies chest pain or shortness of breath overnight. Describes two episodes of stomach pain, dizziness, nausea and diaphoresis last PM and this AM. Denies vomiting. Nurse gave zofran this AM and she felt better. Daughter states that during the episode last night, she seemed anxious and had a decrease in her normal mentation.   CURRENT MEDS . aspirin EC  81 mg Oral Daily  . atorvastatin  40 mg Oral q1800  . enoxaparin (LOVENOX) injection  40 mg Subcutaneous Q24H  . furosemide  20 mg Oral Daily  . lisinopril  5 mg Oral Daily  . loratadine  10 mg Oral Daily  . metoprolol  25 mg Oral BID  . sodium chloride  3 mL Intravenous Q12H    OBJECTIVE  Filed Vitals:   11/27/13 2100 11/28/13 0009 11/28/13 0343 11/28/13 0700  BP: 111/67 107/46 124/49 130/52  Pulse: 68 63 72 80  Temp:  98.4 F (36.9 C) 98.3 F (36.8 C)   TempSrc:  Oral Oral   Resp:  18 18   Height:      Weight:   119 lb 11.2 oz (54.296 kg)   SpO2:  92% 93% 99%   No intake or output data in the 24 hours ending 11/28/13 0754 Filed Weights   11/26/13 0223 11/27/13 0609 11/28/13 0343  Weight: 132 lb 6.4 oz (60.056 kg) 136 lb (61.689 kg) 119 lb 11.2 oz (54.296 kg)    PHYSICAL EXAM  General: Pleasant, NAD.  Neuro: Alert and oriented X 3. Moves all extremities spontaneously. Psych: Normal affect. HEENT:  Normal  Neck: Supple without bruits or JVD. Lungs:  Resp regular and unlabored, distant, CTA. Heart: RRR no s3, s4, or murmurs. Abdomen: Soft, non-tender, non-distended, BS + x 4.  Extremities: No clubbing, cyanosis or edema. DP/PT/Radials 1+ and equal bilaterally.  Accessory Clinical Findings  CBC  Recent Labs  11/27/13 0720 11/28/13 0406    WBC 6.1 6.5  HGB 14.2 13.5  HCT 42.5 39.9  MCV 94.9 93.9  PLT 164 355   Basic Metabolic Panel  Recent Labs  11/25/13 1852 11/26/13 0855  NA 138 142  K 4.5 4.4  CL 97 100  CO2 28 27  GLUCOSE 96 99  BUN 17 10  CREATININE 0.77 0.68  CALCIUM 9.5 9.1   Liver Function Tests  Recent Labs  11/26/13 0855  AST 23  ALT 14  ALKPHOS 77  BILITOT 0.8  PROT 6.2  ALBUMIN 3.5    Recent Labs  11/26/13 0855  CKTOTAL 84  CKMB 4.1*  TROPONINI 0.46*   Hemoglobin A1C  Recent Labs  11/26/13 0855  HGBA1C 5.9*   Fasting Lipid Panel  Recent Labs  11/27/13 0750  CHOL 191  HDL 78  LDLCALC 102*  TRIG 53  CHOLHDL 2.4   Thyroid Function Tests  Recent Labs  11/26/13 2159  TSH 2.080    TELE: ventricular paced rhythm   Radiology/Studies  Dg Chest 2 View 11/25/2013   CLINICAL DATA:  Dizziness. Pacemaker. COPD. Hypertension. History of smoking.  EXAM: CHEST  2 VIEW  COMPARISON:  07/23/2012  FINDINGS: Patient's left-sided transvenous pacemaker with leads overlying the right atrium and right ventricle. The heart is mildly enlarged. There is perihilar peribronchial thickening.  There are no focal consolidations or pleural effusions. No pulmonary edema. Surgical clips are noted in the right upper quadrant of the abdomen.  IMPRESSION: 1. Cardiomegaly without pulmonary edema. 2. Bronchitic change. 3.  No focal acute pulmonary abnormality.   Electronically Signed   By: Shon Hale M.D.   On: 11/25/2013 19:58   Ct Head Wo Contrast 11/25/2013   CLINICAL DATA:  Blurry vision and dizziness.  EXAM: CT HEAD WITHOUT CONTRAST  TECHNIQUE: Contiguous axial images were obtained from the base of the skull through the vertex without intravenous contrast.  COMPARISON:  08/17/2011  FINDINGS: Mild global atrophy appropriate to age noted. No acute cortical infarct, hemorrhage, or mass lesion ispresent. Ventricles are of normal size. No significant extra-axial fluid collection is present. The paranasal  sinuses andmastoid air cells are clear. The osseous skull is intact.  IMPRESSION: 1. No acute intracranial abnormalities.   Electronically Signed   By: Kerby Moors M.D.   On: 11/25/2013 20:00   Echo: 11/26/13 Study Conclusions - Left ventricle: The cavity size was normal. Wall thickness was normal. Systolic function was mildly to moderately reduced. The estimated ejection fraction was in the range of 40% to 45%. There is mid to distal anterior and apical and inferoapical hypokinesis, suggesting LAD territory ischemia/infarct. Paced septal dyskinesis. Doppler parameters are consistent with abnormal left ventricular relaxation (grade 1 diastolic dysfunction). The E/e&' ratio is between 8-15, suggesting indeterminate LV filling pressure. - Mitral valve: Calcified annulus. There was trivial regurgitation. - Left atrium: The atrium was normal in size. - Right ventricle: The cavity size was normal. Wall thickness was normal. Pacer wire or catheter noted in right ventricle. - Right atrium: The atrium was at the upper limits of normal in size. Pacer wire or catheter noted in right atrium. - Tricuspid valve: There was mild regurgitation. - Pulmonary arteries: PA peak pressure: 23 mm Hg (S). Impressions: - Compared to the echo in 07/2012, there is now a reduced EF to 40-45% with an LAD territory wall motion abnormality.  Nm Myocar Multi W/spect W/wall Motion / Ef 11/27/2013   CLINICAL DATA:  Chest pain, SOB  EXAM: Lexiscan Myovue  TECHNIQUE: The patient received IV Lexiscan .4mg  over 15 seconds. 33.0 mCi of Technetium 42m Sestamibi injected at 30 seconds. Quantitative SPECT images were obtained in the vertical, horizontal and short axis planes after a 45 minute delay. Rest images were obtained with similar planes and delay using 10.2 mCi of Technetium 83m Sestamibi.  FINDINGS: ECG:  V -paced rhythm  Symptoms: Chest discomfort post regadenoson infusion that resolved after 50 mg of aminophylline.  RAW  Data: Significant extracardiac activity not affecting ability to read.  Quantitiative Gated SPECT EF: Akinesis of all of the apical segments including the true apex.  Perfusion Images: There is a large size severe severity irreversible defect in all of the apical segments, true apex and mid anterolateral wall.  SSS 26, SRS 26, SDS 0  IMPRESSION: 1. This is an intermediate risk scan with a large scar in the mid LAD territory (including the diagonal branch) with no peri-infarct ischemia.  2. Akinesis of the apical segments is well compensated by hyperdynamic mid and basal segments and overall LVEF is preserved (LVEF =60%).  [name]   Electronically Signed   By: [name]   On: 11/27/2013 10:21    ASSESSMENT AND PLAN  Elevated troponin  - Myoview stress test reveals a large scar in the mid LAD territory, she likely had the infarct a  few wks ago (pt noted indigestion and back pain).  - cont ASA, ACE, BB  - Lipid profile reveals LDL of 102- Lipitor 40 mg added  Active Problems:  Newly diagnosed systolic CHF  - EF 65-53% on echo; 60% on myoview  - Added ACE yesterday  - Lasix 20mg  is patient's home dose for diuretics- discharge on Lasix 20mg  per MD. Patient seems reliable to continue daily weights and monitor fluid status at home.    Visual disturbance/ dizziness per IM - one episode- possibly TIA- normal CT head- MRI cannot be done due to pacemaker  - carotid duplex normal despite right carotid bruit on exam   HYPERTENSION, UNSPECIFIED- on ACE and BB. BP has been soft with diastolic in the 74M while in the hospital. Do not think that patient would tolerate a increase in daily lasix, especially in the setting of the newly added ACEi. MD to see.   COPD (chronic obstructive pulmonary disease) with emphysema  - has been stable on 2 L O2 per IM  Pacemaker-Medtronic    Signed, Lucy Antigua, PA-S2   Eileen Stanford PA-C  Pager [phone]  I have seen and examined the patient  along with Angelena Form R PA-C.  I have reviewed the chart, notes and new data.  I agree with PA's note.  Key new complaints: dyspnea at baseline Key examination changes: no clinical CHF Key new findings / data: no new arrhythmia  PLAN: DC home. Titrate ACEi if BP allows as an outpatient. F/U with Dr. Caryl Comes in short order.  Sanda Klein, MD, [place] [phone] 11/28/2013, 10:29 AM

## 2013-12-05 DIAGNOSIS — H353290 Exudative age-related macular degeneration, unspecified eye, stage unspecified: Secondary | ICD-10-CM | POA: Diagnosis not present

## 2013-12-05 DIAGNOSIS — H35059 Retinal neovascularization, unspecified, unspecified eye: Secondary | ICD-10-CM | POA: Diagnosis not present

## 2013-12-12 ENCOUNTER — Other Ambulatory Visit: Payer: Self-pay | Admitting: *Deleted

## 2013-12-12 MED ORDER — METOPROLOL TARTRATE 50 MG PO TABS
25.0000 mg | ORAL_TABLET | Freq: Two times a day (BID) | ORAL | Status: DC
Start: 2013-12-12 — End: 2014-02-23

## 2013-12-13 ENCOUNTER — Encounter: Payer: Self-pay | Admitting: Internal Medicine

## 2013-12-13 ENCOUNTER — Encounter: Payer: Self-pay | Admitting: Physician Assistant

## 2013-12-13 ENCOUNTER — Ambulatory Visit (INDEPENDENT_AMBULATORY_CARE_PROVIDER_SITE_OTHER): Payer: Medicare Other | Admitting: Physician Assistant

## 2013-12-13 VITALS — BP 150/70 | HR 70 | Ht 63.0 in | Wt 122.8 lb

## 2013-12-13 DIAGNOSIS — I5042 Chronic combined systolic (congestive) and diastolic (congestive) heart failure: Secondary | ICD-10-CM

## 2013-12-13 DIAGNOSIS — Z950 Presence of cardiac pacemaker: Secondary | ICD-10-CM | POA: Diagnosis not present

## 2013-12-13 DIAGNOSIS — I2510 Atherosclerotic heart disease of native coronary artery without angina pectoris: Secondary | ICD-10-CM | POA: Diagnosis not present

## 2013-12-13 DIAGNOSIS — R072 Precordial pain: Secondary | ICD-10-CM

## 2013-12-13 DIAGNOSIS — E785 Hyperlipidemia, unspecified: Secondary | ICD-10-CM

## 2013-12-13 DIAGNOSIS — I255 Ischemic cardiomyopathy: Secondary | ICD-10-CM

## 2013-12-13 DIAGNOSIS — I442 Atrioventricular block, complete: Secondary | ICD-10-CM | POA: Diagnosis not present

## 2013-12-13 DIAGNOSIS — I10 Essential (primary) hypertension: Secondary | ICD-10-CM | POA: Diagnosis not present

## 2013-12-13 DIAGNOSIS — J439 Emphysema, unspecified: Secondary | ICD-10-CM

## 2013-12-13 LAB — BASIC METABOLIC PANEL
BUN: 18 mg/dL (ref 6–23)
CO2: 32 mEq/L (ref 19–32)
CREATININE: 1 mg/dL (ref 0.4–1.2)
Calcium: 9 mg/dL (ref 8.4–10.5)
Chloride: 98 mEq/L (ref 96–112)
GFR: 52.95 mL/min — AB (ref >60.00)
GLUCOSE: 100 mg/dL — AB (ref 70–99)
Potassium: 4.7 mEq/L (ref 3.5–5.1)
Sodium: 135 mEq/L (ref 135–145)

## 2013-12-13 MED ORDER — LOSARTAN POTASSIUM 25 MG PO TABS: 25.0000 mg | ORAL_TABLET | Freq: Every day | ORAL | Status: DC

## 2013-12-13 MED ORDER — ISOSORBIDE MONONITRATE ER 30 MG PO TB24: 15.0000 mg | ORAL_TABLET | Freq: Every day | ORAL | Status: DC

## 2013-12-13 NOTE — Progress Notes (Signed)
Cardiology Office Note    Date:  12/13/2013   ID:  Jamie [name], DOB [birth year], MRN [id number]  PCP:  Jerlyn Ly, MD  Cardiologist:  Dr. Virl Axe     History of Present Illness: Jamie [name] is a 80 y.o. female with a hx of CHB s/p PPM, HTN, COPD, GERD, diastolic CHF.  She was recently admitted to the hospital 9/13-9/16 with elevated troponins after presenting with complaints of dizziness and visual disturbance.  Symptoms were not felt to be consistent with ACS. Dizziness and visual changes were suspicious for possible TIA. CT was negative. Carotid Dopplers demonstrated no significant ICA stenosis (bilateral 1-39%).  Peak troponin was 0.46.  Echocardiogram demonstrated worsening LV function with an EF of 40-45% with wall motion abnormalities in the LAD territory.  Findings appeared to be consistent with a completed LAD infarction. Patient noted hx of indigestion about 3 weeks prior to admission with associated back pain.  Invasive versus noninvasive evaluation was discussed. Patient preferred to avoid invasive testing given advanced age. Inpatient Myoview was obtained.  Myoview study demonstrated large scar in the mid LAD territory but no ischemia.  No further workup was planned. CHF medications were adjusted. She returns for follow up.     Since DC, she notes a dry cough.  Also notes hoarseness.  She suspects it is related to the Crestor.  Lisinopril is also new for her.  She brings in the package inserts for all of her medications and has many questions about potential side effects and cautions with these drugs.  Patient does note an episode of chest discomfort described as tightness yesterday that lasted about 5-10 minutes. She had associated dyspnea and diaphoresis as well as a hot feeling radiating to her bilateral arms. She denies syncope. She did take nitroglycerin x2 with relief of her symptoms. Afterward she had dizziness and visual changes similar to what she had prior to  presentation to the hospital. Otherwise, she denies exertional chest symptoms. She denies orthopnea, PND or edema.  Studies:  - LHC (4/12):  Mid LAD 20%, prox to mid RCA 30%, EF 50-55%  - Echo (9/15):  EF 40-45%, ant and apical and inferoapical HK, Gr 1 DD, trivial MR, mild TR, PASP 23 mmHg  - Carotid US (9/15):  Bilateral ICA 1-39%   Recent Labs/Images:   Recent Labs  11/26/13 0855 11/26/13 2159  11/27/13 0750 11/28/13 0406  NA 142  --   --   --   --   K 4.4  --   --   --   --   BUN 10  --   --   --   --   CREATININE 0.68  --   --   --   --   ALT 14  --   --   --   --   HGB 13.7  --   < >  --  13.5  TSH  --  2.080  --   --   --   LDLCALC  --   --   --  102*  --   HDL  --   --   --  78  --   < > = values in this interval not displayed.    Nm Myocar Multi W/spect W/wall Motion / Ef  11/27/2013     IMPRESSION: 1. This is an intermediate risk scan with a large scar in the mid LAD territory (including the diagonal branch) with no peri-infarct ischemia.  2. Akinesis of the apical segments is well compensated by hyperdynamic mid and basal segments and overall LVEF is preserved (LVEF =60%).  [name]   Electronically Signed   By: [name]   On: 11/27/2013 10:21    Wt Readings from Last 3 Encounters:  12/13/13 122 lb 12.8 oz (55.702 kg)  11/28/13 119 lb 11.2 oz (54.296 kg)  02/06/13 124 lb 12.8 oz (56.609 kg)     Past Medical History  Diagnosis Date  . Complete AV block   . Presence of permanent cardiac pacemaker   . Tachycardia   . Hypertension   . Osteopenia   . Osteoarthritis   . COPD (chronic obstructive pulmonary disease)   . Pacemaker   . Shortness of breath   . Pneumonia   . Asthma   . GERD (gastroesophageal reflux disease)   . Family history of anesthesia complication     SISTER HAS DIFFICULTY WAKING  . UTI (urinary tract infection)   . Chronic combined systolic and diastolic CHF (congestive heart failure)   . Ischemic cardiomyopathy     a.  Echo  (9/15):  EF 40-45%, ant and apical and inferoapical HK, Gr 1 DD, trivial MR, mild TR, PASP 23 mmHg;  b. Myoview (9/15) large scar in mid LAD territory, no peri-infarct ischemia, EF 60%, apical AK; intermediate risk >> patient opted for med Rx  . Carotid stenosis     a. Carotid US (9/15):  Bilateral ICA 1-39%    Current Outpatient Prescriptions  Medication Sig Dispense Refill  . albuterol (PROAIR HFA) 108 (90 BASE) MCG/ACT inhaler Inhale 2 puffs into the lungs every 6 (six) hours as needed. For shortness of breath/wheezing      . ALPRAZolam (XANAX) 0.5 MG tablet Take 0.5 mg by mouth at bedtime as needed. For sleep      . aspirin EC 81 MG tablet Take 81 mg by mouth 3 (three) times a week.      . Calcium Carbonate (CALTRATE 600) 1500 MG TABS Take 600 mg by mouth daily.       . cholecalciferol (VITAMIN D) 1000 UNITS tablet Take 1,000 Units by mouth daily.        . Coenzyme Q10 (CO Q 10 PO) Take 1 capsule by mouth daily.      . Cyanocobalamin (VITAMIN B 12 PO) Take 1 tablet by mouth daily.      . furosemide (LASIX) 20 MG tablet Take 1 tablet (20 mg total) by mouth daily.  30 tablet  2  . levalbuterol (XOPENEX) 0.63 MG/3ML nebulizer solution Take 0.63 mg by nebulization every 6 (six) hours as needed for shortness of breath.      . lisinopril (PRINIVIL,ZESTRIL) 5 MG tablet Take 1 tablet (5 mg total) by mouth daily.  30 tablet  0  . loratadine (CLARITIN) 10 MG tablet Take 10 mg by mouth daily.      . Magnesium 250 MG TABS Take 250 mg by mouth daily.       . metoprolol (LOPRESSOR) 50 MG tablet Take 0.5 tablets (25 mg total) by mouth 2 (two) times daily.  30 tablet  1  . Misc Natural Products (OSTEO BI-FLEX ADV JOINT SHIELD PO) Take 2 capsules by mouth daily.        . Multiple Vitamins-Minerals (EYE VITAMINS PO) Take by mouth daily.        . nitroGLYCERIN (NITROSTAT) 0.4 MG SL tablet Place 1 tablet (0.4 mg total) under the tongue every 5 (five) minutes  as needed for chest pain.  15 tablet  12  . Omega-3  Fatty Acids (FISH OIL) 1200 MG CAPS Take 1,200 mg by mouth daily.       Vladimir Faster Glycol-Propyl Glycol (SYSTANE) 0.4-0.3 % SOLN Place 1 drop into both eyes 2 (two) times daily as needed (dry eyes).      . potassium chloride (K-DUR) 10 MEQ tablet Take 10 mEq by mouth 2 (two) times a week. Tuesday and friday      . rosuvastatin (CRESTOR) 10 MG tablet Take 1 tablet (10 mg total) by mouth daily.  30 tablet  0   No current facility-administered medications for this visit.     Allergies:   Hydrocodone-acetaminophen   Social History:  The patient  reports that she quit smoking about 26 years ago. Her smoking use included Cigarettes. She has a 35 pack-year smoking history. She has never used smokeless tobacco. She reports that she does not drink alcohol or use illicit drugs.   Family History:  The patient's family history includes Breast cancer in her sister; Heart disease in her sister.   ROS:  Please see the history of present illness.       All other systems reviewed and negative.    PHYSICAL EXAM: VS:  BP 150/70  Pulse 70  Ht 5\' 3"  (1.6 m)  Wt 122 lb 12.8 oz (55.702 kg)  BMI 21.76 kg/m2 Well nourished, well developed, in no acute distress HEENT: normal Neck:   no JVD Cardiac:  normal S1, S2;  RRR; no murmur Lungs:  Decreased breath sounds bilaterally, no wheezing, rhonchi or rales Abd: soft, nontender, no hepatomegaly Ext:  no edema Skin: warm and dry Neuro:  CNs 2-12 intact, no focal abnormalities noted  EKG:  NSR, HR 70, ventricular paced      ASSESSMENT AND PLAN:  1.  Chest Pain:  Patient notes recurrent symptoms of chest discomfort. This was relieved with nitroglycerin. Her symptoms certainly sound consistent with angina. She denies exertional symptoms. We again discussed medical versus invasive therapy. She would like to continue medical therapy only. She has had some side effects to lisinopril with nonproductive cough. She is also concerned that Crestor is causing  hoarseness.   -  Add Imdur 15 mg QD.   -  DC Lisinopril   -  Start Losartan 25 mg QD.  Check BMET today and repeat in 2 weeks.    -  Hold Crestor for 2 weeks then resume.   -  Early FU with me.  If she has continued symptoms, will d/w Dr.Klein whether or not to pursue LHC. 2.  CAD:  Continue aspirin, beta blocker, statin. Add long-acting nitrates as outlined above.  Consider proceeding with cardiac catheterization if patient fails medical therapy. 3.  Chronic Combined Systolic and Diastolic CHF:  Volume appears stable. Continue current dose of Lasix. Check a basic metabolic panel today. 4.  Ischemic CM:  Presumed by findings on echocardiogram and nuclear study. Continue beta blocker. Change ACE inhibitor to ARB. Consider adding spironolactone in follow up. 5.  CHB s/p PPM:  Follow up with the EP as planned 6.  HTN:  Borderline control. Adjust medications as noted. 7.  Hyperlipidemia:  Hold Crestor for 2 weeks, then resume. If it is clear that Crestor has caused hoarseness, consider changing to a different statin. 8.  COPD:  She is on continuous O2. Question of COPD may be contributing to some of her symptoms. 9.  Dizziness: Etiology of her dizziness  prior to admission is not clear. Recent episode of dizziness in the setting of nitroglycerin use was likely related to hypotension.  Disposition:    FU with me in 2-3 weeks.    Signed, Versie Starks, MHS 12/13/2013 2:33 PM    [facility] [address], [place], [place]  [address] Phone: [phone]; Fax: [phone]

## 2013-12-13 NOTE — Patient Instructions (Signed)
LAB WORK TODAY; BMET  REPEAT BMET IN 2 WEEKS  Your physician has recommended you make the following change in your medication:  1. STOP LISINOPRIL 2. HOLD CRESTOR FOR 2 WEEKS THEN RESUME 3. START LOSARTAN 25 MG DAILY; NEW RX SENT IN TODAY 4. START IMDUR 30 MG TABLET YOU WILL TAKE 1/2 TABLET DAILY = 15 MG DAILY'; NEW RX SENT IN TODAY  Your physician recommends that you schedule a follow-up appointment in: 2-3 [place] IS IN THE OFFICE

## 2013-12-17 ENCOUNTER — Telehealth: Payer: Self-pay | Admitting: *Deleted

## 2013-12-17 NOTE — Telephone Encounter (Signed)
pt notified about lab results with verbal understanding  

## 2014-01-02 ENCOUNTER — Encounter: Payer: Self-pay | Admitting: Physician Assistant

## 2014-01-02 ENCOUNTER — Ambulatory Visit (INDEPENDENT_AMBULATORY_CARE_PROVIDER_SITE_OTHER): Payer: Medicare Other | Admitting: Physician Assistant

## 2014-01-02 VITALS — BP 160/72 | HR 68 | Ht 63.0 in | Wt 122.1 lb

## 2014-01-02 DIAGNOSIS — J439 Emphysema, unspecified: Secondary | ICD-10-CM

## 2014-01-02 DIAGNOSIS — I10 Essential (primary) hypertension: Secondary | ICD-10-CM | POA: Diagnosis not present

## 2014-01-02 DIAGNOSIS — E785 Hyperlipidemia, unspecified: Secondary | ICD-10-CM

## 2014-01-02 DIAGNOSIS — I2510 Atherosclerotic heart disease of native coronary artery without angina pectoris: Secondary | ICD-10-CM | POA: Diagnosis not present

## 2014-01-02 DIAGNOSIS — I5042 Chronic combined systolic (congestive) and diastolic (congestive) heart failure: Secondary | ICD-10-CM

## 2014-01-02 DIAGNOSIS — R072 Precordial pain: Secondary | ICD-10-CM | POA: Diagnosis not present

## 2014-01-02 DIAGNOSIS — I255 Ischemic cardiomyopathy: Secondary | ICD-10-CM | POA: Diagnosis not present

## 2014-01-02 MED ORDER — ROSUVASTATIN CALCIUM 10 MG PO TABS: ORAL_TABLET | ORAL | Status: DC

## 2014-01-02 NOTE — Patient Instructions (Addendum)
LAB WORK TODAY; BMET  CRESTOR 10 MG 1 TABLET WEEKLY AT BEDTIME  FOLLOW UP WITH DR. Caryl Comes IN 02/2014

## 2014-01-02 NOTE — Progress Notes (Signed)
Cardiology Office Note   Date:  01/02/2014   ID:  Jamie [name], DOB 1922/12/02, MRN [id number]  PCP:  Jerlyn Ly, MD  Cardiologist:  Dr. Virl Axe     History of Present Illness: Jamie [name] is a 80 y.o. female with a hx of CHB s/p PPM, HTN, COPD, GERD, diastolic CHF.    Admitted 11/2013 with elevated troponins after presenting with complaints of dizziness and visual disturbance.  Symptoms were not felt to be consistent with ACS. Dizziness and visual changes were suspicious for possible TIA. CT was negative. Carotid Dopplers demonstrated no significant ICA stenosis (bilateral 1-39%).  Peak troponin was 0.46.  Echocardiogram demonstrated worsening LV function with an EF of 40-45% with wall motion abnormalities in the LAD territory.  Findings appeared to be consistent with a completed LAD infarction. Patient noted hx of indigestion about 3 weeks prior to admission with associated back pain.  Invasive versus noninvasive evaluation was discussed. Patient preferred to avoid invasive testing given advanced age. Inpatient Myoview was obtained.  Myoview study demonstrated large scar in the mid LAD territory but no ischemia.  No further workup was planned. CHF medications were adjusted.   Seen in FU 12/13/13 by me.  She had c/o's of NTG responsive CP.  She also complained of a cough.  I placed her on Imdur and changed her ACEI to an ARB.  I asked her to hold Crestor for 2 weeks as she felt like this was causing hoarseness.  She returns for FU.  She is feeling much better.  She notes occasional chest tightness associated with dyspnea.  This resolves with Neb treatment.  She has a different chest discomfort she describes as a "hot" feeling.  This seems to be her anginal equivalent.  She has not had a recurrence since starting the Imdur.  She denies syncope, orthopnea, PND, edema.     Studies:  - LHC (4/12):  Mid LAD 20%, prox to mid RCA 30%, EF 50-55%  - Echo (9/15):  EF 40-45%, ant and apical and  inferoapical HK, Gr 1 DD, trivial MR, mild TR, PASP 23 mmHg  - Carotid US (9/15):  Bilateral ICA 1-39%   Recent Labs/Images:  Recent Labs  11/26/13 0855 11/26/13 2159  11/27/13 0750 11/28/13 0406 12/13/13 1536  NA 142  --   --   --   --  135  K 4.4  --   --   --   --  4.7  BUN 10  --   --   --   --  18  CREATININE 0.68  --   --   --   --  1.0  ALT 14  --   --   --   --   --   HGB 13.7  --   < >  --  13.5  --   TSH  --  2.080  --   --   --   --   LDLCALC  --   --   --  102*  --   --   HDL  --   --   --  78  --   --   < > = values in this interval not displayed.    Nm Myocar Multi W/spect W/wall Motion / Ef  11/27/2013     IMPRESSION: 1. This is an intermediate risk scan with a large scar in the mid LAD territory (including the diagonal branch) with no peri-infarct ischemia.  2. Akinesis  of the apical segments is well compensated by hyperdynamic mid and basal segments and overall LVEF is preserved (LVEF =60%).  Jamie [name]   Electronically Signed   By: Jamie [name]   On: 11/27/2013 10:21    Wt Readings from Last 3 Encounters:  12/13/13 122 lb 12.8 oz (55.702 kg)  11/28/13 119 lb 11.2 oz (54.296 kg)  02/06/13 124 lb 12.8 oz (56.609 kg)     Past Medical History  Diagnosis Date  . Complete AV block   . Presence of permanent cardiac pacemaker   . Tachycardia   . Hypertension   . Osteopenia   . Osteoarthritis   . COPD (chronic obstructive pulmonary disease)   . Pacemaker   . Shortness of breath   . Pneumonia   . Asthma   . GERD (gastroesophageal reflux disease)   . Family history of anesthesia complication     SISTER HAS DIFFICULTY WAKING  . UTI (urinary tract infection)   . Chronic combined systolic and diastolic CHF (congestive heart failure)   . Ischemic cardiomyopathy     a.  Echo (9/15):  EF 40-45%, ant and apical and inferoapical HK, Gr 1 DD, trivial MR, mild TR, PASP 23 mmHg;  b. Myoview (9/15) large scar in mid LAD territory, no peri-infarct ischemia, EF  60%, apical AK; intermediate risk >> patient opted for med Rx  . Carotid stenosis     a. Carotid US (9/15):  Bilateral ICA 1-39%    Current Outpatient Prescriptions  Medication Sig Dispense Refill  . albuterol (PROAIR HFA) 108 (90 BASE) MCG/ACT inhaler Inhale 2 puffs into the lungs every 6 (six) hours as needed. For shortness of breath/wheezing      . ALPRAZolam (XANAX) 0.5 MG tablet Take 0.5 mg by mouth at bedtime as needed. For sleep      . aspirin EC 81 MG tablet Take 81 mg by mouth 3 (three) times a week.      . Calcium Carbonate (CALTRATE 600) 1500 MG TABS Take 600 mg by mouth daily.       . cholecalciferol (VITAMIN D) 1000 UNITS tablet Take 1,000 Units by mouth daily.        . Coenzyme Q10 (CO Q 10 PO) Take 1 capsule by mouth daily.      . Cyanocobalamin (VITAMIN B 12 PO) Take 1 tablet by mouth daily.      . furosemide (LASIX) 20 MG tablet Take 1 tablet (20 mg total) by mouth daily.  30 tablet  2  . isosorbide mononitrate (IMDUR) 30 MG 24 hr tablet Take 0.5 tablets (15 mg total) by mouth daily.  30 tablet  11  . levalbuterol (XOPENEX) 0.63 MG/3ML nebulizer solution Take 0.63 mg by nebulization every 6 (six) hours as needed for shortness of breath.      . loratadine (CLARITIN) 10 MG tablet Take 10 mg by mouth daily.      Marland Kitchen losartan (COZAAR) 25 MG tablet Take 1 tablet (25 mg total) by mouth daily.  30 tablet  11  . Magnesium 250 MG TABS Take 250 mg by mouth daily.       . metoprolol (LOPRESSOR) 50 MG tablet Take 0.5 tablets (25 mg total) by mouth 2 (two) times daily.  30 tablet  1  . Misc Natural Products (OSTEO BI-FLEX ADV JOINT SHIELD PO) Take 2 capsules by mouth daily.        . Multiple Vitamins-Minerals (EYE VITAMINS PO) Take by mouth daily.        Marland Kitchen  nitroGLYCERIN (NITROSTAT) 0.4 MG SL tablet Place 1 tablet (0.4 mg total) under the tongue every 5 (five) minutes as needed for chest pain.  15 tablet  12  . Omega-3 Fatty Acids (FISH OIL) 1200 MG CAPS Take 1,200 mg by mouth daily.         Vladimir Faster Glycol-Propyl Glycol (SYSTANE) 0.4-0.3 % SOLN Place 1 drop into both eyes 2 (two) times daily as needed (dry eyes).      . potassium chloride (K-DUR) 10 MEQ tablet Take 10 mEq by mouth 2 (two) times a week. Tuesday and friday      . rosuvastatin (CRESTOR) 10 MG tablet Take 1 tablet (10 mg total) by mouth daily.  30 tablet  0   No current facility-administered medications for this visit.     Allergies:   Ace inhibitors and Hydrocodone-acetaminophen   Social History:  The patient  reports that she quit smoking about 26 years ago. Her smoking use included Cigarettes. She has a 35 pack-year smoking history. She has never used smokeless tobacco. She reports that she does not drink alcohol or use illicit drugs.   Family History:  The patient's family history includes Breast cancer in her sister; Heart disease in her sister.   ROS:  Please see the history of present illness.  Denies a cough.    All other systems reviewed and negative.    PHYSICAL EXAM: VS:  BP 160/72  Pulse 68  Ht 5\' 3"  (1.6 m)  Wt 122 lb 1.9 oz (55.393 kg)  BMI 21.64 kg/m2 Well nourished, well developed, in no acute distress HEENT: normal Neck:   no JVD Cardiac:  normal S1, S2;  RRR; no murmur Lungs:  Decreased breath sounds bilaterally, no wheezing, rhonchi or rales Abd: soft, nontender, no hepatomegaly Ext:  no edema Skin: warm and dry Neuro:  CNs 2-12 intact, no focal abnormalities noted  EKG:  V paced HR 68  ASSESSMENT AND PLAN:  1.  Chest Pain:   Current symptoms appear to be related to COPD only.  Her angina appears to be controlled on current Rx.  2.  CAD:   Continue aspirin, beta blocker, statin, nitrates.    3.  Chronic Combined Systolic and Diastolic CHF:   Volume appears stable.  4.  Ischemic CM:  Presumed by findings on echocardiogram and nuclear study. Continue beta blocker, ARB.  Repeat BMET today.  5.  CHB s/p PPM:  Follow up with the EP as planned 6.  HTN:  BP goes up in clinic.  Her  BP at home is 120-130/80. 7.  Hyperlipidemia:  She feels like her mentation is normal off of the Crestor.  We discussed the rationale for statin Rx in patients with CAD. She is willing to try Crestor 1 x a week.  She plans to stop this if she has recurrent SEs.   8.  COPD:  She is on continuous O2.     Disposition:    FU with Dr. Virl Axe as planned.    Signed, Versie Starks, MHS 01/02/2014 3:57 PM    [facility] [place], [place], [place]  [address] Phone: [phone]; Fax: [phone]

## 2014-01-03 ENCOUNTER — Telehealth: Payer: Self-pay | Admitting: *Deleted

## 2014-01-03 LAB — BASIC METABOLIC PANEL
BUN: 19 mg/dL (ref 6–23)
CO2: 31 meq/L (ref 19–32)
Calcium: 9.3 mg/dL (ref 8.4–10.5)
Chloride: 97 mEq/L (ref 96–112)
Creatinine, Ser: 1.1 mg/dL (ref 0.4–1.2)
GFR: 51.8 mL/min — AB (ref >60.00)
Glucose, Bld: 90 mg/dL (ref 70–99)
Potassium: 4.2 mEq/L (ref 3.5–5.1)
SODIUM: 137 meq/L (ref 135–145)

## 2014-01-03 NOTE — Telephone Encounter (Signed)
lmptcb for lab results 

## 2014-01-04 ENCOUNTER — Telehealth: Payer: Self-pay | Admitting: Physician Assistant

## 2014-01-04 ENCOUNTER — Telehealth: Payer: Self-pay | Admitting: *Deleted

## 2014-01-04 NOTE — Telephone Encounter (Signed)
Patient informed of BMet/ all labs normal

## 2014-01-04 NOTE — Telephone Encounter (Signed)
New message ° ° ° ° ° °Returning a nurses call °

## 2014-01-04 NOTE — Telephone Encounter (Signed)
pt states she called this morning and s/w nurse about her resuslts. I apologized that I  was not sent the message. Pt said ok and thank you.

## 2014-01-09 DIAGNOSIS — H35351 Cystoid macular degeneration, right eye: Secondary | ICD-10-CM | POA: Diagnosis not present

## 2014-01-09 DIAGNOSIS — H3532 Exudative age-related macular degeneration: Secondary | ICD-10-CM | POA: Diagnosis not present

## 2014-01-09 DIAGNOSIS — H35051 Retinal neovascularization, unspecified, right eye: Secondary | ICD-10-CM | POA: Diagnosis not present

## 2014-01-22 ENCOUNTER — Observation Stay (HOSPITAL_COMMUNITY)
Admission: EM | Admit: 2014-01-22 | Discharge: 2014-01-23 | Disposition: A | Discharge status: Home / Self Care | Payer: Medicare Other | Attending: Internal Medicine | Admitting: Internal Medicine

## 2014-01-22 ENCOUNTER — Emergency Department (HOSPITAL_COMMUNITY): Payer: Medicare Other

## 2014-01-22 ENCOUNTER — Encounter (HOSPITAL_COMMUNITY): Payer: Self-pay | Admitting: *Deleted

## 2014-01-22 DIAGNOSIS — Z79899 Other long term (current) drug therapy: Secondary | ICD-10-CM | POA: Diagnosis not present

## 2014-01-22 DIAGNOSIS — I5042 Chronic combined systolic (congestive) and diastolic (congestive) heart failure: Secondary | ICD-10-CM | POA: Diagnosis not present

## 2014-01-22 DIAGNOSIS — Z87891 Personal history of nicotine dependence: Secondary | ICD-10-CM | POA: Diagnosis not present

## 2014-01-22 DIAGNOSIS — M8580 Other specified disorders of bone density and structure, unspecified site: Secondary | ICD-10-CM | POA: Diagnosis not present

## 2014-01-22 DIAGNOSIS — I252 Old myocardial infarction: Secondary | ICD-10-CM | POA: Diagnosis not present

## 2014-01-22 DIAGNOSIS — K219 Gastro-esophageal reflux disease without esophagitis: Secondary | ICD-10-CM | POA: Diagnosis not present

## 2014-01-22 DIAGNOSIS — R42 Dizziness and giddiness: Secondary | ICD-10-CM | POA: Insufficient documentation

## 2014-01-22 DIAGNOSIS — J449 Chronic obstructive pulmonary disease, unspecified: Secondary | ICD-10-CM | POA: Diagnosis not present

## 2014-01-22 DIAGNOSIS — Z950 Presence of cardiac pacemaker: Secondary | ICD-10-CM | POA: Diagnosis not present

## 2014-01-22 DIAGNOSIS — R0789 Other chest pain: Secondary | ICD-10-CM | POA: Diagnosis not present

## 2014-01-22 DIAGNOSIS — J441 Chronic obstructive pulmonary disease with (acute) exacerbation: Secondary | ICD-10-CM | POA: Insufficient documentation

## 2014-01-22 DIAGNOSIS — I255 Ischemic cardiomyopathy: Secondary | ICD-10-CM | POA: Insufficient documentation

## 2014-01-22 DIAGNOSIS — I209 Angina pectoris, unspecified: Principal | ICD-10-CM | POA: Insufficient documentation

## 2014-01-22 DIAGNOSIS — Z87440 Personal history of urinary (tract) infections: Secondary | ICD-10-CM | POA: Diagnosis not present

## 2014-01-22 DIAGNOSIS — I6529 Occlusion and stenosis of unspecified carotid artery: Secondary | ICD-10-CM | POA: Diagnosis not present

## 2014-01-22 DIAGNOSIS — Z8701 Personal history of pneumonia (recurrent): Secondary | ICD-10-CM | POA: Diagnosis not present

## 2014-01-22 DIAGNOSIS — I442 Atrioventricular block, complete: Secondary | ICD-10-CM | POA: Diagnosis not present

## 2014-01-22 DIAGNOSIS — M549 Dorsalgia, unspecified: Secondary | ICD-10-CM | POA: Insufficient documentation

## 2014-01-22 DIAGNOSIS — M1990 Unspecified osteoarthritis, unspecified site: Secondary | ICD-10-CM | POA: Insufficient documentation

## 2014-01-22 DIAGNOSIS — Z7982 Long term (current) use of aspirin: Secondary | ICD-10-CM | POA: Insufficient documentation

## 2014-01-22 DIAGNOSIS — R072 Precordial pain: Secondary | ICD-10-CM | POA: Diagnosis not present

## 2014-01-22 DIAGNOSIS — I10 Essential (primary) hypertension: Secondary | ICD-10-CM | POA: Diagnosis not present

## 2014-01-22 DIAGNOSIS — R404 Transient alteration of awareness: Secondary | ICD-10-CM | POA: Diagnosis not present

## 2014-01-22 DIAGNOSIS — R079 Chest pain, unspecified: Secondary | ICD-10-CM | POA: Diagnosis not present

## 2014-01-22 DIAGNOSIS — R531 Weakness: Secondary | ICD-10-CM | POA: Diagnosis not present

## 2014-01-22 DIAGNOSIS — J439 Emphysema, unspecified: Secondary | ICD-10-CM | POA: Diagnosis present

## 2014-01-22 LAB — I-STAT TROPONIN, ED: Troponin i, poc: 0.02 ng/mL (ref 0.00–0.08)

## 2014-01-22 LAB — COMPREHENSIVE METABOLIC PANEL
ALK PHOS: 89 U/L (ref 39–117)
ALT: 14 U/L (ref 0–35)
AST: 18 U/L (ref 0–37)
Albumin: 3.3 g/dL — ABNORMAL LOW (ref 3.5–5.2)
Anion gap: 11 (ref 5–15)
BUN: 14 mg/dL (ref 6–23)
CALCIUM: 9 mg/dL (ref 8.4–10.5)
CO2: 30 meq/L (ref 19–32)
Chloride: 98 mEq/L (ref 96–112)
Creatinine, Ser: 0.82 mg/dL (ref 0.50–1.10)
GFR calc Af Amer: 71 mL/min — ABNORMAL LOW (ref >90)
GFR calc non Af Amer: 62 mL/min — ABNORMAL LOW (ref >90)
GLUCOSE: 99 mg/dL (ref 70–99)
Potassium: 4.5 mEq/L (ref 3.7–5.3)
Sodium: 139 mEq/L (ref 137–147)
TOTAL PROTEIN: 6.2 g/dL (ref 6.0–8.3)
Total Bilirubin: 0.3 mg/dL (ref 0.3–1.2)

## 2014-01-22 LAB — CBC
HCT: 38.4 % (ref 36.0–46.0)
HEMOGLOBIN: 12.7 g/dL (ref 12.0–15.0)
MCH: 32.1 pg (ref 26.0–34.0)
MCHC: 33.1 g/dL (ref 30.0–36.0)
MCV: 97 fL (ref 78.0–100.0)
PLATELETS: 185 10*3/uL (ref 150–400)
RBC: 3.96 MIL/uL (ref 3.87–5.11)
RDW: 13.1 % (ref 11.5–15.5)
WBC: 5.8 10*3/uL (ref 4.0–10.5)

## 2014-01-22 MED ORDER — NITROGLYCERIN 0.4 MG SL SUBL
0.4000 mg | SUBLINGUAL_TABLET | SUBLINGUAL | Status: DC | PRN
  Administered 2014-01-23 (×2): 0.4 mg via SUBLINGUAL
  Filled 2014-01-22: qty 1

## 2014-01-22 MED ORDER — SODIUM CHLORIDE 0.9 % IV SOLN
INTRAVENOUS | Status: DC
  Administered 2014-01-23: 01:00:00 via INTRAVENOUS

## 2014-01-22 MED ORDER — ALBUTEROL SULFATE (2.5 MG/3ML) 0.083% IN NEBU
2.5000 mg | INHALATION_SOLUTION | Freq: Four times a day (QID) | RESPIRATORY_TRACT | Status: DC
  Administered 2014-01-23 (×2): 2.5 mg via RESPIRATORY_TRACT
  Filled 2014-01-22 (×7): qty 3

## 2014-01-22 MED ORDER — CALCIUM CARBONATE 1250 (500 CA) MG PO TABS
1.0000 | ORAL_TABLET | Freq: Every day | ORAL | Status: DC
  Administered 2014-01-23: 500 mg via ORAL
  Filled 2014-01-22: qty 1

## 2014-01-22 MED ORDER — ROSUVASTATIN CALCIUM 10 MG PO TABS: 10.0000 mg | ORAL_TABLET | ORAL | Status: DC

## 2014-01-22 MED ORDER — ISOSORBIDE MONONITRATE 15 MG HALF TABLET
15.0000 mg | ORAL_TABLET | Freq: Every day | ORAL | Status: DC
  Filled 2014-01-22: qty 1

## 2014-01-22 MED ORDER — LOSARTAN POTASSIUM 25 MG PO TABS
25.0000 mg | ORAL_TABLET | Freq: Every day | ORAL | Status: DC
  Administered 2014-01-23: 25 mg via ORAL
  Filled 2014-01-22: qty 1

## 2014-01-22 MED ORDER — POTASSIUM CHLORIDE ER 10 MEQ PO TBCR
10.0000 meq | EXTENDED_RELEASE_TABLET | ORAL | Status: DC
Start: 2014-01-24 — End: 2014-01-23
  Filled 2014-01-22: qty 1

## 2014-01-22 MED ORDER — METOPROLOL TARTRATE 25 MG PO TABS
25.0000 mg | ORAL_TABLET | Freq: Two times a day (BID) | ORAL | Status: DC
  Administered 2014-01-23: 25 mg via ORAL
  Filled 2014-01-22: qty 1

## 2014-01-22 MED ORDER — FUROSEMIDE 20 MG PO TABS
20.0000 mg | ORAL_TABLET | Freq: Every day | ORAL | Status: DC
  Administered 2014-01-23: 20 mg via ORAL
  Filled 2014-01-22: qty 1

## 2014-01-22 MED ORDER — SODIUM CHLORIDE 0.9 % IJ SOLN
3.0000 mL | Freq: Two times a day (BID) | INTRAMUSCULAR | Status: DC
  Administered 2014-01-23 (×2): 3 mL via INTRAVENOUS

## 2014-01-22 MED ORDER — ASPIRIN EC 81 MG PO TBEC
81.0000 mg | DELAYED_RELEASE_TABLET | Freq: Every day | ORAL | Status: DC
  Administered 2014-01-23: 81 mg via ORAL
  Filled 2014-01-22: qty 1

## 2014-01-22 MED ORDER — ALPRAZOLAM 0.5 MG PO TABS: 0.5000 mg | ORAL_TABLET | Freq: Every evening | ORAL | Status: DC | PRN

## 2014-01-22 MED ORDER — ALBUTEROL SULFATE HFA 108 (90 BASE) MCG/ACT IN AERS: 2.0000 | INHALATION_SPRAY | Freq: Four times a day (QID) | RESPIRATORY_TRACT | Status: DC | PRN

## 2014-01-22 MED ORDER — HEPARIN SODIUM (PORCINE) 5000 UNIT/ML IJ SOLN
5000.0000 [IU] | Freq: Three times a day (TID) | INTRAMUSCULAR | Status: DC
  Administered 2014-01-23 (×3): 5000 [IU] via SUBCUTANEOUS
  Filled 2014-01-22 (×3): qty 1

## 2014-01-22 MED ORDER — MAGNESIUM OXIDE 400 (241.3 MG) MG PO TABS
400.0000 mg | ORAL_TABLET | Freq: Every day | ORAL | Status: DC
  Administered 2014-01-23: 400 mg via ORAL
  Filled 2014-01-22: qty 1

## 2014-01-22 NOTE — ED Notes (Signed)
Pt monitored by pulse ox, bp cuff, and 12-lead. 

## 2014-01-22 NOTE — ED Notes (Signed)
PA at bedside.

## 2014-01-22 NOTE — ED Provider Notes (Signed)
CSN: [id number]     Arrival date & time 01/22/14  2009 History   First MD Initiated Contact with Patient 01/22/14 2013     Chief Complaint  Patient presents with  . Chest Pain  . Dizziness     (Consider location/radiation/quality/duration/timing/severity/associated sxs/prior Treatment) HPI Comments: Patient with history of MI, complete heart block status post pacemaker placement, Congestive heart failure on Lasix -- presents with complaint of extreme dizziness and pain in her back that began at approximately 7 PM while at rest. These are similar to patient's suspected previous anginal equivalent. Patient took 324mg  aspirin and 3 nitroglycerin starting at 7:15 PM. Her symptoms resolved by 7:30. She had associated palpitations but no shortness of breath or diaphoresis.no abdominal pain, nausea, vomiting, or diarrhea. No other treatments by EMS. Patient was admitted in September 2015. She had a Myoview nuclear test done at that time. Patient has been given option of catheterization and has elected medical management. The onset of this condition was acute. The course is resolved. Aggravating factors: none. Alleviating factors: none. Patient states that her weights have been stable between 118 and 119 pounds.  Patient is a 80 y.o. female presenting with chest pain and dizziness. The history is provided by the patient and medical records.  Chest Pain Associated symptoms: back pain and dizziness   Associated symptoms: no abdominal pain, no cough, no diaphoresis, no fever, no nausea, no palpitations, no shortness of breath and not vomiting   Dizziness Associated symptoms: no chest pain, no nausea, no palpitations, no shortness of breath and no vomiting     Past Medical History  Diagnosis Date  . Complete AV block   . Presence of permanent cardiac pacemaker   . Tachycardia   . Hypertension   . Osteopenia   . Osteoarthritis   . COPD (chronic obstructive pulmonary disease)   . Pacemaker   .  Shortness of breath   . Pneumonia   . Asthma   . GERD (gastroesophageal reflux disease)   . Family history of anesthesia complication     SISTER HAS DIFFICULTY WAKING  . UTI (urinary tract infection)   . Chronic combined systolic and diastolic CHF (congestive heart failure)   . Ischemic cardiomyopathy     a.  Echo (9/15):  EF 40-45%, ant and apical and inferoapical HK, Gr 1 DD, trivial MR, mild TR, PASP 23 mmHg;  b. Myoview (9/15) large scar in mid LAD territory, no peri-infarct ischemia, EF 60%, apical AK; intermediate risk >> patient opted for med Rx  . Carotid stenosis     a. Carotid US (9/15):  Bilateral ICA 1-39%   Past Surgical History  Procedure Laterality Date  . Hernia repair  11/2002  . Eye surgery  10/2004  . Pacemaker placement      Medtr[employer]  . Cholecystectomy     Family History  Problem Relation Age of Onset  . Heart disease Sister   . Breast cancer Sister    History  Substance Use Topics  . Smoking status: Former Smoker -- 1.00 packs/day for 35 years    Types: Cigarettes    Quit date: 03/16/1987  . Smokeless tobacco: Never Used  . Alcohol Use: No   OB History    No data available     Review of Systems  Constitutional: Negative for fever and diaphoresis.  Eyes: Negative for redness.  Respiratory: Negative for cough and shortness of breath.   Cardiovascular: Negative for chest pain, palpitations and leg swelling.  Gastrointestinal: Negative  for nausea, vomiting and abdominal pain.  Genitourinary: Negative for dysuria.  Musculoskeletal: Positive for back pain. Negative for neck pain.  Skin: Negative for rash.  Neurological: Positive for dizziness and light-headedness. Negative for syncope.    Allergies  Ace inhibitors and Hydrocodone-acetaminophen  Home Medications   Prior to Admission medications   Medication Sig Start Date End Date Taking? Authorizing Provider  albuterol (PROAIR HFA) 108 (90 BASE) MCG/ACT inhaler Inhale 2 puffs into the lungs every  6 (six) hours as needed. For shortness of breath/wheezing    Historical Provider, MD  ALPRAZolam Duanne Moron) 0.5 MG tablet Take 0.5 mg by mouth at bedtime as needed. For sleep    Historical Provider, MD  aspirin EC 81 MG tablet Take 81 mg by mouth 3 (three) times a week.    Historical Provider, MD  Calcium Carbonate (CALTRATE 600) 1500 MG TABS Take 600 mg by mouth daily.     Historical Provider, MD  cholecalciferol (VITAMIN D) 1000 UNITS tablet Take 1,000 Units by mouth daily.      Historical Provider, MD  Coenzyme Q10 (CO Q 10 PO) Take 1 capsule by mouth daily.    Historical Provider, MD  Cyanocobalamin (VITAMIN B 12 PO) Take 1 tablet by mouth daily.    Historical Provider, MD  furosemide (LASIX) 20 MG tablet Take 1 tablet (20 mg total) by mouth daily. 07/25/12   Ripudeep Krystal Eaton, MD  isosorbide mononitrate (IMDUR) 30 MG 24 hr tablet Take 0.5 tablets (15 mg total) by mouth daily. 12/13/13   Liliane Shi, PA-C  levalbuterol (XOPENEX) 0.63 MG/3ML nebulizer solution Take 0.63 mg by nebulization every 6 (six) hours as needed for shortness of breath. 07/25/12   Ripudeep Krystal Eaton, MD  loratadine (CLARITIN) 10 MG tablet Take 10 mg by mouth daily.    Historical Provider, MD  losartan (COZAAR) 25 MG tablet Take 1 tablet (25 mg total) by mouth daily. 12/13/13   Liliane Shi, PA-C  Magnesium 250 MG TABS Take 250 mg by mouth daily.     Historical Provider, MD  metoprolol (LOPRESSOR) 50 MG tablet Take 0.5 tablets (25 mg total) by mouth 2 (two) times daily. 12/12/13   Deboraha Sprang, MD  Multiple Vitamins-Minerals (EYE VITAMINS PO) Take by mouth daily.      Historical Provider, MD  nitroGLYCERIN (NITROSTAT) 0.4 MG SL tablet Place 1 tablet (0.4 mg total) under the tongue every 5 (five) minutes as needed for chest pain. 11/28/13   Charlynne Cousins, MD  Omega-3 Fatty Acids (FISH OIL) 1200 MG CAPS Take 1,200 mg by mouth daily.     Historical Provider, MD  Polyethyl Glycol-Propyl Glycol (SYSTANE) 0.4-0.3 % SOLN Place 1 drop  into both eyes 2 (two) times daily as needed (dry eyes).    Historical Provider, MD  potassium chloride (K-DUR) 10 MEQ tablet Take 10 mEq by mouth 2 (two) times a week. Tuesday and friday    Historical Provider, MD  rosuvastatin (CRESTOR) 10 MG tablet AT BEDTIME 1 NIGHT A WEEK 01/02/14   Scott T Weaver, PA-C   BP 167/64 mmHg  Pulse 68  Temp(Src) 98 F (36.7 C) (Oral)  Resp 25  Ht 5\' 3"  (1.6 m)  Wt 122 lb (55.339 kg)  BMI 21.62 kg/m2  SpO2 100%   Physical Exam  Constitutional: She appears well-developed and well-nourished.  HENT:  Head: Normocephalic and atraumatic.  Mouth/Throat: Mucous membranes are normal. Mucous membranes are not dry.  Eyes: Conjunctivae are normal.  Neck: Trachea  normal and normal range of motion. Neck supple. Normal carotid pulses and no JVD present. No muscular tenderness present. Carotid bruit is not present. No tracheal deviation present.  Cardiovascular: Normal rate, regular rhythm, S1 normal, S2 normal, normal heart sounds and intact distal pulses.  Exam reveals no decreased pulses.   No murmur heard. Pulmonary/Chest: Effort normal. No respiratory distress. She has no wheezes. She exhibits no tenderness.  Abdominal: Soft. Normal aorta and bowel sounds are normal. There is no tenderness. There is no rebound and no guarding.  Musculoskeletal: Normal range of motion. She exhibits no edema or tenderness.  Neurological: She is alert.  Skin: Skin is warm and dry. She is not diaphoretic. No cyanosis. No pallor.  Psychiatric: She has a normal mood and affect.  Nursing note and vitals reviewed.   ED Course  Procedures (including critical care time) Labs Review Labs Reviewed  COMPREHENSIVE METABOLIC PANEL - Abnormal; Notable for the following:    Albumin 3.3 (*)    GFR calc non Af Amer 62 (*)    GFR calc Af Amer 71 (*)    All other components within normal limits  URINALYSIS, ROUTINE W REFLEX MICROSCOPIC - Abnormal; Notable for the following:    Specific  Gravity, Urine 1.004 (*)    Hgb urine dipstick SMALL (*)    Leukocytes, UA TRACE (*)    All other components within normal limits  CBC  URINE MICROSCOPIC-ADD ON  CBC  CREATININE, SERUM  COMPREHENSIVE METABOLIC PANEL  CBC WITH DIFFERENTIAL  TROPONIN I  TROPONIN I  TROPONIN I  PRO B NATRIURETIC PEPTIDE  I-STAT TROPOININ, ED    Imaging Review Dg Chest 2 View  01/22/2014   CLINICAL DATA:  Right-sided chest pain with dizziness and weakness.  EXAM: CHEST  2 VIEW  COMPARISON:  11/25/2013 and 07/23/2012  FINDINGS: Dual lead left-sided pacemaker is unchanged. Lungs are adequately inflated with subtle bilateral perihilar interstitial prominence. No focal consolidation or effusion. Cardiomediastinal silhouette is within normal. There is calcified plaque over the thoracic aorta. There moderate degenerate changes of the spine with mild curvature of the thoracic spine convex to the right.  IMPRESSION: Mild bilateral perihilar interstitial prominence which may be due to mild vascular congestion versus interstitial pneumonia.   Electronically Signed   By: Marin Olp M.D.   On: 01/22/2014 21:18     EKG Interpretation   Date/Time:  Tuesday January 22 2014 20:16:59 EST Ventricular Rate:  67 PR Interval:  132 QRS Duration: 149 QT Interval:  460 QTC Calculation: 486 R Axis:   -93 Text Interpretation:  Sinus rhythm IVCD, consider atypical RBBB  Anterolateral infarct, age indeterminate Baseline wander in lead(s) V4 No  significant change since last tracing Confirmed by WARD,  DO, KRISTEN  [id number]) on 01/22/2014 8:28:46 PM      Patient seen and examined. Work-up initiated. ASA prior. NTG x 3 with resolution of symptoms. No chest pain at current time.   Vital signs reviewed and are as follows: BP 167/64 mmHg  Pulse 68  Temp(Src) 98 F (36.7 C) (Oral)  Resp 25  Ht 5\' 3"  (1.6 m)  Wt 122 lb (55.339 kg)  BMI 21.62 kg/m2  SpO2 100%   8:59 PM Pt seen by Dr. Leonides Schanz. Will call cardiology when  work-up complete.   Spoke with Dr. Claiborne Billings of cardiology. He has reviewed records. Advises cycling troponins and admission for rule-out of we feel it is necessary.   Spoke with Dr. Ernestina Patches who will admit.  Pt and daughter at bedside updated and they agree with plan. Patient continues to be asymptomatic.   BP 153/61 mmHg  Pulse 72  Temp(Src) 98 F (36.7 C) (Oral)  Resp 19  Ht 5\' 3"  (1.6 m)  Wt 122 lb (55.339 kg)  BMI 21.62 kg/m2  SpO2 99%   MDM   Final diagnoses:  Anginal pain   Admit for cardiac rule-out, suspect angina.     Carlisle Cater, PA-C 01/23/14 [id number]

## 2014-01-22 NOTE — ED Provider Notes (Signed)
Medical screening examination/treatment/procedure(s) were conducted as a shared visit with non-physician practitioner(s) and myself.  I personally evaluated the patient during the encounter.   EKG Interpretation   Date/Time:  Tuesday January 22 2014 20:16:59 EST Ventricular Rate:  67 PR Interval:  132 QRS Duration: 149 QT Interval:  460 QTC Calculation: 486 R Axis:   -93 Text Interpretation:  Sinus rhythm IVCD, consider atypical RBBB  Anterolateral infarct, age indeterminate Baseline wander in lead(s) V4 No  significant change since last tracing Confirmed by Amadeo Coke,  DO, Connie Lasater  ([id number]) on 01/22/2014 8:28:46 PM      Pt is a 80 y.o. F with history of hypertension, COPD, pacemaker, CHF who presents emergency department with an episode of chest pain, lightheadedness that she describes her prior anginal equivalent.  EKG shows no significant changes.  Will obtain cardiac labs and d/w cardiology for evaluation and likely admission.  [place], DO 01/22/14 2108

## 2014-01-22 NOTE — ED Notes (Signed)
Pt arrives via EMS from home. States that she started having severe pain to the rt side of her back from her shoulder down. Pt states that she also experienced dizziness and weakness. States that she has a hx of an MI and so she took 324 ASA, NTGx3. Pt states that she is now pain free. Pt has pacemaker. Pt wears 2L [place] at home.

## 2014-01-22 NOTE — H&P (Signed)
Hospitalist Admission History and Physical  Patient name: Jamie [name] Medical record number: [id number] Date of birth: 1922-09-18 Age: 80 y.o. Gender: female  Primary Care Provider: Jerlyn Ly, MD  Chief Complaint: atypical chest pain   History of Present Illness:This is a 80 y.o. year old female with significant past medical history of AV block status post pacemaker, hypertension, COPD on 2 L of oxygen chronically, GERD, recent NSTEMI  presenting with atypical chest pain. Pt reports back and r shoulder pain ealier today. Occurred after eating dinner approx 30 mins. Also with associated SOB and dizziness. Pt states that she took SL NTG x3  And full dose ASA w/ resolution of back and R shoulder pain. Pt states that SOB and dizziness seemed to persist. Noted to have been admitted 9/13-9/16 for NSTEMI w/ noted large scar in the mid LAD territory by [facility]. Catheterization has been discussed. Pt has been contemplating this.  Presented to ER T 98, HR 60s-160s (mainly in 70s), resp 10s-20s, BP 130s-160s, Satting 98% on 2 L. CBC and BMET WNL. EKG shows paced rhythm unchanged from previous. Trop neg x1. CXR shows mild vascular congestion vs. Interstitial PNA. Per EDPA Rondel Oh), spoke w/ cards who recommends admission. They will likely consult in am.   Assessment and Plan: Jamie [name] is a 80 y.o. year old female presenting with Atypical chest pain.   Active Problems:   Anginal pain   Atypical chest pain   1- Atypical Chest Pain/CAD  -Some concern for cardiac source of sxs given prior cardiac disease -EKG and trop neg any acute changes -cycle CEs -tele bed -cont ASA -prn NTG -possible GI etiology (GERD vs. Esophageal spasm-sxs occurred after eating) -PPI  -f/u cards recs   2- COPD -stable  -no resp distress/increased WOB -noted pulm vascular congestion vs. Infiltrate on imaging -pro BNP to correlate -consider HCAP coverage as clinically indicated-defer for now   3- AV block s/p  pacemaker -paced rhythm on EKG  -tele bed  4- combined chronic systolic diastolic heart failure -fairly euvolemic on exam, though with ? pulm vascular congestion on imaging -check pro BNP to coorelate -f/u cards recs   FEN/GI: heart healthy diet  Prophylaxis: sub q heparin  Disposition: pending further evaluation  Code Status:Full Code    Patient Active Problem List   Diagnosis Date Noted  . Anginal pain 01/22/2014  . Atypical chest pain 01/22/2014  . Elevated troponin 11/26/2013  . Dizziness 11/26/2013  . Elevated brain natriuretic peptide (BNP) level 07/21/2012  . Orthostatic hypotension 08/16/2011  . Non-STEMI  possibly type II 11/17/2010  . VENTRICULAR TACHYCARDIA 09/29/2009  . HYPERTENSION, UNSPECIFIED 06/06/2008  . AV BLOCK, COMPLETE 06/06/2008  . COPD (chronic obstructive pulmonary disease) with emphysema 06/06/2008  . OSTEOARTHRITIS 06/06/2008  . OSTEOPENIA 06/06/2008  . TACHYCARDIA 06/06/2008  . Pacemaker-Medtronic 06/06/2008   Past Medical History: Past Medical History  Diagnosis Date  . Complete AV block   . Presence of permanent cardiac pacemaker   . Tachycardia   . Hypertension   . Osteopenia   . Osteoarthritis   . COPD (chronic obstructive pulmonary disease)   . Pacemaker   . Shortness of breath   . Pneumonia   . Asthma   . GERD (gastroesophageal reflux disease)   . Family history of anesthesia complication     SISTER HAS DIFFICULTY WAKING  . UTI (urinary tract infection)   . Chronic combined systolic and diastolic CHF (congestive heart failure)   . Ischemic cardiomyopathy  a.  Echo (9/15):  EF 40-45%, ant and apical and inferoapical HK, Gr 1 DD, trivial MR, mild TR, PASP 23 mmHg;  b. Myoview (9/15) large scar in mid LAD territory, no peri-infarct ischemia, EF 60%, apical AK; intermediate risk >> patient opted for med Rx  . Carotid stenosis     a. Carotid US (9/15):  Bilateral ICA 1-39%    Past Surgical History: Past Surgical History   Procedure Laterality Date  . Hernia repair  11/2002  . Eye surgery  10/2004  . Pacemaker placement      Medtronic  . Cholecystectomy      Social History: History   Social History  . Marital Status: Widowed    Spouse Name: N/A    Number of Children: N/A  . Years of Education: N/A   Occupational History  . retired    Social History Main Topics  . Smoking status: Former Smoker -- 1.00 packs/day for 35 years    Types: Cigarettes    Quit date: 03/16/1987  . Smokeless tobacco: Never Used  . Alcohol Use: No  . Drug Use: No  . Sexual Activity: No   Other Topics Concern  . None   Social History Narrative    Family History: Family History  Problem Relation Age of Onset  . Heart disease Sister   . Breast cancer Sister     Allergies: Allergies  Allergen Reactions  . Ace Inhibitors Cough  . Hydrocodone-Acetaminophen Other (See Comments)    REACTION: GI distress    Current Facility-Administered Medications  Medication Dose Route Frequency Provider Last Rate Last Dose  . [START ON 01/23/2014] 0.9 %  sodium chloride infusion   Intravenous Continuous Shanda Howells, MD      . albuterol (PROVENTIL HFA;VENTOLIN HFA) 108 (90 BASE) MCG/ACT inhaler 2 puff  2 puff Inhalation Q6H PRN Shanda Howells, MD      . Derrill Memo ON 01/23/2014] albuterol (PROVENTIL) (5 MG/ML) 0.5% nebulizer solution 2.5 mg  2.5 mg Nebulization Q6H Shanda Howells, MD      . ALPRAZolam Duanne Moron) tablet 0.5 mg  0.5 mg Oral QHS PRN Shanda Howells, MD      . Derrill Memo ON 01/23/2014] aspirin EC tablet 81 mg  81 mg Oral Daily Shanda Howells, MD      . Derrill Memo ON 01/23/2014] Calcium Carbonate TABS 600 mg  600 mg Oral Daily Shanda Howells, MD      . Derrill Memo ON 01/23/2014] furosemide (LASIX) tablet 20 mg  20 mg Oral Daily Shanda Howells, MD      . Derrill Memo ON 01/23/2014] heparin injection 5,000 Units  5,000 Units Subcutaneous 3 times per day Shanda Howells, MD      . Derrill Memo ON 01/23/2014] isosorbide mononitrate (IMDUR) 24 hr tablet 15 mg   15 mg Oral Daily Shanda Howells, MD      . Derrill Memo ON 01/23/2014] losartan (COZAAR) tablet 25 mg  25 mg Oral Daily Shanda Howells, MD      . Derrill Memo ON 01/23/2014] Magnesium TABS 250 mg  250 mg Oral Daily Shanda Howells, MD      . Derrill Memo ON 01/23/2014] metoprolol tartrate (LOPRESSOR) tablet 25 mg  25 mg Oral BID Shanda Howells, MD      . nitroGLYCERIN (NITROSTAT) SL tablet 0.4 mg  0.4 mg Sublingual Q5 min PRN Shanda Howells, MD      . Derrill Memo ON 01/24/2014] potassium chloride (K-DUR) CR tablet 10 mEq  10 mEq Oral Once per day on Mon Thu Shanda Howells, MD      . [  START ON 01/26/2014] rosuvastatin (CRESTOR) tablet 10 mg  10 mg Oral Q Sat Shanda Howells, MD      . Derrill Memo ON 01/23/2014] sodium chloride 0.9 % injection 3 mL  3 mL Intravenous Q12H Shanda Howells, MD       Current Outpatient Prescriptions  Medication Sig Dispense Refill  . albuterol (PROAIR HFA) 108 (90 BASE) MCG/ACT inhaler Inhale 2 puffs into the lungs every 6 (six) hours as needed. For shortness of breath/wheezing    . ALPRAZolam (XANAX) 0.5 MG tablet Take 0.5 mg by mouth at bedtime as needed for sleep. For sleep    . aspirin EC 81 MG tablet Take 81 mg by mouth daily.     . Calcium Carbonate (CALTRATE 600) 1500 MG TABS Take 600 mg by mouth daily.     . cholecalciferol (VITAMIN D) 1000 UNITS tablet Take 1,000 Units by mouth daily.      . Coenzyme Q10 (CO Q 10 PO) Take 1 capsule by mouth daily.    . cyanocobalamin 1000 MCG tablet Take 100 mcg by mouth daily.    . furosemide (LASIX) 20 MG tablet Take 1 tablet (20 mg total) by mouth daily. 30 tablet 2  . isosorbide mononitrate (IMDUR) 30 MG 24 hr tablet Take 0.5 tablets (15 mg total) by mouth daily. 30 tablet 11  . loratadine (CLARITIN) 10 MG tablet Take 10 mg by mouth daily.    Marland Kitchen losartan (COZAAR) 25 MG tablet Take 1 tablet (25 mg total) by mouth daily. 30 tablet 11  . Magnesium 250 MG TABS Take 250 mg by mouth daily.     . metoprolol (LOPRESSOR) 50 MG tablet Take 0.5 tablets (25 mg total) by  mouth 2 (two) times daily. 30 tablet 1  . Multiple Vitamins-Minerals (EYE VITAMINS PO) Take by mouth daily.      . nitroGLYCERIN (NITROSTAT) 0.4 MG SL tablet Place 1 tablet (0.4 mg total) under the tongue every 5 (five) minutes as needed for chest pain. 15 tablet 12  . Omega-3 Fatty Acids (FISH OIL) 1200 MG CAPS Take 1,200 mg by mouth daily.     Vladimir Faster Glycol-Propyl Glycol (SYSTANE) 0.4-0.3 % SOLN Place 1 drop into both eyes 2 (two) times daily as needed (dry eyes).    . potassium chloride (K-DUR) 10 MEQ tablet Take 10 mEq by mouth 2 (two) times a week. Tuesday and friday    . rosuvastatin (CRESTOR) 10 MG tablet AT BEDTIME 1 NIGHT A WEEK (Patient taking differently: Take 10 mg by mouth every Saturday. )    . levalbuterol (XOPENEX) 0.63 MG/3ML nebulizer solution Take 0.63 mg by nebulization every 6 (six) hours as needed for shortness of breath.     Review Of Systems: 12 point ROS negative except as noted above in HPI.  Physical Exam: Filed Vitals:   01/22/14 2345  BP: 153/61  Pulse: 72  Temp:   Resp: 19    General: alert and cooperative HEENT: PERRLA and extra ocular movement intact Heart: S1, S2 normal, no murmur, rub or gallop, regular rate and rhythm Lungs: clear to auscultation, no wheezes or rales and unlabored breathing Abdomen: abdomen is soft without significant tenderness, masses, organomegaly or guarding Extremities: extremities normal, atraumatic, no cyanosis or edema Skin:no rashes, no ecchymoses Neurology: normal without focal findings  Labs and Imaging: Lab Results  Component Value Date/Time   NA 139 01/22/2014 08:46 PM   K 4.5 01/22/2014 08:46 PM   CL 98 01/22/2014 08:46 PM   CO2 30 01/22/2014  08:46 PM   BUN 14 01/22/2014 08:46 PM   CREATININE 0.82 01/22/2014 08:46 PM   GLUCOSE 99 01/22/2014 08:46 PM   Lab Results  Component Value Date   WBC 5.8 01/22/2014   HGB 12.7 01/22/2014   HCT 38.4 01/22/2014   MCV 97.0 01/22/2014   PLT 185 01/22/2014    Dg  Chest 2 View  01/22/2014   CLINICAL DATA:  Right-sided chest pain with dizziness and weakness.  EXAM: CHEST  2 VIEW  COMPARISON:  11/25/2013 and 07/23/2012  FINDINGS: Dual lead left-sided pacemaker is unchanged. Lungs are adequately inflated with subtle bilateral perihilar interstitial prominence. No focal consolidation or effusion. Cardiomediastinal silhouette is within normal. There is calcified plaque over the thoracic aorta. There moderate degenerate changes of the spine with mild curvature of the thoracic spine convex to the right.  IMPRESSION: Mild bilateral perihilar interstitial prominence which may be due to mild vascular congestion versus interstitial pneumonia.   Electronically Signed   By: Marin Olp M.D.   On: 01/22/2014 21:18           Shanda Howells MD  Pager: [phone]

## 2014-01-23 ENCOUNTER — Encounter (HOSPITAL_COMMUNITY): Payer: Self-pay | Admitting: *Deleted

## 2014-01-23 DIAGNOSIS — R42 Dizziness and giddiness: Secondary | ICD-10-CM | POA: Diagnosis not present

## 2014-01-23 DIAGNOSIS — J439 Emphysema, unspecified: Secondary | ICD-10-CM

## 2014-01-23 DIAGNOSIS — E785 Hyperlipidemia, unspecified: Secondary | ICD-10-CM | POA: Diagnosis not present

## 2014-01-23 DIAGNOSIS — I442 Atrioventricular block, complete: Secondary | ICD-10-CM

## 2014-01-23 DIAGNOSIS — I25708 Atherosclerosis of coronary artery bypass graft(s), unspecified, with other forms of angina pectoris: Secondary | ICD-10-CM | POA: Diagnosis not present

## 2014-01-23 DIAGNOSIS — I209 Angina pectoris, unspecified: Secondary | ICD-10-CM | POA: Diagnosis not present

## 2014-01-23 DIAGNOSIS — I252 Old myocardial infarction: Secondary | ICD-10-CM | POA: Diagnosis not present

## 2014-01-23 DIAGNOSIS — R0789 Other chest pain: Secondary | ICD-10-CM

## 2014-01-23 DIAGNOSIS — Z950 Presence of cardiac pacemaker: Secondary | ICD-10-CM

## 2014-01-23 DIAGNOSIS — M549 Dorsalgia, unspecified: Secondary | ICD-10-CM | POA: Diagnosis not present

## 2014-01-23 LAB — GLUCOSE, CAPILLARY
GLUCOSE-CAPILLARY: 96 mg/dL (ref 70–99)
Glucose-Capillary: 97 mg/dL (ref 70–99)

## 2014-01-23 LAB — CBC WITH DIFFERENTIAL/PLATELET
BASOS ABS: 0.1 10*3/uL (ref 0.0–0.1)
BASOS PCT: 1 % (ref 0–1)
Eosinophils Absolute: 0.2 10*3/uL (ref 0.0–0.7)
Eosinophils Relative: 4 % (ref 0–5)
HCT: 36.1 % (ref 36.0–46.0)
Hemoglobin: 12 g/dL (ref 12.0–15.0)
Lymphocytes Relative: 34 % (ref 12–46)
Lymphs Abs: 2 10*3/uL (ref 0.7–4.0)
MCH: 31.7 pg (ref 26.0–34.0)
MCHC: 33.2 g/dL (ref 30.0–36.0)
MCV: 95.5 fL (ref 78.0–100.0)
Monocytes Absolute: 0.5 10*3/uL (ref 0.1–1.0)
Monocytes Relative: 9 % (ref 3–12)
NEUTROS PCT: 52 % (ref 43–77)
Neutro Abs: 3.1 10*3/uL (ref 1.7–7.7)
Platelets: 176 10*3/uL (ref 150–400)
RBC: 3.78 MIL/uL — ABNORMAL LOW (ref 3.87–5.11)
RDW: 13.1 % (ref 11.5–15.5)
WBC: 5.9 10*3/uL (ref 4.0–10.5)

## 2014-01-23 LAB — TROPONIN I: Troponin I: <0.3 ng/mL (ref <0.30)

## 2014-01-23 LAB — COMPREHENSIVE METABOLIC PANEL
ALK PHOS: 70 U/L (ref 39–117)
ALT: 12 U/L (ref 0–35)
ANION GAP: 9 (ref 5–15)
AST: 16 U/L (ref 0–37)
Albumin: 3.1 g/dL — ABNORMAL LOW (ref 3.5–5.2)
BILIRUBIN TOTAL: 0.5 mg/dL (ref 0.3–1.2)
BUN: 11 mg/dL (ref 6–23)
CHLORIDE: 103 meq/L (ref 96–112)
CO2: 30 mEq/L (ref 19–32)
Calcium: 8.5 mg/dL (ref 8.4–10.5)
Creatinine, Ser: 0.72 mg/dL (ref 0.50–1.10)
GFR, EST AFRICAN AMERICAN: 86 mL/min — AB (ref >90)
GFR, EST NON AFRICAN AMERICAN: 74 mL/min — AB (ref >90)
GLUCOSE: 98 mg/dL (ref 70–99)
Potassium: 4.2 mEq/L (ref 3.7–5.3)
Sodium: 142 mEq/L (ref 137–147)
Total Protein: 5.5 g/dL — ABNORMAL LOW (ref 6.0–8.3)

## 2014-01-23 LAB — URINALYSIS, ROUTINE W REFLEX MICROSCOPIC
Bilirubin Urine: NEGATIVE
GLUCOSE, UA: NEGATIVE mg/dL
Ketones, ur: NEGATIVE mg/dL
NITRITE: NEGATIVE
PH: 5.5 (ref 5.0–8.0)
Protein, ur: NEGATIVE mg/dL
SPECIFIC GRAVITY, URINE: 1.004 — AB (ref 1.005–1.030)
Urobilinogen, UA: 0.2 mg/dL (ref 0.0–1.0)

## 2014-01-23 LAB — URINE MICROSCOPIC-ADD ON

## 2014-01-23 LAB — PRO B NATRIURETIC PEPTIDE: Pro B Natriuretic peptide (BNP): 687.9 pg/mL — ABNORMAL HIGH (ref 0–450)

## 2014-01-23 MED ORDER — RANOLAZINE ER 500 MG PO TB12: 500.0000 mg | ORAL_TABLET | Freq: Two times a day (BID) | ORAL | Status: DC

## 2014-01-23 MED ORDER — RANOLAZINE ER 500 MG PO TB12
500.0000 mg | ORAL_TABLET | Freq: Two times a day (BID) | ORAL | Status: DC
  Administered 2014-01-23: 500 mg via ORAL
  Filled 2014-01-23: qty 1

## 2014-01-23 MED ORDER — PANTOPRAZOLE SODIUM 40 MG PO TBEC
40.0000 mg | DELAYED_RELEASE_TABLET | Freq: Every day | ORAL | Status: DC
  Administered 2014-01-23: 40 mg via ORAL
  Filled 2014-01-23: qty 1

## 2014-01-23 MED ORDER — ISOSORBIDE MONONITRATE ER 30 MG PO TB24
30.0000 mg | ORAL_TABLET | Freq: Every day | ORAL | Status: DC
Start: 2014-01-23 — End: 2014-01-23
  Administered 2014-01-23: 30 mg via ORAL
  Filled 2014-01-23: qty 1

## 2014-01-23 MED ORDER — ROSUVASTATIN CALCIUM 10 MG PO TABS: 10.0000 mg | ORAL_TABLET | ORAL | Status: DC

## 2014-01-23 MED ORDER — ISOSORBIDE MONONITRATE ER 30 MG PO TB24: 30.0000 mg | ORAL_TABLET | Freq: Every day | ORAL | Status: DC

## 2014-01-23 MED ORDER — ONDANSETRON HCL 4 MG/2ML IJ SOLN: 4.0000 mg | Freq: Three times a day (TID) | INTRAMUSCULAR | Status: AC | PRN

## 2014-01-23 MED ORDER — ROSUVASTATIN CALCIUM 10 MG PO TABS
5.0000 mg | ORAL_TABLET | ORAL | Status: DC
Start: 2014-01-23 — End: 2014-01-23

## 2014-01-23 NOTE — Progress Notes (Signed)
Pt admitted from ED with chest pain accompanied by daughter, denies any pain at this time, pt settled in bed, v/s stable, tele monitor put on pt and call light within pt's reach, will however continue to monitor. Obasogie-Asidi, Corinthia Helmers Efe

## 2014-01-23 NOTE — Progress Notes (Addendum)
Pt complaining of nausea, chest discomfort, described as burning, nitro given X2 per orders, states relief, EKG ordered and placed on chart.

## 2014-01-23 NOTE — Discharge Summary (Signed)
Physician Discharge Summary  Jamie [name] MWN:[id number] DOB: 1922-11-17 DOA: 01/22/2014  PCP: Jamie Ly, MD  Admit date: 01/22/2014 Discharge date: 01/23/2014  Time spent: 40 minutes  Recommendations for Outpatient Follow-up:  1. Follow-up with primary care physician within one week. 2. Follow-up with cardiology on 12/8 for pacemaker check.  Discharge Diagnoses:  Active Problems:   AV BLOCK, COMPLETE   COPD (chronic obstructive pulmonary disease) with emphysema   Pacemaker-Medtronic   Anginal pain   Atypical chest pain   Discharge Condition: stable  Diet recommendation: heart healthy  Filed Weights   01/22/14 2016 01/23/14 0037 01/23/14 0515  Weight: 55.339 kg (122 lb) 55.475 kg (122 lb 4.8 oz) 55.339 kg (122 lb)    History of present illness:  This is a 80 y.o. year old female with significant past medical history of AV block status post pacemaker, hypertension, COPD on 2 L of oxygen chronically, GERD, recent NSTEMI presenting with atypical chest pain. Pt reports back and r shoulder pain ealier today. Occurred after eating dinner approx 30 mins. Also with associated SOB and dizziness. Pt states that she took SL NTG x3 And full dose ASA w/ resolution of back and R shoulder pain. Pt states that SOB and dizziness seemed to persist. Noted to have been admitted 9/13-9/16 for NSTEMI w/ noted large scar in the mid LAD territory by [facility]. Catheterization has been discussed. Pt has been contemplating this.  Presented to ER T 98, HR 60s-160s (mainly in 70s), resp 10s-20s, BP 130s-160s, Satting 98% on 2 L. CBC and BMET WNL. EKG shows paced rhythm unchanged from previous. Trop neg x1. CXR shows mild vascular congestion vs. Interstitial PNA. Per EDPA Rondel Oh), spoke w/ cards who recommends admission. They will likely consult in am.   Hospital Course:    Chest pain/CAD Patient has history of CAD, no STEMI back in September presented with atypical chest pain. EKG showed no acute  changes, troponin 3 was negative. Cardiology consulted and recommended medical management, and discharged home. Increase Imdur to 30 mg daily, continue Lopressor 25 mg by mouth daily, add Ranexa 500 mg twice a day. Continue aspirin at 81 mg and add Crestor every other day, follow-up with cardiology as outpatient.  Hyperlipidemia Lipid profile showed LDL at 102 from September, cardiology recommended to use increase Crestor to 5 mg every other day In the past it was felt Crestor was causing confusion, if patient is following some please consider reducing back to once per week  History of complete AV block Status post pacemaker, follow-up with Dr. Caryl Comes as outpatient.  COPD Chronic, stable no respiratory issues. Patient is on 2 L of oxygen at home.  Chronic combined CHF LVEF of 40-45% with grade 1 diastolic dysfunction, patient does not have any symptoms of decompensation. Continue home medications.  Dizziness Patient mentioned she was dizzy at home, evaluated by PT and showed no evidence of dizziness or problems with walking.   Procedures:  none  Consultations:  cardiology  Discharge Exam: Filed Vitals:   01/23/14 0956  BP: 135/52  Pulse: 71  Temp:   Resp:    General: Alert and awake, oriented x3, not in any acute distress. HEENT: anicteric sclera, pupils reactive to light and accommodation, EOMI CVS: S1-S2 clear, no murmur rubs or gallops Chest: clear to auscultation bilaterally, no wheezing, rales or rhonchi Abdomen: soft nontender, nondistended, normal bowel sounds, no organomegaly Extremities: no cyanosis, clubbing or edema noted bilaterally Neuro: Cranial nerves II-XII intact, no focal neurological deficits  Discharge  Instructions You were cared for by a hospitalist during your hospital stay. If you have any questions about your discharge medications or the care you received while you were in the hospital after you are discharged, you can call the unit and asked to  speak with the hospitalist on call if the hospitalist that took care of you is not available. Once you are discharged, your primary care physician will handle any further medical issues. Please note that NO REFILLS for any discharge medications will be authorized once you are discharged, as it is imperative that you return to your primary care physician (or establish a relationship with a primary care physician if you do not have one) for your aftercare needs so that they can reassess your need for medications and monitor your lab values.  Discharge Instructions    Diet - low sodium heart healthy    Complete by:  As directed      Increase activity slowly    Complete by:  As directed           Current Discharge Medication List    START taking these medications   Details  ranolazine (RANEXA) 500 MG 12 hr tablet Take 1 tablet (500 mg total) by mouth 2 (two) times daily. Qty: 60 tablet, Refills: 0      CONTINUE these medications which have CHANGED   Details  isosorbide mononitrate (IMDUR) 30 MG 24 hr tablet Take 1 tablet (30 mg total) by mouth daily. Qty: 30 tablet, Refills: 0   Associated Diagnoses: Essential hypertension    rosuvastatin (CRESTOR) 10 MG tablet Take 1 tablet (10 mg total) by mouth every other day. Qty: 30 tablet, Refills: 0      CONTINUE these medications which have NOT CHANGED   Details  albuterol (PROAIR HFA) 108 (90 BASE) MCG/ACT inhaler Inhale 2 puffs into the lungs every 6 (six) hours as needed. For shortness of breath/wheezing    ALPRAZolam (XANAX) 0.5 MG tablet Take 0.5 mg by mouth at bedtime as needed for sleep. For sleep    aspirin EC 81 MG tablet Take 81 mg by mouth daily.     Calcium Carbonate (CALTRATE 600) 1500 MG TABS Take 600 mg by mouth daily.     cholecalciferol (VITAMIN D) 1000 UNITS tablet Take 1,000 Units by mouth daily.      Coenzyme Q10 (CO Q 10 PO) Take 1 capsule by mouth daily.    cyanocobalamin 1000 MCG tablet Take 100 mcg by mouth  daily.    furosemide (LASIX) 20 MG tablet Take 1 tablet (20 mg total) by mouth daily. Qty: 30 tablet, Refills: 2    loratadine (CLARITIN) 10 MG tablet Take 10 mg by mouth daily.    losartan (COZAAR) 25 MG tablet Take 1 tablet (25 mg total) by mouth daily. Qty: 30 tablet, Refills: 11   Associated Diagnoses: Essential hypertension    Magnesium 250 MG TABS Take 250 mg by mouth daily.     metoprolol (LOPRESSOR) 50 MG tablet Take 0.5 tablets (25 mg total) by mouth 2 (two) times daily. Qty: 30 tablet, Refills: 1    Multiple Vitamins-Minerals (EYE VITAMINS PO) Take by mouth daily.      nitroGLYCERIN (NITROSTAT) 0.4 MG SL tablet Place 1 tablet (0.4 mg total) under the tongue every 5 (five) minutes as needed for chest pain. Qty: 15 tablet, Refills: 12    Omega-3 Fatty Acids (FISH OIL) 1200 MG CAPS Take 1,200 mg by mouth daily.     Polyethyl Glycol-Propyl  Glycol (SYSTANE) 0.4-0.3 % SOLN Place 1 drop into both eyes 2 (two) times daily as needed (dry eyes).    potassium chloride (K-DUR) 10 MEQ tablet Take 10 mEq by mouth 2 (two) times a week. Tuesday and friday    levalbuterol (XOPENEX) 0.63 MG/3ML nebulizer solution Take 0.63 mg by nebulization every 6 (six) hours as needed for shortness of breath.       Allergies  Allergen Reactions  . Ace Inhibitors Cough  . Hydrocodone-Acetaminophen Other (See Comments)    REACTION: GI distress   Follow-up Information    Follow up with PERINI,MARK A, MD In 1 week.   Specialty:  Internal Medicine   Contact information:   [place] [address] [phone]        The results of significant diagnostics from this hospitalization (including imaging, microbiology, ancillary and laboratory) are listed below for reference.    Significant Diagnostic Studies: Dg Chest 2 View  01/22/2014   CLINICAL DATA:  Right-sided chest pain with dizziness and weakness.  EXAM: CHEST  2 VIEW  COMPARISON:  11/25/2013 and 07/23/2012  FINDINGS: Dual  lead left-sided pacemaker is unchanged. Lungs are adequately inflated with subtle bilateral perihilar interstitial prominence. No focal consolidation or effusion. Cardiomediastinal silhouette is within normal. There is calcified plaque over the thoracic aorta. There moderate degenerate changes of the spine with mild curvature of the thoracic spine convex to the right.  IMPRESSION: Mild bilateral perihilar interstitial prominence which may be due to mild vascular congestion versus interstitial pneumonia.   Electronically Signed   By: Marin Olp M.D.   On: 01/22/2014 21:18    Microbiology: No results found for this or any previous visit (from the past 240 hour(s)).   Labs: Basic Metabolic Panel:  Recent Labs Lab 01/22/14 2046 01/23/14 0450  NA 139 142  K 4.5 4.2  CL 98 103  CO2 30 30  GLUCOSE 99 98  BUN 14 11  CREATININE 0.82 0.72  CALCIUM 9.0 8.5   Liver Function Tests:  Recent Labs Lab 01/22/14 2046 01/23/14 0450  AST 18 16  ALT 14 12  ALKPHOS 89 70  BILITOT 0.3 0.5  PROT 6.2 5.5*  ALBUMIN 3.3* 3.1*   No results for input(s): LIPASE, AMYLASE in the last 168 hours. No results for input(s): AMMONIA in the last 168 hours. CBC:  Recent Labs Lab 01/22/14 2046 01/23/14 0450  WBC 5.8 5.9  NEUTROABS  --  3.1  HGB 12.7 12.0  HCT 38.4 36.1  MCV 97.0 95.5  PLT 185 176   Cardiac Enzymes:  Recent Labs Lab 01/23/14 0122 01/23/14 0450 01/23/14 1133  TROPONINI <0.30 <0.30 <0.30   BNP: BNP (last 3 results)  Recent Labs  01/23/14 0122  PROBNP 687.9*   CBG:  Recent Labs Lab 01/23/14 0759 01/23/14 1147  GLUCAP 97 96       Signed:  Edmon Magid A  [name] 01/23/2014, 2:02 PM

## 2014-01-23 NOTE — Plan of Care (Signed)
Problem: Consults Goal: Chest Pain Patient Education (See Patient Education module for education specifics.) Outcome: Progressing Goal: Skin Care Protocol Initiated - if Braden Score 18 or less If consults are not indicated, leave blank or document N/A Outcome: Progressing  Problem: Phase I Progression Outcomes Goal: Hemodynamically stable Outcome: Progressing Goal: Anginal pain relieved Outcome: Progressing Goal: Aspirin unless contraindicated Outcome: Not Applicable Date Met:  43/71/90

## 2014-01-23 NOTE — Progress Notes (Addendum)
Pt discharged to home via W/C with portable O2 tank, accompanied by family, home medication education provided, new orders for Crestor 5mg  every other day, pt verbalized understanding. Edward Qualia RN

## 2014-01-23 NOTE — Progress Notes (Signed)
PROGRESS NOTE  Jamie [name] BDZ:[id number] DOB: 1922-06-13 DOA: 01/22/2014 PCP: Jerlyn Ly, MD  HPI/Subjective: 80 yo female with significant PMH of AV block status post pacemaker, hypertension, COPD on 2 L of oxygen chronically, GERD, recent NSTEMI complains of atypical chest pain. Pain located in back and right shoulder with associated shortness of breath and dizziness.  Pt also experienced burning type pain in her chest.  Pain noticed approximately 30 minutes after eating.  Pt states that she took SL NTG x3and ASA 325mg  with resolution of pain.  Shortness of breath and dizziness seemed to persist. Noted to have been admitted 9/13-9/16 for NSTEMI w/ noted large scar in the mid LAD territory by [facility].  Pt has been contemplating catheterization since previous admission but does not want any surgeries.    Episode of burning chest pain experienced this morning with relief after NTG x 2 but otherwise states she is feeling better.    Assessment/Plan:  Atypical Chest Pain/CAD: Considering possible cardiac cause given hx of CAD and NSTEMI but possibly GI cause.  EKG shows no acute changes and Troponin i negative.  Cardiology recommends medical management increase Imdur to 30mg  daily, continue Lopressor 25mg  bid, add Ranolazine 500mg  bid, continue ASA 81mg , and add Crestor 5mg  qod. Cardiology recommends d/c if pt can walk without chest pain.     Hyperlipidemia: Lipid panel from 9/15 show elevated LDL at 102. Cardiology starting pt on Crestor 5mg  qod.    AV block s/p pacemaker: EKG shows paced rhythm.  Continue telemetry until discharge.     COPD: Chronic problem.  Stable no respiratory distress on O2.  Requires 2L O2 at home.        Combined chronic systolic diastolic heart failure: Chronic stable.  No evidence of fluid overload.  2D ECHO from 11/2013 shows EF of 40/45%.  BNP 687.9. Continue Lasix 20mg  daily.  Cardiology recommends PO Lasix at home.      DVT Prophylaxis:  Heparin 5,000  q8h  Code Status: Full Family Communication: Pt is awake and alert Disposition Plan: Will d/c home when medically appropriate.    Consultants:  Cardiology  Procedures:  None  Antibiotics:  None  Objective: Filed Vitals:   01/23/14 0950 01/23/14 0953 01/23/14 0956 01/23/14 1005  BP:  135/52 135/52   Pulse:  78 71   Temp:  98.3 F (36.8 C)    TempSrc:  Axillary    Resp:  18    Height:      Weight:      SpO2: 95% 95%  95%    Intake/Output Summary (Last 24 hours) at 01/23/14 1303 Last data filed at 01/23/14 1000  Gross per 24 hour  Intake    513 ml  Output      0 ml  Net    513 ml   Filed Weights   01/22/14 2016 01/23/14 0037 01/23/14 0515  Weight: 55.339 kg (122 lb) 55.475 kg (122 lb 4.8 oz) 55.339 kg (122 lb)    Exam: General: Well developed, well nourished, NAD, appears stated age  24:  Anicteic Sclera, MMM. Cardiovascular: RRR, S1 S2 auscultated, no rubs, murmurs or gallops.   Respiratory: Clear to auscultation bilaterally with equal chest rise  Abdomen: Soft, nontender, nondistended, + bowel sounds  Extremities: warm dry without cyanosis clubbing or edema.  Neuro: AAOx3, cranial nerves grossly intact. Strength 5/5 in upper and lower extremities  Psych: Normal affect and demeanor with intact judgement and insight   Data Reviewed: Basic Metabolic  Panel:  Recent Labs Lab 01/22/14 2046 01/23/14 0450  NA 139 142  K 4.5 4.2  CL 98 103  CO2 30 30  GLUCOSE 99 98  BUN 14 11  CREATININE 0.82 0.72  CALCIUM 9.0 8.5   Liver Function Tests:  Recent Labs Lab 01/22/14 2046 01/23/14 0450  AST 18 16  ALT 14 12  ALKPHOS 89 70  BILITOT 0.3 0.5  PROT 6.2 5.5*  ALBUMIN 3.3* 3.1*   CBC:  Recent Labs Lab 01/22/14 2046 01/23/14 0450  WBC 5.8 5.9  NEUTROABS  --  3.1  HGB 12.7 12.0  HCT 38.4 36.1  MCV 97.0 95.5  PLT 185 176   Cardiac Enzymes:  Recent Labs Lab 01/23/14 0122 01/23/14 0450 01/23/14 1133  TROPONINI <0.30 <0.30 <0.30    BNP (last 3 results)  Recent Labs  01/23/14 0122  PROBNP 687.9*   CBG:  Recent Labs Lab 01/23/14 0759 01/23/14 1147  GLUCAP 97 96    Studies: Dg Chest 2 View  01/22/2014   CLINICAL DATA:  Right-sided chest pain with dizziness and weakness.  EXAM: CHEST  2 VIEW  COMPARISON:  11/25/2013 and 07/23/2012  FINDINGS: Dual lead left-sided pacemaker is unchanged. Lungs are adequately inflated with subtle bilateral perihilar interstitial prominence. No focal consolidation or effusion. Cardiomediastinal silhouette is within normal. There is calcified plaque over the thoracic aorta. There moderate degenerate changes of the spine with mild curvature of the thoracic spine convex to the right.  IMPRESSION: Mild bilateral perihilar interstitial prominence which may be due to mild vascular congestion versus interstitial pneumonia.   Electronically Signed   By: Marin Olp M.D.   On: 01/22/2014 21:18    Scheduled Meds: . albuterol  2.5 mg Nebulization Q6H  . aspirin EC  81 mg Oral Daily  . calcium carbonate  1 tablet Oral Daily  . furosemide  20 mg Oral Daily  . heparin  5,000 Units Subcutaneous 3 times per day  . isosorbide mononitrate  30 mg Oral Daily  . losartan  25 mg Oral Daily  . magnesium oxide  400 mg Oral Daily  . metoprolol  25 mg Oral BID  . pantoprazole  40 mg Oral Daily  . [START ON 01/24/2014] potassium chloride  10 mEq Oral Once per day on Mon Thu  . ranolazine  500 mg Oral BID  . rosuvastatin  5 mg Oral QODAY  . sodium chloride  3 mL Intravenous Q12H   Continuous Infusions: . sodium chloride 50 mL/hr at 01/23/14 1000    Active Problems:   AV BLOCK, COMPLETE   COPD (chronic obstructive pulmonary disease) with emphysema   Pacemaker-Medtronic   Anginal pain   Atypical chest pain    Adelene Idler PA-S  [name] Pager [phone]. If 7PM-7AM, please contact night-coverage at [email or web address], password [facility] 01/23/2014, 1:03 PM  LOS: 1 day        Addendum  Patient seen and examined, chart and data base reviewed.  I agree with the above assessment and plan.  For full details please see Mrs. Adelene Idler PA-S note.  I reviewed and amended the above note as appropriate.   Birdie Hopes, MD [facility] Pager: [phone] 01/23/2014, 1:24 PM

## 2014-01-23 NOTE — Evaluation (Signed)
Physical Therapy Evaluation Patient Details Name: Jamie [name] MRN: [id number] DOB: 1922/05/23 Today's Date: 01/23/2014   History of Present Illness  Pt adm with chest pain and dizziness.  Clinical Impression  Pt doing well with mobility and no further PT needed.  Ready for dc from PT standpoint. No chest pain or dizziness with amb.      Follow Up Recommendations No PT follow up    Equipment Recommendations  None recommended by PT    Recommendations for Other Services       Precautions / Restrictions Precautions Precautions: None Restrictions Weight Bearing Restrictions: No      Mobility  Bed Mobility Overal bed mobility: Modified Independent                Transfers Overall transfer level: Modified independent                  Ambulation/Gait Ambulation/Gait assistance: Min guard Ambulation Distance (Feet): 300 Feet Assistive device: None;1 person hand held assist Gait Pattern/deviations: Step-through pattern     General Gait Details: Slightly unsteady but no loss of balance.  Stairs            Wheelchair Mobility    Modified Rankin (Stroke Patients Only)       Balance Overall balance assessment: Needs assistance Sitting-balance support: No upper extremity supported Sitting balance-Leahy Scale: Normal     Standing balance support: No upper extremity supported Standing balance-Leahy Scale: Good                               Pertinent Vitals/Pain Pain Assessment: No/denies pain    Home Living Family/patient expects to be discharged to:: Private residence Living Arrangements: Alone Available Help at Discharge: Family;Available PRN/intermittently Type of Home: House       Home Layout: Two level Home Equipment: Cane - single point      Prior Function Level of Independence: Independent         Comments: Uses cane if out in yard for extended time.     Hand Dominance   Dominant Hand: Right     Extremity/Trunk Assessment   Upper Extremity Assessment: Overall WFL for tasks assessed           Lower Extremity Assessment: Overall WFL for tasks assessed         Communication   Communication: No difficulties  Cognition Arousal/Alertness: Awake/alert Behavior During Therapy: WFL for tasks assessed/performed Overall Cognitive Status: Within Functional Limits for tasks assessed                      General Comments      Exercises        Assessment/Plan    PT Assessment Patent does not need any further PT services  PT Diagnosis Difficulty walking   PT Problem List    PT Treatment Interventions     PT Goals (Current goals can be found in the Care Plan section) Acute Rehab PT Goals Patient Stated Goal: Return home PT Goal Formulation: All assessment and education complete, DC therapy    Frequency     Barriers to discharge        Co-evaluation               End of Session Equipment Utilized During Treatment: Oxygen Activity Tolerance: Patient tolerated treatment well Patient left: in bed;with family/visitor present;with call bell/phone within reach (sitting EOB) Nurse Communication: Mobility  status         Time: 1225-1240 PT Time Calculation (min) (ACUTE ONLY): 15 min   Charges:   PT Evaluation $Initial PT Evaluation Tier I: 1 Procedure     PT G Codes:          Christon Parada February 07, 2014, 12:56 PM  [facility] PT [phone]

## 2014-01-23 NOTE — Consult Note (Signed)
CARDIOLOGY CONSULT NOTE  Patient ID: Jamie [name] MRN: [id number] DOB/AGE: [birth year] 80 y.o.  Admit date: 01/22/2014 Primary Physician: Dr. Joylene Draft Primary Cardiologist: Dr. Caryl Comes Reason for Consultation: Dizziness, chest pain  HPI:  Jamie [name] is a 80 y.o. female with a hx of CHB s/p PPM, HTN, COPD, GERD, diastolic CHF, and presumed CAD.  She was admitted last night with dizziness and chest pain.   Prior to yesterday, she was admitted in 11/2013 with elevated troponin after presenting with complaints of dizziness and visual disturbance. Symptoms were not felt to be consistent with ACS. Dizziness and visual changes were suspicious for possible TIA. CT was negative. Carotid Dopplers demonstrated no significant ICA stenosis (bilateral 1-39%). Peak troponin was 0.46. Echocardiogram demonstrated worsening LV function with an EF of 40-45% with wall motion abnormalities in the LAD territory. Findings appeared to be consistent with a completed LAD infarction. Patient noted history of indigestion about 3 weeks prior to admission with associated back pain. Invasive versus noninvasive evaluation was discussed. Patient preferred to avoid invasive testing given advanced age. Inpatient Cardiolite was obtained. Cardiolite study demonstrated large scar in the mid LAD territory but no ischemia (EF was normal on Cardiolite). No further workup was planned. Medications were adjusted.   Patient was seen in followup in 10/15 a couple of times by Richardson Dopp.  She reported NTG-responsive CP consistent with angina. She also felt like Crestor was causing confusion (decreased to once a week with improvement). The angina improved after starting Imdur 15 mg daily.   Yesterday evening, patient felt "dizzy" while sitting on her bed.  This seemed like a lightheaded sensation and not a vertigo-type spinning.  She then noted right-sided back pain and "heat" in her chest.  This all occurred after dinner.  She took  NTG which resolved the back pain and chest "heat."  However, the dizziness, which bothered her the most, persisted.  She came to the ER where the dizziness had resolved.  BP was ok.  Troponin has been negative.  ECG showed a paced rhythm.  This morning, she feels fine.   Review of systems complete and found to be negative unless listed above in HPI  Past Medical History: 1. COPD 2. Complete heart block s/p Medtronic PCM 3. CAD: 9/15 admission with NSTEMI.  Elected conservative treatment, Cardiolite done with EF >60% and large scar in mid-LAD territory with minimal ischemia.  4. Ischemic cardiomyopathy: Echo (9/15) with EF 40-45%, anterior and apical hypokinesis, trivial MR.  However, EF > 60% on Cardiolite.  5. GERD 6. HTN 7. Hyperlipidemia  Family History  Problem Relation Age of Onset  . Heart disease Sister   . Breast cancer Sister     History   Social History  . Marital Status: Widowed    Spouse Name: N/A    Number of Children: N/A  . Years of Education: N/A   Occupational History  . retired    Social History Main Topics  . Smoking status: Former Smoker -- 1.00 packs/day for 35 years    Types: Cigarettes    Quit date: 03/16/1987  . Smokeless tobacco: Never Used  . Alcohol Use: No  . Drug Use: No  . Sexual Activity: No   Other Topics Concern  . Not on file   Social History Narrative     Prescriptions prior to admission  Medication Sig Dispense Refill Last Dose  . albuterol (PROAIR HFA) 108 (90 BASE) MCG/ACT inhaler Inhale 2 puffs into the lungs every  6 (six) hours as needed. For shortness of breath/wheezing   Past Month at Unknown time  . ALPRAZolam (XANAX) 0.5 MG tablet Take 0.5 mg by mouth at bedtime as needed for sleep. For sleep   Past Week at Unknown time  . aspirin EC 81 MG tablet Take 81 mg by mouth daily.    01/22/2014 at Unknown time  . Calcium Carbonate (CALTRATE 600) 1500 MG TABS Take 600 mg by mouth daily.    01/22/2014 at Unknown time  .  cholecalciferol (VITAMIN D) 1000 UNITS tablet Take 1,000 Units by mouth daily.     01/22/2014 at Unknown time  . Coenzyme Q10 (CO Q 10 PO) Take 1 capsule by mouth daily.   01/22/2014 at Unknown time  . cyanocobalamin 1000 MCG tablet Take 100 mcg by mouth daily.   01/22/2014 at Unknown time  . furosemide (LASIX) 20 MG tablet Take 1 tablet (20 mg total) by mouth daily. 30 tablet 2 01/22/2014 at Unknown time  . isosorbide mononitrate (IMDUR) 30 MG 24 hr tablet Take 0.5 tablets (15 mg total) by mouth daily. 30 tablet 11 01/22/2014 at Unknown time  . loratadine (CLARITIN) 10 MG tablet Take 10 mg by mouth daily.   01/22/2014 at Unknown time  . losartan (COZAAR) 25 MG tablet Take 1 tablet (25 mg total) by mouth daily. 30 tablet 11 01/22/2014 at Unknown time  . Magnesium 250 MG TABS Take 250 mg by mouth daily.    01/22/2014 at Unknown time  . metoprolol (LOPRESSOR) 50 MG tablet Take 0.5 tablets (25 mg total) by mouth 2 (two) times daily. 30 tablet 1 01/22/2014 at 1800  . Multiple Vitamins-Minerals (EYE VITAMINS PO) Take by mouth daily.     01/22/2014 at Unknown time  . nitroGLYCERIN (NITROSTAT) 0.4 MG SL tablet Place 1 tablet (0.4 mg total) under the tongue every 5 (five) minutes as needed for chest pain. 15 tablet 12 01/22/2014 at Unknown time  . Omega-3 Fatty Acids (FISH OIL) 1200 MG CAPS Take 1,200 mg by mouth daily.    01/22/2014 at Unknown time  . Polyethyl Glycol-Propyl Glycol (SYSTANE) 0.4-0.3 % SOLN Place 1 drop into both eyes 2 (two) times daily as needed (dry eyes).   Past Month at Unknown time  . potassium chloride (K-DUR) 10 MEQ tablet Take 10 mEq by mouth 2 (two) times a week. Tuesday and friday   01/22/2014 at Unknown time  . rosuvastatin (CRESTOR) 10 MG tablet AT BEDTIME 1 NIGHT A WEEK (Patient taking differently: Take 10 mg by mouth every Saturday. )   Past Week at Unknown time  . levalbuterol (XOPENEX) 0.63 MG/3ML nebulizer solution Take 0.63 mg by nebulization every 6 (six) hours as needed  for shortness of breath.   past 2 weeks   Current Scheduled Meds: . albuterol  2.5 mg Nebulization Q6H  . aspirin EC  81 mg Oral Daily  . calcium carbonate  1 tablet Oral Daily  . furosemide  20 mg Oral Daily  . heparin  5,000 Units Subcutaneous 3 times per day  . isosorbide mononitrate  30 mg Oral Daily  . losartan  25 mg Oral Daily  . magnesium oxide  400 mg Oral Daily  . metoprolol  25 mg Oral BID  . pantoprazole  40 mg Oral Daily  . [START ON 01/24/2014] potassium chloride  10 mEq Oral Once per day on Mon Thu  . ranolazine  500 mg Oral BID  . rosuvastatin  5 mg Oral QODAY  . sodium  chloride  3 mL Intravenous Q12H   Continuous Infusions: . sodium chloride 50 mL/hr at 01/23/14 0129   PRN Meds:.ALPRAZolam, nitroGLYCERIN, ondansetron (ZOFRAN) IV   Physical exam Blood pressure 141/61, pulse 93, temperature 99.1 F (37.3 C), temperature source Oral, resp. rate 18, height 5\' 3"  (1.6 m), weight 122 lb (55.339 kg), SpO2 97 %. General: NAD Neck: JVP 7 cm, no thyromegaly or thyroid nodule.  Lungs: Rare rhonchi. CV: Nondisplaced PMI.  Heart regular S1/S2, no S3/S4, no murmur.  No peripheral edema.  No carotid bruit.  Normal pedal pulses.  Abdomen: Soft, nontender, no hepatosplenomegaly, no distention.  Skin: Intact without lesions or rashes.  Neurologic: Alert and oriented x 3.  Psych: Normal affect. Extremities: No clubbing or cyanosis.  HEENT: Normal.   Labs:   Lab Results  Component Value Date   WBC 5.9 01/23/2014   HGB 12.0 01/23/2014   HCT 36.1 01/23/2014   MCV 95.5 01/23/2014   PLT 176 01/23/2014    Recent Labs Lab 01/23/14 0450  NA 142  K 4.2  CL 103  CO2 30  BUN 11  CREATININE 0.72  CALCIUM 8.5  PROT 5.5*  BILITOT 0.5  ALKPHOS 70  ALT 12  AST 16  GLUCOSE 98   Lab Results  Component Value Date   CKTOTAL 84 11/26/2013   CKMB 4.1* 11/26/2013   TROPONINI <0.30 01/23/2014     Radiology: - CXR: Mild peri-hilar interstitial prominence.  EKG: NSR with  v-pacing  ASSESSMENT AND PLAN: Jamie [name] is a 80 y.o. female with a hx of CHB s/p PPM, HTN, COPD, GERD, diastolic CHF, and presumed CAD.  She was admitted last night with dizziness and chest pain.  1. Chest pain: 9/15 admission with NSTEMI, Cardiolite consistent with LAD-territory infarct.  Since that time, she has had periodic anginal symptoms improved with low dose Imdur.  She had chest pain last night but dizziness was a more prominent part of her presentation. Troponin negative.  I think that she does have coronary disease, but given lack of ischemia on recent Cardiolite would suggest medical management as long as cardiac enzymes negative and no prolonged pain.  - I will increase Imdur to 30 mg daily. - Continue beta blocker.  Given dizziness, I will not increase beta blocker.  - I will add ranolazine 500 mg bid.  This should not be BP-active so hopefully will not potentiate dizziness.  - Continue ASA 81 - We discussed statin, she will try taking Crestor 5 mg qod.  2. Hyperlipidemia: ?Confusion on high dose statin.  Will see if she can tolerate Crestor 5 mg every other day.  She has done fine on Crestor once weekly.  3. Ischemic cardiomyopathy: EF >60% by Cardiolite but 40-45% by echo.  She does not appear particularly volume overloaded on exam.  Would continue po Lasix as per her home regimen.  4. "Dizziness": She did not have a spinning sensation like vertigo, this was more of a lightheaded sensation.  She also had this prior to her 9/15 admission.  I will check orthostatics today.   If she is able to walk without chest pain, probably can go home this afternoon.   Loralie Champagne 01/23/2014 9:31 AM

## 2014-01-23 NOTE — Progress Notes (Signed)
UR completed 

## 2014-02-09 NOTE — Progress Notes (Signed)
PT Late G code entry    01/23/14 1259  PT G-Codes **NOT FOR INPATIENT CLASS**  Functional Assessment Tool Used clinical judgement  Functional Limitation Mobility: Walking and moving around  Mobility: Walking and Moving Around Current Status [phone]) CI  Mobility: Walking and Moving Around Goal Status [phone]) CH  Mobility: Walking and Moving Around Discharge Status [phone]) CI   [facility] PT [phone]

## 2014-02-14 DIAGNOSIS — H35051 Retinal neovascularization, unspecified, right eye: Secondary | ICD-10-CM | POA: Diagnosis not present

## 2014-02-14 DIAGNOSIS — H3532 Exudative age-related macular degeneration: Secondary | ICD-10-CM | POA: Diagnosis not present

## 2014-02-19 ENCOUNTER — Encounter: Payer: Self-pay | Admitting: Internal Medicine

## 2014-02-19 ENCOUNTER — Ambulatory Visit (INDEPENDENT_AMBULATORY_CARE_PROVIDER_SITE_OTHER): Payer: Medicare Other | Admitting: Internal Medicine

## 2014-02-19 VITALS — BP 138/62 | HR 64 | Ht 63.0 in | Wt 121.0 lb

## 2014-02-19 DIAGNOSIS — I472 Ventricular tachycardia: Secondary | ICD-10-CM | POA: Diagnosis not present

## 2014-02-19 DIAGNOSIS — I442 Atrioventricular block, complete: Secondary | ICD-10-CM

## 2014-02-19 DIAGNOSIS — R42 Dizziness and giddiness: Secondary | ICD-10-CM | POA: Diagnosis not present

## 2014-02-19 DIAGNOSIS — Z45018 Encounter for adjustment and management of other part of cardiac pacemaker: Secondary | ICD-10-CM

## 2014-02-19 DIAGNOSIS — I209 Angina pectoris, unspecified: Secondary | ICD-10-CM

## 2014-02-19 DIAGNOSIS — I4729 Other ventricular tachycardia: Secondary | ICD-10-CM

## 2014-02-19 NOTE — Progress Notes (Signed)
Patient Care Team: Jerlyn Ly, MD as PCP - General (Internal Medicine)   HPI  Jamie [name] is a 80 y.o. female  seen in followup for complete heart block for which she is status post pacemaker implantation which is programmed in the VDD mode. She underwent device generator replacement fall 2010    She says that she has some increasing fatigue. In part she related to the lack of exercise. We were also whether is related to her beta blocker.  About a month ago or so, she was diagnosed with macular degeneration and she describes herself as "a miracle" with fast improvement in frightening visual impairment in her right eye.  She underwent catheterization demonstrating no obstructive coronary disease. (August 2012)     She has DOE with severe airflow obstruction; she has been treated with oxygen with much improvement. .  Echocardiogram September 2015 demonstrated interval decrease in LV function 60--40% with significant wall motion abnormalities this is in comparison to May 2014. Troponins were abnormal with 0.46. It was felt that she had a completed LAD infarction and this was confirmed by Myoview scanning    Ranolazine was added to her regime when she discontinued it because of nasal congestion. when she presented with chest pain and dizziness in October 2015. These records were reviewed.  Since October she has been relatively stable. She has no edema or chest pain  Past Medical History  Diagnosis Date  . Complete AV block   . Presence of permanent cardiac pacemaker   . Tachycardia   . Hypertension   . Osteopenia   . Osteoarthritis   . COPD (chronic obstructive pulmonary disease)   . Pacemaker   . Shortness of breath   . Pneumonia   . Asthma   . GERD (gastroesophageal reflux disease)   . Family history of anesthesia complication     SISTER HAS DIFFICULTY WAKING  . UTI (urinary tract infection)   . Chronic combined systolic and diastolic CHF (congestive heart  failure)   . Ischemic cardiomyopathy     a.  Echo (9/15):  EF 40-45%, ant and apical and inferoapical HK, Gr 1 DD, trivial MR, mild TR, PASP 23 mmHg;  b. Myoview (9/15) large scar in mid LAD territory, no peri-infarct ischemia, EF 60%, apical AK; intermediate risk >> patient opted for med Rx  . Carotid stenosis     a. Carotid US (9/15):  Bilateral ICA 1-39%    Past Surgical History  Procedure Laterality Date  . Hernia repair  11/2002  . Eye surgery  10/2004  . Pacemaker placement      Medtronic  . Cholecystectomy      Current Outpatient Prescriptions  Medication Sig Dispense Refill  . albuterol (PROAIR HFA) 108 (90 BASE) MCG/ACT inhaler Inhale 2 puffs into the lungs every 6 (six) hours as needed. For shortness of breath/wheezing    . ALPRAZolam (XANAX) 0.5 MG tablet Take 0.5 mg by mouth at bedtime as needed for sleep. For sleep    . aspirin EC 81 MG tablet Take 81 mg by mouth daily.     . Calcium Carbonate (CALTRATE 600) 1500 MG TABS Take 600 mg by mouth daily.     . cholecalciferol (VITAMIN D) 1000 UNITS tablet Take 1,000 Units by mouth daily.      . Coenzyme Q10 (CO Q 10 PO) Take 1 capsule by mouth daily.    . cyanocobalamin 1000 MCG tablet Take 100 mcg by mouth daily.    Marland Kitchen  furosemide (LASIX) 20 MG tablet Take 1 tablet (20 mg total) by mouth daily. 30 tablet 2  . isosorbide mononitrate (IMDUR) 30 MG 24 hr tablet Take 1 tablet (30 mg total) by mouth daily. 30 tablet 0  . levalbuterol (XOPENEX) 0.63 MG/3ML nebulizer solution Take 0.63 mg by nebulization every 6 (six) hours as needed for shortness of breath.    . loratadine (CLARITIN) 10 MG tablet Take 10 mg by mouth daily.    Marland Kitchen losartan (COZAAR) 25 MG tablet Take 1 tablet (25 mg total) by mouth daily. 30 tablet 11  . Magnesium 250 MG TABS Take 250 mg by mouth daily.     . metoprolol (LOPRESSOR) 50 MG tablet Take 0.5 tablets (25 mg total) by mouth 2 (two) times daily. 30 tablet 1  . Multiple Vitamins-Minerals (EYE VITAMINS PO) Take by  mouth daily.      . nitroGLYCERIN (NITROSTAT) 0.4 MG SL tablet Place 1 tablet (0.4 mg total) under the tongue every 5 (five) minutes as needed for chest pain. 15 tablet 12  . Omega-3 Fatty Acids (FISH OIL) 1200 MG CAPS Take 1,200 mg by mouth daily.     Vladimir Faster Glycol-Propyl Glycol (SYSTANE) 0.4-0.3 % SOLN Place 1 drop into both eyes 2 (two) times daily as needed (dry eyes).    . potassium chloride (K-DUR) 10 MEQ tablet Take 10 mEq by mouth 2 (two) times a week. Tuesday and friday    . rosuvastatin (CRESTOR) 5 MG tablet Take 5 mg by mouth daily.     No current facility-administered medications for this visit.    Allergies  Allergen Reactions  . Ace Inhibitors Cough  . Hydrocodone-Acetaminophen Other (See Comments)    REACTION: GI distress    Review of Systems negative except from HPI and PMH  Physical Exam BP 138/62 mmHg  Pulse 64  Ht 5\' 3"  (1.6 m)  Wt 121 lb (54.885 kg)  BMI 21.44 kg/m2 Well developed and well nourished in no acute distress HENT normal E scleral and icterus clear Neck Supple JVP flat; carotids brisk and full Clear to ausculation Device pocket well healed; without hematoma or erythema.  There is no tethering  Regular rate and rhythm, no murmurs gallops or rub Soft with active bowel sounds No clubbing cyanosis none Edema Alert and oriented, grossly normal motor and sensory function Skin Warm and Dry  ECG demonstrates sinus rhythm with PVCs synchronous pacing  Assessment and  Plan  Coronary artery disease with prior MI  Complete heart block  Pacemaker-Medtronic  Statin intolerance  Dizziness  Improved  Overall Jamie [name] has done very well. She is euvolemic. There is no symptoms of ischemia. We will continue her current medications.  We will have her continue to take statins albeit just 5 mg 3 times a week.  She is on ASA  There data  to suggest that Plavix would be an additional adjunctive benefit although we are now 3 months later. Hold  off on.

## 2014-02-19 NOTE — Patient Instructions (Signed)

## 2014-02-20 ENCOUNTER — Other Ambulatory Visit: Payer: Self-pay | Admitting: Internal Medicine

## 2014-02-20 LAB — MDC_IDC_ENUM_SESS_TYPE_INCLINIC
Battery Impedance: 720 Ohm
Battery Remaining Longevity: 68 mo
Battery Voltage: 2.78 V
Brady Statistic AP VP Percent: 0 %
Brady Statistic AS VS Percent: 0 %
Date Time Interrogation Session: 20151208193911
Lead Channel Impedance Value: 495 Ohm
Lead Channel Impedance Value: 67 Ohm
Lead Channel Pacing Threshold Pulse Width: 0.4 ms
Lead Channel Setting Sensing Sensitivity: 2.8 mV
MDC IDC MSMT LEADCHNL RV PACING THRESHOLD AMPLITUDE: 1 V
MDC IDC SET LEADCHNL RV PACING AMPLITUDE: 2.5 V
MDC IDC SET LEADCHNL RV PACING PULSEWIDTH: 0.4 ms
MDC IDC STAT BRADY AP VS PERCENT: 0 %
MDC IDC STAT BRADY AS VP PERCENT: 100 %

## 2014-02-21 DIAGNOSIS — J449 Chronic obstructive pulmonary disease, unspecified: Secondary | ICD-10-CM | POA: Diagnosis not present

## 2014-02-21 DIAGNOSIS — I10 Essential (primary) hypertension: Secondary | ICD-10-CM | POA: Diagnosis not present

## 2014-02-21 DIAGNOSIS — I2510 Atherosclerotic heart disease of native coronary artery without angina pectoris: Secondary | ICD-10-CM | POA: Diagnosis not present

## 2014-02-21 DIAGNOSIS — Z950 Presence of cardiac pacemaker: Secondary | ICD-10-CM | POA: Diagnosis not present

## 2014-02-23 ENCOUNTER — Other Ambulatory Visit: Payer: Self-pay | Admitting: Internal Medicine

## 2014-02-25 ENCOUNTER — Encounter: Payer: Self-pay | Admitting: Internal Medicine

## 2014-02-27 ENCOUNTER — Other Ambulatory Visit: Payer: Self-pay

## 2014-02-27 MED ORDER — METOPROLOL TARTRATE 50 MG PO TABS: ORAL_TABLET | ORAL | Status: DC

## 2014-03-28 DIAGNOSIS — H3532 Exudative age-related macular degeneration: Secondary | ICD-10-CM | POA: Diagnosis not present

## 2014-03-28 DIAGNOSIS — H35051 Retinal neovascularization, unspecified, right eye: Secondary | ICD-10-CM | POA: Diagnosis not present

## 2014-04-23 DIAGNOSIS — Z6821 Body mass index (BMI) 21.0-21.9, adult: Secondary | ICD-10-CM | POA: Diagnosis not present

## 2014-04-23 DIAGNOSIS — R7301 Impaired fasting glucose: Secondary | ICD-10-CM | POA: Diagnosis not present

## 2014-04-23 DIAGNOSIS — J449 Chronic obstructive pulmonary disease, unspecified: Secondary | ICD-10-CM | POA: Diagnosis not present

## 2014-04-23 DIAGNOSIS — I2510 Atherosclerotic heart disease of native coronary artery without angina pectoris: Secondary | ICD-10-CM | POA: Diagnosis not present

## 2014-04-23 DIAGNOSIS — I5189 Other ill-defined heart diseases: Secondary | ICD-10-CM | POA: Diagnosis not present

## 2014-04-23 DIAGNOSIS — I10 Essential (primary) hypertension: Secondary | ICD-10-CM | POA: Diagnosis not present

## 2014-05-16 DIAGNOSIS — H3532 Exudative age-related macular degeneration: Secondary | ICD-10-CM | POA: Diagnosis not present

## 2014-05-16 DIAGNOSIS — H35051 Retinal neovascularization, unspecified, right eye: Secondary | ICD-10-CM | POA: Diagnosis not present

## 2014-05-21 ENCOUNTER — Ambulatory Visit (INDEPENDENT_AMBULATORY_CARE_PROVIDER_SITE_OTHER): Payer: Medicare Other | Admitting: *Deleted

## 2014-05-21 DIAGNOSIS — I442 Atrioventricular block, complete: Secondary | ICD-10-CM | POA: Diagnosis not present

## 2014-05-21 LAB — MDC_IDC_ENUM_SESS_TYPE_REMOTE
Battery Impedance: 876 Ohm
Battery Remaining Longevity: 61 mo
Brady Statistic AP VP Percent: 0 %
Brady Statistic AP VS Percent: 0 %
Brady Statistic AS VS Percent: 0 %
Date Time Interrogation Session: 20160308135837
Lead Channel Impedance Value: 483 Ohm
Lead Channel Impedance Value: 67 Ohm
Lead Channel Pacing Threshold Amplitude: 1 V
Lead Channel Pacing Threshold Pulse Width: 0.4 ms
Lead Channel Setting Sensing Sensitivity: 2.8 mV
MDC IDC MSMT BATTERY VOLTAGE: 2.78 V
MDC IDC MSMT LEADCHNL RA SENSING INTR AMPL: 0.7 mV
MDC IDC SET LEADCHNL RV PACING AMPLITUDE: 2.5 V
MDC IDC SET LEADCHNL RV PACING PULSEWIDTH: 0.4 ms
MDC IDC STAT BRADY AS VP PERCENT: 100 %

## 2014-05-21 NOTE — Progress Notes (Signed)
Remote pacemaker transmission.   

## 2014-05-30 ENCOUNTER — Encounter: Payer: Self-pay | Admitting: Cardiology

## 2014-06-05 ENCOUNTER — Encounter: Payer: Self-pay | Admitting: Internal Medicine

## 2014-06-13 ENCOUNTER — Encounter: Payer: Self-pay | Admitting: Cardiology

## 2014-07-18 DIAGNOSIS — H3532 Exudative age-related macular degeneration: Secondary | ICD-10-CM | POA: Diagnosis not present

## 2014-08-04 ENCOUNTER — Encounter (HOSPITAL_COMMUNITY): Payer: Self-pay

## 2014-08-04 ENCOUNTER — Emergency Department (HOSPITAL_COMMUNITY): Payer: Medicare Other

## 2014-08-04 ENCOUNTER — Observation Stay (HOSPITAL_COMMUNITY)
Admission: EM | Admit: 2014-08-04 | Discharge: 2014-08-06 | Disposition: A | Discharge status: Home / Self Care | Payer: Medicare Other | Attending: Internal Medicine | Admitting: Internal Medicine

## 2014-08-04 DIAGNOSIS — R61 Generalized hyperhidrosis: Secondary | ICD-10-CM | POA: Diagnosis not present

## 2014-08-04 DIAGNOSIS — R55 Syncope and collapse: Secondary | ICD-10-CM | POA: Diagnosis not present

## 2014-08-04 DIAGNOSIS — Z950 Presence of cardiac pacemaker: Secondary | ICD-10-CM | POA: Insufficient documentation

## 2014-08-04 DIAGNOSIS — I10 Essential (primary) hypertension: Secondary | ICD-10-CM | POA: Diagnosis present

## 2014-08-04 DIAGNOSIS — H538 Other visual disturbances: Secondary | ICD-10-CM | POA: Diagnosis not present

## 2014-08-04 DIAGNOSIS — E785 Hyperlipidemia, unspecified: Secondary | ICD-10-CM | POA: Insufficient documentation

## 2014-08-04 DIAGNOSIS — I442 Atrioventricular block, complete: Secondary | ICD-10-CM | POA: Diagnosis not present

## 2014-08-04 DIAGNOSIS — R42 Dizziness and giddiness: Secondary | ICD-10-CM

## 2014-08-04 DIAGNOSIS — R404 Transient alteration of awareness: Secondary | ICD-10-CM | POA: Diagnosis not present

## 2014-08-04 DIAGNOSIS — Z87891 Personal history of nicotine dependence: Secondary | ICD-10-CM | POA: Diagnosis not present

## 2014-08-04 DIAGNOSIS — J439 Emphysema, unspecified: Secondary | ICD-10-CM | POA: Diagnosis present

## 2014-08-04 DIAGNOSIS — I252 Old myocardial infarction: Secondary | ICD-10-CM | POA: Diagnosis not present

## 2014-08-04 LAB — I-STAT TROPONIN, ED: Troponin i, poc: 0 ng/mL (ref 0.00–0.08)

## 2014-08-04 LAB — CBC WITH DIFFERENTIAL/PLATELET
BASOS PCT: 1 % (ref 0–1)
Basophils Absolute: 0 10*3/uL (ref 0.0–0.1)
EOS ABS: 0.2 10*3/uL (ref 0.0–0.7)
Eosinophils Relative: 2 % (ref 0–5)
HEMATOCRIT: 39.9 % (ref 36.0–46.0)
Hemoglobin: 13.3 g/dL (ref 12.0–15.0)
LYMPHS ABS: 1.4 10*3/uL (ref 0.7–4.0)
Lymphocytes Relative: 16 % (ref 12–46)
MCH: 31.5 pg (ref 26.0–34.0)
MCHC: 33.3 g/dL (ref 30.0–36.0)
MCV: 94.5 fL (ref 78.0–100.0)
Monocytes Absolute: 0.8 10*3/uL (ref 0.1–1.0)
Monocytes Relative: 9 % (ref 3–12)
NEUTROS ABS: 6 10*3/uL (ref 1.7–7.7)
Neutrophils Relative %: 72 % (ref 43–77)
Platelets: 188 10*3/uL (ref 150–400)
RBC: 4.22 MIL/uL (ref 3.87–5.11)
RDW: 13.1 % (ref 11.5–15.5)
WBC: 8.4 10*3/uL (ref 4.0–10.5)

## 2014-08-04 LAB — BASIC METABOLIC PANEL
Anion gap: 9 (ref 5–15)
BUN: 17 mg/dL (ref 6–20)
CHLORIDE: 97 mmol/L — AB (ref 101–111)
CO2: 29 mmol/L (ref 22–32)
Calcium: 9 mg/dL (ref 8.9–10.3)
Creatinine, Ser: 0.94 mg/dL (ref 0.44–1.00)
GFR calc Af Amer: >60 mL/min (ref >60)
GFR, EST NON AFRICAN AMERICAN: 52 mL/min — AB (ref >60)
GLUCOSE: 101 mg/dL — AB (ref 65–99)
POTASSIUM: 4.1 mmol/L (ref 3.5–5.1)
Sodium: 135 mmol/L (ref 135–145)

## 2014-08-04 LAB — CBG MONITORING, ED: Glucose-Capillary: 97 mg/dL (ref 65–99)

## 2014-08-04 LAB — URINALYSIS, ROUTINE W REFLEX MICROSCOPIC
BILIRUBIN URINE: NEGATIVE
GLUCOSE, UA: NEGATIVE mg/dL
Ketones, ur: NEGATIVE mg/dL
NITRITE: NEGATIVE
PH: 5 (ref 5.0–8.0)
PROTEIN: NEGATIVE mg/dL
SPECIFIC GRAVITY, URINE: 1.008 (ref 1.005–1.030)
Urobilinogen, UA: 0.2 mg/dL (ref 0.0–1.0)

## 2014-08-04 LAB — URINE MICROSCOPIC-ADD ON

## 2014-08-04 NOTE — ED Notes (Signed)
Per EMS: Pt was at rest watching television, began experiencing significant dizziness. Denies any N/V or chest pain. Did experience some diaphoresis. Pt took Nitro x3. Pt reports dizziness has since resolved. Hx Pacemaker @ demand rate of 40, MI. HR 70-100.

## 2014-08-04 NOTE — ED Provider Notes (Signed)
CSN: [id number]     Arrival date & time 08/04/14  2002 History   First MD Initiated Contact with Patient 08/04/14 2100     Chief Complaint  Patient presents with  . Near Syncope     (Consider location/radiation/quality/duration/timing/severity/associated sxs/prior Treatment) HPI  Jamie [name] is a(n) 81 y.o. female who presents to the emergency department with chief complaint of dizziness. She has a past medical history of complete heart block, cardiac pacemaker placement, frequent urinary tract infections, history of heart failure, ischemic cardiomyopathy, previous MI, chronic COPD, oxygen dependent. The patient states that today she was sitting at the table when she suddenly became dizzy and diaphoretic. She denies chest pain or shortness of breath. She took a sublingual nitroglycerin every 5 minutes, 3 times. About 15 minutes after taking her last nitroglycerin. Her symptoms resolved, however. Her family had called EMS. She denies any current symptoms. She's had similar events previously. The last event landed her in admission into the hospital where she is found to have had an MI. She is concerned that her pacemaker may be misfiring.    Past Medical History  Diagnosis Date  . Complete AV block   . Presence of permanent cardiac pacemaker   . Tachycardia   . Hypertension   . Osteopenia   . Osteoarthritis   . COPD (chronic obstructive pulmonary disease)   . Pacemaker   . Shortness of breath   . Pneumonia   . Asthma   . GERD (gastroesophageal reflux disease)   . Family history of anesthesia complication     SISTER HAS DIFFICULTY WAKING  . UTI (urinary tract infection)   . Chronic combined systolic and diastolic CHF (congestive heart failure)   . Ischemic cardiomyopathy     a.  Echo (9/15):  EF 40-45%, ant and apical and inferoapical HK, Gr 1 DD, trivial MR, mild TR, PASP 23 mmHg;  b. Myoview (9/15) large scar in mid LAD territory, no peri-infarct ischemia, EF 60%, apical AK;  intermediate risk >> patient opted for med Rx  . Carotid stenosis     a. Carotid US (9/15):  Bilateral ICA 1-39%   Past Surgical History  Procedure Laterality Date  . Hernia repair  11/2002  . Eye surgery  10/2004  . Pacemaker placement      [employer]  . Cholecystectomy     Family History  Problem Relation Age of Onset  . Heart disease Sister   . Breast cancer Sister    History  Substance Use Topics  . Smoking status: Former Smoker -- 1.00 packs/day for 35 years    Types: Cigarettes    Quit date: 03/16/1987  . Smokeless tobacco: Never Used  . Alcohol Use: No   OB History    No data available     Review of Systems  Ten systems reviewed and are negative for acute change, except as noted in the HPI.    Allergies  Ace inhibitors and Hydrocodone-acetaminophen  Home Medications   Prior to Admission medications   Medication Sig Start Date End Date Taking? Authorizing Provider  albuterol (PROAIR HFA) 108 (90 BASE) MCG/ACT inhaler Inhale 2 puffs into the lungs every 6 (six) hours as needed. For shortness of breath/wheezing    Historical Provider, MD  ALPRAZolam Duanne Moron) 0.5 MG tablet Take 0.5 mg by mouth at bedtime as needed for sleep. For sleep    Historical Provider, MD  aspirin EC 81 MG tablet Take 81 mg by mouth daily.     Historical  Provider, MD  Calcium Carbonate (CALTRATE 600) 1500 MG TABS Take 600 mg by mouth daily.     Historical Provider, MD  cholecalciferol (VITAMIN D) 1000 UNITS tablet Take 1,000 Units by mouth daily.      Historical Provider, MD  Coenzyme Q10 (CO Q 10 PO) Take 1 capsule by mouth daily.    Historical Provider, MD  cyanocobalamin 1000 MCG tablet Take 100 mcg by mouth daily.    Historical Provider, MD  furosemide (LASIX) 20 MG tablet Take 1 tablet (20 mg total) by mouth daily. 07/25/12   Ripudeep Krystal Eaton, MD  isosorbide mononitrate (IMDUR) 30 MG 24 hr tablet Take 1 tablet (30 mg total) by mouth daily. 01/23/14   Verlee Monte, MD  levalbuterol (XOPENEX)  0.63 MG/3ML nebulizer solution Take 0.63 mg by nebulization every 6 (six) hours as needed for shortness of breath. 07/25/12   Ripudeep Krystal Eaton, MD  loratadine (CLARITIN) 10 MG tablet Take 10 mg by mouth daily.    Historical Provider, MD  losartan (COZAAR) 25 MG tablet Take 1 tablet (25 mg total) by mouth daily. 12/13/13   Liliane Shi, PA-C  Magnesium 250 MG TABS Take 250 mg by mouth daily.     Historical Provider, MD  metoprolol (LOPRESSOR) 50 MG tablet TAKE HALF TABLET BY MOUTH TWICE DAILY 02/27/14   Deboraha Sprang, MD  Multiple Vitamins-Minerals (EYE VITAMINS PO) Take by mouth daily.      Historical Provider, MD  nitroGLYCERIN (NITROSTAT) 0.4 MG SL tablet Place 1 tablet (0.4 mg total) under the tongue every 5 (five) minutes as needed for chest pain. 11/28/13   Charlynne Cousins, MD  Omega-3 Fatty Acids (FISH OIL) 1200 MG CAPS Take 1,200 mg by mouth daily.     Historical Provider, MD  Polyethyl Glycol-Propyl Glycol (SYSTANE) 0.4-0.3 % SOLN Place 1 drop into both eyes 2 (two) times daily as needed (dry eyes).    Historical Provider, MD  potassium chloride (K-DUR) 10 MEQ tablet Take 10 mEq by mouth 2 (two) times a week. Tuesday and friday    Historical Provider, MD  rosuvastatin (CRESTOR) 5 MG tablet Take 5 mg by mouth daily.    Historical Provider, MD   There were no vitals taken for this visit. Physical Exam  Constitutional: She is oriented to person, place, and time. She appears well-developed and well-nourished. No distress.  HENT:  Head: Normocephalic and atraumatic.  Eyes: Conjunctivae are normal. No scleral icterus.  Neck: Normal range of motion.  Cardiovascular: Normal rate, regular rhythm and normal heart sounds.  Exam reveals no gallop and no friction rub.   No murmur heard. Pulmonary/Chest: Effort normal and breath sounds normal. No respiratory distress.  Abdominal: Soft. Bowel sounds are normal. She exhibits no distension and no mass. There is no tenderness. There is no guarding.   Neurological: She is alert and oriented to person, place, and time.  Skin: Skin is warm and dry. She is not diaphoretic.  Nursing note and vitals reviewed.   ED Course  Procedures (including critical care time) Labs Review Labs Reviewed  BASIC METABOLIC PANEL - Abnormal; Notable for the following:    Chloride 97 (*)    Glucose, Bld 101 (*)    GFR calc non Af Amer 52 (*)    All other components within normal limits  URINALYSIS, ROUTINE W REFLEX MICROSCOPIC - Abnormal; Notable for the following:    Hgb urine dipstick MODERATE (*)    Leukocytes, UA SMALL (*)  All other components within normal limits  URINE MICROSCOPIC-ADD ON - Abnormal; Notable for the following:    Casts HYALINE CASTS (*)    All other components within normal limits  COMPREHENSIVE METABOLIC PANEL - Abnormal; Notable for the following:    Chloride 100 (*)    Glucose, Bld 127 (*)    Calcium 8.8 (*)    Total Protein 5.5 (*)    Albumin 3.3 (*)    All other components within normal limits  HEMOGLOBIN A1C - Abnormal; Notable for the following:    Hgb A1c MFr Bld 5.7 (*)    All other components within normal limits  CREATININE, SERUM - Abnormal; Notable for the following:    GFR calc non Af Amer 50 (*)    GFR calc Af Amer 58 (*)    All other components within normal limits  BASIC METABOLIC PANEL - Abnormal; Notable for the following:    Calcium 8.8 (*)    All other components within normal limits  CBC WITH DIFFERENTIAL/PLATELET  TROPONIN I  TROPONIN I  TROPONIN I  CBC WITH DIFFERENTIAL/PLATELET  LIPID PANEL  URINE RAPID DRUG SCREEN (HOSP PERFORMED)  CBC  GLUCOSE, CAPILLARY  GLUCOSE, CAPILLARY  CBC  GLUCOSE, CAPILLARY  POCT CBG (FASTING - GLUCOSE)-MANUAL ENTRY  I-STAT TROPOININ, ED  CBG MONITORING, ED    Imaging Review No results found.   EKG Interpretation None      MDM   Final diagnoses:  Dizziness  Diaphoresis    Patient EKG unchanged. Her chest x-ray shows hyperinflation secondary  to her COPD without any active acute cardiopulmonary disease. CT head is negative. Negative troponin and labs are reassuring. However, considering her strong history, episode of diaphoresis and dizziness. This she would need a chest pain rule out and further workup. Small leukocytes with rare bacteria and 3-6 white blood cells. Patient's urine will be sent for culture. Patient seen in shared visit with Dr. Wyvonnia Dusky. She appears stable for admission.    Margarita Mail, PA-C 08/13/14 [place], MD 08/14/14 1344

## 2014-08-04 NOTE — ED Notes (Signed)
CBG was 97.

## 2014-08-05 ENCOUNTER — Observation Stay (HOSPITAL_COMMUNITY): Payer: Medicare Other

## 2014-08-05 ENCOUNTER — Encounter (HOSPITAL_COMMUNITY): Payer: Self-pay | Admitting: Internal Medicine

## 2014-08-05 DIAGNOSIS — R61 Generalized hyperhidrosis: Secondary | ICD-10-CM | POA: Diagnosis not present

## 2014-08-05 DIAGNOSIS — R42 Dizziness and giddiness: Secondary | ICD-10-CM | POA: Diagnosis not present

## 2014-08-05 DIAGNOSIS — H538 Other visual disturbances: Principal | ICD-10-CM

## 2014-08-05 DIAGNOSIS — I10 Essential (primary) hypertension: Secondary | ICD-10-CM | POA: Diagnosis not present

## 2014-08-05 DIAGNOSIS — J439 Emphysema, unspecified: Secondary | ICD-10-CM | POA: Diagnosis not present

## 2014-08-05 LAB — TROPONIN I
Troponin I: <0.03 ng/mL (ref <0.031)
Troponin I: <0.03 ng/mL (ref <0.031)
Troponin I: <0.03 ng/mL (ref <0.031)

## 2014-08-05 LAB — CREATININE, SERUM
CREATININE: 0.97 mg/dL (ref 0.44–1.00)
GFR calc non Af Amer: 50 mL/min — ABNORMAL LOW (ref >60)
GFR, EST AFRICAN AMERICAN: 58 mL/min — AB (ref >60)

## 2014-08-05 LAB — GLUCOSE, CAPILLARY
GLUCOSE-CAPILLARY: 85 mg/dL (ref 65–99)
GLUCOSE-CAPILLARY: 84 mg/dL (ref 65–99)
GLUCOSE-CAPILLARY: 74 mg/dL (ref 65–99)

## 2014-08-05 LAB — CBC WITH DIFFERENTIAL/PLATELET
Basophils Absolute: 0 10*3/uL (ref 0.0–0.1)
Basophils Relative: 1 % (ref 0–1)
EOS ABS: 0.1 10*3/uL (ref 0.0–0.7)
EOS PCT: 2 % (ref 0–5)
HCT: 40.3 % (ref 36.0–46.0)
Hemoglobin: 13.1 g/dL (ref 12.0–15.0)
LYMPHS ABS: 1 10*3/uL (ref 0.7–4.0)
LYMPHS PCT: 19 % (ref 12–46)
MCH: 31.2 pg (ref 26.0–34.0)
MCHC: 32.5 g/dL (ref 30.0–36.0)
MCV: 96 fL (ref 78.0–100.0)
MONO ABS: 0.5 10*3/uL (ref 0.1–1.0)
Monocytes Relative: 9 % (ref 3–12)
Neutro Abs: 3.7 10*3/uL (ref 1.7–7.7)
Neutrophils Relative %: 69 % (ref 43–77)
Platelets: 178 10*3/uL (ref 150–400)
RBC: 4.2 MIL/uL (ref 3.87–5.11)
RDW: 13.4 % (ref 11.5–15.5)
WBC: 5.4 10*3/uL (ref 4.0–10.5)

## 2014-08-05 LAB — LIPID PANEL
CHOL/HDL RATIO: 2.4 ratio
CHOLESTEROL: 149 mg/dL (ref 0–200)
HDL: 63 mg/dL (ref >40)
LDL Cholesterol: 80 mg/dL (ref 0–99)
Triglycerides: 30 mg/dL (ref <150)
VLDL: 6 mg/dL (ref 0–40)

## 2014-08-05 LAB — RAPID URINE DRUG SCREEN, HOSP PERFORMED
AMPHETAMINES: NOT DETECTED
Barbiturates: NOT DETECTED
Benzodiazepines: NOT DETECTED
Cocaine: NOT DETECTED
OPIATES: NOT DETECTED
TETRAHYDROCANNABINOL: NOT DETECTED

## 2014-08-05 LAB — COMPREHENSIVE METABOLIC PANEL
ALT: 14 U/L (ref 14–54)
AST: 20 U/L (ref 15–41)
Albumin: 3.3 g/dL — ABNORMAL LOW (ref 3.5–5.0)
Alkaline Phosphatase: 59 U/L (ref 38–126)
Anion gap: 9 (ref 5–15)
BUN: 11 mg/dL (ref 6–20)
CALCIUM: 8.8 mg/dL — AB (ref 8.9–10.3)
CO2: 30 mmol/L (ref 22–32)
Chloride: 100 mmol/L — ABNORMAL LOW (ref 101–111)
Creatinine, Ser: 0.8 mg/dL (ref 0.44–1.00)
GFR calc Af Amer: >60 mL/min (ref >60)
GLUCOSE: 127 mg/dL — AB (ref 65–99)
Potassium: 3.8 mmol/L (ref 3.5–5.1)
SODIUM: 139 mmol/L (ref 135–145)
Total Bilirubin: 0.8 mg/dL (ref 0.3–1.2)
Total Protein: 5.5 g/dL — ABNORMAL LOW (ref 6.5–8.1)

## 2014-08-05 LAB — CBC
HEMATOCRIT: 38.2 % (ref 36.0–46.0)
Hemoglobin: 12.6 g/dL (ref 12.0–15.0)
MCH: 31.3 pg (ref 26.0–34.0)
MCHC: 33 g/dL (ref 30.0–36.0)
MCV: 94.8 fL (ref 78.0–100.0)
Platelets: 170 10*3/uL (ref 150–400)
RBC: 4.03 MIL/uL (ref 3.87–5.11)
RDW: 13.1 % (ref 11.5–15.5)
WBC: 6.5 10*3/uL (ref 4.0–10.5)

## 2014-08-05 MED ORDER — LOSARTAN POTASSIUM 50 MG PO TABS
25.0000 mg | ORAL_TABLET | Freq: Every day | ORAL | Status: DC
  Administered 2014-08-06: 25 mg via ORAL
  Filled 2014-08-05 (×2): qty 1

## 2014-08-05 MED ORDER — SODIUM CHLORIDE 0.9 % IV SOLN
INTRAVENOUS | Status: DC
  Administered 2014-08-05: 03:00:00 via INTRAVENOUS

## 2014-08-05 MED ORDER — CALCIUM CARBONATE 1250 (500 CA) MG PO TABS
1.0000 | ORAL_TABLET | Freq: Every day | ORAL | Status: DC
  Administered 2014-08-05 – 2014-08-06 (×2): 500 mg via ORAL
  Filled 2014-08-05 (×2): qty 1

## 2014-08-05 MED ORDER — ALPRAZOLAM 0.5 MG PO TABS: 0.5000 mg | ORAL_TABLET | Freq: Every evening | ORAL | Status: DC | PRN

## 2014-08-05 MED ORDER — ASPIRIN EC 81 MG PO TBEC
81.0000 mg | DELAYED_RELEASE_TABLET | Freq: Every day | ORAL | Status: DC
  Administered 2014-08-05 – 2014-08-06 (×2): 81 mg via ORAL
  Filled 2014-08-05 (×2): qty 1

## 2014-08-05 MED ORDER — STROKE: EARLY STAGES OF RECOVERY BOOK
Freq: Once | Status: AC
  Administered 2014-08-05: 03:00:00

## 2014-08-05 MED ORDER — METOPROLOL TARTRATE 25 MG PO TABS
25.0000 mg | ORAL_TABLET | Freq: Two times a day (BID) | ORAL | Status: DC
  Administered 2014-08-05 – 2014-08-06 (×3): 25 mg via ORAL
  Filled 2014-08-05 (×4): qty 1

## 2014-08-05 MED ORDER — VITAMIN B-12 1000 MCG PO TABS
1000.0000 ug | ORAL_TABLET | Freq: Every day | ORAL | Status: DC
  Administered 2014-08-05 – 2014-08-06 (×2): 1000 ug via ORAL
  Filled 2014-08-05 (×2): qty 1

## 2014-08-05 MED ORDER — OMEGA-3-ACID ETHYL ESTERS 1 G PO CAPS
1000.0000 mg | ORAL_CAPSULE | Freq: Every day | ORAL | Status: DC
  Administered 2014-08-05 – 2014-08-06 (×2): 1000 mg via ORAL
  Filled 2014-08-05 (×2): qty 1

## 2014-08-05 MED ORDER — ENOXAPARIN SODIUM 30 MG/0.3ML ~~LOC~~ SOLN
30.0000 mg | SUBCUTANEOUS | Status: DC
  Administered 2014-08-05 – 2014-08-06 (×2): 30 mg via SUBCUTANEOUS
  Filled 2014-08-05 (×2): qty 0.3

## 2014-08-05 MED ORDER — POLYVINYL ALCOHOL 1.4 % OP SOLN: 1.0000 [drp] | Freq: Two times a day (BID) | OPHTHALMIC | Status: DC | PRN

## 2014-08-05 MED ORDER — ALBUTEROL SULFATE (2.5 MG/3ML) 0.083% IN NEBU: 3.0000 mL | INHALATION_SOLUTION | Freq: Four times a day (QID) | RESPIRATORY_TRACT | Status: DC | PRN

## 2014-08-05 MED ORDER — POTASSIUM CHLORIDE ER 10 MEQ PO TBCR
10.0000 meq | EXTENDED_RELEASE_TABLET | ORAL | Status: DC
  Administered 2014-08-06: 10 meq via ORAL
  Filled 2014-08-05 (×2): qty 1

## 2014-08-05 MED ORDER — OCUVITE PO TABS
1.0000 | ORAL_TABLET | Freq: Every day | ORAL | Status: DC
  Administered 2014-08-05 – 2014-08-06 (×2): 1 via ORAL
  Filled 2014-08-05 (×2): qty 1

## 2014-08-05 MED ORDER — NITROGLYCERIN 0.4 MG SL SUBL: 0.4000 mg | SUBLINGUAL_TABLET | SUBLINGUAL | Status: DC | PRN

## 2014-08-05 MED ORDER — ISOSORBIDE MONONITRATE ER 30 MG PO TB24
30.0000 mg | ORAL_TABLET | Freq: Every day | ORAL | Status: DC
  Administered 2014-08-05 – 2014-08-06 (×2): 30 mg via ORAL
  Filled 2014-08-05 (×2): qty 1

## 2014-08-05 MED ORDER — FUROSEMIDE 20 MG PO TABS
20.0000 mg | ORAL_TABLET | Freq: Every day | ORAL | Status: DC
  Administered 2014-08-05 – 2014-08-06 (×2): 20 mg via ORAL
  Filled 2014-08-05 (×2): qty 1

## 2014-08-05 MED ORDER — LEVALBUTEROL HCL 0.63 MG/3ML IN NEBU: 0.6300 mg | INHALATION_SOLUTION | Freq: Four times a day (QID) | RESPIRATORY_TRACT | Status: DC | PRN

## 2014-08-05 MED ORDER — MAGNESIUM 200 MG PO TABS
200.0000 mg | ORAL_TABLET | Freq: Every day | ORAL | Status: DC
  Administered 2014-08-05 – 2014-08-06 (×2): 200 mg via ORAL
  Filled 2014-08-05 (×4): qty 1

## 2014-08-05 MED ORDER — LORATADINE 10 MG PO TABS
10.0000 mg | ORAL_TABLET | Freq: Every day | ORAL | Status: DC
  Administered 2014-08-05 – 2014-08-06 (×2): 10 mg via ORAL
  Filled 2014-08-05 (×2): qty 1

## 2014-08-05 MED ORDER — VITAMIN D 1000 UNITS PO TABS
1000.0000 [IU] | ORAL_TABLET | Freq: Every day | ORAL | Status: DC
  Administered 2014-08-05 – 2014-08-06 (×2): 1000 [IU] via ORAL
  Filled 2014-08-05 (×2): qty 1

## 2014-08-05 NOTE — Evaluation (Signed)
Physical Therapy Evaluation Patient Details Name: Jamie [name] MRN: [id number] DOB: 1923/02/27 Today's Date: 08/05/2014   History of Present Illness  Jamie [name] is a 81 y.o. female with a history of complete heart block status post pacemaker placement, systolic heart failure last EF measured was 40-45% in September 2015, history of non-ST elevation MI in September 2015, COPD, hypertension was brought to the ER after patient had a brief episode of blurred vision lasting for around half hour last evening around 6 PM while being with the family  Clinical Impression  Pt is functioning near baseline. Pt lives alone however has a lot of available family support. Suspect pt will be safe to d/c home when medically cleared. Acute PT to follow to address higher level balance activities.    Follow Up Recommendations No PT follow up;Supervision - Intermittent    Equipment Recommendations  3in1 (PT) (tub bench)    Recommendations for Other Services       Precautions / Restrictions Precautions Precautions: Other (comment) Precaution Comments: on 2LO2 via [place] Restrictions Weight Bearing Restrictions: No      Mobility  Bed Mobility               General bed mobility comments: pt sitting up at EOB  Transfers Overall transfer level: Needs assistance Equipment used: Rolling walker (2 wheeled) Transfers: Sit to/from Stand Sit to Stand: Supervision         General transfer comment: good technique'  Ambulation/Gait Ambulation/Gait assistance: Supervision Ambulation Distance (Feet): 150 Feet Assistive device: Rolling walker (2 wheeled) Gait Pattern/deviations: Step-through pattern Gait velocity: normal Gait velocity interpretation: <1.8 ft/sec, indicative of risk for recurrent falls General Gait Details: pt with no episodes of LOB  Stairs            Wheelchair Mobility    Modified Rankin (Stroke Patients Only)       Balance Overall balance assessment: Modified  Independent                                           Pertinent Vitals/Pain Pain Assessment: No/denies pain    Home Living Family/patient expects to be discharged to:: Private residence Living Arrangements: Alone Available Help at Discharge: Family;Available PRN/intermittently Type of Home: House Home Access: Stairs to enter Entrance Stairs-Rails: Right Entrance Stairs-Number of Steps: 2 Home Layout: Two level Home Equipment: Cane - single point      Prior Function Level of Independence: Independent         Comments: Uses cane if out in yard for extended time.     Hand Dominance   Dominant Hand: Right    Extremity/Trunk Assessment   Upper Extremity Assessment: Overall WFL for tasks assessed           Lower Extremity Assessment: Overall WFL for tasks assessed      Cervical / Trunk Assessment: Normal  Communication   Communication: No difficulties  Cognition Arousal/Alertness: Awake/alert Behavior During Therapy: WFL for tasks assessed/performed Overall Cognitive Status: Within Functional Limits for tasks assessed                      General Comments      Exercises        Assessment/Plan    PT Assessment Patient needs continued PT services  PT Diagnosis Difficulty walking   PT Problem List Decreased activity tolerance  PT  Treatment Interventions Balance training;Gait training   PT Goals (Current goals can be found in the Care Plan section) Acute Rehab PT Goals Patient Stated Goal: home asap PT Goal Formulation: With patient Time For Goal Achievement: 08/19/14 Potential to Achieve Goals: Good    Frequency Min 2X/week   Barriers to discharge        Co-evaluation               End of Session Equipment Utilized During Treatment: Gait belt;Oxygen (2LO2) Activity Tolerance: Patient tolerated treatment well Patient left: with call bell/phone within reach;with family/visitor present (sitting EOB) Nurse  Communication: Mobility status (question about O2 tank)    Functional Assessment Tool Used: clinical judement Functional Limitation: Mobility: Walking and moving around Mobility: Walking and Moving Around Current Status (Z6109): At least 1 percent but less than 20 percent impaired, limited or restricted Mobility: Walking and Moving Around Goal Status [phone]): 0 percent impaired, limited or restricted    Time: 1201-1227 PT Time Calculation (min) (ACUTE ONLY): 26 min   Charges:   PT Evaluation $Initial PT Evaluation Tier I: 1 Procedure PT Treatments $Gait Training: 8-22 mins   PT G Codes:   PT G-Codes **NOT FOR INPATIENT CLASS** Functional Assessment Tool Used: clinical judement Functional Limitation: Mobility: Walking and moving around Mobility: Walking and Moving Around Current Status (U9811): At least 1 percent but less than 20 percent impaired, limited or restricted Mobility: Walking and Moving Around Goal Status [phone]): 0 percent impaired, limited or restricted    Kingsley Callander 08/05/2014, 1:39 PM   Kittie Plater, PT, DPT Pager #: [phone] Office #: [phone]

## 2014-08-05 NOTE — Progress Notes (Signed)
Patient ID: Jamie [name], female   DOB: 18-Dec-1922, 80 y.o.   MRN: [id number]  Please note that pt was admitted after midnight, please see earlier admission note by Dr. Hal Hope. Pt admitted for evaluation of sudden onset of dizziness and lightheadedness that pt reported occurred several hours after she has used albuterol inhaler (which apparently she has not used in some time). Pt has known systolic heart failure last EF measured was 40-45% in September 2015, history of non-ST elevation MI in September 2015, COPD. Currently pt is hemodynamically stable and denies any similar symptoms, reports she feels better, blood work is stable and within target range, VSS. CT head is negative and CXR with COPD noted but no acute cardiopulmonary findings. Pacemaker to be interrogated. PT evaluation pending. Plan d/c home tomorrow.  Faye Ramsay, MD  [name] Pager [phone]  If 7PM-7AM, please contact night-coverage [email or web address] Password [email or web address]

## 2014-08-05 NOTE — H&P (Signed)
[name] History and Physical  Jamie [name] PFX:[id number] DOB: 1923-02-16 DOA: 08/04/2014  Referring physician: Ms.Abigail. PCP: Jerlyn Ly, MD  Specialists: Dr. Jens Som.  Chief Complaint: Blurred vision and dizziness.  HPI: Jamie [name] is a 81 y.o. female with a history of complete heart block status post pacemaker placement, systolic heart failure last EF measured was 40-45% in September 2015, history of non-ST elevation MI in September 2015, COPD, hypertension was brought to the ER after patient had a brief episode of blurred vision lasting for around half hour last evening around 6 PM while being with the family. At that time patient also had dizziness and diaphoresis. By the time EMS reached patient's symptoms have resolved. Patient did not have any loss of function of the upper or lower extremity is. Denies any chest pain or shortness of breath. In the ER patient was found to be nonfocal. Cardiac markers were negative chest x-ray was unremarkable and patient has been admitted for further observation. Patient had similar episode in September 2015 when CT head was negative an MRI was unable to be done because patient has pacemaker. Carotid Dopplers were unremarkable. During that admission patient had non-ST elevation MI and stress test was done and cardiology selected conservative management.   Review of Systems: As presented in the history of presenting illness, rest negative.  Past Medical History  Diagnosis Date  . Complete AV block   . Presence of permanent cardiac pacemaker   . Tachycardia   . Hypertension   . Osteopenia   . Osteoarthritis   . COPD (chronic obstructive pulmonary disease)   . Pacemaker   . Shortness of breath   . Pneumonia   . Asthma   . GERD (gastroesophageal reflux disease)   . Family history of anesthesia complication     SISTER HAS DIFFICULTY WAKING  . UTI (urinary tract infection)   . Chronic combined systolic and diastolic CHF (congestive  heart failure)   . Ischemic cardiomyopathy     a.  Echo (9/15):  EF 40-45%, ant and apical and inferoapical HK, Gr 1 DD, trivial MR, mild TR, PASP 23 mmHg;  b. Myoview (9/15) large scar in mid LAD territory, no peri-infarct ischemia, EF 60%, apical AK; intermediate risk >> patient opted for med Rx  . Carotid stenosis     a. Carotid US (9/15):  Bilateral ICA 1-39%   Past Surgical History  Procedure Laterality Date  . Hernia repair  11/2002  . Eye surgery  10/2004  . Pacemaker placement      [employer]  . Cholecystectomy     Social History:  reports that she quit smoking about 27 years ago. Her smoking use included Cigarettes. She has a 35 pack-year smoking history. She has never used smokeless tobacco. She reports that she does not drink alcohol or use illicit drugs. Where does patient live at home. Can patient participate in ADLs? Yes.  Allergies  Allergen Reactions  . Ace Inhibitors Cough  . Hydrocodone-Acetaminophen Other (See Comments)     GI distress    Family History:  Family History  Problem Relation Age of Onset  . Heart disease Sister   . Breast cancer Sister       Prior to Admission medications   Medication Sig Start Date End Date Taking? Authorizing Provider  albuterol (PROAIR HFA) 108 (90 BASE) MCG/ACT inhaler Inhale 2 puffs into the lungs every 6 (six) hours as needed for wheezing or shortness of breath.    Yes Historical Provider,  MD  ALPRAZolam (XANAX) 0.5 MG tablet Take 0.5 mg by mouth at bedtime as needed for sleep.    Yes Historical Provider, MD  aspirin EC 81 MG tablet Take 81 mg by mouth daily.    Yes Historical Provider, MD  beta carotene w/minerals (OCUVITE) tablet Take 1 tablet by mouth daily.   Yes Historical Provider, MD  Calcium Carbonate (CALTRATE 600) 1500 MG TABS Take 600 mg by mouth daily.    Yes Historical Provider, MD  cholecalciferol (VITAMIN D) 1000 UNITS tablet Take 1,000 Units by mouth daily.     Yes Historical Provider, MD  Coenzyme Q10 (CO Q  10 PO) Take 1 capsule by mouth daily.   Yes Historical Provider, MD  cyanocobalamin 1000 MCG tablet Take 1,000 mcg by mouth daily.    Yes Historical Provider, MD  furosemide (LASIX) 20 MG tablet Take 1 tablet (20 mg total) by mouth daily. 07/25/12  Yes Ripudeep Krystal Eaton, MD  isosorbide mononitrate (IMDUR) 30 MG 24 hr tablet Take 1 tablet (30 mg total) by mouth daily. 01/23/14  Yes Verlee Monte, MD  levalbuterol (XOPENEX) 0.63 MG/3ML nebulizer solution Take 0.63 mg by nebulization every 6 (six) hours as needed for shortness of breath. 07/25/12  Yes Ripudeep Krystal Eaton, MD  loratadine (CLARITIN) 10 MG tablet Take 10 mg by mouth daily.   Yes Historical Provider, MD  losartan (COZAAR) 25 MG tablet Take 1 tablet (25 mg total) by mouth daily. 12/13/13  Yes Liliane Shi, PA-C  Magnesium 250 MG TABS Take 250 mg by mouth daily.    Yes Historical Provider, MD  metoprolol (LOPRESSOR) 50 MG tablet TAKE HALF TABLET BY MOUTH TWICE DAILY Patient taking differently: Take 25 mg by mouth 2 (two) times daily.  02/27/14  Yes Deboraha Sprang, MD  nitroGLYCERIN (NITROSTAT) 0.4 MG SL tablet Place 1 tablet (0.4 mg total) under the tongue every 5 (five) minutes as needed for chest pain. 11/28/13  Yes Charlynne Cousins, MD  Omega-3 Fatty Acids (FISH OIL) 1200 MG CAPS Take 1,200 mg by mouth daily.    Yes Historical Provider, MD  Polyethyl Glycol-Propyl Glycol (SYSTANE) 0.4-0.3 % SOLN Place 1 drop into both eyes 2 (two) times daily as needed (dry eyes).   Yes Historical Provider, MD  potassium chloride (K-DUR) 10 MEQ tablet Take 10 mEq by mouth 2 (two) times a week. Tuesday and Friday   Yes Historical Provider, MD    Physical Exam: Filed Vitals:   08/04/14 2200 08/04/14 2215 08/05/14 0023 08/05/14 0113  BP: 144/71 145/48 146/49 153/55  Pulse: 75 68 68 81  Resp: 15 19 19 21   SpO2: 100% 100% 98% 99%     General:  Moderately built and nourished.  Eyes: Anicteric no pallor.  ENT: No discharge from the ears eyes nose or  mouth.  Neck: No mass felt.  Cardiovascular: S1 and S2 heard.  Respiratory: No rhonchi or crepitations.  Abdomen: Soft nontender bowel sounds present.  Skin: No rash.  Musculoskeletal: No edema.  Psychiatric: Appears normal.  Neurologic: Alert awake oriented to time place and person. Moves all extremities 5 x 5. No facial asymmetry. Tongue is midline. PERRLA positive.  Labs on Admission:  Basic Metabolic Panel:  Recent Labs Lab 08/04/14 2115  NA 135  K 4.1  CL 97*  CO2 29  GLUCOSE 101*  BUN 17  CREATININE 0.94  CALCIUM 9.0   Liver Function Tests: No results for input(s): AST, ALT, ALKPHOS, BILITOT, PROT, ALBUMIN in the last  168 hours. No results for input(s): LIPASE, AMYLASE in the last 168 hours. No results for input(s): AMMONIA in the last 168 hours. CBC:  Recent Labs Lab 08/04/14 2115  WBC 8.4  NEUTROABS 6.0  HGB 13.3  HCT 39.9  MCV 94.5  PLT 188   Cardiac Enzymes: No results for input(s): CKTOTAL, CKMB, CKMBINDEX, TROPONINI in the last 168 hours.  BNP (last 3 results) No results for input(s): BNP in the last 8760 hours.  ProBNP (last 3 results)  Recent Labs  01/23/14 0122  PROBNP 687.9*    CBG:  Recent Labs Lab 08/04/14 2203  GLUCAP 97    Radiological Exams on Admission: Dg Chest 2 View  08/04/2014   CLINICAL DATA:  Near syncopal episode.  Former smoker.  EXAM: CHEST  2 VIEW  COMPARISON:  01/22/2014; 11/25/2013  FINDINGS: Grossly unchanged cardiac silhouette and mediastinal contours with atherosclerotic plaque within the thoracic aorta. Stable position of support apparatus. The lungs are hyperexpanded with flattening of the bilaterally diaphragms a mild diffuse slightly nodular thickening of the pulmonary interstitium. No focal airspace opacities. No pleural effusion or pneumothorax. No evidence of edema. No acute osseus abnormalities. Mild scoliotic curvature of the thoracolumbar spine, unchanged. Post cholecystectomy.  IMPRESSION:  Hyperexpanded lungs without acute cardiopulmonary disease.   Electronically Signed   By: Sandi Mariscal M.D.   On: 08/04/2014 21:36    EKG: Independently reviewed. Paced rhythm.  Assessment/Plan Principal Problem:   Blurred vision Active Problems:   COPD (chronic obstructive pulmonary disease) with emphysema   Dizziness   Hypertension   1. Blurred vision - essentially resolved at this time. Concerning for TIA. I have discussed with Dr. Wallie Char on-call neurologist who has recommended CT head and if negative no further workup. Patient has had unremarkable carotids done last September 2015. Patient will be placed on neuro checks and swallow evaluation and physical therapy consult. On aspirin. 2. Diaphoresis with history of non-ST elevation MI in 2015 - we will cycle cardiac markers. Patient is chest pain-free at this time. On aspirin. 3. Systolic heart failure last year measured in September 2015 was 40-45% with stress Myoview showing more than 60%. Presently appears compensated. Continue present medications. Closely follow intake output and daily weights. 4. Complete heart block status post pacemaker placement - pacemaker interrogation was ordered by ER physician. Results are pending. 5. Hypertension continue present medications. 6. COPD this may not wheezing.   DVT Prophylaxis Lovenox.  Code Status: Full code.  Family Communication: Daughters at the bedside.  Disposition Plan: Admit for observation.    Jeyli Zwicker N. [name] Pager [phone].  If 7PM-7AM, please contact night-coverage [email or web address] Password [email or web address] 08/05/2014, 1:31 AM

## 2014-08-06 DIAGNOSIS — I10 Essential (primary) hypertension: Secondary | ICD-10-CM | POA: Diagnosis not present

## 2014-08-06 DIAGNOSIS — R42 Dizziness and giddiness: Secondary | ICD-10-CM | POA: Diagnosis not present

## 2014-08-06 DIAGNOSIS — J439 Emphysema, unspecified: Secondary | ICD-10-CM | POA: Diagnosis not present

## 2014-08-06 DIAGNOSIS — H538 Other visual disturbances: Secondary | ICD-10-CM | POA: Diagnosis not present

## 2014-08-06 DIAGNOSIS — E785 Hyperlipidemia, unspecified: Secondary | ICD-10-CM | POA: Diagnosis not present

## 2014-08-06 LAB — CBC
HEMATOCRIT: 39.2 % (ref 36.0–46.0)
Hemoglobin: 13 g/dL (ref 12.0–15.0)
MCH: 31.8 pg (ref 26.0–34.0)
MCHC: 33.2 g/dL (ref 30.0–36.0)
MCV: 95.8 fL (ref 78.0–100.0)
Platelets: 170 10*3/uL (ref 150–400)
RBC: 4.09 MIL/uL (ref 3.87–5.11)
RDW: 13.3 % (ref 11.5–15.5)
WBC: 5.9 10*3/uL (ref 4.0–10.5)

## 2014-08-06 LAB — BASIC METABOLIC PANEL
Anion gap: 7 (ref 5–15)
BUN: 9 mg/dL (ref 6–20)
CALCIUM: 8.8 mg/dL — AB (ref 8.9–10.3)
CO2: 30 mmol/L (ref 22–32)
Chloride: 101 mmol/L (ref 101–111)
Creatinine, Ser: 0.73 mg/dL (ref 0.44–1.00)
GFR calc Af Amer: >60 mL/min (ref >60)
GFR calc non Af Amer: >60 mL/min (ref >60)
Glucose, Bld: 93 mg/dL (ref 65–99)
POTASSIUM: 4.4 mmol/L (ref 3.5–5.1)
SODIUM: 138 mmol/L (ref 135–145)

## 2014-08-06 LAB — HEMOGLOBIN A1C
HEMOGLOBIN A1C: 5.7 % — AB (ref 4.8–5.6)
MEAN PLASMA GLUCOSE: 117 mg/dL

## 2014-08-06 NOTE — Discharge Instructions (Signed)
Dizziness   Dizziness means you feel unsteady or lightheaded. You might feel like you are going to pass out (faint).  HOME CARE   · Drink enough fluids to keep your pee (urine) clear or pale yellow.  · Take your medicines exactly as told by your doctor. If you take blood pressure medicine, always stand up slowly from the lying or sitting position. Hold on to something to steady yourself.  · If you need to stand in one place for a long time, move your legs often. Tighten and relax your leg muscles.  · Have someone stay with you until you feel okay.  · Do not drive or use heavy machinery if you feel dizzy.  · Do not drink alcohol.  GET HELP RIGHT AWAY IF:   · You feel dizzy or lightheaded and it gets worse.  · You feel sick to your stomach (nauseous), or you throw up (vomit).  · You have trouble talking or walking.  · You feel weak or have trouble using your arms, hands, or legs.  · You cannot think clearly or have trouble forming sentences.  · You have chest pain, belly (abdominal) pain, sweating, or you are short of breath.  · Your vision changes.  · You are bleeding.  · You have problems from your medicine that seem to be getting worse.  MAKE SURE YOU:   · Understand these instructions.  · Will watch your condition.  · Will get help right away if you are not doing well or get worse.  Document Released: 02/18/2011 Document Revised: 05/24/2011 Document Reviewed: 02/18/2011  Exit[employer]® Patient Information ©2015 [employer]. This information is not intended to replace advice given to you by your health care provider. Make sure you discuss any questions you have with your health care provider.

## 2014-08-06 NOTE — Discharge Summary (Signed)
Physician Discharge Summary  Jamie [name] SEG:[id number] DOB: 14-Jan-1923 DOA: 08/04/2014  PCP: Jerlyn Ly, MD  Admit date: 08/04/2014 Discharge date: 08/06/2014  Recommendations for Outpatient Follow-up:  1. There are 4 blood pressure medications you are taking currently: Metoprolol, losartan, imdur and Lasix. Blood pressure goal as we discussed 135/85 and can tolerate up to 150/90. Please check your BP before taking medications to make sure it is stable.  Discharge Diagnoses:  Principal Problem:   Blurred vision Active Problems:   COPD (chronic obstructive pulmonary disease) with emphysema   Dizziness   Hypertension    Discharge Condition: stable   Diet recommendation: as tolerated   History of present illness:  81 y.o. female with a history of complete heart block status post pacemaker placement, systolic heart failure (last EF measured was 40-45% in September 2015), history of non-ST elevation MI in September 2015, COPD, hypertension who presented to ED with dizziness and blurry vision started the night prior to the admission and lasting for 30 minutes approximately. Symptoms have spontaneously resolved.No loss of upper or lower extremity function.  CT head and CXR on admission were unremarkable. Pt's daughter insists on pt going home today since all her symptoms have resolved at this point.    Hospital Course:  Principal Problem:   Blurred vision / Dizziness - Possible dehydration, vasovagal episode since pt is on multiple meds and her daughter says that patient's BP fluctuates from low to high throughout the day  - CT head was negative - CXR was unremarkable for acute findings - Symptoms have completely resolved at this point - Pt declined physical therapy evaluation.   Active Problems:   COPD (chronic obstructive pulmonary disease) with emphysema - Stable. No exacerbation    Essential hypertension - Allow higher BP 150/90 goal - May resume home meds but instructed  to measure BP prior to taking BP meds  - Since pt on lasix, she will continue potassium supplementation     Dyslipidemia - Continue omega 3 supplementation    Signed:  Leisa Lenz, MD  [name] 08/06/2014, 9:27 AM  Pager #: [phone]  Time spent in minutes: more than 30 minutes   Discharge Exam: Filed Vitals:   08/06/14 0924  BP: 163/64  Pulse: 72  Temp: 98 F (36.7 C)  Resp: 16   Filed Vitals:   08/05/14 2114 08/06/14 0131 08/06/14 0621 08/06/14 0924  BP: 162/61 142/55 168/64 163/64  Pulse: 71 62 67 72  Temp: 98.4 F (36.9 C) 98.2 F (36.8 C) 98.2 F (36.8 C) 98 F (36.7 C)  TempSrc: Oral Oral Oral Oral  Resp: 17 17 18 16   Height:      Weight:      SpO2: 100% 98% 99% 97%    General: Pt is alert, follows commands appropriately, not in acute distress Cardiovascular: Regular rate and rhythm, S1/S2 +, no murmurs Respiratory: Clear to auscultation bilaterally, no wheezing, no crackles, no rhonchi Abdominal: Soft, non tender, non distended, bowel sounds +, no guarding Extremities: no edema, no cyanosis, pulses palpable bilaterally DP and PT Neuro: Grossly nonfocal  Discharge Instructions  Discharge Instructions    Call MD for:  difficulty breathing, headache or visual disturbances    Complete by:  As directed      Call MD for:  persistant nausea and vomiting    Complete by:  As directed      Call MD for:  severe uncontrolled pain    Complete by:  As directed  Diet - low sodium heart healthy    Complete by:  As directed      Discharge instructions    Complete by:  As directed   1. There are 4 blood pressure medications you are taking currently: Metoprolol, losartan, imdur and Lasix. Blood pressure goal as we discussed 135/85 and can tolerate up to 150/90. Please check your BP before taking medications to make sure it is stable.     Increase activity slowly    Complete by:  As directed             Medication List    TAKE these  medications        ALPRAZolam 0.5 MG tablet  Commonly known as:  XANAX  Take 0.5 mg by mouth at bedtime as needed for sleep.     aspirin EC 81 MG tablet  Take 81 mg by mouth daily.     beta carotene w/minerals tablet  Take 1 tablet by mouth daily.     CALTRATE 600 1500 (600 CA) MG Tabs  Generic drug:  Calcium Carbonate  Take 600 mg by mouth daily.     cholecalciferol 1000 UNITS tablet  Commonly known as:  VITAMIN D  Take 1,000 Units by mouth daily.     CO Q 10 PO  Take 1 capsule by mouth daily.     cyanocobalamin 1000 MCG tablet  Take 1,000 mcg by mouth daily.     Fish Oil 1200 MG Caps  Take 1,200 mg by mouth daily.     furosemide 20 MG tablet  Commonly known as:  LASIX  Take 1 tablet (20 mg total) by mouth daily.     isosorbide mononitrate 30 MG 24 hr tablet  Commonly known as:  IMDUR  Take 1 tablet (30 mg total) by mouth daily.     levalbuterol 0.63 MG/3ML nebulizer solution  Commonly known as:  XOPENEX  Take 0.63 mg by nebulization every 6 (six) hours as needed for shortness of breath.     loratadine 10 MG tablet  Commonly known as:  CLARITIN  Take 10 mg by mouth daily.     losartan 25 MG tablet  Commonly known as:  COZAAR  Take 1 tablet (25 mg total) by mouth daily.     Magnesium 250 MG Tabs  Take 250 mg by mouth daily.     metoprolol 50 MG tablet  Commonly known as:  LOPRESSOR  TAKE HALF TABLET BY MOUTH TWICE DAILY     nitroGLYCERIN 0.4 MG SL tablet  Commonly known as:  NITROSTAT  Place 1 tablet (0.4 mg total) under the tongue every 5 (five) minutes as needed for chest pain.     potassium chloride 10 MEQ tablet  Commonly known as:  K-DUR  Take 10 mEq by mouth 2 (two) times a week. Tuesday and Friday     PROAIR HFA 108 (90 BASE) MCG/ACT inhaler  Generic drug:  albuterol  Inhale 2 puffs into the lungs every 6 (six) hours as needed for wheezing or shortness of breath.     SYSTANE 0.4-0.3 % Soln  Generic drug:  Polyethyl Glycol-Propyl Glycol   Place 1 drop into both eyes 2 (two) times daily as needed (dry eyes).           Follow-up Information    Follow up with PERINI,MARK A, MD. Schedule an appointment as soon as possible for a visit in 1 week.   Specialty:  Internal Medicine   Why:  Follow up  appt after recent hospitalization   Contact information:   [place] [id number] [phone]        The results of significant diagnostics from this hospitalization (including imaging, microbiology, ancillary and laboratory) are listed below for reference.    Significant Diagnostic Studies: Dg Chest 2 View  08/04/2014   CLINICAL DATA:  Near syncopal episode.  Former smoker.  EXAM: CHEST  2 VIEW  COMPARISON:  01/22/2014; 11/25/2013  FINDINGS: Grossly unchanged cardiac silhouette and mediastinal contours with atherosclerotic plaque within the thoracic aorta. Stable position of support apparatus. The lungs are hyperexpanded with flattening of the bilaterally diaphragms a mild diffuse slightly nodular thickening of the pulmonary interstitium. No focal airspace opacities. No pleural effusion or pneumothorax. No evidence of edema. No acute osseus abnormalities. Mild scoliotic curvature of the thoracolumbar spine, unchanged. Post cholecystectomy.  IMPRESSION: Hyperexpanded lungs without acute cardiopulmonary disease.   Electronically Signed   By: Sandi Mariscal M.D.   On: 08/04/2014 21:36   Ct Head Wo Contrast  08/05/2014   CLINICAL DATA:  Dizziness while watching television.  EXAM: CT HEAD WITHOUT CONTRAST  TECHNIQUE: Contiguous axial images were obtained from the base of the skull through the vertex without intravenous contrast.  COMPARISON:  11/25/2013  FINDINGS: Skull and Sinuses:Negative for fracture or destructive process. The mastoids, middle ears, and imaged paranasal sinuses are clear.  Orbits: No acute abnormality.  Bilateral cataract resection.  Brain: No evidence of acute infarction, hemorrhage, hydrocephalus, or mass  lesion/mass effect. Faint densities in the central and left pons is likely mineralization, both less conspicuous on thin section imaging. There is generalized cerebral volume loss which is typical for age. No white matter disease.  IMPRESSION: Negative head CT.   Electronically Signed   By: Monte Fantasia M.D.   On: 08/05/2014 02:41    Microbiology: No results found for this or any previous visit (from the past 240 hour(s)).   Labs: Basic Metabolic Panel:  Recent Labs Lab 08/04/14 2115 08/05/14 0312 08/05/14 0913 08/06/14 0540  NA 135  --  139 138  K 4.1  --  3.8 4.4  CL 97*  --  100* 101  CO2 29  --  30 30  GLUCOSE 101*  --  127* 93  BUN 17  --  11 9  CREATININE 0.94 0.97 0.80 0.73  CALCIUM 9.0  --  8.8* 8.8*   Liver Function Tests:  Recent Labs Lab 08/05/14 0913  AST 20  ALT 14  ALKPHOS 59  BILITOT 0.8  PROT 5.5*  ALBUMIN 3.3*   No results for input(s): LIPASE, AMYLASE in the last 168 hours. No results for input(s): AMMONIA in the last 168 hours. CBC:  Recent Labs Lab 08/04/14 2115 08/05/14 0312 08/05/14 0913 08/06/14 0540  WBC 8.4 6.5 5.4 5.9  NEUTROABS 6.0  --  3.7  --   HGB 13.3 12.6 13.1 13.0  HCT 39.9 38.2 40.3 39.2  MCV 94.5 94.8 96.0 95.8  PLT 188 170 178 170   Cardiac Enzymes:  Recent Labs Lab 08/05/14 0312 08/05/14 0913 08/05/14 1508  TROPONINI <0.03 <0.03 <0.03   BNP: BNP (last 3 results) No results for input(s): BNP in the last 8760 hours.  ProBNP (last 3 results)  Recent Labs  01/23/14 0122  PROBNP 687.9*    CBG:  Recent Labs Lab 08/04/14 2203 08/05/14 1139 08/05/14 1639 08/05/14 2117  GLUCAP 97 85 84 74

## 2014-08-06 NOTE — Progress Notes (Signed)
Patient left unit in a wheelchair with all belonging.

## 2014-08-06 NOTE — Progress Notes (Signed)
Patient is being d/c home. D/c instruction given to patient and caregiver. Both patient and caregiver verbalized understanding. Condition stable.

## 2014-08-22 ENCOUNTER — Ambulatory Visit (INDEPENDENT_AMBULATORY_CARE_PROVIDER_SITE_OTHER): Payer: Medicare Other | Admitting: *Deleted

## 2014-08-22 DIAGNOSIS — I442 Atrioventricular block, complete: Secondary | ICD-10-CM | POA: Diagnosis not present

## 2014-08-22 NOTE — Progress Notes (Signed)
Remote pacemaker transmission.   

## 2014-08-28 LAB — CUP PACEART REMOTE DEVICE CHECK
Battery Impedance: 982 Ohm
Brady Statistic AP VP Percent: 0 %
Brady Statistic AP VS Percent: 0 %
Brady Statistic AS VP Percent: 100 %
Brady Statistic AS VS Percent: 0 %
Lead Channel Impedance Value: 480 Ohm
Lead Channel Impedance Value: 67 Ohm
Lead Channel Pacing Threshold Amplitude: 1 V
Lead Channel Sensing Intrinsic Amplitude: 0.7 mV
Lead Channel Setting Pacing Pulse Width: 0.46 ms
Lead Channel Setting Sensing Sensitivity: 2.8 mV
MDC IDC MSMT BATTERY REMAINING LONGEVITY: 56 mo
MDC IDC MSMT BATTERY VOLTAGE: 2.77 V
MDC IDC MSMT LEADCHNL RV PACING THRESHOLD PULSEWIDTH: 0.4 ms
MDC IDC SESS DTM: 20160609111128
MDC IDC SET LEADCHNL RV PACING AMPLITUDE: 2.5 V

## 2014-09-04 DIAGNOSIS — R42 Dizziness and giddiness: Secondary | ICD-10-CM | POA: Diagnosis not present

## 2014-09-04 DIAGNOSIS — I10 Essential (primary) hypertension: Secondary | ICD-10-CM | POA: Diagnosis not present

## 2014-09-04 DIAGNOSIS — Z6820 Body mass index (BMI) 20.0-20.9, adult: Secondary | ICD-10-CM | POA: Diagnosis not present

## 2014-09-04 DIAGNOSIS — I2510 Atherosclerotic heart disease of native coronary artery without angina pectoris: Secondary | ICD-10-CM | POA: Diagnosis not present

## 2014-09-04 DIAGNOSIS — Z950 Presence of cardiac pacemaker: Secondary | ICD-10-CM | POA: Diagnosis not present

## 2014-09-09 ENCOUNTER — Other Ambulatory Visit: Payer: Self-pay

## 2014-09-11 ENCOUNTER — Encounter: Payer: Self-pay | Admitting: Cardiology

## 2014-09-13 DIAGNOSIS — H3552 Pigmentary retinal dystrophy: Secondary | ICD-10-CM | POA: Diagnosis not present

## 2014-09-23 ENCOUNTER — Encounter: Payer: Self-pay | Admitting: Internal Medicine

## 2014-10-18 DIAGNOSIS — H3532 Exudative age-related macular degeneration: Secondary | ICD-10-CM | POA: Diagnosis not present

## 2014-11-22 DIAGNOSIS — I10 Essential (primary) hypertension: Secondary | ICD-10-CM | POA: Diagnosis not present

## 2014-11-22 DIAGNOSIS — E785 Hyperlipidemia, unspecified: Secondary | ICD-10-CM | POA: Diagnosis not present

## 2014-11-22 DIAGNOSIS — M859 Disorder of bone density and structure, unspecified: Secondary | ICD-10-CM | POA: Diagnosis not present

## 2014-11-22 DIAGNOSIS — R8299 Other abnormal findings in urine: Secondary | ICD-10-CM | POA: Diagnosis not present

## 2014-11-22 DIAGNOSIS — I2510 Atherosclerotic heart disease of native coronary artery without angina pectoris: Secondary | ICD-10-CM | POA: Diagnosis not present

## 2014-11-22 DIAGNOSIS — R7301 Impaired fasting glucose: Secondary | ICD-10-CM | POA: Diagnosis not present

## 2014-11-25 ENCOUNTER — Encounter: Payer: Self-pay | Admitting: Internal Medicine

## 2014-11-25 ENCOUNTER — Ambulatory Visit (INDEPENDENT_AMBULATORY_CARE_PROVIDER_SITE_OTHER): Payer: Medicare Other | Admitting: *Deleted

## 2014-11-25 DIAGNOSIS — I442 Atrioventricular block, complete: Secondary | ICD-10-CM | POA: Diagnosis not present

## 2014-11-25 NOTE — Progress Notes (Signed)
Remote pacemaker transmission.   

## 2014-11-28 DIAGNOSIS — H3532 Exudative age-related macular degeneration: Secondary | ICD-10-CM | POA: Diagnosis not present

## 2014-12-02 DIAGNOSIS — Z950 Presence of cardiac pacemaker: Secondary | ICD-10-CM | POA: Diagnosis not present

## 2014-12-02 DIAGNOSIS — J45909 Unspecified asthma, uncomplicated: Secondary | ICD-10-CM | POA: Diagnosis not present

## 2014-12-02 DIAGNOSIS — Z0000 Encounter for general adult medical examination without abnormal findings: Secondary | ICD-10-CM | POA: Diagnosis not present

## 2014-12-02 DIAGNOSIS — R809 Proteinuria, unspecified: Secondary | ICD-10-CM | POA: Diagnosis not present

## 2014-12-02 DIAGNOSIS — Z1389 Encounter for screening for other disorder: Secondary | ICD-10-CM | POA: Diagnosis not present

## 2014-12-02 DIAGNOSIS — M810 Age-related osteoporosis without current pathological fracture: Secondary | ICD-10-CM | POA: Diagnosis not present

## 2014-12-02 DIAGNOSIS — R7301 Impaired fasting glucose: Secondary | ICD-10-CM | POA: Diagnosis not present

## 2014-12-02 DIAGNOSIS — I5189 Other ill-defined heart diseases: Secondary | ICD-10-CM | POA: Diagnosis not present

## 2014-12-02 DIAGNOSIS — Z23 Encounter for immunization: Secondary | ICD-10-CM | POA: Diagnosis not present

## 2014-12-02 DIAGNOSIS — I2510 Atherosclerotic heart disease of native coronary artery without angina pectoris: Secondary | ICD-10-CM | POA: Diagnosis not present

## 2014-12-02 DIAGNOSIS — Z6820 Body mass index (BMI) 20.0-20.9, adult: Secondary | ICD-10-CM | POA: Diagnosis not present

## 2014-12-02 DIAGNOSIS — J449 Chronic obstructive pulmonary disease, unspecified: Secondary | ICD-10-CM | POA: Diagnosis not present

## 2014-12-03 ENCOUNTER — Other Ambulatory Visit: Payer: Self-pay | Admitting: Physician Assistant

## 2014-12-04 LAB — CUP PACEART REMOTE DEVICE CHECK
Battery Impedance: 1035 Ohm
Battery Remaining Longevity: 55 mo
Battery Voltage: 2.77 V
Brady Statistic AS VP Percent: 100 %
Date Time Interrogation Session: 20160912130220
Implantable Lead Implant Date: 20020116
Implantable Lead Location: 753859
Implantable Lead Location: 753860
Implantable Lead Model: 5076
Lead Channel Impedance Value: 467 Ohm
Lead Channel Pacing Threshold Amplitude: 1.125 V
Lead Channel Pacing Threshold Pulse Width: 0.4 ms
Lead Channel Setting Pacing Amplitude: 2.5 V
Lead Channel Setting Pacing Pulse Width: 0.4 ms
Lead Channel Setting Sensing Sensitivity: 2.8 mV
MDC IDC LEAD IMPLANT DT: 20020116
MDC IDC MSMT LEADCHNL RA IMPEDANCE VALUE: 67 Ohm
MDC IDC MSMT LEADCHNL RA SENSING INTR AMPL: 0.7 mV — AB
MDC IDC STAT BRADY AP VP PERCENT: 0 %
MDC IDC STAT BRADY AP VS PERCENT: 0 %
MDC IDC STAT BRADY AS VS PERCENT: 0 %

## 2014-12-11 DIAGNOSIS — Z1212 Encounter for screening for malignant neoplasm of rectum: Secondary | ICD-10-CM | POA: Diagnosis not present

## 2014-12-16 DIAGNOSIS — N390 Urinary tract infection, site not specified: Secondary | ICD-10-CM | POA: Diagnosis not present

## 2014-12-24 ENCOUNTER — Other Ambulatory Visit: Payer: Self-pay | Admitting: Physician Assistant

## 2014-12-26 ENCOUNTER — Encounter: Payer: Self-pay | Admitting: Cardiology

## 2015-01-09 ENCOUNTER — Encounter: Payer: Self-pay | Admitting: Cardiology

## 2015-01-09 DIAGNOSIS — H353211 Exudative age-related macular degeneration, right eye, with active choroidal neovascularization: Secondary | ICD-10-CM | POA: Diagnosis not present

## 2015-01-13 DIAGNOSIS — H353221 Exudative age-related macular degeneration, left eye, with active choroidal neovascularization: Secondary | ICD-10-CM | POA: Diagnosis not present

## 2015-01-24 ENCOUNTER — Other Ambulatory Visit: Payer: Self-pay

## 2015-01-24 MED ORDER — ISOSORBIDE MONONITRATE ER 30 MG PO TB24: 30.0000 mg | ORAL_TABLET | Freq: Every day | ORAL | Status: DC

## 2015-02-17 DIAGNOSIS — H353211 Exudative age-related macular degeneration, right eye, with active choroidal neovascularization: Secondary | ICD-10-CM | POA: Diagnosis not present

## 2015-02-20 DIAGNOSIS — H353221 Exudative age-related macular degeneration, left eye, with active choroidal neovascularization: Secondary | ICD-10-CM | POA: Diagnosis not present

## 2015-02-26 ENCOUNTER — Encounter: Payer: Self-pay | Admitting: Internal Medicine

## 2015-02-26 ENCOUNTER — Ambulatory Visit (INDEPENDENT_AMBULATORY_CARE_PROVIDER_SITE_OTHER): Payer: Medicare Other | Admitting: Internal Medicine

## 2015-02-26 VITALS — BP 124/74 | HR 69 | Ht 63.0 in | Wt 117.2 lb

## 2015-02-26 DIAGNOSIS — Z950 Presence of cardiac pacemaker: Secondary | ICD-10-CM | POA: Diagnosis not present

## 2015-02-26 DIAGNOSIS — I442 Atrioventricular block, complete: Secondary | ICD-10-CM | POA: Diagnosis not present

## 2015-02-26 LAB — CUP PACEART INCLINIC DEVICE CHECK
Battery Remaining Longevity: 49 mo
Brady Statistic AP VS Percent: 0 %
Brady Statistic AS VS Percent: 0 %
Implantable Lead Implant Date: 20020116
Implantable Lead Implant Date: 20020116
Lead Channel Impedance Value: 67 Ohm
Lead Channel Pacing Threshold Amplitude: 0.75 V
Lead Channel Pacing Threshold Pulse Width: 0.4 ms
Lead Channel Pacing Threshold Pulse Width: 0.46 ms
Lead Channel Setting Pacing Pulse Width: 0.46 ms
Lead Channel Setting Sensing Sensitivity: 2.8 mV
MDC IDC LEAD LOCATION: 753859
MDC IDC LEAD LOCATION: 753860
MDC IDC MSMT BATTERY IMPEDANCE: 1194 Ohm
MDC IDC MSMT BATTERY VOLTAGE: 2.77 V
MDC IDC MSMT LEADCHNL RV IMPEDANCE VALUE: 472 Ohm
MDC IDC MSMT LEADCHNL RV PACING THRESHOLD AMPLITUDE: 1 V
MDC IDC SESS DTM: 20161214144029
MDC IDC SET LEADCHNL RV PACING AMPLITUDE: 2.5 V
MDC IDC STAT BRADY AP VP PERCENT: 0 %
MDC IDC STAT BRADY AS VP PERCENT: 100 %

## 2015-02-26 NOTE — Progress Notes (Signed)
Patient Care Team: Crist Infante, MD as PCP - General (Internal Medicine)   HPI  Jamie [name] is a 81 y.o. female  seen in followup for complete heart block for which she is status post pacemaker implantation which is programmed in the VDD mode. She underwent device generator replacement fall 2010    She says that she has some increasing fatigue. In part she related to the lack of exercise. We were also whether is related to her beta blocker.  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  She underwent catheterization demonstrating no obstructive coronary disease. (August 2012)     She has DOE with severe airflow obstruction; she has been treated with oxygen with much improvement.  This has been stable without chest pain or edema  She was hospitalized 5/16 for an episode of dizziness. She had another episode a couple of months later that was similar and short lived. Blood pressures were reasonable with these episodes. There were no arrhythmias. .  Echocardiogram September 2015 demonstrated interval decrease in LV function 60--40% with significant wall motion abnormalities this is in comparison to May 2014. Troponins were abnormal with 0.46. It was felt that she had a completed LAD infarction and this was confirmed by Myoview scanning    Past Medical History  Diagnosis Date  . Complete AV block ([place])   . Presence of permanent cardiac pacemaker   . Tachycardia   . Hypertension   . Osteopenia   . Osteoarthritis   . COPD (chronic obstructive pulmonary disease) ([place])   . Pacemaker   . Shortness of breath   . Pneumonia   . Asthma   . GERD (gastroesophageal reflux disease)   . Family history of anesthesia complication     SISTER HAS DIFFICULTY WAKING  . UTI (urinary tract infection)   . Chronic combined systolic and diastolic CHF (congestive  heart failure) ([place])   . Ischemic cardiomyopathy     a.  Echo (9/15):  EF 40-45%, ant and apical and inferoapical HK, Gr 1 DD, trivial MR, mild TR, PASP 23 mmHg;  b. Myoview (9/15) large scar in mid LAD territory, no peri-infarct ischemia, EF 60%, apical AK; intermediate risk >> patient opted for med Rx  . Carotid stenosis     a. Carotid US (9/15):  Bilateral ICA 1-39%    Past Surgical History  Procedure Laterality Date  . Hernia repair  11/2002  . Eye surgery  10/2004  . Pacemaker placement      Medtronic  . Cholecystectomy      Current Outpatient Prescriptions  Medication Sig Dispense Refill  . albuterol (PROAIR HFA) 108 (90 BASE) MCG/ACT inhaler Inhale 2 puffs into the lungs every 6 (six) hours as needed for wheezing or shortness of breath.     . ALPRAZolam (XANAX) 0.5 MG tablet Take 0.5 mg by mouth at bedtime as needed for sleep.     Marland Kitchen aspirin EC 81 MG tablet Take 81 mg by mouth daily.     . beta carotene w/minerals (OCUVITE) tablet Take 1 tablet by mouth daily.    . Calcium Carbonate (CALTRATE 600) 1500 MG TABS Take 600 mg by mouth daily.     . cholecalciferol (VITAMIN D) 1000 UNITS tablet Take 1,000 Units by mouth daily.      . Coenzyme Q10 (CO Q 10 PO)  Take 1 capsule by mouth daily.    . cyanocobalamin 1000 MCG tablet Take 1,000 mcg by mouth daily.     . furosemide (LASIX) 20 MG tablet Take 1 tablet (20 mg total) by mouth daily. 30 tablet 2  . isosorbide mononitrate (IMDUR) 30 MG 24 hr tablet Take 1 tablet (30 mg total) by mouth daily. 30 tablet 3  . levalbuterol (XOPENEX) 0.63 MG/3ML nebulizer solution Take 0.63 mg by nebulization every 6 (six) hours as needed for shortness of breath.    . losartan (COZAAR) 25 MG tablet TAKE ONE TABLET BY MOUTH ONE TIME DAILY 30 tablet 6  . Magnesium 250 MG TABS Take 250 mg by mouth daily.     . metoprolol (LOPRESSOR) 50 MG tablet TAKE HALF TABLET BY MOUTH TWICE DAILY (Patient taking differently: Take 25 mg by mouth 2 (two) times daily. ) 30  tablet 6  . nitroGLYCERIN (NITROSTAT) 0.4 MG SL tablet Place 1 tablet (0.4 mg total) under the tongue every 5 (five) minutes as needed for chest pain. 15 tablet 12  . Omega-3 Fatty Acids (FISH OIL) 1200 MG CAPS Take 1,200 mg by mouth daily.     Vladimir Faster Glycol-Propyl Glycol (SYSTANE) 0.4-0.3 % SOLN Place 1 drop into both eyes 2 (two) times daily as needed (dry eyes).    . potassium chloride (K-DUR) 10 MEQ tablet Take 10 mEq by mouth 2 (two) times a week. Tuesday and Friday     No current facility-administered medications for this visit.    Allergies  Allergen Reactions  . Ace Inhibitors Cough  . Hydrocodone-Acetaminophen Other (See Comments)     GI distress    Review of Systems negative except from HPI and PMH  Physical Exam BP 124/74 mmHg  Pulse 69  Ht 5\' 3"  (1.6 m)  Wt 117 lb 3.2 oz (53.162 kg)  BMI 20.77 kg/m2 Well developed and well nourished in no acute distress HENT normal E scleral and icterus clear Neck Supple JVP flat; carotids brisk and full Clear to ausculation Device pocket well healed; without hematoma or erythema.  There is no tethering  Regular rate and rhythm, no murmurs gallops or rub Soft with active bowel sounds No clubbing cyanosis none Edema Alert and oriented, grossly normal motor and sensory function Skin Warm and Dry  ECG demonstrates sinus rhythm with P   synchronous pacing  Assessment and  Plan  Coronary artery disease with prior MI  Complete heart block  Pacemaker-Medtronic  Statin intolerance   Overall Jamie [name] has done very well. She is euvolemic. There is no symptoms of ischemia. We will continue her current medications.  I'm not sure of the mechanism of her episodes of dizziness. There were associated with elevated blood pressure albeit not significantly.

## 2015-02-26 NOTE — Patient Instructions (Signed)
Medication Instructions: - no changes  Labwork: - none  Procedures/Testing: - none  Follow-Up: - Remote monitoring is used to monitor your Pacemaker of ICD from home. This monitoring reduces the number of office visits required to check your device to one time per year. It allows [place] to keep an eye on the functioning of your device to ensure it is working properly. You are scheduled for a device check from home on 05/28/15. You may send your transmission at any time that day. If you have a wireless device, the transmission will be sent automatically. After your physician reviews your transmission, you will receive a postcard with your next transmission date.  - Your physician wants you to follow-up in: 1 year with Dr. Caryl Comes. You will receive a reminder letter in the mail two months in advance. If you don't receive a letter, please call our office to schedule the follow-up appointment.  Any Additional Special Instructions Will Be Listed Below (If Applicable).

## 2015-03-24 ENCOUNTER — Other Ambulatory Visit: Payer: Self-pay | Admitting: Internal Medicine

## 2015-04-01 ENCOUNTER — Encounter: Payer: Self-pay | Admitting: Cardiology

## 2015-04-07 DIAGNOSIS — H353221 Exudative age-related macular degeneration, left eye, with active choroidal neovascularization: Secondary | ICD-10-CM | POA: Diagnosis not present

## 2015-04-07 DIAGNOSIS — H353131 Nonexudative age-related macular degeneration, bilateral, early dry stage: Secondary | ICD-10-CM | POA: Diagnosis not present

## 2015-04-07 DIAGNOSIS — H353211 Exudative age-related macular degeneration, right eye, with active choroidal neovascularization: Secondary | ICD-10-CM | POA: Diagnosis not present

## 2015-04-10 DIAGNOSIS — H353221 Exudative age-related macular degeneration, left eye, with active choroidal neovascularization: Secondary | ICD-10-CM | POA: Diagnosis not present

## 2015-05-12 DIAGNOSIS — H353131 Nonexudative age-related macular degeneration, bilateral, early dry stage: Secondary | ICD-10-CM | POA: Diagnosis not present

## 2015-05-12 DIAGNOSIS — H353221 Exudative age-related macular degeneration, left eye, with active choroidal neovascularization: Secondary | ICD-10-CM | POA: Diagnosis not present

## 2015-05-12 DIAGNOSIS — H353211 Exudative age-related macular degeneration, right eye, with active choroidal neovascularization: Secondary | ICD-10-CM | POA: Diagnosis not present

## 2015-05-28 ENCOUNTER — Ambulatory Visit (INDEPENDENT_AMBULATORY_CARE_PROVIDER_SITE_OTHER): Payer: Medicare Other | Admitting: *Deleted

## 2015-05-28 DIAGNOSIS — I442 Atrioventricular block, complete: Secondary | ICD-10-CM

## 2015-05-28 DIAGNOSIS — Z950 Presence of cardiac pacemaker: Secondary | ICD-10-CM | POA: Diagnosis not present

## 2015-05-28 LAB — CUP PACEART REMOTE DEVICE CHECK
Battery Impedance: 1359 Ohm
Battery Remaining Longevity: 45 mo
Brady Statistic AP VP Percent: 0 %
Date Time Interrogation Session: 20170315123547
Implantable Lead Implant Date: 20020116
Implantable Lead Location: 753860
Implantable Lead Model: 5076
Lead Channel Impedance Value: 460 Ohm
Lead Channel Impedance Value: 67 Ohm
Lead Channel Sensing Intrinsic Amplitude: 0.7 mV
Lead Channel Setting Pacing Amplitude: 2.5 V
Lead Channel Setting Pacing Pulse Width: 0.4 ms
MDC IDC LEAD IMPLANT DT: 20020116
MDC IDC LEAD LOCATION: 753859
MDC IDC MSMT BATTERY VOLTAGE: 2.77 V
MDC IDC MSMT LEADCHNL RV PACING THRESHOLD AMPLITUDE: 0.75 V
MDC IDC MSMT LEADCHNL RV PACING THRESHOLD PULSEWIDTH: 0.4 ms
MDC IDC SET LEADCHNL RV SENSING SENSITIVITY: 2.8 mV
MDC IDC STAT BRADY AP VS PERCENT: 0 %
MDC IDC STAT BRADY AS VP PERCENT: 100 %
MDC IDC STAT BRADY AS VS PERCENT: 0 %

## 2015-05-28 NOTE — Progress Notes (Signed)
Remote pacemaker transmission.   

## 2015-06-02 DIAGNOSIS — I2510 Atherosclerotic heart disease of native coronary artery without angina pectoris: Secondary | ICD-10-CM | POA: Diagnosis not present

## 2015-06-02 DIAGNOSIS — J449 Chronic obstructive pulmonary disease, unspecified: Secondary | ICD-10-CM | POA: Diagnosis not present

## 2015-06-02 DIAGNOSIS — Z681 Body mass index (BMI) 19 or less, adult: Secondary | ICD-10-CM | POA: Diagnosis not present

## 2015-06-02 DIAGNOSIS — I10 Essential (primary) hypertension: Secondary | ICD-10-CM | POA: Diagnosis not present

## 2015-06-02 DIAGNOSIS — I5189 Other ill-defined heart diseases: Secondary | ICD-10-CM | POA: Diagnosis not present

## 2015-06-02 DIAGNOSIS — Z950 Presence of cardiac pacemaker: Secondary | ICD-10-CM | POA: Diagnosis not present

## 2015-06-09 ENCOUNTER — Encounter: Payer: Self-pay | Admitting: Cardiology

## 2015-06-09 DIAGNOSIS — H353211 Exudative age-related macular degeneration, right eye, with active choroidal neovascularization: Secondary | ICD-10-CM | POA: Diagnosis not present

## 2015-06-09 DIAGNOSIS — H353221 Exudative age-related macular degeneration, left eye, with active choroidal neovascularization: Secondary | ICD-10-CM | POA: Diagnosis not present

## 2015-06-09 IMAGING — CR DG CHEST 2V
2 series · 2 of 2 positions shown · non-contrast
Comparison: 11/25/2013 and 07/23/2012

CLINICAL DATA: Right-sided chest pain with dizziness and weakness.

EXAM:
CHEST  2 VIEW

[w chest pa]
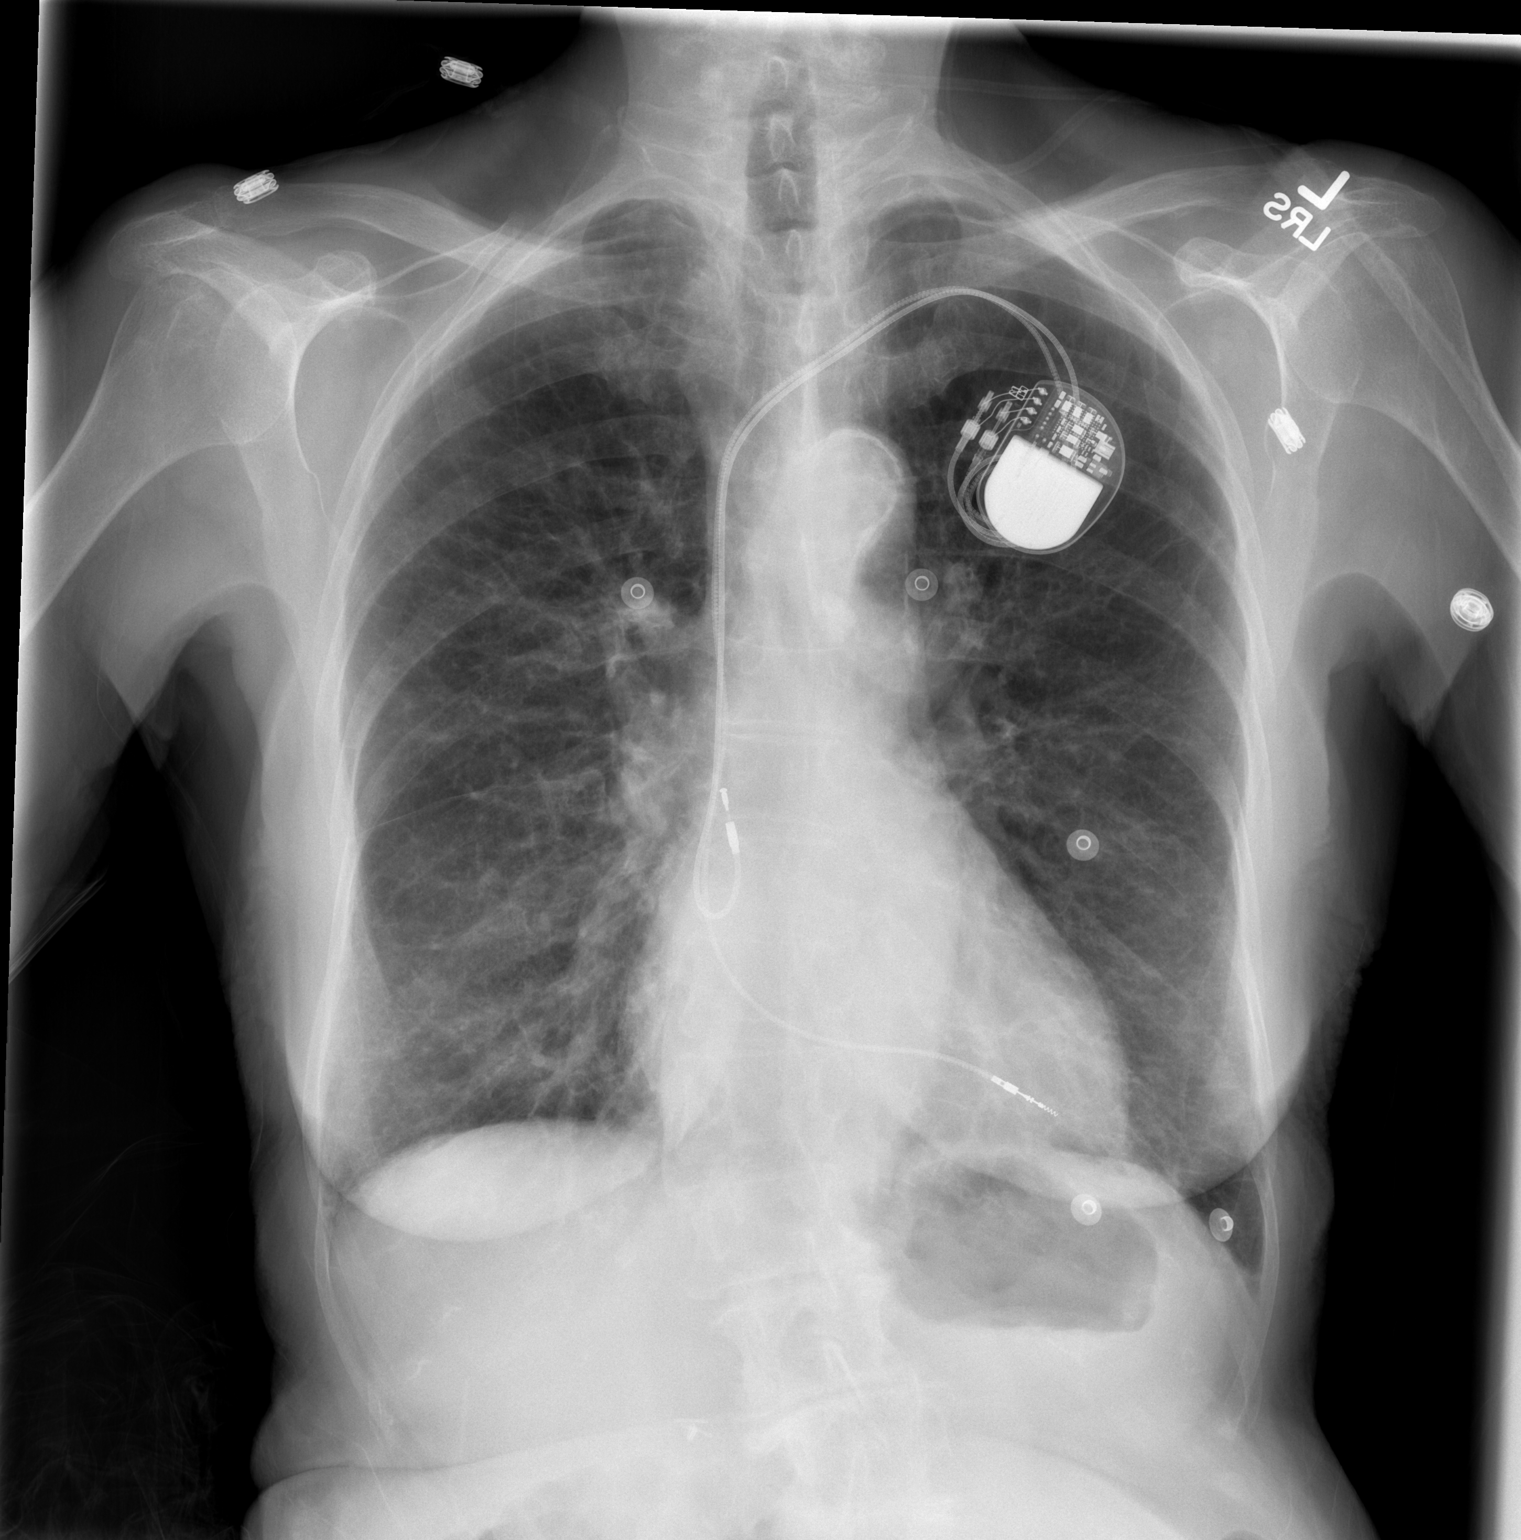

[w chest lat]
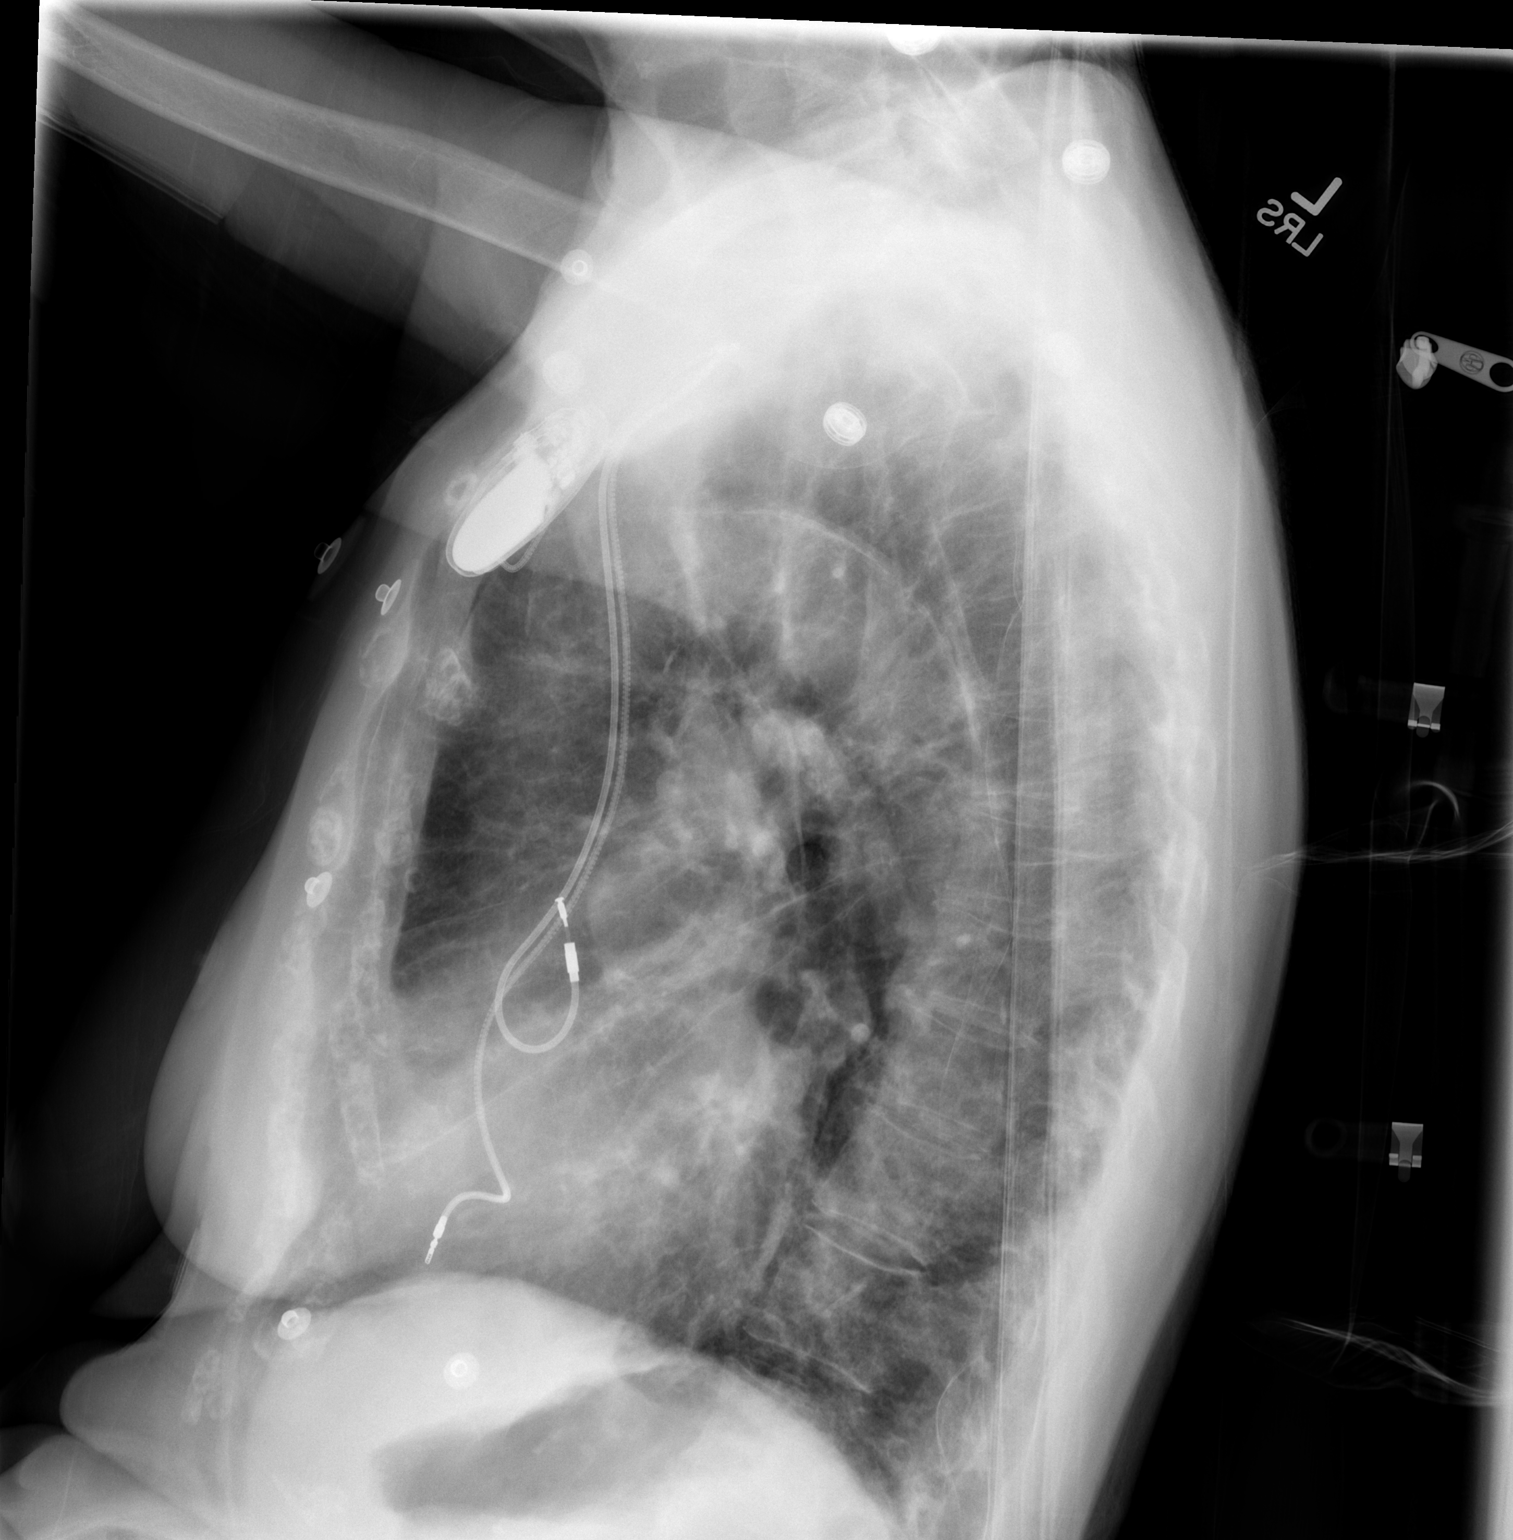

[2 of 2 positions shown; findings below may reference images not displayed]

FINDINGS: Dual lead left-sided pacemaker is unchanged. Lungs are adequately
inflated with subtle bilateral perihilar interstitial prominence. No
focal consolidation or effusion. Cardiomediastinal silhouette is
within normal. There is calcified plaque over the thoracic aorta.
There moderate degenerate changes of the spine with mild curvature
of the thoracic spine convex to the right.
IMPRESSION: Mild bilateral perihilar interstitial prominence which may be due to
mild vascular congestion versus interstitial pneumonia.

## 2015-06-16 DIAGNOSIS — H353211 Exudative age-related macular degeneration, right eye, with active choroidal neovascularization: Secondary | ICD-10-CM | POA: Diagnosis not present

## 2015-06-16 DIAGNOSIS — H353131 Nonexudative age-related macular degeneration, bilateral, early dry stage: Secondary | ICD-10-CM | POA: Diagnosis not present

## 2015-06-16 DIAGNOSIS — H353221 Exudative age-related macular degeneration, left eye, with active choroidal neovascularization: Secondary | ICD-10-CM | POA: Diagnosis not present

## 2015-06-23 ENCOUNTER — Other Ambulatory Visit: Payer: Self-pay | Admitting: Internal Medicine

## 2015-06-24 ENCOUNTER — Encounter: Payer: Self-pay | Admitting: Cardiology

## 2015-06-29 ENCOUNTER — Other Ambulatory Visit: Payer: Self-pay | Admitting: Physician Assistant

## 2015-07-21 DIAGNOSIS — H43392 Other vitreous opacities, left eye: Secondary | ICD-10-CM | POA: Diagnosis not present

## 2015-07-21 DIAGNOSIS — H353211 Exudative age-related macular degeneration, right eye, with active choroidal neovascularization: Secondary | ICD-10-CM | POA: Diagnosis not present

## 2015-07-21 DIAGNOSIS — H353221 Exudative age-related macular degeneration, left eye, with active choroidal neovascularization: Secondary | ICD-10-CM | POA: Diagnosis not present

## 2015-08-04 DIAGNOSIS — H353221 Exudative age-related macular degeneration, left eye, with active choroidal neovascularization: Secondary | ICD-10-CM | POA: Diagnosis not present

## 2015-08-25 DIAGNOSIS — H353211 Exudative age-related macular degeneration, right eye, with active choroidal neovascularization: Secondary | ICD-10-CM | POA: Diagnosis not present

## 2015-08-25 DIAGNOSIS — H353221 Exudative age-related macular degeneration, left eye, with active choroidal neovascularization: Secondary | ICD-10-CM | POA: Diagnosis not present

## 2015-08-27 ENCOUNTER — Ambulatory Visit (INDEPENDENT_AMBULATORY_CARE_PROVIDER_SITE_OTHER): Payer: Medicare Other | Admitting: *Deleted

## 2015-08-27 DIAGNOSIS — I442 Atrioventricular block, complete: Secondary | ICD-10-CM

## 2015-08-27 NOTE — Progress Notes (Signed)
Remote pacemaker transmission.   

## 2015-09-02 LAB — CUP PACEART REMOTE DEVICE CHECK
Battery Impedance: 1470 Ohm
Battery Remaining Longevity: 42 mo
Battery Voltage: 2.76 V
Brady Statistic AP VP Percent: 0 %
Brady Statistic AS VS Percent: 0 %
Date Time Interrogation Session: 20170614121447
Implantable Lead Implant Date: 20020116
Implantable Lead Location: 753859
Lead Channel Pacing Threshold Pulse Width: 0.4 ms
Lead Channel Setting Pacing Amplitude: 2.5 V
Lead Channel Setting Pacing Pulse Width: 0.4 ms
Lead Channel Setting Sensing Sensitivity: 2.8 mV
MDC IDC LEAD IMPLANT DT: 20020116
MDC IDC LEAD LOCATION: 753860
MDC IDC MSMT LEADCHNL RA IMPEDANCE VALUE: 67 Ohm
MDC IDC MSMT LEADCHNL RA SENSING INTR AMPL: 0.7 mV
MDC IDC MSMT LEADCHNL RV IMPEDANCE VALUE: 460 Ohm
MDC IDC MSMT LEADCHNL RV PACING THRESHOLD AMPLITUDE: 0.75 V
MDC IDC STAT BRADY AP VS PERCENT: 0 %
MDC IDC STAT BRADY AS VP PERCENT: 100 %

## 2015-09-03 ENCOUNTER — Encounter: Payer: Self-pay | Admitting: Cardiology

## 2015-09-30 DIAGNOSIS — H353211 Exudative age-related macular degeneration, right eye, with active choroidal neovascularization: Secondary | ICD-10-CM | POA: Diagnosis not present

## 2015-10-13 DIAGNOSIS — H353221 Exudative age-related macular degeneration, left eye, with active choroidal neovascularization: Secondary | ICD-10-CM | POA: Diagnosis not present

## 2015-11-18 DIAGNOSIS — H353211 Exudative age-related macular degeneration, right eye, with active choroidal neovascularization: Secondary | ICD-10-CM | POA: Diagnosis not present

## 2015-11-26 ENCOUNTER — Ambulatory Visit (INDEPENDENT_AMBULATORY_CARE_PROVIDER_SITE_OTHER): Payer: Medicare Other | Admitting: *Deleted

## 2015-11-26 DIAGNOSIS — I442 Atrioventricular block, complete: Secondary | ICD-10-CM

## 2015-11-26 NOTE — Progress Notes (Signed)
Remote pacemaker transmission.   

## 2015-11-27 ENCOUNTER — Encounter: Payer: Self-pay | Admitting: Cardiology

## 2015-12-04 LAB — CUP PACEART REMOTE DEVICE CHECK
Battery Impedance: 1640 Ohm
Brady Statistic AP VS Percent: 0 %
Brady Statistic AS VP Percent: 100 %
Brady Statistic AS VS Percent: 0 %
Implantable Lead Implant Date: 20020116
Implantable Lead Implant Date: 20020116
Implantable Lead Location: 753860
Implantable Lead Model: 5076
Lead Channel Impedance Value: 480 Ohm
Lead Channel Impedance Value: 67 Ohm
Lead Channel Pacing Threshold Amplitude: 0.625 V
Lead Channel Pacing Threshold Pulse Width: 0.4 ms
Lead Channel Sensing Intrinsic Amplitude: 0.7 mV
Lead Channel Setting Pacing Pulse Width: 0.4 ms
MDC IDC LEAD LOCATION: 753859
MDC IDC MSMT BATTERY REMAINING LONGEVITY: 38 mo
MDC IDC MSMT BATTERY VOLTAGE: 2.76 V
MDC IDC SESS DTM: 20170913135028
MDC IDC SET LEADCHNL RV PACING AMPLITUDE: 2.5 V
MDC IDC SET LEADCHNL RV SENSING SENSITIVITY: 2.8 mV
MDC IDC STAT BRADY AP VP PERCENT: 0 %

## 2015-12-10 DIAGNOSIS — M810 Age-related osteoporosis without current pathological fracture: Secondary | ICD-10-CM | POA: Diagnosis not present

## 2015-12-10 DIAGNOSIS — R8299 Other abnormal findings in urine: Secondary | ICD-10-CM | POA: Diagnosis not present

## 2015-12-10 DIAGNOSIS — N390 Urinary tract infection, site not specified: Secondary | ICD-10-CM | POA: Diagnosis not present

## 2015-12-10 DIAGNOSIS — R7301 Impaired fasting glucose: Secondary | ICD-10-CM | POA: Diagnosis not present

## 2015-12-10 DIAGNOSIS — I10 Essential (primary) hypertension: Secondary | ICD-10-CM | POA: Diagnosis not present

## 2015-12-10 DIAGNOSIS — E784 Other hyperlipidemia: Secondary | ICD-10-CM | POA: Diagnosis not present

## 2015-12-17 DIAGNOSIS — J45998 Other asthma: Secondary | ICD-10-CM | POA: Diagnosis not present

## 2015-12-17 DIAGNOSIS — R7301 Impaired fasting glucose: Secondary | ICD-10-CM | POA: Diagnosis not present

## 2015-12-17 DIAGNOSIS — M810 Age-related osteoporosis without current pathological fracture: Secondary | ICD-10-CM | POA: Diagnosis not present

## 2015-12-17 DIAGNOSIS — Z23 Encounter for immunization: Secondary | ICD-10-CM | POA: Diagnosis not present

## 2015-12-17 DIAGNOSIS — R42 Dizziness and giddiness: Secondary | ICD-10-CM | POA: Diagnosis not present

## 2015-12-17 DIAGNOSIS — J449 Chronic obstructive pulmonary disease, unspecified: Secondary | ICD-10-CM | POA: Diagnosis not present

## 2015-12-17 DIAGNOSIS — Z950 Presence of cardiac pacemaker: Secondary | ICD-10-CM | POA: Diagnosis not present

## 2015-12-17 DIAGNOSIS — Z1231 Encounter for screening mammogram for malignant neoplasm of breast: Secondary | ICD-10-CM | POA: Diagnosis not present

## 2015-12-17 DIAGNOSIS — I2510 Atherosclerotic heart disease of native coronary artery without angina pectoris: Secondary | ICD-10-CM | POA: Diagnosis not present

## 2015-12-17 DIAGNOSIS — Z681 Body mass index (BMI) 19 or less, adult: Secondary | ICD-10-CM | POA: Diagnosis not present

## 2015-12-17 DIAGNOSIS — Z1389 Encounter for screening for other disorder: Secondary | ICD-10-CM | POA: Diagnosis not present

## 2015-12-17 DIAGNOSIS — I5189 Other ill-defined heart diseases: Secondary | ICD-10-CM | POA: Diagnosis not present

## 2015-12-17 DIAGNOSIS — Z0000 Encounter for general adult medical examination without abnormal findings: Secondary | ICD-10-CM | POA: Diagnosis not present

## 2015-12-18 ENCOUNTER — Other Ambulatory Visit: Payer: Self-pay | Admitting: Internal Medicine

## 2015-12-18 MED ORDER — ISOSORBIDE MONONITRATE ER 30 MG PO TB24: 30.0000 mg | ORAL_TABLET | Freq: Every day | ORAL | 2 refills | Status: DC

## 2015-12-22 ENCOUNTER — Other Ambulatory Visit: Payer: Self-pay | Admitting: *Deleted

## 2015-12-22 MED ORDER — ISOSORBIDE MONONITRATE ER 30 MG PO TB24: 30.0000 mg | ORAL_TABLET | Freq: Every day | ORAL | 0 refills | Status: DC

## 2015-12-23 DIAGNOSIS — H353221 Exudative age-related macular degeneration, left eye, with active choroidal neovascularization: Secondary | ICD-10-CM | POA: Diagnosis not present

## 2015-12-25 DIAGNOSIS — H353211 Exudative age-related macular degeneration, right eye, with active choroidal neovascularization: Secondary | ICD-10-CM | POA: Diagnosis not present

## 2016-01-27 ENCOUNTER — Other Ambulatory Visit: Payer: Self-pay | Admitting: Physician Assistant

## 2016-02-02 DIAGNOSIS — H353211 Exudative age-related macular degeneration, right eye, with active choroidal neovascularization: Secondary | ICD-10-CM | POA: Diagnosis not present

## 2016-03-02 DIAGNOSIS — H353221 Exudative age-related macular degeneration, left eye, with active choroidal neovascularization: Secondary | ICD-10-CM | POA: Diagnosis not present

## 2016-03-04 ENCOUNTER — Encounter: Payer: Self-pay | Admitting: Internal Medicine

## 2016-03-04 ENCOUNTER — Ambulatory Visit (INDEPENDENT_AMBULATORY_CARE_PROVIDER_SITE_OTHER): Payer: Medicare Other | Admitting: Internal Medicine

## 2016-03-04 VITALS — BP 100/68 | HR 62 | Ht 63.0 in | Wt 115.4 lb

## 2016-03-04 DIAGNOSIS — I442 Atrioventricular block, complete: Secondary | ICD-10-CM

## 2016-03-04 DIAGNOSIS — Z950 Presence of cardiac pacemaker: Secondary | ICD-10-CM

## 2016-03-04 NOTE — Patient Instructions (Signed)
Medication Instructions: - Your physician recommends that you continue on your current medications as directed. Please refer to the Current Medication list given to you today.  Labwork: - none ordered  Procedures/Testing: - none ordered  Follow-Up: - Remote monitoring is used to monitor your Pacemaker of ICD from home. This monitoring reduces the number of office visits required to check your device to one time per year. It allows [place] to keep an eye on the functioning of your device to ensure it is working properly. You are scheduled for a device check from home on 06/03/16. You may send your transmission at any time that day. If you have a wireless device, the transmission will be sent automatically. After your physician reviews your transmission, you will receive a postcard with your next transmission date.  - Your physician wants you to follow-up in: 1 year with Dr. Caryl Comes. You will receive a reminder letter in the mail two months in advance. If you don't receive a letter, please call our office to schedule the follow-up appointment.  Any Additional Special Instructions Will Be Listed Below (If Applicable).     If you need a refill on your cardiac medications before your next appointment, please call your pharmacy.

## 2016-03-04 NOTE — Progress Notes (Addendum)
[facility]      Patient Care Team: Crist Infante, MD as PCP - General (Internal Medicine)   HPI  Jamie [name] is a 83 y.o. female  seen in followup for complete heart block for which she is status post pacemaker implantation which is programmed in the VDD mode. She underwent device generator replacement fall 2010  She also has CAD with complete LAD infarct  and now has oxygen dependent COPD   She says that she has some increasing fatigue. In part she related to the lack of exercise. We were also whether is related to her beta blocker.  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    Catheterization>>> no obstructive coronary disease. (August 2012)  Echocardiogram September 2015 demonstrated interval decrease in LV function 60--40% with significant wall motion abnormalities this is in comparison to May 2014. Troponins were abnormal with 0.46. It was felt that she had a completed LAD infarction and this was confirmed by Myoview scanning  She has had recurrent syncope. These episodes have been on triggered as best that she not have lasted less than 10 seconds. There is no residual orthostatic intolerance. With these episodes occurred following a single nitroglycerin. The other occurred as a fall.    Past Medical History:  Diagnosis Date  . Asthma   . Carotid stenosis    a. Carotid US (9/15):  Bilateral ICA 1-39%  . Chronic combined systolic and diastolic CHF (congestive heart failure) ([place])   . Complete AV block ([place])   . COPD (chronic obstructive pulmonary disease) ([place])   . Family history of anesthesia complication    SISTER HAS DIFFICULTY WAKING  . GERD (gastroesophageal reflux disease)   . Hypertension   . Ischemic cardiomyopathy    a.  Echo (9/15):  EF 40-45%, ant and apical and inferoapical HK, Gr 1 DD, trivial MR, mild TR, PASP 23 mmHg;  b.  Myoview (9/15) large scar in mid LAD territory, no peri-infarct ischemia, EF 60%, apical AK; intermediate risk >> patient opted for med Rx  . Osteoarthritis   . Osteopenia   . Pacemaker   . Pneumonia   . Presence of permanent cardiac pacemaker   . Shortness of breath   . Tachycardia   . UTI (urinary tract infection)     Past Surgical History:  Procedure Laterality Date  . CHOLECYSTECTOMY    . EYE SURGERY  10/2004  . HERNIA REPAIR  11/2002  . PACEMAKER PLACEMENT     Medtronic    Current Outpatient Prescriptions  Medication Sig Dispense Refill  . albuterol (PROAIR HFA) 108 (90 BASE) MCG/ACT inhaler Inhale 2 puffs into the lungs every 6 (six) hours as needed for wheezing or shortness of breath.     . ALPRAZolam (XANAX) 0.5 MG tablet Take 0.5 mg by mouth at bedtime as needed for sleep.     Marland Kitchen aspirin EC 81 MG tablet Take 81 mg by mouth daily.     . beta carotene w/minerals (OCUVITE) tablet Take 1 tablet by mouth daily.    . Calcium Carbonate (CALTRATE 600) 1500 MG TABS Take 600 mg by mouth daily.     . cholecalciferol (VITAMIN D) 1000 UNITS tablet Take 1,000 Units by mouth daily.      . Coenzyme Q10 (CO Q 10 PO) Take 1 capsule by mouth daily.    . cyanocobalamin  1000 MCG tablet Take 1,000 mcg by mouth daily.     . furosemide (LASIX) 20 MG tablet Take 1 tablet (20 mg total) by mouth daily. 30 tablet 2  . isosorbide mononitrate (IMDUR) 30 MG 24 hr tablet Take 1 tablet (30 mg total) by mouth daily. 90 tablet 0  . levalbuterol (XOPENEX) 0.63 MG/3ML nebulizer solution Take 0.63 mg by nebulization every 6 (six) hours as needed for shortness of breath.    . losartan (COZAAR) 25 MG tablet TAKE ONE TABLET BY MOUTH ONE TIME DAILY 30 tablet 1  . Magnesium 250 MG TABS Take 250 mg by mouth daily.     . metoprolol (LOPRESSOR) 50 MG tablet Take 0.5 tablets (25 mg total) by mouth 2 (two) times daily. 30 tablet 10  . nitroGLYCERIN (NITROSTAT) 0.4 MG SL tablet Place 1 tablet (0.4 mg total) under the  tongue every 5 (five) minutes as needed for chest pain. 15 tablet 12  . Omega-3 Fatty Acids (FISH OIL) 1200 MG CAPS Take 1,200 mg by mouth daily.     Vladimir Faster Glycol-Propyl Glycol (SYSTANE) 0.4-0.3 % SOLN Place 1 drop into both eyes 2 (two) times daily as needed (dry eyes).    . potassium chloride (K-DUR) 10 MEQ tablet Take 10 mEq by mouth 2 (two) times a week. Tuesday and Friday     No current facility-administered medications for this visit.     Allergies  Allergen Reactions  . Ace Inhibitors Cough  . Hydrocodone-Acetaminophen Other (See Comments)     GI distress    Review of Systems negative except from HPI and PMH  Physical Exam BP 100/68   Pulse 62   Ht 5\' 3"  (1.6 m)   Wt 115 lb 6.4 oz (52.3 kg)   SpO2 97%   BMI 20.44 kg/m  Well developed and well nourished in no acute distress Wearing oxygen HENT normal E scleral and icterus clear Neck Supple JVP flat; carotids brisk and full Clear to ausculation Device pocket well healed; without hematoma or erythema.  There is no tethering  Regular rate and rhythm, no murmurs gallops or rub Soft with active bowel sounds No clubbing cyanosis none Edema Alert and oriented, grossly normal motor and sensory function Skin Warm and Dry  ECG demonstrates sinus rhythm with P   synchronous pacing at rate  64 Assessment and  Plan  Coronary artery disease with prior MI  Complete heart block  Pacemaker-Medtronic  Statin intolerance  syncoep   Overall Jamie [name] has done very well. She is euvolemic. There is no symptoms of ischemia. We will continue her current medications.  The episodes of syncope are concerning. There brevity suggests an arrhythmia as opposed to a vasomotor phenomenon. With a pacemaker in place, the explanation would have to be over sensing and pacing inhibition. There could also be tachycardia below her detection rates that is triggering a vasomotor episode, however, this is less likely given the brevity of  the episodes.  No evidence of myocardial potential inhibition could be visualized; we decrease ventricular sensitivity to 10 mV.  She was also noted to have PMT in the context of only intermittent retrograde conduction. PVARP was extended   In the event that she has recurrent syncope, I would consider implantation of a loop recorder  More than 50% of 45 min was spent in counseling related to the above

## 2016-03-11 DIAGNOSIS — H353211 Exudative age-related macular degeneration, right eye, with active choroidal neovascularization: Secondary | ICD-10-CM | POA: Diagnosis not present

## 2016-03-11 DIAGNOSIS — H35351 Cystoid macular degeneration, right eye: Secondary | ICD-10-CM | POA: Diagnosis not present

## 2016-03-11 DIAGNOSIS — H353221 Exudative age-related macular degeneration, left eye, with active choroidal neovascularization: Secondary | ICD-10-CM | POA: Diagnosis not present

## 2016-03-11 DIAGNOSIS — H353131 Nonexudative age-related macular degeneration, bilateral, early dry stage: Secondary | ICD-10-CM | POA: Diagnosis not present

## 2016-03-23 LAB — CUP PACEART INCLINIC DEVICE CHECK
Battery Impedance: 1758 Ohm
Battery Remaining Longevity: 36 mo
Brady Statistic AP VP Percent: 0 %
Brady Statistic AP VS Percent: 0 %
Brady Statistic AS VS Percent: 0 %
Date Time Interrogation Session: 20171221184633
Implantable Lead Implant Date: 20020116
Implantable Lead Implant Date: 20020116
Implantable Lead Location: 753859
Implantable Lead Location: 753860
Implantable Pulse Generator Implant Date: 20101108
Lead Channel Impedance Value: 464 Ohm
Lead Channel Pacing Threshold Amplitude: 1 V
Lead Channel Pacing Threshold Pulse Width: 0.4 ms
Lead Channel Setting Pacing Pulse Width: 0.4 ms
MDC IDC MSMT BATTERY VOLTAGE: 2.76 V
MDC IDC MSMT LEADCHNL RA IMPEDANCE VALUE: 67 Ohm
MDC IDC MSMT LEADCHNL RA SENSING INTR AMPL: 0.7 mV
MDC IDC SET LEADCHNL RV PACING AMPLITUDE: 2.5 V
MDC IDC SET LEADCHNL RV SENSING SENSITIVITY: 11.2 mV
MDC IDC STAT BRADY AS VP PERCENT: 100 %

## 2016-03-24 ENCOUNTER — Other Ambulatory Visit: Payer: Self-pay | Admitting: Physician Assistant

## 2016-03-30 ENCOUNTER — Other Ambulatory Visit: Payer: Self-pay | Admitting: Internal Medicine

## 2016-04-15 DIAGNOSIS — H353211 Exudative age-related macular degeneration, right eye, with active choroidal neovascularization: Secondary | ICD-10-CM | POA: Diagnosis not present

## 2016-04-19 ENCOUNTER — Other Ambulatory Visit: Payer: Self-pay | Admitting: Internal Medicine

## 2016-05-03 DIAGNOSIS — H353221 Exudative age-related macular degeneration, left eye, with active choroidal neovascularization: Secondary | ICD-10-CM | POA: Diagnosis not present

## 2016-05-19 ENCOUNTER — Encounter (HOSPITAL_COMMUNITY): Payer: Self-pay | Admitting: *Deleted

## 2016-05-19 ENCOUNTER — Emergency Department (HOSPITAL_COMMUNITY): Payer: Medicare Other

## 2016-05-19 ENCOUNTER — Inpatient Hospital Stay (HOSPITAL_COMMUNITY)
Admission: EM | Admit: 2016-05-19 | Discharge: 2016-05-22 | DRG: 302 | Disposition: A | Discharge status: Home / Self Care | Payer: Medicare Other | Attending: Internal Medicine | Admitting: Internal Medicine

## 2016-05-19 DIAGNOSIS — R531 Weakness: Secondary | ICD-10-CM | POA: Diagnosis not present

## 2016-05-19 DIAGNOSIS — Z9114 Patient's other noncompliance with medication regimen: Secondary | ICD-10-CM

## 2016-05-19 DIAGNOSIS — R509 Fever, unspecified: Secondary | ICD-10-CM | POA: Diagnosis present

## 2016-05-19 DIAGNOSIS — I5042 Chronic combined systolic (congestive) and diastolic (congestive) heart failure: Secondary | ICD-10-CM | POA: Diagnosis present

## 2016-05-19 DIAGNOSIS — Z885 Allergy status to narcotic agent status: Secondary | ICD-10-CM | POA: Diagnosis not present

## 2016-05-19 DIAGNOSIS — J439 Emphysema, unspecified: Secondary | ICD-10-CM | POA: Diagnosis present

## 2016-05-19 DIAGNOSIS — Z79899 Other long term (current) drug therapy: Secondary | ICD-10-CM | POA: Diagnosis not present

## 2016-05-19 DIAGNOSIS — J9620 Acute and chronic respiratory failure, unspecified whether with hypoxia or hypercapnia: Secondary | ICD-10-CM | POA: Diagnosis not present

## 2016-05-19 DIAGNOSIS — Z7982 Long term (current) use of aspirin: Secondary | ICD-10-CM

## 2016-05-19 DIAGNOSIS — Z9981 Dependence on supplemental oxygen: Secondary | ICD-10-CM | POA: Diagnosis not present

## 2016-05-19 DIAGNOSIS — R079 Chest pain, unspecified: Secondary | ICD-10-CM | POA: Diagnosis not present

## 2016-05-19 DIAGNOSIS — Z950 Presence of cardiac pacemaker: Secondary | ICD-10-CM | POA: Diagnosis not present

## 2016-05-19 DIAGNOSIS — I255 Ischemic cardiomyopathy: Secondary | ICD-10-CM | POA: Diagnosis not present

## 2016-05-19 DIAGNOSIS — J9611 Chronic respiratory failure with hypoxia: Secondary | ICD-10-CM | POA: Diagnosis not present

## 2016-05-19 DIAGNOSIS — K219 Gastro-esophageal reflux disease without esophagitis: Secondary | ICD-10-CM | POA: Diagnosis present

## 2016-05-19 DIAGNOSIS — Z8249 Family history of ischemic heart disease and other diseases of the circulatory system: Secondary | ICD-10-CM | POA: Diagnosis not present

## 2016-05-19 DIAGNOSIS — J431 Panlobular emphysema: Secondary | ICD-10-CM | POA: Diagnosis not present

## 2016-05-19 DIAGNOSIS — I252 Old myocardial infarction: Secondary | ICD-10-CM | POA: Diagnosis not present

## 2016-05-19 DIAGNOSIS — I110 Hypertensive heart disease with heart failure: Secondary | ICD-10-CM | POA: Diagnosis present

## 2016-05-19 DIAGNOSIS — I10 Essential (primary) hypertension: Secondary | ICD-10-CM | POA: Diagnosis not present

## 2016-05-19 DIAGNOSIS — Z9111 Patient's noncompliance with dietary regimen: Secondary | ICD-10-CM | POA: Diagnosis not present

## 2016-05-19 DIAGNOSIS — I5023 Acute on chronic systolic (congestive) heart failure: Secondary | ICD-10-CM | POA: Diagnosis not present

## 2016-05-19 DIAGNOSIS — R0789 Other chest pain: Secondary | ICD-10-CM | POA: Diagnosis not present

## 2016-05-19 DIAGNOSIS — E785 Hyperlipidemia, unspecified: Secondary | ICD-10-CM | POA: Diagnosis present

## 2016-05-19 DIAGNOSIS — R0602 Shortness of breath: Secondary | ICD-10-CM

## 2016-05-19 DIAGNOSIS — Z66 Do not resuscitate: Secondary | ICD-10-CM | POA: Diagnosis present

## 2016-05-19 DIAGNOSIS — I25110 Atherosclerotic heart disease of native coronary artery with unstable angina pectoris: Secondary | ICD-10-CM | POA: Diagnosis not present

## 2016-05-19 DIAGNOSIS — Z87891 Personal history of nicotine dependence: Secondary | ICD-10-CM | POA: Diagnosis not present

## 2016-05-19 DIAGNOSIS — I200 Unstable angina: Secondary | ICD-10-CM

## 2016-05-19 DIAGNOSIS — Z888 Allergy status to other drugs, medicaments and biological substances status: Secondary | ICD-10-CM | POA: Diagnosis not present

## 2016-05-19 DIAGNOSIS — I5043 Acute on chronic combined systolic (congestive) and diastolic (congestive) heart failure: Secondary | ICD-10-CM | POA: Diagnosis present

## 2016-05-19 LAB — CBC WITH DIFFERENTIAL/PLATELET
Basophils Absolute: 0.1 10*3/uL (ref 0.0–0.1)
Basophils Relative: 1 %
EOS ABS: 0.3 10*3/uL (ref 0.0–0.7)
Eosinophils Relative: 5 %
HCT: 39 % (ref 36.0–46.0)
Hemoglobin: 13.1 g/dL (ref 12.0–15.0)
Lymphocytes Relative: 35 %
Lymphs Abs: 1.6 10*3/uL (ref 0.7–4.0)
MCH: 31.9 pg (ref 26.0–34.0)
MCHC: 33.6 g/dL (ref 30.0–36.0)
MCV: 94.9 fL (ref 78.0–100.0)
Monocytes Absolute: 0.5 10*3/uL (ref 0.1–1.0)
Monocytes Relative: 11 %
Neutro Abs: 2.2 10*3/uL (ref 1.7–7.7)
Neutrophils Relative %: 48 %
Platelets: 200 10*3/uL (ref 150–400)
RBC: 4.11 MIL/uL (ref 3.87–5.11)
RDW: 13.1 % (ref 11.5–15.5)
WBC: 4.6 10*3/uL (ref 4.0–10.5)

## 2016-05-19 LAB — I-STAT TROPONIN, ED: Troponin i, poc: 0.01 ng/mL (ref 0.00–0.08)

## 2016-05-19 LAB — COMPREHENSIVE METABOLIC PANEL
ALBUMIN: 3.5 g/dL (ref 3.5–5.0)
ALT: 15 U/L (ref 14–54)
AST: 20 U/L (ref 15–41)
Alkaline Phosphatase: 70 U/L (ref 38–126)
Anion gap: 9 (ref 5–15)
BUN: 15 mg/dL (ref 6–20)
CO2: 28 mmol/L (ref 22–32)
Calcium: 9.3 mg/dL (ref 8.9–10.3)
Chloride: 99 mmol/L — ABNORMAL LOW (ref 101–111)
Creatinine, Ser: 0.9 mg/dL (ref 0.44–1.00)
GFR calc Af Amer: >60 mL/min (ref >60)
GFR calc non Af Amer: 54 mL/min — ABNORMAL LOW (ref >60)
GLUCOSE: 106 mg/dL — AB (ref 65–99)
Potassium: 3.5 mmol/L (ref 3.5–5.1)
SODIUM: 136 mmol/L (ref 135–145)
Total Bilirubin: 0.7 mg/dL (ref 0.3–1.2)
Total Protein: 6.1 g/dL — ABNORMAL LOW (ref 6.5–8.1)

## 2016-05-19 LAB — URINALYSIS, ROUTINE W REFLEX MICROSCOPIC
Bilirubin Urine: NEGATIVE
GLUCOSE, UA: NEGATIVE mg/dL
Ketones, ur: 5 mg/dL — AB
NITRITE: NEGATIVE
Protein, ur: NEGATIVE mg/dL
SPECIFIC GRAVITY, URINE: 1.01 (ref 1.005–1.030)
pH: 5 (ref 5.0–8.0)

## 2016-05-19 LAB — TROPONIN I: Troponin I: 0.03 ng/mL (ref <0.03)

## 2016-05-19 LAB — D-DIMER, QUANTITATIVE: D-Dimer, Quant: 0.65 ug/mL-FEU — ABNORMAL HIGH (ref 0.00–0.50)

## 2016-05-19 LAB — INFLUENZA PANEL BY PCR (TYPE A & B)
Influenza A By PCR: NEGATIVE
Influenza B By PCR: NEGATIVE

## 2016-05-19 LAB — HEPARIN LEVEL (UNFRACTIONATED): Heparin Unfractionated: <0.1 IU/mL — ABNORMAL LOW (ref 0.30–0.70)

## 2016-05-19 LAB — MRSA PCR SCREENING: MRSA BY PCR: NEGATIVE

## 2016-05-19 LAB — LIPASE, BLOOD: Lipase: 34 U/L (ref 11–51)

## 2016-05-19 LAB — BRAIN NATRIURETIC PEPTIDE: B NATRIURETIC PEPTIDE 5: 166.9 pg/mL — AB (ref 0.0–100.0)

## 2016-05-19 MED ORDER — LOSARTAN POTASSIUM 25 MG PO TABS: 25.0000 mg | ORAL_TABLET | Freq: Every day | ORAL | Status: DC

## 2016-05-19 MED ORDER — METOPROLOL TARTRATE 25 MG PO TABS
25.0000 mg | ORAL_TABLET | Freq: Every day | ORAL | Status: DC
  Administered 2016-05-19 – 2016-05-20 (×2): 25 mg via ORAL
  Filled 2016-05-19 (×2): qty 1

## 2016-05-19 MED ORDER — ISOSORBIDE MONONITRATE ER 30 MG PO TB24: 30.0000 mg | ORAL_TABLET | Freq: Every day | ORAL | Status: DC

## 2016-05-19 MED ORDER — ALBUTEROL SULFATE (2.5 MG/3ML) 0.083% IN NEBU: 2.5000 mg | INHALATION_SOLUTION | Freq: Four times a day (QID) | RESPIRATORY_TRACT | Status: DC | PRN

## 2016-05-19 MED ORDER — GI COCKTAIL ~~LOC~~
30.0000 mL | Freq: Once | ORAL | Status: AC
  Administered 2016-05-19: 30 mL via ORAL
  Filled 2016-05-19: qty 30

## 2016-05-19 MED ORDER — CALCIUM CARBONATE 1500 (600 CA) MG PO TABS
1500.0000 mg | ORAL_TABLET | Freq: Every day | ORAL | Status: DC
  Filled 2016-05-19 (×2): qty 1

## 2016-05-19 MED ORDER — MORPHINE SULFATE (PF) 4 MG/ML IV SOLN
2.0000 mg | Freq: Once | INTRAVENOUS | Status: AC
  Administered 2016-05-19: 2 mg via INTRAVENOUS
  Filled 2016-05-19: qty 1

## 2016-05-19 MED ORDER — HEPARIN (PORCINE) IN NACL 100-0.45 UNIT/ML-% IJ SOLN
750.0000 [IU]/h | INTRAMUSCULAR | Status: DC
  Administered 2016-05-19: 600 [IU]/h via INTRAVENOUS
  Filled 2016-05-19: qty 250

## 2016-05-19 MED ORDER — MORPHINE SULFATE (PF) 2 MG/ML IV SOLN: 0.5000 mg | INTRAVENOUS | Status: DC | PRN

## 2016-05-19 MED ORDER — FUROSEMIDE 10 MG/ML IJ SOLN
20.0000 mg | Freq: Every day | INTRAMUSCULAR | Status: DC
Start: 2016-05-19 — End: 2016-05-20
  Administered 2016-05-19: 20 mg via INTRAVENOUS
  Filled 2016-05-19: qty 2

## 2016-05-19 MED ORDER — POTASSIUM CHLORIDE CRYS ER 10 MEQ PO TBCR
10.0000 meq | EXTENDED_RELEASE_TABLET | Freq: Every day | ORAL | Status: DC
  Administered 2016-05-19 – 2016-05-20 (×2): 10 meq via ORAL
  Filled 2016-05-19 (×2): qty 1

## 2016-05-19 MED ORDER — ASPIRIN EC 81 MG PO TBEC
81.0000 mg | DELAYED_RELEASE_TABLET | Freq: Every day | ORAL | Status: DC
  Administered 2016-05-19 – 2016-05-22 (×4): 81 mg via ORAL
  Filled 2016-05-19 (×4): qty 1

## 2016-05-19 MED ORDER — LEVALBUTEROL HCL 0.63 MG/3ML IN NEBU
0.6300 mg | INHALATION_SOLUTION | Freq: Four times a day (QID) | RESPIRATORY_TRACT | Status: DC | PRN
  Administered 2016-05-19 – 2016-05-20 (×2): 0.63 mg via RESPIRATORY_TRACT
  Filled 2016-05-19 (×2): qty 3

## 2016-05-19 MED ORDER — MAGNESIUM 200 MG PO TABS
250.0000 mg | ORAL_TABLET | Freq: Every day | ORAL | Status: DC
  Filled 2016-05-19: qty 2

## 2016-05-19 MED ORDER — SODIUM CHLORIDE 0.9 % IV SOLN
INTRAVENOUS | Status: DC
  Administered 2016-05-19: 08:00:00 via INTRAVENOUS

## 2016-05-19 MED ORDER — MAGNESIUM 200 MG PO TABS
200.0000 mg | ORAL_TABLET | Freq: Every day | ORAL | Status: DC
  Administered 2016-05-19: 200 mg via ORAL
  Filled 2016-05-19 (×2): qty 1

## 2016-05-19 MED ORDER — CALCIUM CARBONATE 1500 (600 CA) MG PO TABS
600.0000 mg | ORAL_TABLET | Freq: Every day | ORAL | Status: DC
  Filled 2016-05-19: qty 1

## 2016-05-19 MED ORDER — ONDANSETRON HCL 4 MG/2ML IJ SOLN
4.0000 mg | Freq: Four times a day (QID) | INTRAMUSCULAR | Status: DC | PRN
  Administered 2016-05-20: 4 mg via INTRAVENOUS
  Filled 2016-05-19: qty 2

## 2016-05-19 MED ORDER — HEPARIN BOLUS VIA INFUSION
2000.0000 [IU] | Freq: Once | INTRAVENOUS | Status: AC
  Administered 2016-05-19: 2000 [IU] via INTRAVENOUS
  Filled 2016-05-19: qty 2000

## 2016-05-19 MED ORDER — POLYVINYL ALCOHOL 1.4 % OP SOLN
1.0000 [drp] | Freq: Two times a day (BID) | OPHTHALMIC | Status: DC | PRN
  Filled 2016-05-19: qty 15

## 2016-05-19 MED ORDER — LOSARTAN POTASSIUM 25 MG PO TABS
25.0000 mg | ORAL_TABLET | Freq: Every day | ORAL | Status: DC
  Administered 2016-05-19 – 2016-05-22 (×3): 25 mg via ORAL
  Filled 2016-05-19 (×4): qty 1

## 2016-05-19 MED ORDER — ACETAMINOPHEN 325 MG PO TABS: 650.0000 mg | ORAL_TABLET | ORAL | Status: DC | PRN

## 2016-05-19 MED ORDER — ORAL CARE MOUTH RINSE
15.0000 mL | Freq: Two times a day (BID) | OROMUCOSAL | Status: DC
  Administered 2016-05-20 – 2016-05-22 (×4): 15 mL via OROMUCOSAL

## 2016-05-19 MED ORDER — OMEGA-3-ACID ETHYL ESTERS 1 G PO CAPS
1.0000 g | ORAL_CAPSULE | Freq: Two times a day (BID) | ORAL | Status: DC
  Administered 2016-05-19 – 2016-05-22 (×7): 1 g via ORAL
  Filled 2016-05-19 (×8): qty 1

## 2016-05-19 MED ORDER — ALPRAZOLAM 0.5 MG PO TABS: 0.5000 mg | ORAL_TABLET | Freq: Every evening | ORAL | Status: DC | PRN

## 2016-05-19 MED ORDER — NITROGLYCERIN IN D5W 200-5 MCG/ML-% IV SOLN
0.0000 ug/min | INTRAVENOUS | Status: DC
  Administered 2016-05-19: 5 ug/min via INTRAVENOUS
  Filled 2016-05-19: qty 250

## 2016-05-19 NOTE — Progress Notes (Signed)
Paged by nursing staff regarding chest pain, discussed with patient, she continue to have intermittent "heat" like sensation in anterior chest (a little to the right side), but after dinner, she also started having L flank pain. Somewhat improved after burping. Also on IV nitro. Currently 5/10 pain over the L flank.  I have discussed with patient and family, they wished for conservative management with medical therapy. EKG obtained showed paced rhythm, more noticeable TWI in V4 when compare to previous EKG in 02/2016. I recommended continue observation, uptitrate IV nitro as needed.   Hilbert Corrigan PA Pager: [phone]

## 2016-05-19 NOTE — ED Triage Notes (Signed)
Pt to ED by [facility] c/o burning sensation in chest that started last night. Pt took nitro x 3 with relief enough for pt to go to sleep. Pt woke at 2am with burning sensation again, took 650mg  ASA, and called EMS. Lung sounds diminished on L side with harshness noted to R lung. EMS gave 5mg  albuterol. EKG enroute with irregular rhythm. Pt has a demand Medtronic pacemaker that had adjustments in December for a complete heart block. Temp 101.8 enroute. Pt denies actual chest pain and is on 2L oxygen [place] at home. 18g IV in L arm, hypertensive at 170s/70s enroute.

## 2016-05-19 NOTE — ED Notes (Signed)
Admitting MD at bedside.

## 2016-05-19 NOTE — ED Notes (Signed)
Pacer in VDD mode All finding are within expected limits today Vent high rate on 3/2.

## 2016-05-19 NOTE — H&P (Signed)
History and Physical    Jamie [name]:[id number] DOB: September 07, 1922 DOA: 05/19/2016   PCP: Jerlyn Ly, MD   Patient coming from/Resides with: Private residence  Admission status: Inpatient/sdu-medically necessary to stay a minimum 2 midnights to rule out impending and/or unexpected changes in physiologic status that may differ from initial evaluation performed in the ER and/or at time of admission. Presents with "chest burning" with radiation to right arm associated with diaphoresis and shortness of breath with decrease and subsequent resolution of chest discomfort after administration of aspirin and nitrate. Symptoms similar to prior cardiac ischemic symptoms. Also developed fever 101.8 and given chest discomfort and shortness of breath concerning for possible concomitant influenza. Patient will be matted for an ischemic workup. Symptoms concerning for evolving acute coronary syndrome so we'll begin IV nitroglycerin infusion titrate for chest pain as well as IV heparin. Cardiology will be consulted. She will require telemetry monitoring, close nursing observation with frequent vital signs, neurological checks and close monitoring of intake and output.  Chief Complaint: Chest pain  HPI: Jamie [name] is a 83 y.o. female with medical history significant for pacemaker for complete heart block, known CAD with prior LAD infarction (per Myoview), chronic combined diastolic and systolic heart failure with ischemic cardiomyopathy, COPD on oxygen, hypertension and GERD. She began developing her typical burning type chest discomfort yesterday evening. Took 3 nitroglycerin which decreased but did not resolve the chest pain but she was able to go to sleep. She awakened at 2 AM with chest discomfort and took aspirin. Called EMS who reported decreased lung sounds on the left heart was on the right. She was given albuterol. Temperature was 101.73F. She was hypertensive. She has described her chest pain as being  8/10 burning in quality associated with shortness of breath and a sweaty sensation. The chest pain has been waxing and waning since arrival and at times completely resolves for up to 30 minutes but then returns and is now radiating down her right arm. She has not had any cough or chills. For 3 weeks she has noticed progressive dyspnea on exertion and shortness of breath and fatigue. She has not had any orthopnea.  ED Course:  Vital Signs: BP 138/60   Pulse 71   Temp 98.5 F (36.9 C) (Oral)   Resp 19   Ht 5' 3.5" (1.613 m)   Wt 49.4 kg (109 lb)   SpO2 92%   BMI 19.01 kg/m  2 view CXR: Radiologist read as stable hyperinflation and chronic interstitial coarsening without consolidation, effusion or CHF. Per my evaluation there appears to be a new evolving opacity/haziness in the right upper lobe. Lab data: Sodium 136, potassium 3.5, chloride 99, CO2 28, glucose 106, BUN 15, creatinine 0.9, LFTs not elevated, BNP 167, poc troponin 0.01, white count 4600 with normal differential, hemoglobin 13.1, platelets 200,000 Medications and treatments: GI cocktail 1, morphine 2 mg IV 1  Review of Systems:  In addition to the HPI above,  No chills, myalgias or other constitutional symptoms No Headache, changes with Vision or hearing, new weakness, tingling, numbness in any extremity, dizziness, dysarthria or word finding difficulty, gait disturbance or imbalance, tremors or seizure activity No problems swallowing food or Liquids, indigestion/reflux, choking or coughing while eating, abdominal pain with or after eating No Cough,palpitations, orthopnea  No Abdominal pain, N/V, melena,hematochezia, dark tarry stools, constipation No dysuria, malodorous urine, hematuria or flank pain No new skin rashes, lesions, masses or bruises, No new joint pains, aches, swelling or redness  No recent unintentional weight gain or loss No polyuria, polydypsia or polyphagia   Past Medical History:  Diagnosis Date  .  Asthma   . Carotid stenosis    a. Carotid US (9/15):  Bilateral ICA 1-39%  . Chronic combined systolic and diastolic CHF (congestive heart failure) ([place])   . Complete AV block ([place])   . COPD (chronic obstructive pulmonary disease) ([place])   . Family history of anesthesia complication    SISTER HAS DIFFICULTY WAKING  . GERD (gastroesophageal reflux disease)   . Hypertension   . Ischemic cardiomyopathy    a.  Echo (9/15):  EF 40-45%, ant and apical and inferoapical HK, Gr 1 DD, trivial MR, mild TR, PASP 23 mmHg;  b. Myoview (9/15) large scar in mid LAD territory, no peri-infarct ischemia, EF 60%, apical AK; intermediate risk >> patient opted for med Rx  . Osteoarthritis   . Osteopenia   . Pacemaker   . Pneumonia   . Presence of permanent cardiac pacemaker   . Shortness of breath   . Tachycardia   . UTI (urinary tract infection)     Past Surgical History:  Procedure Laterality Date  . CHOLECYSTECTOMY    . EYE SURGERY  10/2004  . HERNIA REPAIR  11/2002  . PACEMAKER PLACEMENT     [employer]tronic    Social History   Social History  . Marital status: Widowed    Spouse name: N/A  . Number of children: N/A  . Years of education: N/A   Occupational History  . retired Retired   Social History Main Topics  . Smoking status: Former Smoker    Packs/day: 1.00    Years: 35.00    Types: Cigarettes    Quit date: 03/16/1987  . Smokeless tobacco: Never Used  . Alcohol use No  . Drug use: No  . Sexual activity: No   Other Topics Concern  . Not on file   Social History Narrative  . No narrative on file    Mobility: Without assistive devices Work history: N/A   Allergies  Allergen Reactions  . Ace Inhibitors Cough  . Hydrocodone-Acetaminophen Other (See Comments)     GI distress    Family History  Problem Relation Age of Onset  . Heart disease Sister   . Breast cancer Sister      Prior to Admission medications   Medication Sig Start Date End Date Taking? Authorizing  Provider  albuterol (PROAIR HFA) 108 (90 BASE) MCG/ACT inhaler Inhale 2 puffs into the lungs every 6 (six) hours as needed for wheezing or shortness of breath.    Yes Historical Provider, MD  ALPRAZolam Duanne Moron) 0.5 MG tablet Take 0.5 mg by mouth at bedtime as needed for sleep.    Yes Historical Provider, MD  aspirin EC 81 MG tablet Take 81 mg by mouth daily.    Yes Historical Provider, MD  beta carotene w/minerals (OCUVITE) tablet Take 1 tablet by mouth daily.   Yes Historical Provider, MD  Calcium Carbonate (CALTRATE 600) 1500 MG TABS Take 600 mg by mouth daily.    Yes Historical Provider, MD  cholecalciferol (VITAMIN D) 1000 UNITS tablet Take 1,000 Units by mouth daily.     Yes Historical Provider, MD  Coenzyme Q10 (CO Q 10 PO) Take 1 capsule by mouth daily.   Yes Historical Provider, MD  cyanocobalamin 1000 MCG tablet Take 1,000 mcg by mouth daily.    Yes Historical Provider, MD  furosemide (LASIX) 20 MG tablet Take 1 tablet (  20 mg total) by mouth daily. 07/25/12  Yes Ripudeep Krystal Eaton, MD  Glucosamine-Chondroitin (OSTEO BI-FLEX REGULAR STRENGTH PO) Take 2 tablets by mouth daily.   Yes Historical Provider, MD  isosorbide mononitrate (IMDUR) 30 MG 24 hr tablet TAKE 1 TABLET BY MOUTH DAILY 04/21/16  Yes Deboraha Sprang, MD  levalbuterol [facility]) 0.63 MG/3ML nebulizer solution Take 0.63 mg by nebulization every 6 (six) hours as needed for shortness of breath. 07/25/12  Yes Ripudeep Krystal Eaton, MD  losartan (COZAAR) 25 MG tablet TAKE ONE TABLET BY MOUTH ONE TIME DAILY 03/24/16  Yes Liliane Shi, PA-C  Magnesium 250 MG TABS Take 250 mg by mouth daily.    Yes Historical Provider, MD  metoprolol (LOPRESSOR) 50 MG tablet TAKE 1/2 A TAB BY MOUTH TWICE A DAY Patient taking differently: TAKE 1/2 A TAB EVERY MORNING AND A SECOND 1/2 IN THE EVENING AS NEEDED FOR BLOOD PRESSURE 03/30/16  Yes Deboraha Sprang, MD  nitroGLYCERIN (NITROSTAT) 0.4 MG SL tablet Place 1 tablet (0.4 mg total) under the tongue every 5 (five) minutes  as needed for chest pain. 11/28/13  Yes Charlynne Cousins, MD  Omega-3 Fatty Acids (FISH OIL) 1200 MG CAPS Take 1,200 mg by mouth daily.    Yes Historical Provider, MD  Polyethyl Glycol-Propyl Glycol (SYSTANE) 0.4-0.3 % SOLN Place 1 drop into both eyes 2 (two) times daily as needed (dry eyes).   Yes Historical Provider, MD  potassium chloride (K-DUR) 10 MEQ tablet Take 10 mEq by mouth 2 (two) times a week. Tuesday and Friday   Yes Historical Provider, MD    Physical Exam: Vitals:   05/19/16 0630 05/19/16 0645 05/19/16 0700 05/19/16 0730  BP: 121/57 (!) 123/51 (!) 126/48 138/60  Pulse: 74 72 68 71  Resp: 20   19  Temp:      TempSrc:      SpO2: 97% 92% 95% 92%  Weight:      Height:          Constitutional: NAD, calm, comfortable-HOH Eyes: PERRL, lids and conjunctivae normal ENMT: Mucous membranes are moist. Posterior pharynx clear of any exudate or lesions.Normal dentition.  Neck: normal, supple, no masses, no thyromegaly Respiratory: Decreased air movement on the left with crackles from mid fields down on her right, no wheezing, Normal respiratory effort without accessory muscle use. 2 Liters Cardiovascular: Irregular rate and rhythm, no murmurs / rubs / gallops. No extremity edema. 2+ pedal pulses. No carotid bruits. ? Ventricular paced rhythm, ventricular rate 83 bpm, QTC 490 ms on EKG Abdomen: no tenderness, no masses palpated. No hepatosplenomegaly. Bowel sounds positive.  Musculoskeletal: no clubbing / cyanosis. No joint deformity upper and lower extremities. Good ROM, no contractures. Normal muscle tone.  Skin: no rashes, lesions, ulcers. No induration Neurologic: CN 2-12 grossly intact except for known hearing loss. Sensation intact, DTR normal. Strength 5/5 x all 4 extremities.  Psychiatric: Normal judgment and insight. Alert and oriented x 3. Normal mood.    Labs on Admission: I have personally reviewed following labs and imaging studies  CBC:  Recent Labs Lab  05/19/16 0410  WBC 4.6  NEUTROABS 2.2  HGB 13.1  HCT 39.0  MCV 94.9  PLT 124   Basic Metabolic Panel:  Recent Labs Lab 05/19/16 0410  NA 136  K 3.5  CL 99*  CO2 28  GLUCOSE 106*  BUN 15  CREATININE 0.90  CALCIUM 9.3   GFR: Estimated Creatinine Clearance: 31.1 mL/min (by C-G formula based on SCr  of 0.9 mg/dL). Liver Function Tests:  Recent Labs Lab 05/19/16 0410  AST 20  ALT 15  ALKPHOS 70  BILITOT 0.7  PROT 6.1*  ALBUMIN 3.5    Recent Labs Lab 05/19/16 0410  LIPASE 34   No results for input(s): AMMONIA in the last 168 hours. Coagulation Profile: No results for input(s): INR, PROTIME in the last 168 hours. Cardiac Enzymes: No results for input(s): CKTOTAL, CKMB, CKMBINDEX, TROPONINI in the last 168 hours. BNP (last 3 results) No results for input(s): PROBNP in the last 8760 hours. HbA1C: No results for input(s): HGBA1C in the last 72 hours. CBG: No results for input(s): GLUCAP in the last 168 hours. Lipid Profile: No results for input(s): CHOL, HDL, LDLCALC, TRIG, CHOLHDL, LDLDIRECT in the last 72 hours. Thyroid Function Tests: No results for input(s): TSH, T4TOTAL, FREET4, T3FREE, THYROIDAB in the last 72 hours. Anemia Panel: No results for input(s): VITAMINB12, FOLATE, FERRITIN, TIBC, IRON, RETICCTPCT in the last 72 hours. Urine analysis:    Component Value Date/Time   COLORURINE YELLOW 08/04/2014 [place] 08/04/2014 2211   LABSPEC 1.008 08/04/2014 2211   PHURINE 5.0 08/04/2014 2211   GLUCOSEU NEGATIVE 08/04/2014 2211   HGBUR MODERATE (A) 08/04/2014 2211   BILIRUBINUR NEGATIVE 08/04/2014 2211   KETONESUR NEGATIVE 08/04/2014 2211   PROTEINUR NEGATIVE 08/04/2014 2211   UROBILINOGEN 0.2 08/04/2014 2211   NITRITE NEGATIVE 08/04/2014 2211   LEUKOCYTESUR SMALL (A) 08/04/2014 2211   Sepsis Labs: @LABRCNTIP (procalcitonin:4,lacticidven:4) )No results found for this or any previous visit (from the past 240 hour(s)).    Radiological Exams on Admission: Dg Chest 2 View  Result Date: 05/19/2016 CLINICAL DATA:  Midchest pain and dyspnea tonight EXAM: CHEST  2 VIEW COMPARISON:  08/04/2014 FINDINGS: Stable hyperinflation. Generalized interstitial coarsening appears chronic. No airspace consolidation. No effusions. Normal pulmonary vasculature. Hilar, mediastinal and cardiac contours are unremarkable and unchanged. The transvenous cardiac leads appear grossly intact. IMPRESSION: Stable hyperinflation and chronic interstitial coarsening. No consolidation, effusion or evidence of CHF. Electronically Signed   By: Andreas Newport M.D.   On: 05/19/2016 04:31    EKG: (Independently reviewed) Ventricular paced with a rate 83 beats minute, QTC 490 ms, underlying rhythm pattern irregular with apparent escape beats of indeterminate etiology, underlying rhythm with left axis deviation and likely underlying left bundle branch block/IVCD  Assessment/Plan Principal Problem:   Chest pain/History of LAD infarction  -Patient presents with chest burning with some radiation of pain down to right arm associated with a "sweaty" sensation as well as shortness of breath that has been relieved at times with nitroglycerin but has been waxing and waning since arrival to the ER-concerning for unstable angina -Last cardiac catheterization w/o obstructive disease (August 2012). Regional wall motion abnormalities on echocardiogram in 2015 concerning for ischemic etiology w/ Myoview confirmed LAD territory infarct. -Outpatient cardiology notes reviewed: Issues with syncope possibly arrhythmogenic mediated although has occurred after the administration of nitroglycerin SL-formal pacemaker interrogation recommended -Patient has remained chest pain-free for up to 30 minutes since arrival but chest pain has recurred so we'll initiate nitroglycerin infusion -IV heparin for possible evolving ACS/UAP -Consult cardiology -Echocardiogram -Cycle  troponin -Continue beta blocker -NPO with low rate IV fluids 50/hr-sips with meds only -Continue aspirin, omega-3 fatty acids-not on statin prior to admission  Active Problems:   Fever -Had not been experiencing any coughs or chills but had documented fever 101.8 by EMS -Chest x-ray with subtle changes in right upper lobe concerning for possible pneumonitis versus  expected progression of COPD interstitial changes -Check influenza PCR    Chronic combined systolic and diastolic heart failure/ischemic cardiomyopathy -Appears compensated without evidence of lower extremity edema and BNP is normal, chest x-ray equivocal with possible new right upper lobe abnormality -NPO so hold Lasix and potassium for now -Holding Cozaar and setting of need for IV nitrates and history of reported syncope after administration of sublingual nitrates -Last echocardiogram 11/2013: EF has decreased to 40-45% with focal mid to distal anterior and apical and inferolateral hypokinesis suggestive of LAD territory ischemia and infarct. Pacer related septal dyskinesis. Grade 1 diastolic dysfunction doubt evidence of pulmonary hypertension or tricuspid regurgitation. -Daily weights; strict I/O -Repeat echocardiogram this admission as above    HTN (hypertension) -Blood pressure currently controlled and somewhat suboptimal -Holding Cozaar and Lasix as above -Continue beta blocker    COPD (chronic obstructive pulmonary disease) with emphysema/Chronic respiratory failure with hypoxia, on home O2 therapy  -Oxygen saturation stable on usual home dose of oxygen at 2 L/m -Continue preadmission nebulizers    Pacemaker-Medtronic -May require formal pacemaker interrogation to exclude arrhythmogenic etiology to patient's recent issues with chest pain and syncope (no immediate syncope episodes prior to this admission)    Dyslipidemia -Continue omega-3 fatty acids      DVT prophylaxis: Heparin infusion Code Status: DO NOT  RESUSCITATE Family Communication: Daughter Disposition Plan: Home Consults called: Cardiology/ [facility] on call MD    Samella Parr ANP-BC [name] Pager [phone]   If 7PM-7AM, please contact night-coverage [email or web address] Password [email or web address]  05/19/2016, 8:01 AM

## 2016-05-19 NOTE — Progress Notes (Addendum)
ANTICOAGULATION CONSULT NOTE  Pharmacy Consult for heparin Indication: chest pain/ACS  Heparin Dosing Weight: 49.4 kg  Assessment: 69 yof with CP. Pharmacy consulted to dose heparin for ACS. Not on anticoagulation PTA. CBC wnl. No bleeding or infusion related issues reported. Troponin levels remain negative, however RN states patient still having mild chest pain and given positive D-Dimer will give will give bolus to facilitate quicker therapeutic levels.   Goal of Therapy:  Heparin level 0.3-0.7 units/ml Monitor platelets by anticoagulation protocol: Yes   Plan:  Bolus 2000 units IV heparin Increase heparin gtt to 750 units/h 8h heparin level Daily heparin level/CBC Monitor s/sx bleeding  Georga Bora, PharmD Clinical Pharmacist Pager: [phone] 05/19/2016 4:48 PM

## 2016-05-19 NOTE — Progress Notes (Signed)
Pt c/o lt flank  pain during shift assessment, new onset. On call text paged and came up to see pt. EKG obtained. Pt offered pain meds but refused. No new orders received at this time.   Pt states that pain has gradually went away.

## 2016-05-19 NOTE — Progress Notes (Addendum)
Cardiology note reviewed. Recommendation for initiation of Lasix to treat suspected diastolic heart failure. Patient admitted to nonadherence with medications and diet prior to admission. Resume. IV fluids stopped. Recommendation to continue IV nitroglycerin to control blood pressure. I will go ahead and resume Cozaar. Echocardiogram pending. Additional recommendation for d-dimer/obtained.  Erin Hearing, ANP

## 2016-05-19 NOTE — Consult Note (Signed)
CARDIOLOGY CONSULT NOTE   Patient ID: Jamie [name] MRN: [id number] DOB/AGE: November 29, 1922 83 y.o.  Admit date: 05/19/2016  Requesting Physician: Linna Darner, MD - [facility] Primary Physician:   Jerlyn Ly, MD Primary Cardiologist:   Dr. Virl Axe Reason for Consultation:   Chest pain  HPI: JA PISTOLE is a 83 y.o. female is a DNR with a history of complete heart block s/p pacemaker implantation programmed in the VDD mode. GERD,  Device generator replacement in fall 2010. She has CAD with complete LAD infarct, oxygen dependent COPD.  Catheterization>>>no obstructive coronary disease. (August 2012)  Echocardiogram September 2015 demonstrated interval decrease in LV function 60--40% with significant wall motion abnormalities this is in comparison to May 2014. Troponins were abnormal with 0.46. It was felt that she had a completed LAD infarction and this was confirmed by Myoview scanning  Pt presented to the ER for chest pain that started yesterday, she took 3 nitroglycerin tablets with some mild improvement of her pain but re awoke with reoccurring pain. She has had progressive fatigue and DOE for the past 2-3 months. In the emergency room she had negative Troponin, BNP 166.9, chest xray non acute. She was admitted to Dr. Alcario Drought with concern for unstable angina and ongoing chest pain.   She continues to have chest "heat" sensation with diaphoresis, DOE and weakness. Daughter is concerned about involuntary muscle twitching and jerking.   Past Medical History:  Diagnosis Date  . Asthma   . Carotid stenosis    a. Carotid US (9/15):  Bilateral ICA 1-39%  . Chronic combined systolic and diastolic CHF (congestive heart failure) ([place])   . Complete AV block ([place])   . COPD (chronic obstructive pulmonary disease) ([place])   . Family history of anesthesia complication    SISTER HAS DIFFICULTY WAKING  . GERD (gastroesophageal reflux disease)   . Hypertension   . Ischemic cardiomyopathy     a.  Echo (9/15):  EF 40-45%, ant and apical and inferoapical HK, Gr 1 DD, trivial MR, mild TR, PASP 23 mmHg;  b. Myoview (9/15) large scar in mid LAD territory, no peri-infarct ischemia, EF 60%, apical AK; intermediate risk >> patient opted for med Rx  . Osteoarthritis   . Osteopenia   . Pacemaker   . Pneumonia   . Presence of permanent cardiac pacemaker   . Shortness of breath   . Tachycardia   . UTI (urinary tract infection)      Past Surgical History:  Procedure Laterality Date  . CHOLECYSTECTOMY    . EYE SURGERY  10/2004  . HERNIA REPAIR  11/2002  . PACEMAKER PLACEMENT     [employer]    Allergies  Allergen Reactions  . Ace Inhibitors Cough  . Hydrocodone-Acetaminophen Other (See Comments)     GI distress    I have reviewed the patient's current medications . aspirin EC  81 mg Oral Daily  . calcium carbonate  1,500 mg Oral Daily  . Magnesium  200 mg Oral Daily  . metoprolol  25 mg Oral Daily  . omega-3 acid ethyl esters  1 g Oral BID   . sodium chloride 50 mL/hr at 05/19/16 0910  . heparin 600 Units/hr (05/19/16 0900)  . nitroGLYCERIN 10 mcg/min (05/19/16 1345)   acetaminophen, albuterol, ALPRAZolam, levalbuterol, morphine injection, ondansetron (ZOFRAN) IV, polyvinyl alcohol  Prior to Admission medications   Medication Sig Start Date End Date Taking? Authorizing Provider  albuterol (PROAIR HFA) 108 (90 BASE) MCG/ACT  inhaler Inhale 2 puffs into the lungs every 6 (six) hours as needed for wheezing or shortness of breath.    Yes Historical Provider, MD  ALPRAZolam Duanne Moron) 0.5 MG tablet Take 0.5 mg by mouth at bedtime as needed for sleep.    Yes Historical Provider, MD  aspirin EC 81 MG tablet Take 81 mg by mouth daily.    Yes Historical Provider, MD  beta carotene w/minerals (OCUVITE) tablet Take 1 tablet by mouth daily.   Yes Historical Provider, MD  Calcium Carbonate (CALTRATE 600) 1500 MG TABS Take 600 mg by mouth daily.    Yes Historical Provider, MD    cholecalciferol (VITAMIN D) 1000 UNITS tablet Take 1,000 Units by mouth daily.     Yes Historical Provider, MD  Coenzyme Q10 (CO Q 10 PO) Take 1 capsule by mouth daily.   Yes Historical Provider, MD  cyanocobalamin 1000 MCG tablet Take 1,000 mcg by mouth daily.    Yes Historical Provider, MD  furosemide (LASIX) 20 MG tablet Take 1 tablet (20 mg total) by mouth daily. 07/25/12  Yes Ripudeep Krystal Eaton, MD  Glucosamine-Chondroitin (OSTEO BI-FLEX REGULAR STRENGTH PO) Take 2 tablets by mouth daily.   Yes Historical Provider, MD  isosorbide mononitrate (IMDUR) 30 MG 24 hr tablet TAKE 1 TABLET BY MOUTH DAILY 04/21/16  Yes Deboraha Sprang, MD  levalbuterol [facility]) 0.63 MG/3ML nebulizer solution Take 0.63 mg by nebulization every 6 (six) hours as needed for shortness of breath. 07/25/12  Yes Ripudeep Krystal Eaton, MD  losartan (COZAAR) 25 MG tablet TAKE ONE TABLET BY MOUTH ONE TIME DAILY 03/24/16  Yes Liliane Shi, PA-C  Magnesium 250 MG TABS Take 250 mg by mouth daily.    Yes Historical Provider, MD  metoprolol (LOPRESSOR) 50 MG tablet TAKE 1/2 A TAB BY MOUTH TWICE A DAY Patient taking differently: TAKE 1/2 A TAB EVERY MORNING AND A SECOND 1/2 IN THE EVENING AS NEEDED FOR BLOOD PRESSURE 03/30/16  Yes Deboraha Sprang, MD  nitroGLYCERIN (NITROSTAT) 0.4 MG SL tablet Place 1 tablet (0.4 mg total) under the tongue every 5 (five) minutes as needed for chest pain. 11/28/13  Yes Charlynne Cousins, MD  Omega-3 Fatty Acids (FISH OIL) 1200 MG CAPS Take 1,200 mg by mouth daily.    Yes Historical Provider, MD  Polyethyl Glycol-Propyl Glycol (SYSTANE) 0.4-0.3 % SOLN Place 1 drop into both eyes 2 (two) times daily as needed (dry eyes).   Yes Historical Provider, MD  potassium chloride (K-DUR) 10 MEQ tablet Take 10 mEq by mouth 2 (two) times a week. Tuesday and Friday   Yes Historical Provider, MD     Social History   Social History  . Marital status: Widowed    Spouse name: N/A  . Number of children: N/A  . Years of education:  N/A   Occupational History  . retired Retired   Social History Main Topics  . Smoking status: Former Smoker    Packs/day: 1.00    Years: 35.00    Types: Cigarettes    Quit date: 03/16/1987  . Smokeless tobacco: Never Used  . Alcohol use No  . Drug use: No  . Sexual activity: No   Other Topics Concern  . Not on file   Social History Narrative  . No narrative on file    Family Status  Relation Status  . Mother Deceased  . Father Deceased  . Sister   . Sister    Family History  Problem Relation Age of  Onset  . Heart disease Sister   . Breast cancer Sister      ROS:  Full 14 point review of systems complete and found to be negative unless listed above.  Physical Exam: Blood pressure (!) 145/68, pulse 62, temperature 97.7 F (36.5 C), temperature source Oral, resp. rate 18, height _0  (1.6 m), weight 110 lb 3.7 oz (50 kg), SpO2 100 %.  General: Well developed, frail, female in no acute distress Head: Eyes PERRLA, No xanthomas  Normocephalic and atraumatic Lungs: decreased BS throughout with poor air movement Heart: Normal rate and rhythm Neck: No carotid bruits. No lymphadenopathy.  JVD. Abdomen: Bowel sounds present, abdomen soft and non-tender without masses or hernias noted. Msk:  No spine or cva tenderness. Generalize weakness, frail., no joint deformities or effusions. Extremities: No clubbing or cyanosis.  edema.  Neuro: Alert and oriented X 3. No focal deficits noted. Psych:  Good affect, responds appropriately Skin: No rashes or lesions noted.  Labs:   Lab Results  Component Value Date   WBC 4.6 05/19/2016   HGB 13.1 05/19/2016   HCT 39.0 05/19/2016   MCV 94.9 05/19/2016   PLT 200 05/19/2016    Recent Labs Lab 05/19/16 0410  NA 136  K 3.5  CL 99*  CO2 28  BUN 15  CREATININE 0.90  CALCIUM 9.3  PROT 6.1*  BILITOT 0.7  ALKPHOS 70  ALT 15  AST 20  GLUCOSE 106*  ALBUMIN 3.5    Recent Labs  05/19/16 0731 05/19/16 1224  TROPONINI <0.03  <0.03    Recent Labs  05/19/16 0417  TROPIPOC 0.01    Lipase  Date/Time Value Ref Range Status  05/19/2016 04:10 AM 34 11 - 51 U/L Final   Echo:   Transthoracic Echocardiography 11/26/2013  Study Conclusions  - Left ventricle: The cavity size was normal. Wall thickness was normal. Systolic function was mildly to moderately reduced. The estimated ejection fraction was in the range of 40% to 45%. There is mid to distal anterior and apical and inferoapical hypokinesis, suggesting LAD territory ischemia/infarct. Paced septal dyskinesis. Doppler parameters are consistent with abnormal left ventricular relaxation (grade 1 diastolic dysfunction). The E/e&' ratio is between 8-15, suggesting indeterminate LV filling pressure. - Mitral valve: Calcified annulus. There was trivial regurgitation. - Left atrium: The atrium was normal in size. - Right ventricle: The cavity size was normal. Wall thickness was normal. Pacer wire or catheter noted in right ventricle. - Right atrium: The atrium was at the upper limits of normal in size. Pacer wire or catheter noted in right atrium. - Tricuspid valve: There was mild regurgitation. - Pulmonary arteries: PA peak pressure: 23 mm Hg (S).  Impressions:  - Compared to the echo in 07/2012, there is now a reduced EF to 40-45% with an LAD territory wall motion abnormality.  ECG:  HR 83, NSR with occasional APC's.  Radiology:    Dg Chest 2 View Result Date: 05/19/2016  IMPRESSION: Stable hyperinflation and chronic interstitial coarsening. No consolidation, effusion or evidence of CHF. Electronically Signed   By: Andreas Newport M.D.   On: 05/19/2016 04:31    ASSESSMENT AND PLAN:     1. Chest pain Troponin are negative x 3.   Her most recent Cath in August 2012 was without obstructive disease, 2014 ECHO displayed regional wall motion abnormality concerning for ischemic etiology; patient opted for medical management.  -   She does not want a heart catheterization performed this admission.  -  Cont  aspirin daily, Heparin drip, Nitro drip, Lopressor 25 mg daily -  Not currently on statin but on omega-3 fatty acid, continue. -  Echo pending -  20 mg IV lasix now and daily, d-dimer ordered  2.Chronic combined systolic and diastolic heart failure/ischemic cardiomyopathy -  Echo pending  3. Hypertension (max: 159/68) ( Min: 116/48)  - pt is on Nitro drip and Metoprolol. Will add IV lasix.  4. Pacemaker-[employer]tr[employer]  5. Dyslipidemia - on omega-3 fatty acids  COPD  Medicine to manage  Fever  Medicine to manage   Seen by Dr. Burt Knack as well.  SignedLinus Mako, PA-C 05/19/2016 1:54 PM   Patient seen, examined. Available data reviewed. Agree with findings, assessment, and plan as outlined by Delos Haring, PA-C. At the time of my evaluation, the patient's daughter is at the bedside. The patient reports 2-3 weeks of worsening shortness of breath. She has not been as consistent with taking her oral furosemide at home. She also describes a "heat" in her chest. She has worsening fatigue and just does not feel well.  On my exam, she is an elderly woman, frail appearing. JVP is elevated, lungs with diminished breath sounds and poor air movement throughout, heart is regular rate and rhythm without murmur or gallop, abdomen is soft and nontender, extremities are without edema.  This is in 83 year old woman with acute on chronic respiratory failure. She has severe underlying COPD. I suspect acute diastolic heart failure is exacerbating her symptoms. I think it is unlikely that this myriad of symptoms represents acute coronary syndrome. She had a heart cath at age 73 without significant CAD. Her troponin values are normal. Her EKG shows ventricular pacing and is not full and distinguishing coronary ischemia.  Recommend:  Check d-dimer  IV Lasix at low dose 20 mg daily, first dose now  2-D  echocardiogram  Discussed the importance of sodium restriction, medication adherence  Continue IV nitroglycerin for now to control blood pressure. Continue metoprolol.  Will follow with you, thanks  Sherren Mocha, M.D. 05/19/2016 3:05 PM

## 2016-05-19 NOTE — ED Notes (Signed)
Patient's pacemaker interrogated using [employer] Carelink device.

## 2016-05-19 NOTE — ED Provider Notes (Signed)
[facility] DEPT Provider Note   CSN: [id number] Arrival date & time: 05/19/16  4098     History   Chief Complaint Chief Complaint  Patient presents with  . Chest Pain    HPI Jamie [name] is a 83 y.o. female with a hx of CAD, pacemaker, GERD, hypertension, ischemic cardiomyopathy, asthma, COPD and oxygen dependent presents to the Emergency Department complaining of acute onset "burning" of her chest onset around 10 PM. Patient reports she took 3 nitroglycerin at that time with some improvement in her pain. She went to bed and was awoken by a return of sensation around 1 AM. She reports taking an additional 3 nitroglycerin and 648 mg of aspirin.  Patient reports she's had worsening fatigue and dyspnea on exertion for the last 2-3 months. Patient's daughter reports concern about her worsening fatigue and dyspnea. Patient's last cardiology appointment was January 2018.  Allergy: Caryl Comes  Record review: Pt is status post pacemaker implantation which is programmed in the VDD mode. She underwent device generator replacement fall 2010  She also has CAD with complete LAD infarct and now has oxygen dependent COPD.    Catheterization>>> no obstructive coronary disease. (August 2012)  Echocardiogram September 2015 demonstrated interval decrease in LV function 60--40% with significant wall motion abnormalities this is in comparison to May 2014. Troponins were abnormal with 0.46. It was felt that she had a completed LAD infarction and this was confirmed by Myoview scanning  The history is provided by the patient and medical records. No language interpreter was used.    Past Medical History:  Diagnosis Date  . Asthma   . Carotid stenosis    a. Carotid US (9/15):  Bilateral ICA 1-39%  . Chronic combined systolic and diastolic CHF (congestive heart failure) ([place])   . Complete AV block ([place])   . COPD (chronic obstructive pulmonary disease) ([place])   . Family history of anesthesia complication      SISTER HAS DIFFICULTY WAKING  . GERD (gastroesophageal reflux disease)   . Hypertension   . Ischemic cardiomyopathy    a.  Echo (9/15):  EF 40-45%, ant and apical and inferoapical HK, Gr 1 DD, trivial MR, mild TR, PASP 23 mmHg;  b. Myoview (9/15) large scar in mid LAD territory, no peri-infarct ischemia, EF 60%, apical AK; intermediate risk >> patient opted for med Rx  . Osteoarthritis   . Osteopenia   . Pacemaker   . Pneumonia   . Presence of permanent cardiac pacemaker   . Shortness of breath   . Tachycardia   . UTI (urinary tract infection)     Patient Active Problem List   Diagnosis Date Noted  . Dyslipidemia   . Blurred vision 08/05/2014  . Hypertension 08/05/2014  . Diaphoresis   . Anginal pain ([place]) 01/22/2014  . Atypical chest pain 01/22/2014  . Elevated troponin 11/26/2013  . Dizziness 11/26/2013  . Elevated brain natriuretic peptide (BNP) level 07/21/2012  . Orthostatic hypotension 08/16/2011  . Non-STEMI  possibly type II 11/17/2010  . VENTRICULAR TACHYCARDIA 09/29/2009  . HYPERTENSION, UNSPECIFIED 06/06/2008  . AV BLOCK, COMPLETE 06/06/2008  . COPD (chronic obstructive pulmonary disease) with emphysema ([place]) 06/06/2008  . OSTEOARTHRITIS 06/06/2008  . OSTEOPENIA 06/06/2008  . TACHYCARDIA 06/06/2008  . Pacemaker-Medtronic 06/06/2008    Past Surgical History:  Procedure Laterality Date  . CHOLECYSTECTOMY    . EYE SURGERY  10/2004  . HERNIA REPAIR  11/2002  . PACEMAKER PLACEMENT     Medtronic    OB  History    No data available       Home Medications    Prior to Admission medications   Medication Sig Start Date End Date Taking? Authorizing Provider  albuterol (PROAIR HFA) 108 (90 BASE) MCG/ACT inhaler Inhale 2 puffs into the lungs every 6 (six) hours as needed for wheezing or shortness of breath.    Yes Historical Provider, MD  ALPRAZolam Duanne Moron) 0.5 MG tablet Take 0.5 mg by mouth at bedtime as needed for sleep.    Yes Historical Provider, MD   aspirin EC 81 MG tablet Take 81 mg by mouth daily.    Yes Historical Provider, MD  beta carotene w/minerals (OCUVITE) tablet Take 1 tablet by mouth daily.   Yes Historical Provider, MD  Calcium Carbonate (CALTRATE 600) 1500 MG TABS Take 600 mg by mouth daily.    Yes Historical Provider, MD  cholecalciferol (VITAMIN D) 1000 UNITS tablet Take 1,000 Units by mouth daily.     Yes Historical Provider, MD  Coenzyme Q10 (CO Q 10 PO) Take 1 capsule by mouth daily.   Yes Historical Provider, MD  cyanocobalamin 1000 MCG tablet Take 1,000 mcg by mouth daily.    Yes Historical Provider, MD  furosemide (LASIX) 20 MG tablet Take 1 tablet (20 mg total) by mouth daily. 07/25/12  Yes Ripudeep Krystal Eaton, MD  Glucosamine-Chondroitin (OSTEO BI-FLEX REGULAR STRENGTH PO) Take 2 tablets by mouth daily.   Yes Historical Provider, MD  isosorbide mononitrate (IMDUR) 30 MG 24 hr tablet TAKE 1 TABLET BY MOUTH DAILY 04/21/16  Yes Deboraha Sprang, MD  levalbuterol [facility]) 0.63 MG/3ML nebulizer solution Take 0.63 mg by nebulization every 6 (six) hours as needed for shortness of breath. 07/25/12  Yes Ripudeep Krystal Eaton, MD  losartan (COZAAR) 25 MG tablet TAKE ONE TABLET BY MOUTH ONE TIME DAILY 03/24/16  Yes Liliane Shi, PA-C  Magnesium 250 MG TABS Take 250 mg by mouth daily.    Yes Historical Provider, MD  metoprolol (LOPRESSOR) 50 MG tablet TAKE 1/2 A TAB BY MOUTH TWICE A DAY Patient taking differently: TAKE 1/2 A TAB EVERY MORNING AND A SECOND 1/2 IN THE EVENING AS NEEDED FOR BLOOD PRESSURE 03/30/16  Yes Deboraha Sprang, MD  nitroGLYCERIN (NITROSTAT) 0.4 MG SL tablet Place 1 tablet (0.4 mg total) under the tongue every 5 (five) minutes as needed for chest pain. 11/28/13  Yes Charlynne Cousins, MD  Omega-3 Fatty Acids (FISH OIL) 1200 MG CAPS Take 1,200 mg by mouth daily.    Yes Historical Provider, MD  Polyethyl Glycol-Propyl Glycol (SYSTANE) 0.4-0.3 % SOLN Place 1 drop into both eyes 2 (two) times daily as needed (dry eyes).   Yes  Historical Provider, MD  potassium chloride (K-DUR) 10 MEQ tablet Take 10 mEq by mouth 2 (two) times a week. Tuesday and Friday   Yes Historical Provider, MD    Family History Family History  Problem Relation Age of Onset  . Heart disease Sister   . Breast cancer Sister     Social History Social History  Substance Use Topics  . Smoking status: Former Smoker    Packs/day: 1.00    Years: 35.00    Types: Cigarettes    Quit date: 03/16/1987  . Smokeless tobacco: Never Used  . Alcohol use No     Allergies   Ace inhibitors and Hydrocodone-acetaminophen   Review of Systems Review of Systems  Respiratory:       Dyspnea on exertion  Cardiovascular: Positive for chest  pain.  All other systems reviewed and are negative.    Physical Exam Updated Vital Signs BP 151/66 (BP Location: Right Arm)   Pulse 83   Temp 98.5 F (36.9 C) (Oral)   Resp 23   Ht 5' 3.5" (1.613 m)   Wt 49.4 kg   SpO2 95%   BMI 19.01 kg/m   Physical Exam  Constitutional: She appears well-developed and well-nourished. No distress.  Awake, alert,  Patient appears uncomfortable  HENT:  Head: Normocephalic and atraumatic.  Mouth/Throat: Oropharynx is clear and moist. No oropharyngeal exudate.  Eyes: Conjunctivae are normal. No scleral icterus.  Neck: Normal range of motion. Neck supple.  Cardiovascular: Normal rate and intact distal pulses.  An irregular rhythm present.  Pulses:      Radial pulses are 2+ on the right side, and 2+ on the left side.       Popliteal pulses are 2+ on the right side, and 2+ on the left side.  Pulmonary/Chest: Effort normal and breath sounds normal. No respiratory distress. She has no wheezes.  Equal chest expansion On oxygen  Abdominal: Soft. Bowel sounds are normal. She exhibits no mass. There is no tenderness. There is no rebound and no guarding.  Musculoskeletal: Normal range of motion. She exhibits no edema.  Neurological: She is alert.  Speech is clear and goal  oriented Moves extremities without ataxia  Skin: Skin is warm. She is diaphoretic. There is pallor.  Psychiatric: She has a normal mood and affect.  Nursing note and vitals reviewed.    ED Treatments / Results  Labs (all labs ordered are listed, but only abnormal results are displayed) Labs Reviewed  COMPREHENSIVE METABOLIC PANEL - Abnormal; Notable for the following:       Result Value   Chloride 99 (*)    Glucose, Bld 106 (*)    Total Protein 6.1 (*)    GFR calc non Af Amer 54 (*)    All other components within normal limits  BRAIN NATRIURETIC PEPTIDE - Abnormal; Notable for the following:    B Natriuretic Peptide 166.9 (*)    All other components within normal limits  CBC WITH DIFFERENTIAL/PLATELET  LIPASE, BLOOD  I-STAT TROPOININ, ED    EKG  EKG Interpretation  Date/Time:  Wednesday May 19 2016 03:57:10 EST Ventricular Rate:  83 PR Interval:    QRS Duration: 144 QT Interval:  417 QTC Calculation: 490 R Axis:   -93 Text Interpretation:  VENTRICULAR PACING Atrial premature complex Nonspecific IVCD with LAD No significant change since last tracing Confirmed by HORTON  MD, COURTNEY ([id number]) on 05/19/2016 4:26:59 AM       Radiology Dg Chest 2 View  Result Date: 05/19/2016 CLINICAL DATA:  Midchest pain and dyspnea tonight EXAM: CHEST  2 VIEW COMPARISON:  08/04/2014 FINDINGS: Stable hyperinflation. Generalized interstitial coarsening appears chronic. No airspace consolidation. No effusions. Normal pulmonary vasculature. Hilar, mediastinal and cardiac contours are unremarkable and unchanged. The transvenous cardiac leads appear grossly intact. IMPRESSION: Stable hyperinflation and chronic interstitial coarsening. No consolidation, effusion or evidence of CHF. Electronically Signed   By: Andreas Newport M.D.   On: 05/19/2016 04:31    Procedures Procedures (including critical care time)  Medications Ordered in ED Medications  gi cocktail (Maalox,Lidocaine,Donnatal) (30  mLs Oral Given 05/19/16 0614)  morphine 4 MG/ML injection 2 mg (2 mg Intravenous Given 05/19/16 0616)     Initial Impression / Assessment and Plan / ED Course  I have reviewed the triage vital signs and  the nursing notes.  Pertinent labs & imaging results that were available during my care of the patient were reviewed by me and considered in my medical decision making (see chart for details).  Clinical Course as of May 19 657  Wed May 19, 2016  0604 She reports she continues to have burning sensation in her chest. She also reports that she feels very weak. She needed assistance to use the bedside commode here in the emergency department.  Time she is requesting admission and I feel this is reasonable.  [HM]  [phone] Discussed with Dr. Alcario Drought who will admit  [HM]    Clinical Course User Index [HM] Abigail Butts, PA-C    Patient presents with chest pain acutely around 10 PM. It has been persistent in spite of two separate rounds of nitroglycerin. Pt with persistent chest pain and generalized weakness. Concern for unstable angina.  Labs reassuring and initial trop negative.  Pt does not wish for aggressive intervention, but remains very uncomfortable.  Discussed with pt and family d/c home versus admission and pt is requesting admission due to persistent CP, worsening DOE and weakness.      The patient was discussed with and seen by Dr. Dina Rich who agrees with the treatment plan.  6:55 AM  Pt reports she is feeling better with improved chest pain at this time.     Final Clinical Impressions(s) / ED Diagnoses   Final diagnoses:  Chest pain, unspecified type  Unstable angina [facility])    New Prescriptions New Prescriptions   No medications on file       Abigail Butts, PA-C 05/19/16 [place], MD 05/19/16 [phone]

## 2016-05-19 NOTE — Progress Notes (Signed)
ANTICOAGULATION CONSULT NOTE  Pharmacy Consult for heparin Indication: chest pain/ACS  Heparin Dosing Weight: 49.4 kg   Assessment: 53 yof with CP. Pharmacy consulted to dose heparin for ACS. Not on anticoagulation PTA. CBC wnl. No bleed documented.  Goal of Therapy:  Heparin level 0.3-0.7 units/ml Monitor platelets by anticoagulation protocol: Yes   Plan:  Start heparin at 600 units/h 8h heparin level Daily heparin level/CBC Monitor s/sx bleeding   Elicia Lamp, PharmD, BCPS Clinical Pharmacist 05/19/2016 7:43 AM

## 2016-05-19 NOTE — ED Notes (Signed)
ED Provider at bedside. 

## 2016-05-20 ENCOUNTER — Inpatient Hospital Stay (HOSPITAL_COMMUNITY): Payer: Medicare Other

## 2016-05-20 DIAGNOSIS — E785 Hyperlipidemia, unspecified: Secondary | ICD-10-CM

## 2016-05-20 DIAGNOSIS — Z9981 Dependence on supplemental oxygen: Secondary | ICD-10-CM

## 2016-05-20 DIAGNOSIS — J9611 Chronic respiratory failure with hypoxia: Secondary | ICD-10-CM

## 2016-05-20 DIAGNOSIS — J431 Panlobular emphysema: Secondary | ICD-10-CM

## 2016-05-20 DIAGNOSIS — R509 Fever, unspecified: Secondary | ICD-10-CM

## 2016-05-20 DIAGNOSIS — Z950 Presence of cardiac pacemaker: Secondary | ICD-10-CM

## 2016-05-20 DIAGNOSIS — R079 Chest pain, unspecified: Secondary | ICD-10-CM

## 2016-05-20 DIAGNOSIS — I5042 Chronic combined systolic (congestive) and diastolic (congestive) heart failure: Secondary | ICD-10-CM

## 2016-05-20 DIAGNOSIS — I5023 Acute on chronic systolic (congestive) heart failure: Secondary | ICD-10-CM

## 2016-05-20 DIAGNOSIS — R0789 Other chest pain: Secondary | ICD-10-CM

## 2016-05-20 LAB — CBC
HEMATOCRIT: 40 % (ref 36.0–46.0)
HEMOGLOBIN: 13 g/dL (ref 12.0–15.0)
MCH: 31.3 pg (ref 26.0–34.0)
MCHC: 32.5 g/dL (ref 30.0–36.0)
MCV: 96.2 fL (ref 78.0–100.0)
Platelets: 202 10*3/uL (ref 150–400)
RBC: 4.16 MIL/uL (ref 3.87–5.11)
RDW: 13.3 % (ref 11.5–15.5)
WBC: 6.4 10*3/uL (ref 4.0–10.5)

## 2016-05-20 LAB — URINE CULTURE

## 2016-05-20 LAB — ECHOCARDIOGRAM COMPLETE
HEIGHTINCHES: 63 in
WEIGHTICAEL: 1763.68 [oz_av]

## 2016-05-20 LAB — BASIC METABOLIC PANEL
Anion gap: 8 (ref 5–15)
BUN: 12 mg/dL (ref 6–20)
CALCIUM: 8.9 mg/dL (ref 8.9–10.3)
CO2: 31 mmol/L (ref 22–32)
Chloride: 97 mmol/L — ABNORMAL LOW (ref 101–111)
Creatinine, Ser: 0.84 mg/dL (ref 0.44–1.00)
GFR calc Af Amer: >60 mL/min (ref >60)
GFR, EST NON AFRICAN AMERICAN: 59 mL/min — AB (ref >60)
GLUCOSE: 98 mg/dL (ref 65–99)
Potassium: 4.1 mmol/L (ref 3.5–5.1)
Sodium: 136 mmol/L (ref 135–145)

## 2016-05-20 LAB — HEPARIN LEVEL (UNFRACTIONATED)
HEPARIN UNFRACTIONATED: 0.48 [IU]/mL (ref 0.30–0.70)
Heparin Unfractionated: 0.56 IU/mL (ref 0.30–0.70)

## 2016-05-20 LAB — MAGNESIUM: Magnesium: 2 mg/dL (ref 1.7–2.4)

## 2016-05-20 MED ORDER — DEXTROSE 5 % IV SOLN
1.0000 g | INTRAVENOUS | Status: DC
  Administered 2016-05-20 – 2016-05-21 (×2): 1 g via INTRAVENOUS
  Filled 2016-05-20 (×3): qty 10

## 2016-05-20 MED ORDER — NITROGLYCERIN 0.4 MG SL SUBL
SUBLINGUAL_TABLET | SUBLINGUAL | Status: AC
  Filled 2016-05-20: qty 1

## 2016-05-20 MED ORDER — MAGNESIUM OXIDE 400 (241.3 MG) MG PO TABS
200.0000 mg | ORAL_TABLET | Freq: Every day | ORAL | Status: DC
  Administered 2016-05-20 – 2016-05-22 (×3): 200 mg via ORAL
  Filled 2016-05-20 (×3): qty 1

## 2016-05-20 MED ORDER — NITROGLYCERIN 0.4 MG SL SUBL: 0.4000 mg | SUBLINGUAL_TABLET | SUBLINGUAL | Status: DC | PRN

## 2016-05-20 MED ORDER — DM-GUAIFENESIN ER 30-600 MG PO TB12
1.0000 | ORAL_TABLET | Freq: Two times a day (BID) | ORAL | Status: DC
  Administered 2016-05-20 – 2016-05-22 (×4): 1 via ORAL
  Filled 2016-05-20 (×4): qty 1

## 2016-05-20 MED ORDER — DEXTROSE 5 % IV SOLN
500.0000 mg | INTRAVENOUS | Status: DC
  Filled 2016-05-20: qty 500

## 2016-05-20 MED ORDER — HEPARIN SODIUM (PORCINE) 5000 UNIT/ML IJ SOLN
5000.0000 [IU] | Freq: Three times a day (TID) | INTRAMUSCULAR | Status: DC
  Administered 2016-05-20 – 2016-05-22 (×5): 5000 [IU] via SUBCUTANEOUS
  Filled 2016-05-20 (×5): qty 1

## 2016-05-20 MED ORDER — METOPROLOL TARTRATE 25 MG PO TABS
25.0000 mg | ORAL_TABLET | Freq: Two times a day (BID) | ORAL | Status: DC
  Administered 2016-05-20 – 2016-05-22 (×3): 25 mg via ORAL
  Filled 2016-05-20 (×4): qty 1

## 2016-05-20 MED ORDER — CALCIUM CARBONATE 1250 (500 CA) MG PO TABS
1.0000 | ORAL_TABLET | Freq: Every day | ORAL | Status: DC
  Administered 2016-05-21 – 2016-05-22 (×2): 500 mg via ORAL
  Filled 2016-05-20 (×3): qty 1

## 2016-05-20 MED ORDER — DEXTROSE 5 % IV SOLN
500.0000 mg | INTRAVENOUS | Status: DC
  Administered 2016-05-20 – 2016-05-22 (×2): 500 mg via INTRAVENOUS
  Filled 2016-05-20 (×3): qty 500

## 2016-05-20 MED ORDER — FUROSEMIDE 10 MG/ML IJ SOLN
20.0000 mg | Freq: Every day | INTRAMUSCULAR | Status: DC
  Administered 2016-05-20: 20 mg via INTRAVENOUS
  Filled 2016-05-20: qty 2

## 2016-05-20 MED ORDER — IPRATROPIUM-ALBUTEROL 0.5-2.5 (3) MG/3ML IN SOLN
3.0000 mL | Freq: Four times a day (QID) | RESPIRATORY_TRACT | Status: DC
  Administered 2016-05-20: 3 mL via RESPIRATORY_TRACT
  Filled 2016-05-20 (×2): qty 3

## 2016-05-20 MED ORDER — IPRATROPIUM-ALBUTEROL 0.5-2.5 (3) MG/3ML IN SOLN
3.0000 mL | Freq: Three times a day (TID) | RESPIRATORY_TRACT | Status: DC
  Administered 2016-05-21 – 2016-05-22 (×4): 3 mL via RESPIRATORY_TRACT
  Filled 2016-05-20 (×4): qty 3

## 2016-05-20 MED ORDER — PANTOPRAZOLE SODIUM 40 MG PO TBEC
40.0000 mg | DELAYED_RELEASE_TABLET | Freq: Every day | ORAL | Status: DC
  Administered 2016-05-20 – 2016-05-22 (×3): 40 mg via ORAL
  Filled 2016-05-20 (×3): qty 1

## 2016-05-20 MED ORDER — IOPAMIDOL (ISOVUE-370) INJECTION 76%
INTRAVENOUS | Status: AC
  Administered 2016-05-20: 100 mL
  Filled 2016-05-20: qty 100

## 2016-05-20 MED ORDER — ISOSORBIDE MONONITRATE ER 30 MG PO TB24
30.0000 mg | ORAL_TABLET | Freq: Every day | ORAL | Status: DC
  Administered 2016-05-20: 30 mg via ORAL
  Filled 2016-05-20 (×2): qty 1

## 2016-05-20 NOTE — Progress Notes (Signed)
At 0700 pt called out and stated that she was having chest tightness, SOB, sweating/cold episodes and nausea. IV nitro increased to 36mcg. Pt offered zofran for nausea and morphine for chest. Refused morphine but did except zofran. EKG obtained, Dr. Acie Fredrickson paged and returned called, ordered to give one tab of SL nitro and said that someone would be up to see her shortly. Pts daughter Jacqlyn Larsen was notified of changes this Am. Day shift RN made  aware of above.

## 2016-05-20 NOTE — Progress Notes (Signed)
CSW called to meet with patient's daughters regarding possible discharge options. CSW explained assessment process with physical therapy and provided private duty list and SNF list.  Cedric Fishman [facility] [phone]

## 2016-05-20 NOTE — Progress Notes (Signed)
ANTICOAGULATION CONSULT NOTE - Follow Up Consult  Pharmacy Consult for Heparin Indication: chest pain/ACS  Allergies  Allergen Reactions  . Ace Inhibitors Cough  . Hydrocodone-Acetaminophen Other (See Comments)     GI distress    Patient Measurements: Height: 5\' 3"  (160 cm) Weight: 110 lb 3.7 oz (50 kg) IBW/kg (Calculated) : 52.4  Vital Signs: Temp: 98 F (36.7 C) (03/07 2347) Temp Source: Oral (03/07 2347) BP: 129/56 (03/08 0000) Pulse Rate: 66 (03/08 0000)  Labs:  Recent Labs  05/19/16 0410 05/19/16 0731 05/19/16 1224 05/19/16 1508 05/19/16 1914 05/20/16 0101  HGB 13.1  --   --   --   --  13.0  HCT 39.0  --   --   --   --  40.0  PLT 200  --   --   --   --  202  HEPARINUNFRC  --   --   --  <0.10*  --  0.56  CREATININE 0.90  --   --   --   --   --   TROPONINI  --  <0.03 <0.03  --  0.03*  --     Estimated Creatinine Clearance: 31.5 mL/min (by C-G formula based on SCr of 0.9 mg/dL).  Assessment: 83 yo F on heparin for r/o ACS. Heparin level therapeutic (0.56) on gtt at 750 units/hr. CBC stable. No bleeding noted.  Goal of Therapy:  Heparin level 0.3-0.7 units/ml Monitor platelets by anticoagulation protocol: Yes   Plan:  Continue heparin gtt at 750 units/h Will f/u 6 hr heparin level to confirm therapeutic  Sherlon Handing, PharmD, BCPS Clinical pharmacist, pager [phone] 05/20/2016,1:59 AM

## 2016-05-20 NOTE — Progress Notes (Signed)
  Echocardiogram 2D Echocardiogram has been performed.  Jamie [name] M 05/20/2016, 11:40 AM

## 2016-05-20 NOTE — Progress Notes (Signed)
PROGRESS NOTE    Jamie [name]  DJT:[id number] DOB: [birth year] DOA: 05/19/2016 PCP: Jerlyn Ly, MD   Brief Narrative:  83 year old female with past medical history of hypertension, COPD with emphysema, dyslipidemia, chronic respiratory failure on home oxygen came to the hospital with complaints of chest pain possible concerns of ACS. Cardiology was consulted who did not seem to think patient was having ACS and echo was ordered. CTA of the chest was ordered as her d-dimer was elevated.   Assessment & Plan:   Principal Problem:   Chest pain Active Problems:   HTN (hypertension)   COPD (chronic obstructive pulmonary disease) with emphysema ([facility])   Pacemaker-Medtronic   Dyslipidemia   Chronic respiratory failure with hypoxia, on home O2 therapy ([facility])   History of LAD infarction   Fever   Chronic combined systolic and diastolic heart failure, NYHA class 2 ([facility])   Ischemic cardiomyopathy  Atypical chest pain -Low concerns for ACS, cardiology has been following the patient -Echocardiogram ordered results are pending at this time -Nitroglycerin when necessary for chest pain -D-dimer elevated therefore CT of the chest was done which was negative for PE -We'll discontinue heparin at this time in order acute heparin for DVT prophylaxis, order PPI -DuoNeb's as needed/Xopenex -She states immature has helped her. We'll continue and monitor for now. Will uptitrate metoprolol  Possible acute bacterial bronchitis/Fevers-prior to arrival -Unknown etiology. Possible underlying bacterial bronchitis? -I will start her on ceftriaxone and azithromycin at this time.  COPD/chronic respiratory failure with hypoxia on home oxygen -Continue home nebulizer -Supplemental oxygen as needed -Mucinex if needed  Chronic combined systolic and diastolic cardiomyopathy-compensated -Lasix IV for now. Plans to change to oral tomorrow morning -Cardiology following -Follow-up echocardiogram results  -Daily  weight and strict input and output -Pacemaker-Medtronic in place.  Hypertension -Blood pressure control, stable at this time  Hyperlipidemia -Continue omega-3 fatty acid.  DVT prophylaxis: Subcutaneous heparin Code Status: DO NOT RESUSCITATE Family Communication:  Daughter is present at bedside Disposition Plan: If continues to stay stable. Will transfer her to telemetry tomorrow with possible discharge in next 24-48 hours.  Consultants:   Cardiology    Antimicrobials:   Ceftriaxone on 3/8  Azithromycin on 3/8   Subjective: Patient states her chest pain is better but still continues to feel some sort of "heat" like sensation in her chest. No other complaints.  Objective: Vitals:   05/20/16 1100 05/20/16 1115 05/20/16 1130 05/20/16 1610  BP: (!) 106/51 (!) 104/49 (!) 106/51 (!) 100/54  Pulse: 69 65 64 70  Resp: (!) 30 (!) 22 (!) 26 (!) 25  Temp: 98.3 F (36.8 C)   98.2 F (36.8 C)  TempSrc: Oral   Oral  SpO2: 97% 97% 98% 92%  Weight:      Height:        Intake/Output Summary (Last 24 hours) at 05/20/16 1743 Last data filed at 05/20/16 0600  Gross per 24 hour  Intake            271.4 ml  Output              650 ml  Net           -378.6 ml   Filed Weights   05/19/16 0402 05/19/16 0910  Weight: 49.4 kg (109 lb) 50 kg (110 lb 3.7 oz)    Examination:  General exam: Appears calm and comfortable  Respiratory system: Clear to auscultation. Respiratory effort normal. Cardiovascular system: S1 & S2 heard, RRR. No JVD,  murmurs, rubs, gallops or clicks. No pedal edema. Gastrointestinal system: Abdomen is nondistended, soft and nontender. No organomegaly or masses felt. Normal bowel sounds heard. Central nervous system: Alert and oriented. No focal neurological deficits. Extremities: Symmetric 5 x 5 power. Skin: No rashes, lesions or ulcers Psychiatry: Judgement and insight appear normal. Mood & affect appropriate.     Data Reviewed:   CBC:  Recent  Labs Lab 05/19/16 0410 05/20/16 0101  WBC 4.6 6.4  NEUTROABS 2.2  --   HGB 13.1 13.0  HCT 39.0 40.0  MCV 94.9 96.2  PLT 200 629   Basic Metabolic Panel:  Recent Labs Lab 05/19/16 0410 05/20/16 0101 05/20/16 0821  NA 136  --  136  K 3.5  --  4.1  CL 99*  --  97*  CO2 28  --  31  GLUCOSE 106*  --  98  BUN 15  --  12  CREATININE 0.90  --  0.84  CALCIUM 9.3  --  8.9  MG  --  2.0  --    GFR: Estimated Creatinine Clearance: 33.7 mL/min (by C-G formula based on SCr of 0.84 mg/dL). Liver Function Tests:  Recent Labs Lab 05/19/16 0410  AST 20  ALT 15  ALKPHOS 70  BILITOT 0.7  PROT 6.1*  ALBUMIN 3.5    Recent Labs Lab 05/19/16 0410  LIPASE 34   No results for input(s): AMMONIA in the last 168 hours. Coagulation Profile: No results for input(s): INR, PROTIME in the last 168 hours. Cardiac Enzymes:  Recent Labs Lab 05/19/16 0731 05/19/16 1224 05/19/16 1914  TROPONINI <0.03 <0.03 0.03*   BNP (last 3 results) No results for input(s): PROBNP in the last 8760 hours. HbA1C: No results for input(s): HGBA1C in the last 72 hours. CBG: No results for input(s): GLUCAP in the last 168 hours. Lipid Profile: No results for input(s): CHOL, HDL, LDLCALC, TRIG, CHOLHDL, LDLDIRECT in the last 72 hours. Thyroid Function Tests: No results for input(s): TSH, T4TOTAL, FREET4, T3FREE, THYROIDAB in the last 72 hours. Anemia Panel: No results for input(s): VITAMINB12, FOLATE, FERRITIN, TIBC, IRON, RETICCTPCT in the last 72 hours. Sepsis Labs: No results for input(s): PROCALCITON, LATICACIDVEN in the last 168 hours.  Recent Results (from the past 240 hour(s))  Culture, Urine     Status: Abnormal   Collection Time: 05/19/16  8:36 AM  Result Value Ref Range Status   Specimen Description URINE, CLEAN CATCH  Final   Special Requests NONE  Final   Culture MULTIPLE SPECIES PRESENT, SUGGEST RECOLLECTION (A)  Final   Report Status 05/20/2016 FINAL  Final  MRSA PCR Screening      Status: None   Collection Time: 05/19/16  9:15 AM  Result Value Ref Range Status   MRSA by PCR NEGATIVE NEGATIVE Final    Comment:        The GeneXpert MRSA Assay (FDA approved for NASAL specimens only), is one component of a comprehensive MRSA colonization surveillance program. It is not intended to diagnose MRSA infection nor to guide or monitor treatment for MRSA infections.          Radiology Studies: Dg Chest 2 View  Result Date: 05/19/2016 CLINICAL DATA:  Midchest pain and dyspnea tonight EXAM: CHEST  2 VIEW COMPARISON:  08/04/2014 FINDINGS: Stable hyperinflation. Generalized interstitial coarsening appears chronic. No airspace consolidation. No effusions. Normal pulmonary vasculature. Hilar, mediastinal and cardiac contours are unremarkable and unchanged. The transvenous cardiac leads appear grossly intact. IMPRESSION: Stable hyperinflation and chronic interstitial  coarsening. No consolidation, effusion or evidence of CHF. Electronically Signed   By: Andreas Newport M.D.   On: 05/19/2016 04:31   Ct Angio Chest Pe W Or Wo Contrast  Result Date: 05/20/2016 CLINICAL DATA:  Short of breath EXAM: CT ANGIOGRAPHY CHEST WITH CONTRAST TECHNIQUE: Multidetector CT imaging of the chest was performed using the standard protocol during bolus administration of intravenous contrast. Multiplanar CT image reconstructions and MIPs were obtained to evaluate the vascular anatomy. CONTRAST:  100 mL Isovue 370 IV COMPARISON:  Chest two-view 05/19/2016 FINDINGS: Cardiovascular: Negative for pulmonary embolism. Mild pulmonary artery enlargement likely due to pulmonary artery hypertension secondary to COPD. Aorta not adequately opacified for thorough evaluation. Aorta is diffusely atherosclerotic. Ascending aorta measures 33 mm in diameter and descending aorta measures 25 mm in diameter. Dual lead pacemaker with leads in the right atrium and right ventricle. Mediastinum/Nodes: Calcified aortopulmonary  window lymph node compatible with granulomatous disease. Calcified granulomata in the left upper lobe posteriorly. No malignant adenopathy or mass in the mediastinum. Lungs/Pleura: Severe COPD with emphysema. 4 mm and 6 mm calcified granulomata of left upper lobe posteriorly. Negative for mass lesion. Negative for pneumonia or effusion. Upper Abdomen: 3 cm left upper lobe lesion, possibly a renal cyst but incompletely evaluated. Atherosclerotic aorta. Musculoskeletal: No acute skeletal abnormality. Review of the MIP images confirms the above findings. IMPRESSION: Negative for pulmonary embolism Severe COPD with emphysema. Lungs are clear without evidence of infiltrate or effusion Atherosclerotic calcification in the aorta and coronary arteries. Electronically Signed   By: Franchot Gallo M.D.   On: 05/20/2016 15:19        Scheduled Meds: . aspirin EC  81 mg Oral Daily  . calcium carbonate  1 tablet Oral Q breakfast  . furosemide  20 mg Intravenous Daily  . isosorbide mononitrate  30 mg Oral Daily  . losartan  25 mg Oral Daily  . magnesium oxide  200 mg Oral Daily  . mouth rinse  15 mL Mouth Rinse BID  . metoprolol  25 mg Oral BID  . omega-3 acid ethyl esters  1 g Oral BID  . potassium chloride  10 mEq Oral Daily   Continuous Infusions: . heparin 750 Units/hr (05/19/16 2000)     LOS: 1 day    Time spent: 35 mins     Ankit Arsenio Loader, MD [name] Pager [phone]   If 7PM-7AM, please contact night-coverage [email or web address] Password [facility] 05/20/2016, 5:43 PM

## 2016-05-20 NOTE — Progress Notes (Signed)
ANTICOAGULATION CONSULT NOTE - Follow Up Consult  Pharmacy Consult for heparin Indication: chest pain/ACS  Allergies  Allergen Reactions  . Ace Inhibitors Cough  . Hydrocodone-Acetaminophen Other (See Comments)     GI distress    Patient Measurements: Height: 5\' 3"  (160 cm) Weight: 110 lb 3.7 oz (50 kg) IBW/kg (Calculated) : 52.4 Heparin Dosing Weight: 50  Vital Signs: Temp: 98.9 F (37.2 C) (03/08 0741) Temp Source: Oral (03/08 0741) BP: 113/62 (03/08 0845) Pulse Rate: 80 (03/08 0845)  Labs:  Recent Labs  05/19/16 0410 05/19/16 0731 05/19/16 1224 05/19/16 1508 05/19/16 1914 05/20/16 0101 05/20/16 0821  HGB 13.1  --   --   --   --  13.0  --   HCT 39.0  --   --   --   --  40.0  --   PLT 200  --   --   --   --  202  --   HEPARINUNFRC  --   --   --  <0.10*  --  0.56 0.48  CREATININE 0.90  --   --   --   --   --  0.84  TROPONINI  --  <0.03 <0.03  --  0.03*  --   --     Estimated Creatinine Clearance: 33.7 mL/min (by C-G formula based on SCr of 0.84 mg/dL).   Assessment: 89 yoF on heparin for ACS. Not on anticoagulation prior to admission. Confirmatory heparin level therapeutic. CBC stable. Per patient, she is experiencing a 'burning' pain in chest, not typical chest pain. No bleeding per patient.  Goal of Therapy:  Heparin level 0.3-0.7 units/ml Monitor platelets by anticoagulation protocol: Yes   Plan:  Continue heparin 750 units/hr Daily Heparin Level and CBC Monitor signs and symptoms of bleeding  Dierdre Harness, BS, PharmD Clinical Pharmacy Resident [phone] (Pager) 05/20/2016 9:32 AM

## 2016-05-20 NOTE — Progress Notes (Signed)
Progress Note  Patient Name: Jamie [name] Date of Encounter: 05/20/2016  Primary Cardiologist: Dr Caryl Comes  Patient Profile     83 y.o. female w/ hx DNR with a history of complete heart block s/p pacemaker implantation programmed in the VDD mode. GERD,  Device generator replacement in fall 2010. She has CAD with complete LAD infarct, oxygen dependent COPD. EF40% +WMA 2015 echo  Subjective   Not breathing as well as usual. Has had intermittent chest pain and some nausea since admit. Temp at home was elevated, normal here. Has had some sweats and chills, none now.   Inpatient Medications    Scheduled Meds: . aspirin EC  81 mg Oral Daily  . calcium carbonate  1 tablet Oral Q breakfast  . furosemide  20 mg Intravenous Daily  . losartan  25 mg Oral Daily  . magnesium oxide  200 mg Oral Daily  . mouth rinse  15 mL Mouth Rinse BID  . metoprolol  25 mg Oral Daily  . omega-3 acid ethyl esters  1 g Oral BID  . potassium chloride  10 mEq Oral Daily   Continuous Infusions: . heparin 750 Units/hr (05/19/16 2000)  . nitroGLYCERIN 15 mcg/min (05/20/16 0722)   PRN Meds: acetaminophen, albuterol, ALPRAZolam, levalbuterol, morphine injection, nitroGLYCERIN, ondansetron (ZOFRAN) IV, polyvinyl alcohol   Vital Signs    Vitals:   05/20/16 0400 05/20/16 0500 05/20/16 0600 05/20/16 0741  BP: 133/62 135/62 (!) 122/54 106/61  Pulse: 64 64 (!) 57 74  Resp: 18 17 16  (!) 22  Temp: 97.5 F (36.4 C)   98.9 F (37.2 C)  TempSrc: Oral   Oral  SpO2: 98% 98% 98% 97%  Weight:      Height:        Intake/Output Summary (Last 24 hours) at 05/20/16 0803 Last data filed at 05/20/16 0600  Gross per 24 hour  Intake           752.97 ml  Output             1375 ml  Net          -622.03 ml   Filed Weights   05/19/16 0402 05/19/16 0910  Weight: 109 lb (49.4 kg) 110 lb 3.7 oz (50 kg)    Telemetry    SR, V pacing with occ PVCs and pairs - Personally Reviewed  ECG    SR, V pacing - Personally  Reviewed  Physical Exam   General: Well developed, well nourished, female appearing in no acute distress. Head: Normocephalic, atraumatic.  Neck: Supple without bruits, JVD 8-9 cm. Lungs:  Resp regular and unlabored, diffusely decreased. Heart: RRR, S1, S2, no S3, S4, no sig murmur; no rub. Abdomen: Soft, non-tender, non-distended with normoactive bowel sounds. No hepatomegaly. No rebound/guarding. No obvious abdominal masses. Extremities: No clubbing, cyanosis, no edema. Distal pedal pulses are 2+ bilaterally. Neuro: Alert and oriented X 3. Moves all extremities spontaneously. Psych: Normal affect.  Labs    Hematology Recent Labs Lab 05/19/16 0410 05/20/16 0101  WBC 4.6 6.4  RBC 4.11 4.16  HGB 13.1 13.0  HCT 39.0 40.0  MCV 94.9 96.2  MCH 31.9 31.3  MCHC 33.6 32.5  RDW 13.1 13.3  PLT 200 202    Chemistry Recent Labs Lab 05/19/16 0410  NA 136  K 3.5  CL 99*  CO2 28  GLUCOSE 106*  BUN 15  CREATININE 0.90  CALCIUM 9.3  PROT 6.1*  ALBUMIN 3.5  AST 20  ALT 15  ALKPHOS 70  BILITOT 0.7  GFRNONAA 54*  GFRAA >60  ANIONGAP 9     Cardiac Enzymes Recent Labs Lab 05/19/16 0731 05/19/16 1224 05/19/16 1914  TROPONINI <0.03 <0.03 0.03*    Recent Labs Lab 05/19/16 0417  TROPIPOC 0.01     BNP Recent Labs Lab 05/19/16 0410  BNP 166.9*     DDimer  Recent Labs Lab 05/19/16 1508  DDIMER 0.65*     Radiology    Dg Chest 2 View  Result Date: 05/19/2016 CLINICAL DATA:  Midchest pain and dyspnea tonight EXAM: CHEST  2 VIEW COMPARISON:  08/04/2014 FINDINGS: Stable hyperinflation. Generalized interstitial coarsening appears chronic. No airspace consolidation. No effusions. Normal pulmonary vasculature. Hilar, mediastinal and cardiac contours are unremarkable and unchanged. The transvenous cardiac leads appear grossly intact. IMPRESSION: Stable hyperinflation and chronic interstitial coarsening. No consolidation, effusion or evidence of CHF. Electronically  Signed   By: Andreas Newport M.D.   On: 05/19/2016 04:31     Cardiac Studies   ECHO: ordered   CATH: n/a   Patient Profile     83 y.o. female w/ hx DNR with a history of complete heart block s/p pacemaker implantation programmed in the VDD mode. GERD,  Device generator replacement in fall 2010. She has CAD with complete LAD infarct, oxygen dependent COPD. EF 40% 2015 echo  Assessment & Plan    1. Chest pain Troponin are negative x 3.   Her most recent Cath in August 2012 was without obstructive disease, 2014 ECHO displayed regional wall motion abnormality concerning for ischemic etiology; patient opted for medical management.  -  no indication for heart catheterization in this elderly woman with severe COPD on home O2 who had minor noobstructive CAD at last cath -  Cont aspirin daily, Heparin drip, Nitro drip, Lopressor 25 mg daily -  Not currently on statin but on omega-3 fatty acid, continue. -  Echo pending -  cont 20 mg IV lasix daily for now, may be able to change to po in am -  d-dimer mildly elevated, discuss eval with MD  2.Chronic combined systolic and diastolic heart failure/ischemic cardiomyopathy -  Echo pending -  Daily BMET while on IV Lasix  3. Hypertension (max: 170/61) ( Min: 101/43)  - pt is on Nitro drip and Metoprolol. Will add IV lasix.  4. Pacemaker-Medtronic  5. Dyslipidemia - on omega-3 fatty acids  COPD  Medicine to manage  Fever  Medicine to manage   Signed, Rosaria Ferries , PA-C 8:03 AM 05/20/2016 Pager: [phone]  Patient seen, examined. Available data reviewed. Agree with findings, assessment, and plan as outlined by Rosaria Ferries, PA-C. Exam reveals elderly frail woman in NAD. States 'I feel terrible.' JVP normal, lungs with poor air movement throughout, heart RRR distant no murmur, extremities without edema.   Data reviewed. D-dimer mildly increased. Trop (-). Echo pending. Continue IV lasix 20 mg daily. Transition IV NTG  to low-dose Imdur. Increase metoprolol to 25 mg BID. BP now well-controlled. Consider CT-PE study with mildly elevated D-dimer and shortness of breath - d/w Dr Reesa Chew. Continue heparin for now. Will follow up in am.   I don't think this is ACS - see discussion yesterday's consult note. OK to stop heparin after PE evaluation done.   Sherren Mocha, M.D. 05/20/2016 10:06 AM

## 2016-05-21 DIAGNOSIS — I10 Essential (primary) hypertension: Secondary | ICD-10-CM

## 2016-05-21 DIAGNOSIS — I255 Ischemic cardiomyopathy: Secondary | ICD-10-CM

## 2016-05-21 LAB — BASIC METABOLIC PANEL
Anion gap: 8 (ref 5–15)
BUN: 14 mg/dL (ref 6–20)
CO2: 32 mmol/L (ref 22–32)
Calcium: 8.9 mg/dL (ref 8.9–10.3)
Chloride: 94 mmol/L — ABNORMAL LOW (ref 101–111)
Creatinine, Ser: 0.97 mg/dL (ref 0.44–1.00)
GFR calc Af Amer: 57 mL/min — ABNORMAL LOW (ref >60)
GFR, EST NON AFRICAN AMERICAN: 49 mL/min — AB (ref >60)
Glucose, Bld: 104 mg/dL — ABNORMAL HIGH (ref 65–99)
POTASSIUM: 4.2 mmol/L (ref 3.5–5.1)
SODIUM: 134 mmol/L — AB (ref 135–145)

## 2016-05-21 LAB — CBC
HCT: 37.6 % (ref 36.0–46.0)
Hemoglobin: 12.2 g/dL (ref 12.0–15.0)
MCH: 31 pg (ref 26.0–34.0)
MCHC: 32.4 g/dL (ref 30.0–36.0)
MCV: 95.4 fL (ref 78.0–100.0)
PLATELETS: 198 10*3/uL (ref 150–400)
RBC: 3.94 MIL/uL (ref 3.87–5.11)
RDW: 12.8 % (ref 11.5–15.5)
WBC: 7.2 10*3/uL (ref 4.0–10.5)

## 2016-05-21 LAB — HEPARIN LEVEL (UNFRACTIONATED): Heparin Unfractionated: <0.1 IU/mL — ABNORMAL LOW (ref 0.30–0.70)

## 2016-05-21 MED ORDER — FUROSEMIDE 20 MG PO TABS: 20.0000 mg | ORAL_TABLET | ORAL | Status: DC

## 2016-05-21 MED ORDER — FUROSEMIDE 20 MG PO TABS
20.0000 mg | ORAL_TABLET | Freq: Every day | ORAL | Status: DC
  Administered 2016-05-21: 20 mg via ORAL
  Filled 2016-05-21 (×2): qty 1

## 2016-05-21 MED ORDER — FUROSEMIDE 40 MG PO TABS
40.0000 mg | ORAL_TABLET | ORAL | Status: DC
  Administered 2016-05-22: 40 mg via ORAL
  Filled 2016-05-21: qty 1

## 2016-05-21 MED ORDER — PREDNISONE 20 MG PO TABS
40.0000 mg | ORAL_TABLET | Freq: Every day | ORAL | Status: DC
  Administered 2016-05-21 – 2016-05-22 (×2): 40 mg via ORAL
  Filled 2016-05-21 (×2): qty 2

## 2016-05-21 NOTE — Care Management Note (Addendum)
Case Management Note  Patient Details  Name: SHAREL BEHNE MRN: [id number] Date of Birth: 1922/12/07  Subjective/Objective:                 Spoke with patient at the bedside. She states she lives at home alone. She describes herself as independent. She states that in November, she fainted and has had her dauhters drive her to appointments etc since. Patient hopes to drive again. Patient states she has a cane she uses when walking outside on grass, she states she has home oxygen through [facility]. She does not endorse actively having HH, but has used [facility] in the past and would be open to using them again in the future if needed.    Action/Plan:  CM will continue to follow.   Addendum 3-10 Cheray Pardi RN CM DC to home per MD order, no CM needs at time of this note.  Expected Discharge Date:                  Expected Discharge Plan:     In-House Referral:  Clinical Social Work  Discharge planning Services  CM Consult  Post Acute Care Choice:    Choice offered to:     DME Arranged:    DME Agency:     HH Arranged:    HH Agency:     Status of Service:  In process, will continue to follow  If discussed at Long Length of Stay Meetings, dates discussed:    Additional Comments:  Carles Collet, RN 05/21/2016, 5:09 PM

## 2016-05-21 NOTE — Progress Notes (Addendum)
Progress Note  Patient Name: Jamie [name] Date of Encounter: 05/21/2016  Primary Cardiologist: Dr Caryl Comes  Patient Profile     83 y.o. female w/ hx DNR with a history of complete heart block s/p pacemaker implantation programmed in the VDD mode. GERD,  Device generator replacement in fall 2010. She has CAD with complete LAD infarct, oxygen dependent COPD. EF40% +WMA 2015 echo. Admitted w/ CP  Subjective   No more CP, no chills or sweats, breathing well>>at baseline  Inpatient Medications    Scheduled Meds: . aspirin EC  81 mg Oral Daily  . azithromycin  500 mg Intravenous Q24H  . calcium carbonate  1 tablet Oral Q breakfast  . cefTRIAXone (ROCEPHIN)  IV  1 g Intravenous Q24H  . dextromethorphan-guaiFENesin  1 tablet Oral BID  . furosemide  20 mg Intravenous Daily  . heparin subcutaneous  5,000 Units Subcutaneous Q8H  . ipratropium-albuterol  3 mL Nebulization TID  . isosorbide mononitrate  30 mg Oral Daily  . losartan  25 mg Oral Daily  . magnesium oxide  200 mg Oral Daily  . mouth rinse  15 mL Mouth Rinse BID  . metoprolol  25 mg Oral BID  . omega-3 acid ethyl esters  1 g Oral BID  . pantoprazole  40 mg Oral Daily  . potassium chloride  10 mEq Oral Daily   Continuous Infusions:  PRN Meds: acetaminophen, ALPRAZolam, levalbuterol, morphine injection, nitroGLYCERIN, ondansetron (ZOFRAN) IV, polyvinyl alcohol   Vital Signs    Vitals:   05/20/16 1830 05/20/16 1944 05/20/16 2302 05/21/16 0400  BP: (!) 97/52 (!) 120/47 (!) 95/43   Pulse: 83 70 65   Resp: 20 12 (!) 22   Temp:  98.6 F (37 C) 97.9 F (36.6 C) 97.8 F (36.6 C)  TempSrc:  Oral Oral Oral  SpO2: (!) 85% 96% 96%   Weight:      Height:        Intake/Output Summary (Last 24 hours) at 05/21/16 0735 Last data filed at 05/20/16 2200  Gross per 24 hour  Intake           414.52 ml  Output                0 ml  Net           414.52 ml   Filed Weights   05/19/16 0402 05/19/16 0910  Weight: 109 lb (49.4 kg)  110 lb 3.7 oz (50 kg)    Telemetry    SR, V pacing with occ PVCs and pairs - Personally Reviewed  ECG    SR, V pacing - Personally Reviewed  Physical Exam   General: Well developed, well nourished, female appearing in no acute distress. Head: Normocephalic, atraumatic.  Neck: Supple without bruits, JVD 8 cm. Lungs:  Resp regular and unlabored, diffusely decreased. Heart: RRR, S1, S2, no S3, S4, ?soft murmur; no rub. Abdomen: Soft, non-tender, non-distended with normoactive bowel sounds. No hepatomegaly. No rebound/guarding. No obvious abdominal masses. Extremities: No clubbing, cyanosis, no edema. Distal pedal pulses are 2+ bilaterally. Neuro: Alert and oriented X 3. Moves all extremities spontaneously. Psych: Normal affect.  Labs    Hematology  Recent Labs Lab 05/19/16 0410 05/20/16 0101 05/21/16 0224  WBC 4.6 6.4 7.2  RBC 4.11 4.16 3.94  HGB 13.1 13.0 12.2  HCT 39.0 40.0 37.6  MCV 94.9 96.2 95.4  MCH 31.9 31.3 31.0  MCHC 33.6 32.5 32.4  RDW 13.1 13.3 12.8  PLT 200  202 198    Chemistry  Recent Labs Lab 05/19/16 0410 05/20/16 0821 05/21/16 0224  NA 136 136 134*  K 3.5 4.1 4.2  CL 99* 97* 94*  CO2 28 31 32  GLUCOSE 106* 98 104*  BUN 15 12 14   CREATININE 0.90 0.84 0.97  CALCIUM 9.3 8.9 8.9  PROT 6.1*  --   --   ALBUMIN 3.5  --   --   AST 20  --   --   ALT 15  --   --   ALKPHOS 70  --   --   BILITOT 0.7  --   --   GFRNONAA 54* 59* 49*  GFRAA >60 >60 57*  ANIONGAP 9 8 8      Cardiac Enzymes  Recent Labs Lab 05/19/16 0731 05/19/16 1224 05/19/16 1914  TROPONINI <0.03 <0.03 0.03*     Recent Labs Lab 05/19/16 0417  TROPIPOC 0.01     BNP  Recent Labs Lab 05/19/16 0410  BNP 166.9*     DDimer   Recent Labs Lab 05/19/16 1508  DDIMER 0.65*     Radiology    Dg Chest 2 View Result Date: 05/19/2016 CLINICAL DATA:  Midchest pain and dyspnea tonight EXAM: CHEST  2 VIEW COMPARISON:  08/04/2014 FINDINGS: Stable hyperinflation.  Generalized interstitial coarsening appears chronic. No airspace consolidation. No effusions. Normal pulmonary vasculature. Hilar, mediastinal and cardiac contours are unremarkable and unchanged. The transvenous cardiac leads appear grossly intact. IMPRESSION: Stable hyperinflation and chronic interstitial coarsening. No consolidation, effusion or evidence of CHF. Electronically Signed   By: Andreas Newport M.D.   On: 05/19/2016 04:31   Ct Angio Chest Pe W Or Wo Contrast Result Date: 05/20/2016 CLINICAL DATA:  Short of breath EXAM: CT ANGIOGRAPHY CHEST WITH CONTRAST TECHNIQUE: Multidetector CT imaging of the chest was performed using the standard protocol during bolus administration of intravenous contrast. Multiplanar CT image reconstructions and MIPs were obtained to evaluate the vascular anatomy. CONTRAST:  100 mL Isovue 370 IV COMPARISON:  Chest two-view 05/19/2016 FINDINGS: Cardiovascular: Negative for pulmonary embolism. Mild pulmonary artery enlargement likely due to pulmonary artery hypertension secondary to COPD. Aorta not adequately opacified for thorough evaluation. Aorta is diffusely atherosclerotic. Ascending aorta measures 33 mm in diameter and descending aorta measures 25 mm in diameter. Dual lead pacemaker with leads in the right atrium and right ventricle. Mediastinum/Nodes: Calcified aortopulmonary window lymph node compatible with granulomatous disease. Calcified granulomata in the left upper lobe posteriorly. No malignant adenopathy or mass in the mediastinum. Lungs/Pleura: Severe COPD with emphysema. 4 mm and 6 mm calcified granulomata of left upper lobe posteriorly. Negative for mass lesion. Negative for pneumonia or effusion. Upper Abdomen: 3 cm left upper lobe lesion, possibly a renal cyst but incompletely evaluated. Atherosclerotic aorta. Musculoskeletal: No acute skeletal abnormality. Review of the MIP images confirms the above findings. IMPRESSION: Negative for pulmonary embolism  Severe COPD with emphysema. Lungs are clear without evidence of infiltrate or effusion Atherosclerotic calcification in the aorta and coronary arteries. Electronically Signed   By: Franchot Gallo M.D.   On: 05/20/2016 15:19     Cardiac Studies   ECHO: 05/20/2016 - Left ventricle: The cavity size was normal. Wall thickness was   normal. Systolic function was normal. The estimated ejection   fraction was in the range of 55% to 60%. Wall motion was normal;   there were no regional wall motion abnormalities. Doppler   parameters are consistent with abnormal left ventricular   relaxation (grade 1 diastolic  dysfunction). - Aortic valve: There was trivial regurgitation. - Mitral valve: Calcified annulus. Mildly thickened leaflets . - Atrial septum: There was an atrial septal aneurysm. Impressions: - Normal LV systolic function; grade 1 diastolic dysfunction;   calcified aortic valve with trace AI; mild TR.   CATH: n/a   Patient Profile     83 y.o. female w/ hx DNR with a history of complete heart block s/p pacemaker implantation programmed in the VDD mode. GERD,  Device generator replacement in fall 2010. She has CAD with complete LAD infarct, oxygen dependent COPD. EF 40% 2015 echo  Assessment & Plan    1. Chest pain Troponin are negative x 3.   Her most recent Cath in August 2012 was without obstructive disease, ECHO this admit w/ no regional wall motion abnormality; patient previously opted for medical management.  -  no indication for heart catheterization in this elderly woman with severe COPD on home O2 who had minor noobstructive CAD at last cath -  Cont aspirin daily -  Now off Heparin drip; on Imdur now, Lopressor 25 mg bid -  Not currently on statin but on omega-3 fatty acid, continue. -  Has been on 20 mg IV lasix daily, change to po today, d/c Kdur -  d-dimer mildly elevated, but CT neg PE - Pt stable from a cardiac standpoint  2. Acute on chronic combined systolic and  diastolic heart failure/ischemic cardiomyopathy -  Echo w/ nl EF, no WMA -  Renal function stable w/ diuresis -  Change to po Lasix  3. Hypertension  - on Cozaar 25 mg qd and Lopressor 25 mg bid - BP currently well-controlled  4. Pacemaker-Medtronic  5. Dyslipidemia - on omega-3 fatty acids  6. COPD  Medicine to manage  7. Fever  Medicine to manage    Signed, Lenoard Aden 7:35 AM 05/21/2016 Pager: [phone]  Patient seen, examined. Available data reviewed. Agree with findings, assessment, and plan as outlined by Rosaria Ferries, PA-C. The patient looks much better today. This elderly woman is in no distress, lung fields are clear but have markedly diminished breath sounds throughout, the heart is regular rate and rhythm, there is no pretibial edema. I have reviewed all of her studies including her CT angiogram of the chest and her echocardiogram. Her LV function is normal which is quite encouraging considering the presence of moderate segmental LV dysfunction in 2015. We discussed treatment strategies at length today. I think she probably had a component of diastolic heart failure on top of her severe O2 dependent chronic lung disease. We discussed sodium restriction at length. She is going to alternate furosemide 20 mg every other day with furosemide 40 mg. She will need a close eye On her renal function but hopefully this regimen will work well for her. Also we'll discontinue isosorbide as I don't think her symptoms have been consistent with acute coronary syndrome/angina. She can use sublingual nitroglycerin as needed and if frequent chest discomfort occurs it would be reasonable to resume isosorbide. Will arrange outpatient follow-up with Dr. Caryl Comes or his PA/NP within 2 weeks of hospital discharge. The patient will also follow closely with Dr. Joylene Draft.  Sherren Mocha, M.D. 05/21/2016 11:30 AM

## 2016-05-21 NOTE — Progress Notes (Addendum)
PROGRESS NOTE    Jamie [name]  VFI:[id number] DOB: Feb 28, 1923 DOA: 05/19/2016 PCP: Jerlyn Ly, MD   Brief Narrative:  83 year old female with past medical history of hypertension, COPD with emphysema, dyslipidemia, chronic respiratory failure on home oxygen came to the hospital with complaints of chest pain possible concerns of ACS. Cardiology was consulted who did not seem to think patient was having ACS and echo was ordered. CTA of the chest was ordered as her d-dimer was elevated.   Assessment & Plan:   Principal Problem:   Chest pain Active Problems:   HTN (hypertension)   COPD (chronic obstructive pulmonary disease) with emphysema (HCC)   Pacemaker-Medtronic   Dyslipidemia   Chronic respiratory failure with hypoxia, on home O2 therapy (HCC)   History of LAD infarction   Fever   Chronic combined systolic and diastolic heart failure, NYHA class 2 (HCC)   Ischemic cardiomyopathy  Atypical chest pain -Low concerns for ACS, cardiology has been following the patient -Echocardiogram - Normal LV systolic function; grade 1 diastolic dysfunction; calcified aortic valve with trace AI; mild TR. -Nitroglycerin when necessary for chest pain -D-dimer elevated but CT of the chest is negative for PE. CT of the chest does show patient has emphysematous changes. -DuoNeb's as needed/Xopenex -She states Imdur has helped her. We'll continue and monitor for now. Uptitrate metoprolol as needed -PPI has helped her chest pain. We'll continue at this time -requested ambulatory pulse ox  Possible acute bacterial bronchitis/Fevers-prior to arrival -Unknown etiology. Possible underlying bacterial bronchitis? -on ceftriaxone and azithromycin at this time due to.Marland Kitchen  COPD/chronic respiratory failure with hypoxia on home oxygen -Continue home nebulizer -Supplemental oxygen as needed -Mucinex if needed. Prednisone 40 mg started for 5 days.  Chronic combined systolic and diastolic  cardiomyopathy-compensated -Lasix IV for now. Plans to change to oral tomorrow morning -Cardiology following -Echocardiogram done on 3/8 shows normal LV function, grade 1 diastolic dysfunction. -Daily weight and strict input and output -Pacemaker-Medtronic in place.  Hypertension -Blood pressure control, stable at this time  Hyperlipidemia -Continue omega-3 fatty acid.  DVT prophylaxis: Subcutaneous heparin Code Status: DO NOT RESUSCITATE Family Communication:  Daughter is present at bedside Disposition Plan: Transfer her to telemetry floor. If continues to do better will likely discharge her in next 24 hours. She will work with physical therapy today.  Consultants:   Cardiology  Antimicrobials:   Ceftriaxone on 3/8  Azithromycin on 3/8   Subjective: No complaints this morning. States her chest pain has pretty much resolved.  Objective: Vitals:   05/21/16 1000 05/21/16 1100 05/21/16 1144 05/21/16 1200  BP: (!) 118/47 91/72 (!) 105/54 (!) 92/53  Pulse: 76 83 76 77  Resp: 17 (!) 26 (!) 22 (!) 26  Temp:   97.6 F (36.4 C)   TempSrc:   Oral   SpO2: 92% 96% 96% 97%  Weight:      Height:        Intake/Output Summary (Last 24 hours) at 05/21/16 1304 Last data filed at 05/21/16 0800  Gross per 24 hour  Intake           588.75 ml  Output                0 ml  Net           588.75 ml   Filed Weights   05/19/16 0402 05/19/16 0910  Weight: 49.4 kg (109 lb) 50 kg (110 lb 3.7 oz)    Examination:  General exam: Appears calm  and comfortable  Respiratory system: Clear to auscultation. Respiratory effort normal. Cardiovascular system: S1 & S2 heard, RRR. No JVD, murmurs, rubs, gallops or clicks. No pedal edema. Gastrointestinal system: Abdomen is nondistended, soft and nontender. No organomegaly or masses felt. Normal bowel sounds heard. Central nervous system: Alert and oriented. No focal neurological deficits. Extremities: Symmetric 5 x 5 power. Skin: No rashes,  lesions or ulcers Psychiatry: Judgement and insight appear normal. Mood & affect appropriate.     Data Reviewed:   CBC:  Recent Labs Lab 05/19/16 0410 05/20/16 0101 05/21/16 0224  WBC 4.6 6.4 7.2  NEUTROABS 2.2  --   --   HGB 13.1 13.0 12.2  HCT 39.0 40.0 37.6  MCV 94.9 96.2 95.4  PLT 200 202 371   Basic Metabolic Panel:  Recent Labs Lab 05/19/16 0410 05/20/16 0101 05/20/16 0821 05/21/16 0224  NA 136  --  136 134*  K 3.5  --  4.1 4.2  CL 99*  --  97* 94*  CO2 28  --  31 32  GLUCOSE 106*  --  98 104*  BUN 15  --  12 14  CREATININE 0.90  --  0.84 0.97  CALCIUM 9.3  --  8.9 8.9  MG  --  2.0  --   --    GFR: Estimated Creatinine Clearance: 29.2 mL/min (by C-G formula based on SCr of 0.97 mg/dL). Liver Function Tests:  Recent Labs Lab 05/19/16 0410  AST 20  ALT 15  ALKPHOS 70  BILITOT 0.7  PROT 6.1*  ALBUMIN 3.5    Recent Labs Lab 05/19/16 0410  LIPASE 34   No results for input(s): AMMONIA in the last 168 hours. Coagulation Profile: No results for input(s): INR, PROTIME in the last 168 hours. Cardiac Enzymes:  Recent Labs Lab 05/19/16 0731 05/19/16 1224 05/19/16 1914  TROPONINI <0.03 <0.03 0.03*   BNP (last 3 results) No results for input(s): PROBNP in the last 8760 hours. HbA1C: No results for input(s): HGBA1C in the last 72 hours. CBG: No results for input(s): GLUCAP in the last 168 hours. Lipid Profile: No results for input(s): CHOL, HDL, LDLCALC, TRIG, CHOLHDL, LDLDIRECT in the last 72 hours. Thyroid Function Tests: No results for input(s): TSH, T4TOTAL, FREET4, T3FREE, THYROIDAB in the last 72 hours. Anemia Panel: No results for input(s): VITAMINB12, FOLATE, FERRITIN, TIBC, IRON, RETICCTPCT in the last 72 hours. Sepsis Labs: No results for input(s): PROCALCITON, LATICACIDVEN in the last 168 hours.  Recent Results (from the past 240 hour(s))  Culture, Urine     Status: Abnormal   Collection Time: 05/19/16  8:36 AM  Result Value  Ref Range Status   Specimen Description URINE, CLEAN CATCH  Final   Special Requests NONE  Final   Culture MULTIPLE SPECIES PRESENT, SUGGEST RECOLLECTION (A)  Final   Report Status 05/20/2016 FINAL  Final  MRSA PCR Screening     Status: None   Collection Time: 05/19/16  9:15 AM  Result Value Ref Range Status   MRSA by PCR NEGATIVE NEGATIVE Final    Comment:        The GeneXpert MRSA Assay (FDA approved for NASAL specimens only), is one component of a comprehensive MRSA colonization surveillance program. It is not intended to diagnose MRSA infection nor to guide or monitor treatment for MRSA infections.          Radiology Studies: Ct Angio Chest Pe W Or Wo Contrast  Result Date: 05/20/2016 CLINICAL DATA:  Short of breath EXAM:  CT ANGIOGRAPHY CHEST WITH CONTRAST TECHNIQUE: Multidetector CT imaging of the chest was performed using the standard protocol during bolus administration of intravenous contrast. Multiplanar CT image reconstructions and MIPs were obtained to evaluate the vascular anatomy. CONTRAST:  100 mL Isovue 370 IV COMPARISON:  Chest two-view 05/19/2016 FINDINGS: Cardiovascular: Negative for pulmonary embolism. Mild pulmonary artery enlargement likely due to pulmonary artery hypertension secondary to COPD. Aorta not adequately opacified for thorough evaluation. Aorta is diffusely atherosclerotic. Ascending aorta measures 33 mm in diameter and descending aorta measures 25 mm in diameter. Dual lead pacemaker with leads in the right atrium and right ventricle. Mediastinum/Nodes: Calcified aortopulmonary window lymph node compatible with granulomatous disease. Calcified granulomata in the left upper lobe posteriorly. No malignant adenopathy or mass in the mediastinum. Lungs/Pleura: Severe COPD with emphysema. 4 mm and 6 mm calcified granulomata of left upper lobe posteriorly. Negative for mass lesion. Negative for pneumonia or effusion. Upper Abdomen: 3 cm left upper lobe lesion,  possibly a renal cyst but incompletely evaluated. Atherosclerotic aorta. Musculoskeletal: No acute skeletal abnormality. Review of the MIP images confirms the above findings. IMPRESSION: Negative for pulmonary embolism Severe COPD with emphysema. Lungs are clear without evidence of infiltrate or effusion Atherosclerotic calcification in the aorta and coronary arteries. Electronically Signed   By: Franchot Gallo M.D.   On: 05/20/2016 15:19        Scheduled Meds: . aspirin EC  81 mg Oral Daily  . azithromycin  500 mg Intravenous Q24H  . calcium carbonate  1 tablet Oral Q breakfast  . cefTRIAXone (ROCEPHIN)  IV  1 g Intravenous Q24H  . dextromethorphan-guaiFENesin  1 tablet Oral BID  . furosemide  20 mg Oral Daily  . heparin subcutaneous  5,000 Units Subcutaneous Q8H  . ipratropium-albuterol  3 mL Nebulization TID  . isosorbide mononitrate  30 mg Oral Daily  . losartan  25 mg Oral Daily  . magnesium oxide  200 mg Oral Daily  . mouth rinse  15 mL Mouth Rinse BID  . metoprolol  25 mg Oral BID  . omega-3 acid ethyl esters  1 g Oral BID  . pantoprazole  40 mg Oral Daily  . predniSONE  40 mg Oral Q breakfast   Continuous Infusions:    LOS: 2 days    Time spent: 35 mins     Ankit Arsenio Loader, MD [name] Pager [phone]   If 7PM-7AM, please contact night-coverage [email or web address] Password [email or web address] 05/21/2016, 1:04 PM

## 2016-05-21 NOTE — Progress Notes (Signed)
Patient arrived on the unit from 4N assessment completed see flowsheet, placed on tele ccmd notified, patient oriented to room and staff, bed in lowest position, call bell within reach will continue to monitor.

## 2016-05-22 DIAGNOSIS — R079 Chest pain, unspecified: Secondary | ICD-10-CM

## 2016-05-22 DIAGNOSIS — I5031 Acute diastolic (congestive) heart failure: Secondary | ICD-10-CM

## 2016-05-22 LAB — BASIC METABOLIC PANEL
Anion gap: 10 (ref 5–15)
BUN: 13 mg/dL (ref 6–20)
CALCIUM: 9.3 mg/dL (ref 8.9–10.3)
CHLORIDE: 94 mmol/L — AB (ref 101–111)
CO2: 30 mmol/L (ref 22–32)
CREATININE: 0.81 mg/dL (ref 0.44–1.00)
GFR calc non Af Amer: >60 mL/min (ref >60)
Glucose, Bld: 108 mg/dL — ABNORMAL HIGH (ref 65–99)
Potassium: 4.2 mmol/L (ref 3.5–5.1)
SODIUM: 134 mmol/L — AB (ref 135–145)

## 2016-05-22 MED ORDER — AMOXICILLIN-POT CLAVULANATE 875-125 MG PO TABS: 1.0000 | ORAL_TABLET | Freq: Two times a day (BID) | ORAL | 0 refills | Status: AC

## 2016-05-22 MED ORDER — FUROSEMIDE 20 MG PO TABS: 20.0000 mg | ORAL_TABLET | ORAL | 0 refills | Status: DC

## 2016-05-22 MED ORDER — PREDNISONE 20 MG PO TABS: 40.0000 mg | ORAL_TABLET | Freq: Every day | ORAL | 0 refills | Status: AC

## 2016-05-22 MED ORDER — PANTOPRAZOLE SODIUM 40 MG PO TBEC: 40.0000 mg | DELAYED_RELEASE_TABLET | Freq: Every day | ORAL | 1 refills | Status: DC

## 2016-05-22 MED ORDER — FUROSEMIDE 40 MG PO TABS: 40.0000 mg | ORAL_TABLET | ORAL | 0 refills | Status: DC

## 2016-05-22 NOTE — Discharge Summary (Signed)
Physician Discharge Summary  Jamie [name] IRW:[id number] DOB: 09-10-1922 DOA: 05/19/2016  PCP: Jerlyn Ly, MD  Admit date: 05/19/2016 Discharge date: 05/22/2016  Admitted From:Home Disposition:  Home  Recommendations for Outpatient Follow-up:  1. Follow up with PCP, she has an appointment this upcoming Thursday. 2. Follow-up with cardiology in 2 weeks. Appointment has been made and listed below 3. Take prednisone for 3 more days 40 mg orally daily. 4. Take Augmentin orally for 2 more days for presumed pneumonia/bronchitis.  5. Started Protonix 40 mg daily for possible GERD. 6. Lasix has been changed to 20 mg orally every other day and 40 mg orally every other day. 7. Continue taking Imdur as prescribed. Can use nitroglycerin sublingual tablets as prescribed for intermittent chest discomfort. If chest discomfort is too frequent please notify her cardiology  Home Health: No Equipment/Devices: Continue home 2 L oxygen nasal cannula  Discharge Condition: Stable CODE STATUS: DO NOT RESUSCITATE Diet recommendation: Heart Healthy / Carb Modified  Brief/Interim Summary: 83 year old female with past medical history of hypertension, COPD with emphysema, chronic respiratory failure on home oxygen, hyperlipidemia came to the ER with complaints of chest pain. She was evaluated by cardiology and placed on heparin drip at first but later was ruled out. She also had elevated d-dimer at the time of admission therefore underwent CTA of the chest which was negative for pulmonary embolism but it did show she has COPD with emphysema modest changes. Due to possible concerns of underlying bronchitis she was started on ceftriaxone/azithromycin which was changed to Augmentin orally to be taken for 3 more days at the time of discharge. She was taken off the heparin drip once ACS and pulmonary embolism was ruled out and started on Protonix for presumed GERD. Protonic seem to have helped her a lot as she reported her  symptoms have resolved. Per cardiology Lasix was changed to 20 mg to be taken every other day and 40 mg be taken every other day. Continue taking Imdur as it should help your chest pain and nitroglycerin sublingual tablets as needed. If the chest discomfort becomes too frequent please notify her cardiology. Due to respiratory issues and concerns of COPD/emphysema she was given 5 day course of prednisone with 3 more days left at the time of discharge. Prescriptions were sent to the pharmacy and I had an extensive discussion with the patient and her daughters regarding her care. Today she is medically stable to be discharged and has reached maximum in hospital stay benefit. She needs to follow-up with cardiology and primary care physician. Cardiology appointment has been made and listed below and patient tells me she has her primary care appointment this upcoming Thursday.   Discharge Diagnoses:  Principal Problem:   Chest pain Active Problems:   HTN (hypertension)   COPD (chronic obstructive pulmonary disease) with emphysema (HCC)   Pacemaker-Medtronic   Dyslipidemia   Chronic respiratory failure with hypoxia, on home O2 therapy (HCC)   History of LAD infarction   Fever   Chronic combined systolic and diastolic heart failure, NYHA class 2 (HCC)   Ischemic cardiomyopathy    Discharge Instructions   Allergies as of 05/22/2016      Reactions   Ace Inhibitors Cough   Hydrocodone-acetaminophen Other (See Comments)    GI distress      Medication List    TAKE these medications   ALPRAZolam 0.5 MG tablet Commonly known as:  XANAX Take 0.5 mg by mouth at bedtime as needed for sleep.  amoxicillin-clavulanate 875-125 MG tablet Commonly known as:  AUGMENTIN Take 1 tablet by mouth 2 (two) times daily.   aspirin EC 81 MG tablet Take 81 mg by mouth daily.   beta carotene w/minerals tablet Take 1 tablet by mouth daily.   CALTRATE 600 1500 (600 Ca) MG Tabs tablet Generic drug:  calcium  carbonate Take 600 mg by mouth daily.   cholecalciferol 1000 units tablet Commonly known as:  VITAMIN D Take 1,000 Units by mouth daily.   CO Q 10 PO Take 1 capsule by mouth daily.   cyanocobalamin 1000 MCG tablet Take 1,000 mcg by mouth daily.   Fish Oil 1200 MG Caps Take 1,200 mg by mouth daily.   furosemide 20 MG tablet Commonly known as:  LASIX Take 1 tablet (20 mg total) by mouth every other day. Start taking on:  05/23/2016 What changed:  when to take this   furosemide 40 MG tablet Commonly known as:  LASIX Take 1 tablet (40 mg total) by mouth every other day. Start taking on:  05/25/2016 What changed:  You were already taking a medication with the same name, and this prescription was added. Make sure you understand how and when to take each.   isosorbide mononitrate 30 MG 24 hr tablet Commonly known as:  IMDUR TAKE 1 TABLET BY MOUTH DAILY   levalbuterol 0.63 MG/3ML nebulizer solution Commonly known as:  XOPENEX Take 0.63 mg by nebulization every 6 (six) hours as needed for shortness of breath.   losartan 25 MG tablet Commonly known as:  COZAAR TAKE ONE TABLET BY MOUTH ONE TIME DAILY   Magnesium 250 MG Tabs Take 250 mg by mouth daily.   metoprolol 50 MG tablet Commonly known as:  LOPRESSOR TAKE 1/2 A TAB BY MOUTH TWICE A DAY What changed:  See the new instructions.   nitroGLYCERIN 0.4 MG SL tablet Commonly known as:  NITROSTAT Place 1 tablet (0.4 mg total) under the tongue every 5 (five) minutes as needed for chest pain.   OSTEO BI-FLEX REGULAR STRENGTH PO Take 2 tablets by mouth daily.   pantoprazole 40 MG tablet Commonly known as:  PROTONIX Take 1 tablet (40 mg total) by mouth daily. Start taking on:  05/23/2016   potassium chloride 10 MEQ tablet Commonly known as:  K-DUR Take 10 mEq by mouth 2 (two) times a week. Tuesday and Friday   predniSONE 20 MG tablet Commonly known as:  DELTASONE Take 2 tablets (40 mg total) by mouth daily with  breakfast. Start taking on:  05/23/2016   PROAIR HFA 108 (90 Base) MCG/ACT inhaler Generic drug:  albuterol Inhale 2 puffs into the lungs every 6 (six) hours as needed for wheezing or shortness of breath.   SYSTANE 0.4-0.3 % Soln Generic drug:  Polyethyl Glycol-Propyl Glycol Place 1 drop into both eyes 2 (two) times daily as needed (dry eyes).      Follow-up Information    Baldwin Jamaica, PA-C Follow up on 05/27/2016.   Specialty:  Cardiology Why:  Please arrive at 8:15 am for an 8:30 am appointment. Contact information: [address] [place] [address] [phone]        Jerlyn Ly, MD Follow up in 1 week(s).   Specialty:  Internal Medicine Contact information: [place] [address] [phone]          Allergies  Allergen Reactions  . Ace Inhibitors Cough  . Hydrocodone-Acetaminophen Other (See Comments)     GI distress  Consultations:  Cardiology   Procedures/Studies: Dg Chest 2 View  Result Date: 05/19/2016 CLINICAL DATA:  Midchest pain and dyspnea tonight EXAM: CHEST  2 VIEW COMPARISON:  08/04/2014 FINDINGS: Stable hyperinflation. Generalized interstitial coarsening appears chronic. No airspace consolidation. No effusions. Normal pulmonary vasculature. Hilar, mediastinal and cardiac contours are unremarkable and unchanged. The transvenous cardiac leads appear grossly intact. IMPRESSION: Stable hyperinflation and chronic interstitial coarsening. No consolidation, effusion or evidence of CHF. Electronically Signed   By: Andreas Newport M.D.   On: 05/19/2016 04:31   Ct Angio Chest Pe W Or Wo Contrast  Result Date: 05/20/2016 CLINICAL DATA:  Short of breath EXAM: CT ANGIOGRAPHY CHEST WITH CONTRAST TECHNIQUE: Multidetector CT imaging of the chest was performed using the standard protocol during bolus administration of intravenous contrast. Multiplanar CT image reconstructions and MIPs were obtained to evaluate the vascular  anatomy. CONTRAST:  100 mL Isovue 370 IV COMPARISON:  Chest two-view 05/19/2016 FINDINGS: Cardiovascular: Negative for pulmonary embolism. Mild pulmonary artery enlargement likely due to pulmonary artery hypertension secondary to COPD. Aorta not adequately opacified for thorough evaluation. Aorta is diffusely atherosclerotic. Ascending aorta measures 33 mm in diameter and descending aorta measures 25 mm in diameter. Dual lead pacemaker with leads in the right atrium and right ventricle. Mediastinum/Nodes: Calcified aortopulmonary window lymph node compatible with granulomatous disease. Calcified granulomata in the left upper lobe posteriorly. No malignant adenopathy or mass in the mediastinum. Lungs/Pleura: Severe COPD with emphysema. 4 mm and 6 mm calcified granulomata of left upper lobe posteriorly. Negative for mass lesion. Negative for pneumonia or effusion. Upper Abdomen: 3 cm left upper lobe lesion, possibly a renal cyst but incompletely evaluated. Atherosclerotic aorta. Musculoskeletal: No acute skeletal abnormality. Review of the MIP images confirms the above findings. IMPRESSION: Negative for pulmonary embolism Severe COPD with emphysema. Lungs are clear without evidence of infiltrate or effusion Atherosclerotic calcification in the aorta and coronary arteries. Electronically Signed   By: Franchot Gallo M.D.   On: 05/20/2016 15:19      Subjective:   Discharge Exam: Vitals:   05/22/16 0500 05/22/16 0913  BP: (!) 131/47 (!) 127/43  Pulse: 73 80  Resp: 16 18  Temp: 98.4 F (36.9 C) 97.7 F (36.5 C)   Vitals:   05/21/16 2100 05/22/16 0500 05/22/16 0727 05/22/16 0913  BP: (!) 120/47 (!) 131/47  (!) 127/43  Pulse: 96 73  80  Resp: 16 16  18   Temp: 98.1 F (36.7 C) 98.4 F (36.9 C)  97.7 F (36.5 C)  TempSrc: Oral Oral  Oral  SpO2: 96% 100% 96% 97%  Weight:      Height:        General: Pt is alert, awake, not in acute distress Cardiovascular: RRR, S1/S2 +, no rubs, no  gallops Respiratory: CTA bilaterally, no wheezing, no rhonchi Abdominal: Soft, NT, ND, bowel sounds + Extremities: no edema, no cyanosis    The results of significant diagnostics from this hospitalization (including imaging, microbiology, ancillary and laboratory) are listed below for reference.     Microbiology: Recent Results (from the past 240 hour(s))  Culture, Urine     Status: Abnormal   Collection Time: 05/19/16  8:36 AM  Result Value Ref Range Status   Specimen Description URINE, CLEAN CATCH  Final   Special Requests NONE  Final   Culture MULTIPLE SPECIES PRESENT, SUGGEST RECOLLECTION (A)  Final   Report Status 05/20/2016 FINAL  Final  MRSA PCR Screening     Status: None   Collection  Time: 05/19/16  9:15 AM  Result Value Ref Range Status   MRSA by PCR NEGATIVE NEGATIVE Final    Comment:        The GeneXpert MRSA Assay (FDA approved for NASAL specimens only), is one component of a comprehensive MRSA colonization surveillance program. It is not intended to diagnose MRSA infection nor to guide or monitor treatment for MRSA infections.      Labs: BNP (last 3 results)  Recent Labs  05/19/16 0410  BNP 585.2*   Basic Metabolic Panel:  Recent Labs Lab 05/19/16 0410 05/20/16 0101 05/20/16 0821 05/21/16 0224 05/22/16 0315  NA 136  --  136 134* 134*  K 3.5  --  4.1 4.2 4.2  CL 99*  --  97* 94* 94*  CO2 28  --  31 32 30  GLUCOSE 106*  --  98 104* 108*  BUN 15  --  12 14 13   CREATININE 0.90  --  0.84 0.97 0.81  CALCIUM 9.3  --  8.9 8.9 9.3  MG  --  2.0  --   --   --    Liver Function Tests:  Recent Labs Lab 05/19/16 0410  AST 20  ALT 15  ALKPHOS 70  BILITOT 0.7  PROT 6.1*  ALBUMIN 3.5    Recent Labs Lab 05/19/16 0410  LIPASE 34   No results for input(s): AMMONIA in the last 168 hours. CBC:  Recent Labs Lab 05/19/16 0410 05/20/16 0101 05/21/16 0224  WBC 4.6 6.4 7.2  NEUTROABS 2.2  --   --   HGB 13.1 13.0 12.2  HCT 39.0 40.0 37.6   MCV 94.9 96.2 95.4  PLT 200 202 198   Cardiac Enzymes:  Recent Labs Lab 05/19/16 0731 05/19/16 1224 05/19/16 1914  TROPONINI <0.03 <0.03 0.03*   BNP: Invalid input(s): POCBNP CBG: No results for input(s): GLUCAP in the last 168 hours. D-Dimer  Recent Labs  05/19/16 1508  DDIMER 0.65*   Hgb A1c No results for input(s): HGBA1C in the last 72 hours. Lipid Profile No results for input(s): CHOL, HDL, LDLCALC, TRIG, CHOLHDL, LDLDIRECT in the last 72 hours. Thyroid function studies No results for input(s): TSH, T4TOTAL, T3FREE, THYROIDAB in the last 72 hours.  Invalid input(s): FREET3 Anemia work up No results for input(s): VITAMINB12, FOLATE, FERRITIN, TIBC, IRON, RETICCTPCT in the last 72 hours. Urinalysis    Component Value Date/Time   COLORURINE YELLOW 05/19/2016 0836   APPEARANCEUR CLEAR 05/19/2016 0836   LABSPEC 1.010 05/19/2016 0836   PHURINE 5.0 05/19/2016 0836   GLUCOSEU NEGATIVE 05/19/2016 0836   HGBUR MODERATE (A) 05/19/2016 0836   BILIRUBINUR NEGATIVE 05/19/2016 0836   KETONESUR 5 (A) 05/19/2016 0836   PROTEINUR NEGATIVE 05/19/2016 0836   UROBILINOGEN 0.2 08/04/2014 2211   NITRITE NEGATIVE 05/19/2016 0836   LEUKOCYTESUR TRACE (A) 05/19/2016 0836   Sepsis Labs Invalid input(s): PROCALCITONIN,  WBC,  LACTICIDVEN Microbiology Recent Results (from the past 240 hour(s))  Culture, Urine     Status: Abnormal   Collection Time: 05/19/16  8:36 AM  Result Value Ref Range Status   Specimen Description URINE, CLEAN CATCH  Final   Special Requests NONE  Final   Culture MULTIPLE SPECIES PRESENT, SUGGEST RECOLLECTION (A)  Final   Report Status 05/20/2016 FINAL  Final  MRSA PCR Screening     Status: None   Collection Time: 05/19/16  9:15 AM  Result Value Ref Range Status   MRSA by PCR NEGATIVE NEGATIVE Final    Comment:  The GeneXpert MRSA Assay (FDA approved for NASAL specimens only), is one component of a comprehensive MRSA colonization surveillance  program. It is not intended to diagnose MRSA infection nor to guide or monitor treatment for MRSA infections.      Time coordinating discharge: Over 30 minutes  SIGNED:   Damita Lack, MD  [name] 05/22/2016, 9:42 AM Pager   If 7PM-7AM, please contact night-coverage [email or web address] Password [email or web address]

## 2016-05-22 NOTE — Progress Notes (Signed)
Patient in a stable condition, discharge education reviewed with patient she verbalised understanding, patient belongings at bedside, iv removed , tele dc ccmd notified, patient taken off the unit on a wheelchair by this RN , daughter to transport patient home

## 2016-05-22 NOTE — Progress Notes (Signed)
PROGRESS NOTE    Jamie [name]  HEN:[id number] DOB: Aug 08, 1922 DOA: 05/19/2016 PCP: Jerlyn Ly, MD   Brief Narrative:  83 year old female with past medical history of hypertension, COPD with emphysema, dyslipidemia, chronic respiratory failure on home oxygen came to the hospital with complaints of chest pain possible concerns of ACS. Cardiology was consulted who did not seem to think patient was having ACS and echo was ordered. CTA of the chest was ordered as her d-dimer was elevated but it ended up being neg. It showed emphysematous changes. She was started on short course of PO steroids.    Assessment & Plan:   Principal Problem:   Chest pain Active Problems:   HTN (hypertension)   COPD (chronic obstructive pulmonary disease) with emphysema (HCC)   Pacemaker-Medtronic   Dyslipidemia   Chronic respiratory failure with hypoxia, on home O2 therapy (HCC)   History of LAD infarction   Fever   Chronic combined systolic and diastolic heart failure, NYHA class 2 (HCC)   Ischemic cardiomyopathy  Atypical chest pain - resolved -Low concerns for ACS, cardiology has been following the patient -Echocardiogram - Normal LV systolic function; grade 1 diastolic dysfunction; calcified aortic valve with trace AI; mild TR. -Nitroglycerin when necessary for chest pain -D-dimer elevated but CT of the chest is negative for PE. CT of the chest does show patient has emphysematous changes. -DuoNeb's as needed/Xopenex -She states Imdur has helped her. We'll continue and monitor for now. Uptitrate metoprolol as needed. Will prescribe her nitroglycerin sublingual when necessary to be used at home as well if necessary -PPI has helped her chest pain. We'll continue at this time -requested ambulatory pulse ox -She can follow-up with Dr.Klein in 2 weeks from cardiology.  Possible acute bacterial bronchitis/Fevers-prior to arrival -Unknown etiology. Possible underlying bacterial bronchitis? -on ceftriaxone  and azithromycin at this time due to. We'll give her 3 days of Augmentin at the time of discharge  COPD/chronic respiratory failure with hypoxia on home oxygen -Continue home nebulizer -Supplemental oxygen as needed -Mucinex if needed. Prednisone 40 mg started for 5 days.  Chronic combined systolic and diastolic cardiomyopathy-compensated -Lasix IV for now. Her Lasix has been changed to 20 mg every other day and 40 mg every other day. -Appreciate cardiology input -Echocardiogram done on 3/8 shows normal LV function, grade 1 diastolic dysfunction. -Daily weight and strict input and output -Pacemaker-Medtronic in place.  Hypertension -Blood pressure control, stable at this time  Hyperlipidemia -Continue omega-3 fatty acid.  DVT prophylaxis: Subcutaneous heparin Code Status: DO NOT RESUSCITATE Family Communication:  Patient comprehends well. Spoke with daughter at extending yesterday. Disposition Plan: Discharge her today  Consultants:   Cardiology  Antimicrobials:   Ceftriaxone on 3/8  Azithromycin on 3/8   Subjective: She denies any complaints this morning. Her symptoms are resolved. All of her questions answered.  Objective: Vitals:   05/21/16 2100 05/22/16 0500 05/22/16 0727 05/22/16 0913  BP: (!) 120/47 (!) 131/47  (!) 127/43  Pulse: 96 73  80  Resp: 16 16  18   Temp: 98.1 F (36.7 C) 98.4 F (36.9 C)  97.7 F (36.5 C)  TempSrc: Oral Oral  Oral  SpO2: 96% 100% 96% 97%  Weight:      Height:        Intake/Output Summary (Last 24 hours) at 05/22/16 3614 Last data filed at 05/22/16 0036  Gross per 24 hour  Intake             1020 ml  Output  0 ml  Net             1020 ml   Filed Weights   05/19/16 0402 05/19/16 0910  Weight: 49.4 kg (109 lb) 50 kg (110 lb 3.7 oz)    Examination:  General exam: Appears calm and comfortable  Respiratory system: Clear to auscultation. Respiratory effort normal. Cardiovascular system: S1 & S2 heard, RRR.  No JVD, murmurs, rubs, gallops or clicks. No pedal edema. Gastrointestinal system: Abdomen is nondistended, soft and nontender. No organomegaly or masses felt. Normal bowel sounds heard. Central nervous system: Alert and oriented. No focal neurological deficits. Extremities: Symmetric 5 x 5 power. Skin: No rashes, lesions or ulcers Psychiatry: Judgement and insight appear normal. Mood & affect appropriate.     Data Reviewed:   CBC:  Recent Labs Lab 05/19/16 0410 05/20/16 0101 05/21/16 0224  WBC 4.6 6.4 7.2  NEUTROABS 2.2  --   --   HGB 13.1 13.0 12.2  HCT 39.0 40.0 37.6  MCV 94.9 96.2 95.4  PLT 200 202 664   Basic Metabolic Panel:  Recent Labs Lab 05/19/16 0410 05/20/16 0101 05/20/16 0821 05/21/16 0224 05/22/16 0315  NA 136  --  136 134* 134*  K 3.5  --  4.1 4.2 4.2  CL 99*  --  97* 94* 94*  CO2 28  --  31 32 30  GLUCOSE 106*  --  98 104* 108*  BUN 15  --  12 14 13   CREATININE 0.90  --  0.84 0.97 0.81  CALCIUM 9.3  --  8.9 8.9 9.3  MG  --  2.0  --   --   --    GFR: Estimated Creatinine Clearance: 35 mL/min (by C-G formula based on SCr of 0.81 mg/dL). Liver Function Tests:  Recent Labs Lab 05/19/16 0410  AST 20  ALT 15  ALKPHOS 70  BILITOT 0.7  PROT 6.1*  ALBUMIN 3.5    Recent Labs Lab 05/19/16 0410  LIPASE 34   No results for input(s): AMMONIA in the last 168 hours. Coagulation Profile: No results for input(s): INR, PROTIME in the last 168 hours. Cardiac Enzymes:  Recent Labs Lab 05/19/16 0731 05/19/16 1224 05/19/16 1914  TROPONINI <0.03 <0.03 0.03*   BNP (last 3 results) No results for input(s): PROBNP in the last 8760 hours. HbA1C: No results for input(s): HGBA1C in the last 72 hours. CBG: No results for input(s): GLUCAP in the last 168 hours. Lipid Profile: No results for input(s): CHOL, HDL, LDLCALC, TRIG, CHOLHDL, LDLDIRECT in the last 72 hours. Thyroid Function Tests: No results for input(s): TSH, T4TOTAL, FREET4, T3FREE,  THYROIDAB in the last 72 hours. Anemia Panel: No results for input(s): VITAMINB12, FOLATE, FERRITIN, TIBC, IRON, RETICCTPCT in the last 72 hours. Sepsis Labs: No results for input(s): PROCALCITON, LATICACIDVEN in the last 168 hours.  Recent Results (from the past 240 hour(s))  Culture, Urine     Status: Abnormal   Collection Time: 05/19/16  8:36 AM  Result Value Ref Range Status   Specimen Description URINE, CLEAN CATCH  Final   Special Requests NONE  Final   Culture MULTIPLE SPECIES PRESENT, SUGGEST RECOLLECTION (A)  Final   Report Status 05/20/2016 FINAL  Final  MRSA PCR Screening     Status: None   Collection Time: 05/19/16  9:15 AM  Result Value Ref Range Status   MRSA by PCR NEGATIVE NEGATIVE Final    Comment:        The GeneXpert MRSA Assay (FDA  approved for NASAL specimens only), is one component of a comprehensive MRSA colonization surveillance program. It is not intended to diagnose MRSA infection nor to guide or monitor treatment for MRSA infections.          Radiology Studies: Ct Angio Chest Pe W Or Wo Contrast  Result Date: 05/20/2016 CLINICAL DATA:  Short of breath EXAM: CT ANGIOGRAPHY CHEST WITH CONTRAST TECHNIQUE: Multidetector CT imaging of the chest was performed using the standard protocol during bolus administration of intravenous contrast. Multiplanar CT image reconstructions and MIPs were obtained to evaluate the vascular anatomy. CONTRAST:  100 mL Isovue 370 IV COMPARISON:  Chest two-view 05/19/2016 FINDINGS: Cardiovascular: Negative for pulmonary embolism. Mild pulmonary artery enlargement likely due to pulmonary artery hypertension secondary to COPD. Aorta not adequately opacified for thorough evaluation. Aorta is diffusely atherosclerotic. Ascending aorta measures 33 mm in diameter and descending aorta measures 25 mm in diameter. Dual lead pacemaker with leads in the right atrium and right ventricle. Mediastinum/Nodes: Calcified aortopulmonary window  lymph node compatible with granulomatous disease. Calcified granulomata in the left upper lobe posteriorly. No malignant adenopathy or mass in the mediastinum. Lungs/Pleura: Severe COPD with emphysema. 4 mm and 6 mm calcified granulomata of left upper lobe posteriorly. Negative for mass lesion. Negative for pneumonia or effusion. Upper Abdomen: 3 cm left upper lobe lesion, possibly a renal cyst but incompletely evaluated. Atherosclerotic aorta. Musculoskeletal: No acute skeletal abnormality. Review of the MIP images confirms the above findings. IMPRESSION: Negative for pulmonary embolism Severe COPD with emphysema. Lungs are clear without evidence of infiltrate or effusion Atherosclerotic calcification in the aorta and coronary arteries. Electronically Signed   By: Franchot Gallo M.D.   On: 05/20/2016 15:19        Scheduled Meds: . aspirin EC  81 mg Oral Daily  . azithromycin  500 mg Intravenous Q24H  . calcium carbonate  1 tablet Oral Q breakfast  . cefTRIAXone (ROCEPHIN)  IV  1 g Intravenous Q24H  . dextromethorphan-guaiFENesin  1 tablet Oral BID  . [START ON 05/23/2016] furosemide  20 mg Oral QODAY  . furosemide  40 mg Oral QODAY  . heparin subcutaneous  5,000 Units Subcutaneous Q8H  . ipratropium-albuterol  3 mL Nebulization TID  . losartan  25 mg Oral Daily  . magnesium oxide  200 mg Oral Daily  . mouth rinse  15 mL Mouth Rinse BID  . metoprolol  25 mg Oral BID  . omega-3 acid ethyl esters  1 g Oral BID  . pantoprazole  40 mg Oral Daily  . predniSONE  40 mg Oral Q breakfast   Continuous Infusions:    LOS: 3 days    Time spent: 35 mins     Arrow Tomko Arsenio Loader, MD [name] Pager [phone]   If 7PM-7AM, please contact night-coverage [email or web address] Password [facility] 05/22/2016, 9:27 AM

## 2016-05-22 NOTE — Evaluation (Signed)
Physical Therapy Evaluation Patient Details Name: Jamie [name] MRN: [id number] DOB: February 09, 1923 Today's Date: 05/22/2016   History of Present Illness  Patient is a 83 yo female admitted 05/19/16 with chest pain.    PMH:  HTN, COPD, chronic resp failure on home O2, HLD, pacemaker, MI, CHF, ICM  Clinical Impression  Patient is functioning at Mod I level with mobility and gait.  No loss of balance with high level balance activities.  No acute PT needs identified - PT will sign off.  Ready for d/c from PT perspective.    Follow Up Recommendations No PT follow up;Supervision - Intermittent    Equipment Recommendations  None recommended by PT    Recommendations for Other Services       Precautions / Restrictions Precautions Precautions: None Precaution Comments: No history of falls Restrictions Weight Bearing Restrictions: No      Mobility  Bed Mobility               General bed mobility comments: Patient in recliner as PT entered room  Transfers Overall transfer level: Modified independent Equipment used: None             General transfer comment: Increased time  Ambulation/Gait Ambulation/Gait assistance: Modified independent (Device/Increase time) Ambulation Distance (Feet): 40 Feet Assistive device: None Gait Pattern/deviations: Step-through pattern;Decreased stride length   Gait velocity interpretation: at or above normal speed for age/gender General Gait Details: Patient with good gait pattern, balance, and speed.  No loss of balance during gait, or with head turns, direction changes.  Stairs            Wheelchair Mobility    Modified Rankin (Stroke Patients Only)       Balance Overall balance assessment: No apparent balance deficits (not formally assessed)                           High level balance activites: Direction changes;Turns;Sudden stops;Head turns High Level Balance Comments: No loss of balance with high level balance  activities             Pertinent Vitals/Pain Pain Assessment: No/denies pain    Home Living Family/patient expects to be discharged to:: Private residence Living Arrangements: Alone Available Help at Discharge: Family;Available PRN/intermittently (2 daughters assist patient) Type of Home: House Home Access: Stairs to enter Entrance Stairs-Rails: Psychiatric nurse of Steps: 3 Home Layout: Multi-level;Bed/bath upstairs Home Equipment: Walker - standard;Cane - single point      Prior Function Level of Independence: Independent with assistive device(s)         Comments: Patient uses cane outside on uneven surfaces.  Daughters provide transportation and assist with "heavy cleaning".     Hand Dominance        Extremity/Trunk Assessment   Upper Extremity Assessment Upper Extremity Assessment: Overall WFL for tasks assessed    Lower Extremity Assessment Lower Extremity Assessment: Overall WFL for tasks assessed       Communication   Communication: No difficulties  Cognition Arousal/Alertness: Awake/alert Behavior During Therapy: WFL for tasks assessed/performed Overall Cognitive Status: Within Functional Limits for tasks assessed                      General Comments      Exercises     Assessment/Plan    PT Assessment Patent does not need any further PT services  PT Problem List         PT  Treatment Interventions      PT Goals (Current goals can be found in the Care Plan section)  Acute Rehab PT Goals Patient Stated Goal: To go home today PT Goal Formulation: All assessment and education complete, DC therapy    Frequency     Barriers to discharge        Co-evaluation               End of Session Equipment Utilized During Treatment: Oxygen Activity Tolerance: Patient tolerated treatment well Patient left: in chair;with call bell/phone within reach;with chair alarm set Nurse Communication: Mobility status (No f/u  PT or equip needs) PT Visit Diagnosis: Other abnormalities of gait and mobility (R26.89)         Time: 4097-3532 PT Time Calculation (min) (ACUTE ONLY): 13 min   Charges:   PT Evaluation $PT Eval Low Complexity: 1 Procedure     PT G Codes:         Despina Pole 2016/06/11, 10:59 AM Carita Pian. Sanjuana Kava, [place] Pager [phone]

## 2016-05-25 NOTE — Progress Notes (Signed)
Cardiology Office Note Date:  05/27/2016  Patient ID:  Jamie, [name] [birth year], MRN [id number] PCP:  Jerlyn Ly, MD  Cardiologist:  Dr. Caryl Comes   Chief Complaint: hospital f/u  History of Present Illness: Jamie [name] is a 83 y.o. female with history of CHB w/PPM, O2 dependent COPD, CAD, chronic combined CHF, HTN, ICM, recently hospitalized with c/o CP, she was evaluated by cardiology service, initially treated with heparin gtt, patient declined cath given ongoing chest complaints, though did r/o with 3 neg troponins, elevated Ddimer was f/u with a negative CT chest for PE, noting severe COPD/emphysema, she developed fever and was treated for a pneumonia/bronchitis with Abx and a 5 day steroid regime, as well as protonix for GERD which reportedly seemed to improve her initial symptoms.  She comes in today accompanied by her daughter.  She reports feeling quite well.  Denies any kind of CP, no palpitations, she wears o2 chronically and describes minimal DOE/SOB.  No dizziness, near syncope or syncope.  She has been very well since her discharge from the hospital.  She lives in her own home, cares for herself, remains very active.  The patient and daughter have some concerns about her current dose of lasix.  She was found with a component of CHF at her hospital stay and her dose increased to the current alternating 20/40mg  regime.  However, in hindsight she was not taking her lasix at home daily as thought and at most was taking it only QOD for at least 3 weeks prior to her stay.   DEVICE information:  MDT dual chamber PPM, programmed VDD, gen change 2010, initially implanted 2002, Dr. Caryl Comes Jan 2018, sensitivity was adjusted and sensing assurance programmed off with a report of syncopal event to reduce possibility of oversensing (under pacing) V PACED AT 30BPM TODAY   Past Medical History:  Diagnosis Date  . Asthma   . Carotid stenosis    a. Carotid US (9/15):  Bilateral ICA 1-39%    . Chronic combined systolic and diastolic CHF (congestive heart failure) ([place])   . Complete AV block ([place])   . COPD (chronic obstructive pulmonary disease) ([place])   . Family history of anesthesia complication    SISTER HAS DIFFICULTY WAKING  . GERD (gastroesophageal reflux disease)   . Hypertension   . Ischemic cardiomyopathy    a.  Echo (9/15):  EF 40-45%, ant and apical and inferoapical HK, Gr 1 DD, trivial MR, mild TR, PASP 23 mmHg;  b. Myoview (9/15) large scar in mid LAD territory, no peri-infarct ischemia, EF 60%, apical AK; intermediate risk >> patient opted for med Rx  . Osteoarthritis   . Osteopenia   . Pacemaker   . Pneumonia   . Presence of permanent cardiac pacemaker   . Shortness of breath   . Tachycardia   . UTI (urinary tract infection)     Past Surgical History:  Procedure Laterality Date  . CHOLECYSTECTOMY    . EYE SURGERY  10/2004  . HERNIA REPAIR  11/2002  . PACEMAKER PLACEMENT     Medtronic    Current Outpatient Prescriptions  Medication Sig Dispense Refill  . albuterol (PROAIR HFA) 108 (90 BASE) MCG/ACT inhaler Inhale 2 puffs into the lungs every 6 (six) hours as needed for wheezing or shortness of breath.     . ALPRAZolam (XANAX) 0.5 MG tablet Take 0.5 mg by mouth at bedtime as needed for sleep.     Marland Kitchen aspirin EC 81 MG tablet  Take 81 mg by mouth daily.     . beta carotene w/minerals (OCUVITE) tablet Take 1 tablet by mouth daily.    . Calcium Carbonate (CALTRATE 600) 1500 MG TABS Take 600 mg by mouth daily.     . cholecalciferol (VITAMIN D) 1000 UNITS tablet Take 1,000 Units by mouth daily.      . Coenzyme Q10 (CO Q 10 PO) Take 1 capsule by mouth daily.    . cyanocobalamin 1000 MCG tablet Take 1,000 mcg by mouth daily.     . furosemide (LASIX) 20 MG tablet Take 1 tablet (20 mg total) by mouth every other day. 30 tablet 0  . furosemide (LASIX) 40 MG tablet Take 1 tablet (40 mg total) by mouth every other day. 30 tablet 0  . Glucosamine-Chondroitin (OSTEO  BI-FLEX REGULAR STRENGTH PO) Take 2 tablets by mouth daily.    . isosorbide mononitrate (IMDUR) 30 MG 24 hr tablet TAKE 1 TABLET BY MOUTH DAILY 90 tablet 2  . levalbuterol (XOPENEX) 0.63 MG/3ML nebulizer solution Take 0.63 mg by nebulization every 6 (six) hours as needed for shortness of breath.    . losartan (COZAAR) 25 MG tablet TAKE ONE TABLET BY MOUTH ONE TIME DAILY 30 tablet 11  . Magnesium 250 MG TABS Take 250 mg by mouth daily.     . metoprolol (LOPRESSOR) 50 MG tablet TAKE 1/2 A TAB BY MOUTH TWICE A DAY (Patient taking differently: TAKE 1/2 A TAB EVERY MORNING AND A SECOND 1/2 IN THE EVENING AS NEEDED FOR BLOOD PRESSURE) 30 tablet 6  . nitroGLYCERIN (NITROSTAT) 0.4 MG SL tablet Place 1 tablet (0.4 mg total) under the tongue every 5 (five) minutes as needed for chest pain. 15 tablet 12  . Omega-3 Fatty Acids (FISH OIL) 1200 MG CAPS Take 1,200 mg by mouth daily.     . pantoprazole (PROTONIX) 40 MG tablet Take 1 tablet (40 mg total) by mouth daily. 30 tablet 1  . Polyethyl Glycol-Propyl Glycol (SYSTANE) 0.4-0.3 % SOLN Place 1 drop into both eyes 2 (two) times daily as needed (dry eyes).    . potassium chloride (K-DUR) 10 MEQ tablet Take 10 mEq by mouth 2 (two) times a week. Tuesday and Friday     No current facility-administered medications for this visit.     Allergies:   Ace inhibitors and Hydrocodone-acetaminophen   Social History:  The patient  reports that she quit smoking about 29 years ago. Her smoking use included Cigarettes. She has a 35.00 pack-year smoking history. She has never used smokeless tobacco. She reports that she does not drink alcohol or use drugs.   Family History:  The patient's family history includes Breast cancer in her sister; Heart disease in her sister.  ROS:  Please see the history of present illness.  All other systems are reviewed and otherwise negative.   PHYSICAL EXAM:  VS:  BP 110/64   Pulse 63   Ht 5\' 3"  (1.6 m)   Wt 111 lb (50.3 kg)   BMI 19.66  kg/m  BMI: Body mass index is 19.66 kg/m. Well nourished, thin, though well developed, in no acute distress  HEENT: normocephalic, atraumatic  Neck: no JVD, carotid bruits or masses Cardiac:  RRR; no significant murmurs, no rubs, or gallops Lungs:  CTA b/l, no wheezing, rhonchi or rales  Abd: soft, nontender MS: no deformity or atrophy Ext: no edema  Skin: warm and dry, no rash Neuro:  No gross deficits appreciated Psych: euthymic mood, full affect  PPM site is stable, no tethering or discomfort   EKG:  Done 05/20/16 was SR with V paced rhythm Device interrogation done today and reviewed by myself: battery and lead measurements stable, VP at 30 today.  05/20/16: TTE Study Conclusions - Left ventricle: The cavity size was normal. Wall thickness was   normal. Systolic function was normal. The estimated ejection   fraction was in the range of 55% to 60%. Wall motion was normal;   there were no regional wall motion abnormalities. Doppler   parameters are consistent with abnormal left ventricular   relaxation (grade 1 diastolic dysfunction). - Aortic valve: There was trivial regurgitation. - Mitral valve: Calcified annulus. Mildly thickened leaflets . - Atrial septum: There was an atrial septal aneurysm. Impressions: - Normal LV systolic function; grade 1 diastolic dysfunction;   calcified aortic valve with trace AI; mild TR.  Recent Labs: 05/19/2016: ALT 15; B Natriuretic Peptide 166.9 05/20/2016: Magnesium 2.0 05/21/2016: Hemoglobin 12.2; Platelets 198 05/22/2016: BUN 13; Creatinine, Ser 0.81; Potassium 4.2; Sodium 134  No results found for requested labs within last 8760 hours.   Estimated Creatinine Clearance: 35.2 mL/min (by C-G formula based on SCr of 0.81 mg/dL).   Wt Readings from Last 3 Encounters:  05/27/16 111 lb (50.3 kg)  05/19/16 110 lb 3.7 oz (50 kg)  03/04/16 115 lb 6.4 oz (52.3 kg)     Other studies reviewed: Additional studies/records reviewed today include:  summarized above  ASSESSMENT AND PLAN:  1. Recent c/o CP     remain resolved, suspect GERD +/- bronchitis/pneumonia  2. PPM     stable device function, no changes made  3. HTN     stable no changes  4. Chronic combined CHF     last echo with normal LVEF, grade I DD     Agree, since she was not taking her lasix daily, an up-titration would be to 20mg  daily (rather then 40mg  QOD)     Will change then her lasix to 20mg  daily, same K+     She sees her PMD next week for labs already and follows her blood work there.     She is counseled to monitor for weight gain, swelling or change in her baseline breathing and notify if she observes this  Disposition: F/u with her PMD as scheduled, 3 month remote pacer check, 6 months with dr. Caryl Comes sooner if needed.  Current medicines are reviewed at length with the patient today.  The patient did not have any concerns regarding medicines.  Haywood Lasso, PA-C 05/27/2016 8:45 AM     [facility] [address] [place] [address] [phone] (office)  [phone] (fax)

## 2016-05-27 ENCOUNTER — Ambulatory Visit (INDEPENDENT_AMBULATORY_CARE_PROVIDER_SITE_OTHER): Payer: Medicare Other | Admitting: Physician Assistant

## 2016-05-27 VITALS — BP 110/64 | HR 63 | Ht 63.0 in | Wt 111.0 lb

## 2016-05-27 DIAGNOSIS — Z950 Presence of cardiac pacemaker: Secondary | ICD-10-CM

## 2016-05-27 DIAGNOSIS — I442 Atrioventricular block, complete: Secondary | ICD-10-CM | POA: Diagnosis not present

## 2016-05-27 DIAGNOSIS — I200 Unstable angina: Secondary | ICD-10-CM

## 2016-05-27 DIAGNOSIS — I5032 Chronic diastolic (congestive) heart failure: Secondary | ICD-10-CM

## 2016-05-27 DIAGNOSIS — I110 Hypertensive heart disease with heart failure: Secondary | ICD-10-CM

## 2016-05-27 NOTE — Patient Instructions (Addendum)
Medication Instructions:   STOP TAKING LASIX 40 MG   START TAKING LASIX 20 MG ONCE A DAY   If you need a refill on your cardiac medications before your next appointment, please call your pharmacy.  Labwork: NONE ORDERED  TODAY    Testing/Procedures: NONE ORDERED  TODAY '   Follow-Up:  Your physician wants you to follow-up in:  [place] will receive a reminder letter in the mail two months in advance. If you don't receive a letter, please call our office to schedule the follow-up appointment.  Remote monitoring is used to monitor your Pacemaker of ICD from home. This monitoring reduces the number of office visits required to check your device to one time per year. It allows [place] to keep an eye on the functioning of your device to ensure it is working properly. You are scheduled for a device check from home on.Marland Kitchen 08-30-2016..You may send your transmission at any time that day. If you have a wireless device, the transmission will be sent automatically. After your physician reviews your transmission, you will receive a postcard with your next transmission date.     Any Other Special Instructions Will Be Listed Below (If Applicable).

## 2016-06-02 DIAGNOSIS — H353211 Exudative age-related macular degeneration, right eye, with active choroidal neovascularization: Secondary | ICD-10-CM | POA: Diagnosis not present

## 2016-06-03 ENCOUNTER — Telehealth: Payer: Self-pay | Admitting: Cardiology

## 2016-06-03 ENCOUNTER — Encounter: Payer: Medicare Other | Admitting: *Deleted

## 2016-06-03 NOTE — Telephone Encounter (Signed)
Attempted to call pt and remind her to check her PPM from home today. Home number was busy, mobile number was not available at this time.

## 2016-06-04 ENCOUNTER — Encounter: Payer: Self-pay | Admitting: Cardiology

## 2016-06-20 DIAGNOSIS — Z950 Presence of cardiac pacemaker: Secondary | ICD-10-CM | POA: Diagnosis not present

## 2016-06-20 DIAGNOSIS — Z681 Body mass index (BMI) 19 or less, adult: Secondary | ICD-10-CM | POA: Diagnosis not present

## 2016-06-20 DIAGNOSIS — I10 Essential (primary) hypertension: Secondary | ICD-10-CM | POA: Diagnosis not present

## 2016-06-20 DIAGNOSIS — J449 Chronic obstructive pulmonary disease, unspecified: Secondary | ICD-10-CM | POA: Diagnosis not present

## 2016-06-20 DIAGNOSIS — K219 Gastro-esophageal reflux disease without esophagitis: Secondary | ICD-10-CM | POA: Diagnosis not present

## 2016-06-20 DIAGNOSIS — I5189 Other ill-defined heart diseases: Secondary | ICD-10-CM | POA: Diagnosis not present

## 2016-07-12 DIAGNOSIS — H353221 Exudative age-related macular degeneration, left eye, with active choroidal neovascularization: Secondary | ICD-10-CM | POA: Diagnosis not present

## 2016-07-26 DIAGNOSIS — H353211 Exudative age-related macular degeneration, right eye, with active choroidal neovascularization: Secondary | ICD-10-CM | POA: Diagnosis not present

## 2016-07-26 DIAGNOSIS — H353221 Exudative age-related macular degeneration, left eye, with active choroidal neovascularization: Secondary | ICD-10-CM | POA: Diagnosis not present

## 2016-07-26 DIAGNOSIS — H35351 Cystoid macular degeneration, right eye: Secondary | ICD-10-CM | POA: Diagnosis not present

## 2016-07-26 DIAGNOSIS — H353131 Nonexudative age-related macular degeneration, bilateral, early dry stage: Secondary | ICD-10-CM | POA: Diagnosis not present

## 2016-07-26 DIAGNOSIS — H35051 Retinal neovascularization, unspecified, right eye: Secondary | ICD-10-CM | POA: Diagnosis not present

## 2016-07-26 DIAGNOSIS — H3561 Retinal hemorrhage, right eye: Secondary | ICD-10-CM | POA: Diagnosis not present

## 2016-08-26 ENCOUNTER — Ambulatory Visit (INDEPENDENT_AMBULATORY_CARE_PROVIDER_SITE_OTHER): Payer: Medicare Other | Admitting: *Deleted

## 2016-08-26 DIAGNOSIS — I442 Atrioventricular block, complete: Secondary | ICD-10-CM | POA: Diagnosis not present

## 2016-08-27 ENCOUNTER — Encounter: Payer: Self-pay | Admitting: Cardiology

## 2016-08-27 NOTE — Progress Notes (Signed)
Remote pacemaker transmission.   

## 2016-08-31 LAB — CUP PACEART REMOTE DEVICE CHECK
Battery Impedance: 2020 Ohm
Battery Remaining Longevity: 30 mo
Battery Voltage: 2.75 V
Brady Statistic AP VS Percent: 0 %
Brady Statistic AS VP Percent: 100 %
Date Time Interrogation Session: 20180614145522
Implantable Lead Implant Date: 20020116
Implantable Lead Location: 753859
Implantable Lead Location: 753860
Implantable Lead Model: 5076
Implantable Pulse Generator Implant Date: 20101108
Lead Channel Impedance Value: 67 Ohm
Lead Channel Pacing Threshold Pulse Width: 0.4 ms
Lead Channel Setting Pacing Amplitude: 2.5 V
Lead Channel Setting Sensing Sensitivity: 11.2 mV
MDC IDC LEAD IMPLANT DT: 20020116
MDC IDC MSMT LEADCHNL RV IMPEDANCE VALUE: 484 Ohm
MDC IDC MSMT LEADCHNL RV PACING THRESHOLD AMPLITUDE: 0.875 V
MDC IDC SET LEADCHNL RV PACING PULSEWIDTH: 0.4 ms
MDC IDC STAT BRADY AP VP PERCENT: 0 %
MDC IDC STAT BRADY AS VS PERCENT: 0 %

## 2016-09-03 DIAGNOSIS — I5189 Other ill-defined heart diseases: Secondary | ICD-10-CM | POA: Diagnosis not present

## 2016-09-03 DIAGNOSIS — I2510 Atherosclerotic heart disease of native coronary artery without angina pectoris: Secondary | ICD-10-CM | POA: Diagnosis not present

## 2016-09-03 DIAGNOSIS — I10 Essential (primary) hypertension: Secondary | ICD-10-CM | POA: Diagnosis not present

## 2016-09-03 DIAGNOSIS — K219 Gastro-esophageal reflux disease without esophagitis: Secondary | ICD-10-CM | POA: Diagnosis not present

## 2016-09-03 DIAGNOSIS — R7301 Impaired fasting glucose: Secondary | ICD-10-CM | POA: Diagnosis not present

## 2016-09-03 DIAGNOSIS — Z681 Body mass index (BMI) 19 or less, adult: Secondary | ICD-10-CM | POA: Diagnosis not present

## 2016-09-03 DIAGNOSIS — Z950 Presence of cardiac pacemaker: Secondary | ICD-10-CM | POA: Diagnosis not present

## 2016-09-03 DIAGNOSIS — J449 Chronic obstructive pulmonary disease, unspecified: Secondary | ICD-10-CM | POA: Diagnosis not present

## 2016-09-10 ENCOUNTER — Encounter: Payer: Self-pay | Admitting: Cardiology

## 2016-09-14 DIAGNOSIS — H353122 Nonexudative age-related macular degeneration, left eye, intermediate dry stage: Secondary | ICD-10-CM | POA: Diagnosis not present

## 2016-09-14 DIAGNOSIS — H353211 Exudative age-related macular degeneration, right eye, with active choroidal neovascularization: Secondary | ICD-10-CM | POA: Diagnosis not present

## 2016-09-14 DIAGNOSIS — H353222 Exudative age-related macular degeneration, left eye, with inactive choroidal neovascularization: Secondary | ICD-10-CM | POA: Diagnosis not present

## 2016-09-14 DIAGNOSIS — H353112 Nonexudative age-related macular degeneration, right eye, intermediate dry stage: Secondary | ICD-10-CM | POA: Diagnosis not present

## 2016-10-05 DIAGNOSIS — H353222 Exudative age-related macular degeneration, left eye, with inactive choroidal neovascularization: Secondary | ICD-10-CM | POA: Diagnosis not present

## 2016-10-05 DIAGNOSIS — H353122 Nonexudative age-related macular degeneration, left eye, intermediate dry stage: Secondary | ICD-10-CM | POA: Diagnosis not present

## 2016-10-05 DIAGNOSIS — H353212 Exudative age-related macular degeneration, right eye, with inactive choroidal neovascularization: Secondary | ICD-10-CM | POA: Diagnosis not present

## 2016-10-05 DIAGNOSIS — H353112 Nonexudative age-related macular degeneration, right eye, intermediate dry stage: Secondary | ICD-10-CM | POA: Diagnosis not present

## 2016-11-22 DIAGNOSIS — H353122 Nonexudative age-related macular degeneration, left eye, intermediate dry stage: Secondary | ICD-10-CM | POA: Diagnosis not present

## 2016-11-22 DIAGNOSIS — H353222 Exudative age-related macular degeneration, left eye, with inactive choroidal neovascularization: Secondary | ICD-10-CM | POA: Diagnosis not present

## 2016-11-22 DIAGNOSIS — H353212 Exudative age-related macular degeneration, right eye, with inactive choroidal neovascularization: Secondary | ICD-10-CM | POA: Diagnosis not present

## 2016-11-22 DIAGNOSIS — H353112 Nonexudative age-related macular degeneration, right eye, intermediate dry stage: Secondary | ICD-10-CM | POA: Diagnosis not present

## 2016-11-25 ENCOUNTER — Telehealth: Payer: Self-pay | Admitting: Cardiology

## 2016-11-25 ENCOUNTER — Ambulatory Visit (INDEPENDENT_AMBULATORY_CARE_PROVIDER_SITE_OTHER): Payer: Medicare Other | Admitting: *Deleted

## 2016-11-25 DIAGNOSIS — I442 Atrioventricular block, complete: Secondary | ICD-10-CM

## 2016-11-25 NOTE — Telephone Encounter (Signed)
Spoke with pt and reminded pt of remote transmission that is due today. Pt verbalized understanding.   

## 2016-11-25 NOTE — Progress Notes (Signed)
Remote pacemaker check. 

## 2016-11-26 ENCOUNTER — Encounter: Payer: Self-pay | Admitting: Cardiology

## 2016-12-02 ENCOUNTER — Ambulatory Visit (INDEPENDENT_AMBULATORY_CARE_PROVIDER_SITE_OTHER): Payer: Medicare Other | Admitting: *Deleted

## 2016-12-02 DIAGNOSIS — I442 Atrioventricular block, complete: Secondary | ICD-10-CM

## 2016-12-03 ENCOUNTER — Encounter: Payer: Self-pay | Admitting: Cardiology

## 2016-12-03 NOTE — Progress Notes (Signed)
Remote pacemaker transmission.   

## 2016-12-03 NOTE — Progress Notes (Signed)
Letter  

## 2016-12-06 LAB — CUP PACEART REMOTE DEVICE CHECK
Battery Impedance: 2173 Ohm
Battery Remaining Longevity: 27 mo
Battery Voltage: 2.75 V
Brady Statistic AP VP Percent: 0 %
Brady Statistic AS VP Percent: 100 %
Implantable Lead Implant Date: 20020116
Implantable Lead Location: 753859
Implantable Lead Location: 753860
Implantable Pulse Generator Implant Date: 20101108
Lead Channel Pacing Threshold Pulse Width: 0.4 ms
Lead Channel Setting Pacing Amplitude: 2.5 V
Lead Channel Setting Pacing Pulse Width: 0.4 ms
Lead Channel Setting Sensing Sensitivity: 11.2 mV
MDC IDC LEAD IMPLANT DT: 20020116
MDC IDC MSMT LEADCHNL RA IMPEDANCE VALUE: 67 Ohm
MDC IDC MSMT LEADCHNL RV IMPEDANCE VALUE: 472 Ohm
MDC IDC MSMT LEADCHNL RV PACING THRESHOLD AMPLITUDE: 0.875 V
MDC IDC SESS DTM: 20180920230040
MDC IDC STAT BRADY AP VS PERCENT: 0 %
MDC IDC STAT BRADY AS VS PERCENT: 0 %

## 2016-12-28 DIAGNOSIS — H353222 Exudative age-related macular degeneration, left eye, with inactive choroidal neovascularization: Secondary | ICD-10-CM | POA: Diagnosis not present

## 2016-12-28 DIAGNOSIS — H353212 Exudative age-related macular degeneration, right eye, with inactive choroidal neovascularization: Secondary | ICD-10-CM | POA: Diagnosis not present

## 2016-12-28 DIAGNOSIS — H4301 Vitreous prolapse, right eye: Secondary | ICD-10-CM | POA: Diagnosis not present

## 2016-12-28 DIAGNOSIS — H353122 Nonexudative age-related macular degeneration, left eye, intermediate dry stage: Secondary | ICD-10-CM | POA: Diagnosis not present

## 2016-12-30 ENCOUNTER — Encounter: Payer: Medicare Other | Admitting: Internal Medicine

## 2016-12-31 ENCOUNTER — Encounter: Payer: Medicare Other | Admitting: Internal Medicine

## 2017-01-19 ENCOUNTER — Other Ambulatory Visit: Payer: Self-pay | Admitting: Internal Medicine

## 2017-01-24 DIAGNOSIS — M810 Age-related osteoporosis without current pathological fracture: Secondary | ICD-10-CM | POA: Diagnosis not present

## 2017-01-24 DIAGNOSIS — E7849 Other hyperlipidemia: Secondary | ICD-10-CM | POA: Diagnosis not present

## 2017-01-24 DIAGNOSIS — R82998 Other abnormal findings in urine: Secondary | ICD-10-CM | POA: Diagnosis not present

## 2017-01-24 DIAGNOSIS — Z23 Encounter for immunization: Secondary | ICD-10-CM | POA: Diagnosis not present

## 2017-01-24 DIAGNOSIS — R7301 Impaired fasting glucose: Secondary | ICD-10-CM | POA: Diagnosis not present

## 2017-01-24 DIAGNOSIS — I10 Essential (primary) hypertension: Secondary | ICD-10-CM | POA: Diagnosis not present

## 2017-01-31 DIAGNOSIS — Z950 Presence of cardiac pacemaker: Secondary | ICD-10-CM | POA: Diagnosis not present

## 2017-01-31 DIAGNOSIS — Z1231 Encounter for screening mammogram for malignant neoplasm of breast: Secondary | ICD-10-CM | POA: Diagnosis not present

## 2017-01-31 DIAGNOSIS — Z681 Body mass index (BMI) 19 or less, adult: Secondary | ICD-10-CM | POA: Diagnosis not present

## 2017-01-31 DIAGNOSIS — J449 Chronic obstructive pulmonary disease, unspecified: Secondary | ICD-10-CM | POA: Diagnosis not present

## 2017-01-31 DIAGNOSIS — Z0000 Encounter for general adult medical examination without abnormal findings: Secondary | ICD-10-CM | POA: Diagnosis not present

## 2017-01-31 DIAGNOSIS — R42 Dizziness and giddiness: Secondary | ICD-10-CM | POA: Diagnosis not present

## 2017-01-31 DIAGNOSIS — I2510 Atherosclerotic heart disease of native coronary artery without angina pectoris: Secondary | ICD-10-CM | POA: Diagnosis not present

## 2017-01-31 DIAGNOSIS — Z1389 Encounter for screening for other disorder: Secondary | ICD-10-CM | POA: Diagnosis not present

## 2017-01-31 DIAGNOSIS — R61 Generalized hyperhidrosis: Secondary | ICD-10-CM | POA: Diagnosis not present

## 2017-01-31 DIAGNOSIS — M810 Age-related osteoporosis without current pathological fracture: Secondary | ICD-10-CM | POA: Diagnosis not present

## 2017-01-31 DIAGNOSIS — H6122 Impacted cerumen, left ear: Secondary | ICD-10-CM | POA: Diagnosis not present

## 2017-01-31 DIAGNOSIS — I5189 Other ill-defined heart diseases: Secondary | ICD-10-CM | POA: Diagnosis not present

## 2017-02-08 DIAGNOSIS — Z1212 Encounter for screening for malignant neoplasm of rectum: Secondary | ICD-10-CM | POA: Diagnosis not present

## 2017-02-25 ENCOUNTER — Encounter: Payer: Self-pay | Admitting: Internal Medicine

## 2017-03-01 DIAGNOSIS — H353212 Exudative age-related macular degeneration, right eye, with inactive choroidal neovascularization: Secondary | ICD-10-CM | POA: Diagnosis not present

## 2017-03-01 DIAGNOSIS — H43392 Other vitreous opacities, left eye: Secondary | ICD-10-CM | POA: Diagnosis not present

## 2017-03-01 DIAGNOSIS — H4301 Vitreous prolapse, right eye: Secondary | ICD-10-CM | POA: Diagnosis not present

## 2017-03-01 DIAGNOSIS — H353222 Exudative age-related macular degeneration, left eye, with inactive choroidal neovascularization: Secondary | ICD-10-CM | POA: Diagnosis not present

## 2017-03-02 ENCOUNTER — Ambulatory Visit (INDEPENDENT_AMBULATORY_CARE_PROVIDER_SITE_OTHER): Payer: Medicare Other | Admitting: Internal Medicine

## 2017-03-02 ENCOUNTER — Encounter: Payer: Self-pay | Admitting: Internal Medicine

## 2017-03-02 VITALS — BP 186/85 | HR 81 | Ht 63.0 in | Wt 111.0 lb

## 2017-03-02 DIAGNOSIS — I442 Atrioventricular block, complete: Secondary | ICD-10-CM

## 2017-03-02 DIAGNOSIS — I200 Unstable angina: Secondary | ICD-10-CM

## 2017-03-02 DIAGNOSIS — Z950 Presence of cardiac pacemaker: Secondary | ICD-10-CM | POA: Diagnosis not present

## 2017-03-02 LAB — CUP PACEART INCLINIC DEVICE CHECK
Battery Impedance: 2233 Ohm
Battery Remaining Longevity: 26 mo
Battery Voltage: 2.74 V
Brady Statistic AP VP Percent: 0 %
Brady Statistic AS VP Percent: 100 %
Date Time Interrogation Session: 20181219174007
Implantable Lead Implant Date: 20020116
Implantable Lead Location: 753860
Implantable Lead Model: 5076
Implantable Pulse Generator Implant Date: 20101108
Lead Channel Impedance Value: 439 Ohm
Lead Channel Impedance Value: 67 Ohm
Lead Channel Sensing Intrinsic Amplitude: 0.7 mV
Lead Channel Setting Pacing Amplitude: 2.5 V
Lead Channel Setting Pacing Pulse Width: 0.4 ms
MDC IDC LEAD IMPLANT DT: 20020116
MDC IDC LEAD LOCATION: 753859
MDC IDC MSMT LEADCHNL RV PACING THRESHOLD AMPLITUDE: 1 V
MDC IDC MSMT LEADCHNL RV PACING THRESHOLD AMPLITUDE: 1 V
MDC IDC MSMT LEADCHNL RV PACING THRESHOLD PULSEWIDTH: 0.4 ms
MDC IDC MSMT LEADCHNL RV PACING THRESHOLD PULSEWIDTH: 0.4 ms
MDC IDC SET LEADCHNL RV SENSING SENSITIVITY: 11.2 mV
MDC IDC STAT BRADY AP VS PERCENT: 0 %
MDC IDC STAT BRADY AS VS PERCENT: 0 %

## 2017-03-02 NOTE — Patient Instructions (Signed)
Medication Instructions: Your physician recommends that you continue on your current medications as directed. Please refer to the Current Medication list given to you today.  Labwork: None Ordered  Procedures/Testing: None Ordered  Follow-Up: Your physician wants you to follow-up in: 1 YEAR with Dr. Caryl Comes. You will receive a reminder letter in the mail two months in advance. If you don't receive a letter, please call our office to schedule the follow-up appointment.  Remote monitoring is used to monitor your Pacemaker  from home. This monitoring reduces the number of office visits required to check your device to one time per year. It allows [place] to keep an eye on the functioning of your device to ensure it is working properly. You are scheduled for a device check from home on 06/01/17. You may send your transmission at any time that day. If you have a wireless device, the transmission will be sent automatically. After your physician reviews your transmission, you will receive a postcard with your next transmission date.  If you need a refill on your cardiac medications before your next appointment, please call your pharmacy.   -- Please obtain an abdominal binder and bed blocks to raise your bed --

## 2017-03-02 NOTE — Progress Notes (Signed)
[facility]      Patient Care Team: Crist Infante, MD as PCP - General (Internal Medicine)   HPI  Jamie [name] is a 83 y.o. female  seen in followup for complete heart block for which she is status post pacemaker implantation which is programmed in the VDD mode. She underwent device generator replacement fall 2010  She also has CAD with complete LAD infarct  and now has oxygen dependent COP    Catheterization>>> no obstructive coronary disease. (August 2012)  Echocardiogram September 2015 demonstrated interval decrease in LV function 60--40% with significant wall motion abnormalities this is in comparison to May 2014. Troponins were abnormal with 0.46. It was felt that she had a completed LAD infarction and this was confirmed by Myoview scanning  She has recurrent episodes of presyncope that are orthostatic in nature.  She has had no interval syncope.     Past Medical History:  Diagnosis Date  . Asthma   . Carotid stenosis    a. Carotid US (9/15):  Bilateral ICA 1-39%  . Chronic combined systolic and diastolic CHF (congestive heart failure) ([place])   . Complete AV block ([place])   . COPD (chronic obstructive pulmonary disease) ([place])   . Family history of anesthesia complication    SISTER HAS DIFFICULTY WAKING  . GERD (gastroesophageal reflux disease)   . Hypertension   . Ischemic cardiomyopathy    a.  Echo (9/15):  EF 40-45%, ant and apical and inferoapical HK, Gr 1 DD, trivial MR, mild TR, PASP 23 mmHg;  b. Myoview (9/15) large scar in mid LAD territory, no peri-infarct ischemia, EF 60%, apical AK; intermediate risk >> patient opted for med Rx  . Osteoarthritis   . Osteopenia   . Pacemaker   . Pneumonia   . Presence of permanent cardiac pacemaker   . Shortness of breath   . Tachycardia   . UTI (urinary tract infection)     Past Surgical History:  Procedure Laterality Date  . CHOLECYSTECTOMY    . EYE SURGERY  10/2004  . HERNIA REPAIR  11/2002  . PACEMAKER PLACEMENT     Medtronic    Current Outpatient Medications  Medication Sig Dispense Refill  . albuterol (PROAIR HFA) 108 (90 BASE) MCG/ACT inhaler Inhale 2 puffs into the lungs every 6 (six) hours as needed for wheezing or shortness of breath.     . ALPRAZolam (XANAX) 0.5 MG tablet Take 0.5 mg by mouth at bedtime as needed for sleep.     Marland Kitchen aspirin EC 81 MG tablet Take 81 mg by mouth daily.     . beta carotene w/minerals (OCUVITE) tablet Take 1 tablet by mouth daily.    . Calcium Carbonate (CALTRATE 600) 1500 MG TABS Take 600 mg by mouth daily.     . cholecalciferol (VITAMIN D) 1000 UNITS tablet Take 1,000 Units by mouth daily.      . Coenzyme Q10 (CO Q 10 PO) Take 1 capsule by mouth daily.    . cyanocobalamin 1000 MCG tablet Take 1,000 mcg by mouth daily.     . furosemide (LASIX) 20 MG tablet Take 1 tablet (20 mg total) by mouth every other day. (Patient taking differently: Take 20 mg by mouth daily. ) 30 tablet 0  . Glucosamine-Chondroitin (OSTEO BI-FLEX REGULAR STRENGTH PO) Take 2 tablets by mouth daily.    . isosorbide mononitrate (IMDUR) 30 MG 24 hr tablet TAKE 1 TABLET BY MOUTH DAILY 90 tablet 0  . levalbuterol (XOPENEX) 0.63 MG/3ML nebulizer  solution Take 0.63 mg by nebulization every 6 (six) hours as needed for shortness of breath.    . losartan (COZAAR) 25 MG tablet TAKE ONE TABLET BY MOUTH ONE TIME DAILY 30 tablet 11  . Magnesium 250 MG TABS Take 250 mg by mouth daily.     . metoprolol (LOPRESSOR) 50 MG tablet TAKE 1/2 A TAB BY MOUTH TWICE A DAY (Patient taking differently: TAKE 1/2 A TAB EVERY MORNING AND A SECOND 1/2 IN THE EVENING AS NEEDED FOR BLOOD PRESSURE) 30 tablet 6  . nitroGLYCERIN (NITROSTAT) 0.4 MG SL tablet Place 1 tablet (0.4 mg total) under the tongue every 5 (five) minutes as needed for chest pain. 15 tablet 12  . Omega-3 Fatty Acids (FISH OIL) 1200 MG CAPS Take 1,200 mg by mouth daily.     Vladimir Faster Glycol-Propyl Glycol (SYSTANE) 0.4-0.3 % SOLN Place 1 drop into both eyes 2 (two)  times daily as needed (dry eyes).    . potassium chloride (K-DUR) 10 MEQ tablet Take 10 mEq by mouth 2 (two) times a week. Tuesday and Friday    . Probiotic Product (PHILLIPS COLON HEALTH PO) Take 1 capsule by mouth daily.     No current facility-administered medications for this visit.     Allergies  Allergen Reactions  . Ace Inhibitors Cough  . Hydrocodone-Acetaminophen Other (See Comments)     GI distress    Review of Systems negative except from HPI and PMH  Physical Exam BP (!) 186/85   Pulse 81   Ht 5\' 3"  (1.6 m)   Wt 111 lb (50.3 kg)   BMI 19.66 kg/m  Well developed and nourished in no acute distress wearing O2 HENT normal Neck supple with JVP-flat Clear Regular rate and rhythm, no murmurs or gallops Abd-soft with active BS No Clubbing cyanosis edema Skin-warm and dry A & Oriented  Grossly normal sensory and motor function y  ECG demonstrates sinus rhythm with P   synchronous pacing at rate  64 Assessment and  Plan  Coronary artery disease with prior MI  Complete heart block  Pacemaker-Medtronic  Statin intolerance  Syncope -orthostatic  Hypertension   Long discussion regarding the mechanism of orthostatic hyptension and the assoc with resting systolic hypertension  For now will work on mitigating the orthostasis and then we can attend to the systolic hypertension as preventing falls is the more immediate issue  She is advised to take BP lying and standing and get 4 " blocks for the head of the bed   Without symptoms of ischemia  More than 50% of 45 min was spent in counseling related to the above   Overall [name] has done very well. She is euvolemic. There is no symptoms of ischemia. We will continue her current medications.  The episodes of syncope are concerning. There brevity suggests an arrhythmia as opposed to a vasomotor phenomenon. With a pacemaker in place, the explanation would have to be over sensing and pacing inhibition. There  could also be tachycardia below her detection rates that is triggering a vasomotor episode, however, this is less likely given the brevity of the episodes.  No evidence of myocardial potential inhibition could be visualized; we decrease ventricular sensitivity to 10 mV.  She was also noted to have PMT in the context of only intermittent retrograde conduction. PVARP was extended   In the event that she has recurrent syncope, I would consider implantation of a loop recorder  More than 50% of 45 min was spent  in counseling related to the above

## 2017-03-03 ENCOUNTER — Encounter: Payer: Medicare Other | Admitting: *Deleted

## 2017-03-21 DIAGNOSIS — H353122 Nonexudative age-related macular degeneration, left eye, intermediate dry stage: Secondary | ICD-10-CM | POA: Diagnosis not present

## 2017-03-21 DIAGNOSIS — H353212 Exudative age-related macular degeneration, right eye, with inactive choroidal neovascularization: Secondary | ICD-10-CM | POA: Diagnosis not present

## 2017-03-21 DIAGNOSIS — H353222 Exudative age-related macular degeneration, left eye, with inactive choroidal neovascularization: Secondary | ICD-10-CM | POA: Diagnosis not present

## 2017-03-21 DIAGNOSIS — H4301 Vitreous prolapse, right eye: Secondary | ICD-10-CM | POA: Diagnosis not present

## 2017-03-27 ENCOUNTER — Other Ambulatory Visit: Payer: Self-pay | Admitting: Physician Assistant

## 2017-03-28 ENCOUNTER — Emergency Department (HOSPITAL_COMMUNITY): Payer: Medicare Other

## 2017-03-28 ENCOUNTER — Emergency Department (HOSPITAL_COMMUNITY)
Admission: EM | Admit: 2017-03-28 | Discharge: 2017-03-28 | Disposition: A | Discharge status: Home / Self Care | Payer: Medicare Other | Attending: Emergency Medicine | Admitting: Emergency Medicine

## 2017-03-28 ENCOUNTER — Other Ambulatory Visit: Payer: Self-pay

## 2017-03-28 ENCOUNTER — Encounter (HOSPITAL_COMMUNITY): Payer: Self-pay | Admitting: Emergency Medicine

## 2017-03-28 DIAGNOSIS — Z5321 Procedure and treatment not carried out due to patient leaving prior to being seen by health care provider: Secondary | ICD-10-CM | POA: Insufficient documentation

## 2017-03-28 DIAGNOSIS — R252 Cramp and spasm: Secondary | ICD-10-CM | POA: Insufficient documentation

## 2017-03-28 DIAGNOSIS — M79605 Pain in left leg: Secondary | ICD-10-CM | POA: Diagnosis not present

## 2017-03-28 DIAGNOSIS — R079 Chest pain, unspecified: Secondary | ICD-10-CM | POA: Diagnosis not present

## 2017-03-28 DIAGNOSIS — R531 Weakness: Secondary | ICD-10-CM | POA: Diagnosis not present

## 2017-03-28 LAB — CBC WITH DIFFERENTIAL/PLATELET
BASOS ABS: 0 10*3/uL (ref 0.0–0.1)
BASOS PCT: 0 %
Eosinophils Absolute: 0.2 10*3/uL (ref 0.0–0.7)
Eosinophils Relative: 3 %
HEMATOCRIT: 41.6 % (ref 36.0–46.0)
Hemoglobin: 13.6 g/dL (ref 12.0–15.0)
LYMPHS PCT: 19 %
Lymphs Abs: 1.3 10*3/uL (ref 0.7–4.0)
MCH: 32.1 pg (ref 26.0–34.0)
MCHC: 32.7 g/dL (ref 30.0–36.0)
MCV: 98.1 fL (ref 78.0–100.0)
MONO ABS: 0.6 10*3/uL (ref 0.1–1.0)
Monocytes Relative: 9 %
NEUTROS ABS: 4.7 10*3/uL (ref 1.7–7.7)
NEUTROS PCT: 69 %
Platelets: 239 10*3/uL (ref 150–400)
RBC: 4.24 MIL/uL (ref 3.87–5.11)
RDW: 13 % (ref 11.5–15.5)
WBC: 6.8 10*3/uL (ref 4.0–10.5)

## 2017-03-28 LAB — BASIC METABOLIC PANEL
ANION GAP: 11 (ref 5–15)
BUN: 18 mg/dL (ref 6–20)
CHLORIDE: 96 mmol/L — AB (ref 101–111)
CO2: 30 mmol/L (ref 22–32)
Calcium: 9.1 mg/dL (ref 8.9–10.3)
Creatinine, Ser: 0.78 mg/dL (ref 0.44–1.00)
GFR calc Af Amer: >60 mL/min (ref >60)
GLUCOSE: 91 mg/dL (ref 65–99)
POTASSIUM: 4 mmol/L (ref 3.5–5.1)
SODIUM: 137 mmol/L (ref 135–145)

## 2017-03-28 LAB — TROPONIN I

## 2017-03-28 NOTE — ED Triage Notes (Addendum)
Brought in by ems.  Legs cramping around 730 am. Pt hx of acid reflux. Daughter came to check on her and gave her 3 ntg due to her c/o chest burning. Pt states helped some.  Has also been without xanax x 2 days A/o. Lives alone. Denies leg pain now.

## 2017-04-18 ENCOUNTER — Other Ambulatory Visit: Payer: Self-pay | Admitting: Internal Medicine

## 2017-05-24 DIAGNOSIS — H4301 Vitreous prolapse, right eye: Secondary | ICD-10-CM | POA: Diagnosis not present

## 2017-05-24 DIAGNOSIS — H353222 Exudative age-related macular degeneration, left eye, with inactive choroidal neovascularization: Secondary | ICD-10-CM | POA: Diagnosis not present

## 2017-05-24 DIAGNOSIS — H35051 Retinal neovascularization, unspecified, right eye: Secondary | ICD-10-CM | POA: Diagnosis not present

## 2017-05-24 DIAGNOSIS — H353211 Exudative age-related macular degeneration, right eye, with active choroidal neovascularization: Secondary | ICD-10-CM | POA: Diagnosis not present

## 2017-05-24 DIAGNOSIS — H353122 Nonexudative age-related macular degeneration, left eye, intermediate dry stage: Secondary | ICD-10-CM | POA: Diagnosis not present

## 2017-06-01 ENCOUNTER — Ambulatory Visit (INDEPENDENT_AMBULATORY_CARE_PROVIDER_SITE_OTHER): Payer: Medicare Other | Admitting: *Deleted

## 2017-06-01 DIAGNOSIS — I442 Atrioventricular block, complete: Secondary | ICD-10-CM | POA: Diagnosis not present

## 2017-06-02 ENCOUNTER — Encounter: Payer: Self-pay | Admitting: Cardiology

## 2017-06-02 DIAGNOSIS — J449 Chronic obstructive pulmonary disease, unspecified: Secondary | ICD-10-CM | POA: Diagnosis not present

## 2017-06-02 DIAGNOSIS — I5189 Other ill-defined heart diseases: Secondary | ICD-10-CM | POA: Diagnosis not present

## 2017-06-02 DIAGNOSIS — R7301 Impaired fasting glucose: Secondary | ICD-10-CM | POA: Diagnosis not present

## 2017-06-02 DIAGNOSIS — I2510 Atherosclerotic heart disease of native coronary artery without angina pectoris: Secondary | ICD-10-CM | POA: Diagnosis not present

## 2017-06-02 DIAGNOSIS — Z950 Presence of cardiac pacemaker: Secondary | ICD-10-CM | POA: Diagnosis not present

## 2017-06-02 DIAGNOSIS — Z681 Body mass index (BMI) 19 or less, adult: Secondary | ICD-10-CM | POA: Diagnosis not present

## 2017-06-02 NOTE — Progress Notes (Signed)
Remote pacemaker transmission.   

## 2017-06-24 LAB — CUP PACEART REMOTE DEVICE CHECK
Battery Remaining Longevity: 24 mo
Battery Voltage: 2.74 V
Brady Statistic AP VP Percent: 0 %
Brady Statistic AS VP Percent: 100 %
Date Time Interrogation Session: 20190320133354
Implantable Lead Implant Date: 20020116
Implantable Lead Implant Date: 20020116
Implantable Lead Location: 753859
Implantable Lead Location: 753860
Implantable Pulse Generator Implant Date: 20101108
Lead Channel Pacing Threshold Amplitude: 0.75 V
Lead Channel Pacing Threshold Pulse Width: 0.4 ms
Lead Channel Setting Pacing Amplitude: 2.5 V
Lead Channel Setting Pacing Pulse Width: 0.4 ms
MDC IDC MSMT BATTERY IMPEDANCE: 2513 Ohm
MDC IDC MSMT LEADCHNL RA IMPEDANCE VALUE: 67 Ohm
MDC IDC MSMT LEADCHNL RV IMPEDANCE VALUE: 512 Ohm
MDC IDC SET LEADCHNL RV SENSING SENSITIVITY: 11.2 mV
MDC IDC STAT BRADY AP VS PERCENT: 0 %
MDC IDC STAT BRADY AS VS PERCENT: 0 %

## 2017-06-28 DIAGNOSIS — H43811 Vitreous degeneration, right eye: Secondary | ICD-10-CM | POA: Diagnosis not present

## 2017-06-28 DIAGNOSIS — H3561 Retinal hemorrhage, right eye: Secondary | ICD-10-CM | POA: Diagnosis not present

## 2017-06-28 DIAGNOSIS — H353211 Exudative age-related macular degeneration, right eye, with active choroidal neovascularization: Secondary | ICD-10-CM | POA: Diagnosis not present

## 2017-06-28 DIAGNOSIS — H4301 Vitreous prolapse, right eye: Secondary | ICD-10-CM | POA: Diagnosis not present

## 2017-08-02 DIAGNOSIS — H353211 Exudative age-related macular degeneration, right eye, with active choroidal neovascularization: Secondary | ICD-10-CM | POA: Diagnosis not present

## 2017-08-02 DIAGNOSIS — H4301 Vitreous prolapse, right eye: Secondary | ICD-10-CM | POA: Diagnosis not present

## 2017-08-02 DIAGNOSIS — H353122 Nonexudative age-related macular degeneration, left eye, intermediate dry stage: Secondary | ICD-10-CM | POA: Diagnosis not present

## 2017-08-02 DIAGNOSIS — H353222 Exudative age-related macular degeneration, left eye, with inactive choroidal neovascularization: Secondary | ICD-10-CM | POA: Diagnosis not present

## 2017-08-31 ENCOUNTER — Ambulatory Visit (INDEPENDENT_AMBULATORY_CARE_PROVIDER_SITE_OTHER): Payer: Medicare Other | Admitting: *Deleted

## 2017-08-31 DIAGNOSIS — I442 Atrioventricular block, complete: Secondary | ICD-10-CM

## 2017-08-31 NOTE — Progress Notes (Signed)
Remote pacemaker transmission.   

## 2017-09-02 LAB — CUP PACEART REMOTE DEVICE CHECK
Battery Impedance: 2582 Ohm
Brady Statistic AP VP Percent: 0 %
Brady Statistic AP VS Percent: 0 %
Brady Statistic AS VP Percent: 100 %
Brady Statistic AS VS Percent: 0 %
Implantable Lead Model: 5076
Lead Channel Impedance Value: 480 Ohm
Lead Channel Impedance Value: 67 Ohm
Lead Channel Setting Pacing Pulse Width: 0.4 ms
Lead Channel Setting Sensing Sensitivity: 11.2 mV
MDC IDC LEAD IMPLANT DT: 20020116
MDC IDC LEAD IMPLANT DT: 20020116
MDC IDC LEAD LOCATION: 753859
MDC IDC LEAD LOCATION: 753860
MDC IDC MSMT BATTERY REMAINING LONGEVITY: 23 mo
MDC IDC MSMT BATTERY VOLTAGE: 2.74 V
MDC IDC MSMT LEADCHNL RV PACING THRESHOLD AMPLITUDE: 1 V
MDC IDC MSMT LEADCHNL RV PACING THRESHOLD PULSEWIDTH: 0.4 ms
MDC IDC PG IMPLANT DT: 20101108
MDC IDC SESS DTM: 20190619133857
MDC IDC SET LEADCHNL RV PACING AMPLITUDE: 2.5 V

## 2017-09-27 DIAGNOSIS — H353211 Exudative age-related macular degeneration, right eye, with active choroidal neovascularization: Secondary | ICD-10-CM | POA: Diagnosis not present

## 2017-09-27 DIAGNOSIS — H353112 Nonexudative age-related macular degeneration, right eye, intermediate dry stage: Secondary | ICD-10-CM | POA: Diagnosis not present

## 2017-09-27 DIAGNOSIS — H353122 Nonexudative age-related macular degeneration, left eye, intermediate dry stage: Secondary | ICD-10-CM | POA: Diagnosis not present

## 2017-10-04 IMAGING — CR DG CHEST 2V
2 series · 2 of 2 positions shown · non-contrast
Comparison: 08/04/2014

CLINICAL DATA: Midchest pain and dyspnea tonight

EXAM:
CHEST  2 VIEW

[chest lat]
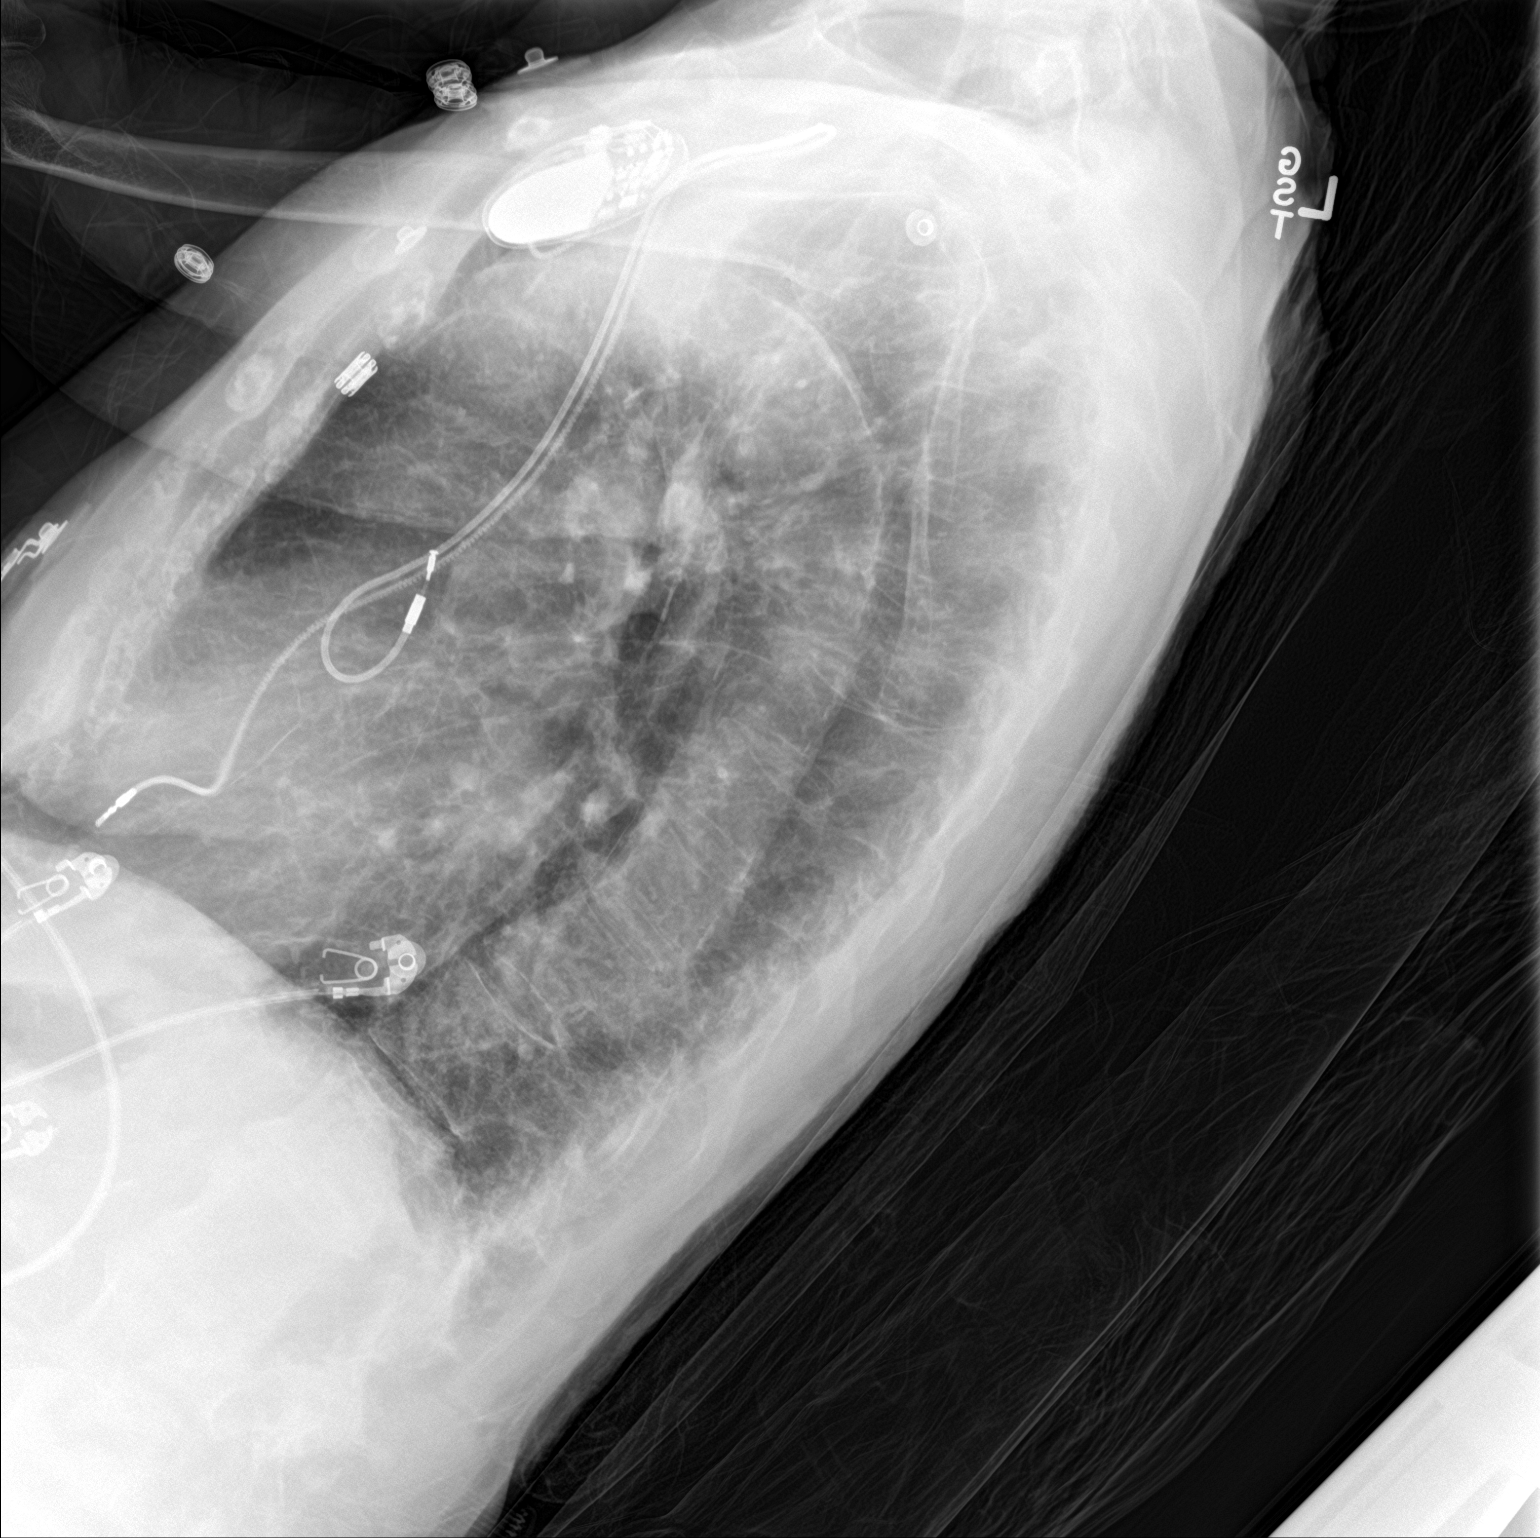

[chest ap]
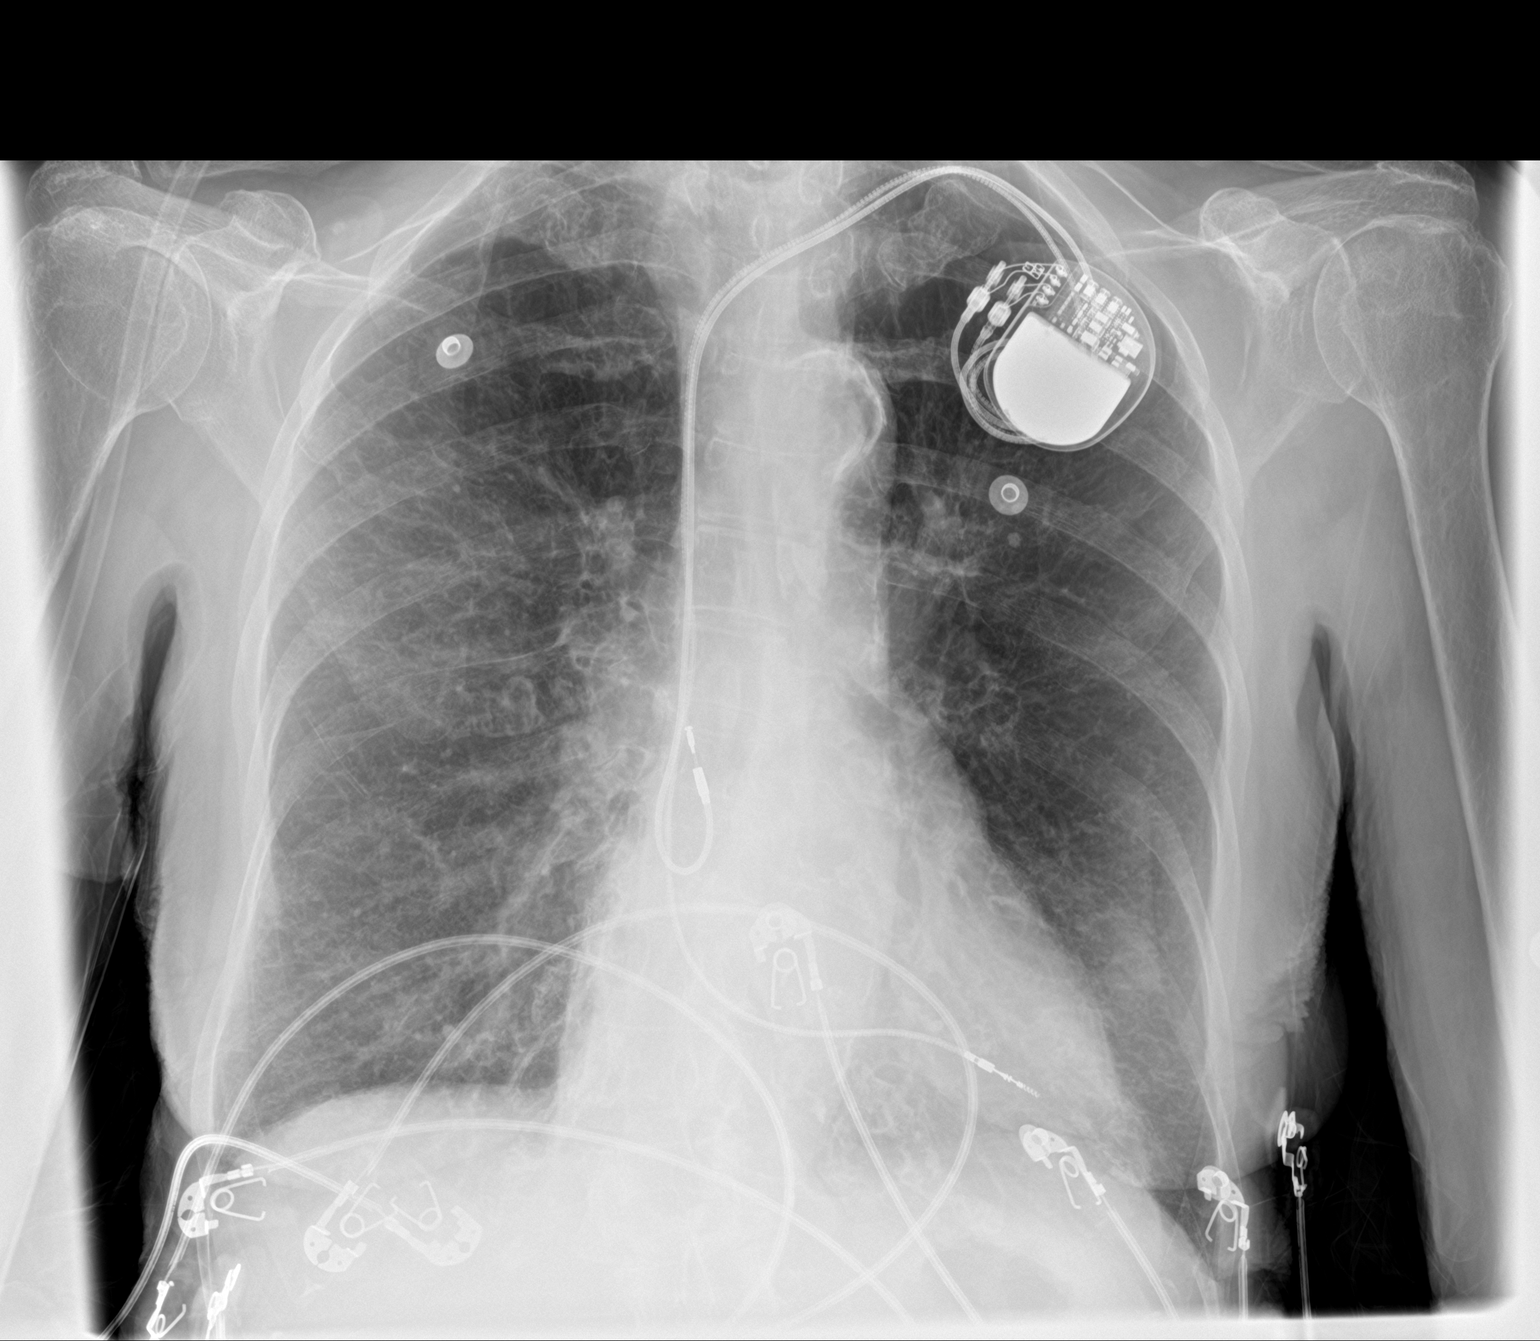

[2 of 2 positions shown; findings below may reference images not displayed]

FINDINGS: Stable hyperinflation. Generalized interstitial coarsening appears
chronic. No airspace consolidation. No effusions. Normal pulmonary
vasculature. Hilar, mediastinal and cardiac contours are
unremarkable and unchanged. The transvenous cardiac leads appear
grossly intact.
IMPRESSION: Stable hyperinflation and chronic interstitial coarsening. No
consolidation, effusion or evidence of CHF.

## 2017-10-05 IMAGING — CT CT ANGIO CHEST
3 of 7 series · 18 of 36 positions shown · IV contrast (Omni 300)
Comparison: Chest two-view 05/19/2016

CLINICAL DATA: Short of breath

EXAM:
CT ANGIOGRAPHY CHEST WITH CONTRAST
TECHNIQUE: Multidetector CT imaging of the chest was performed using the
standard protocol during bolus administration of intravenous
contrast. Multiplanar CT image reconstructions and MIPs were
obtained to evaluate the vascular anatomy.
CONTRAST:  100 mL Isovue 370 IV

[Series 6: pe lung · axial · 0.58mm/px · z∈[+1308,+1516]mm · 5 of 157 slices shown]
[im 27/157  mediastinal]
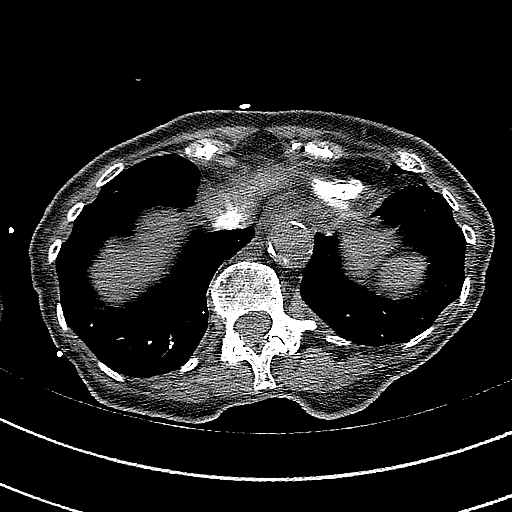
[im 53/157  mediastinal]
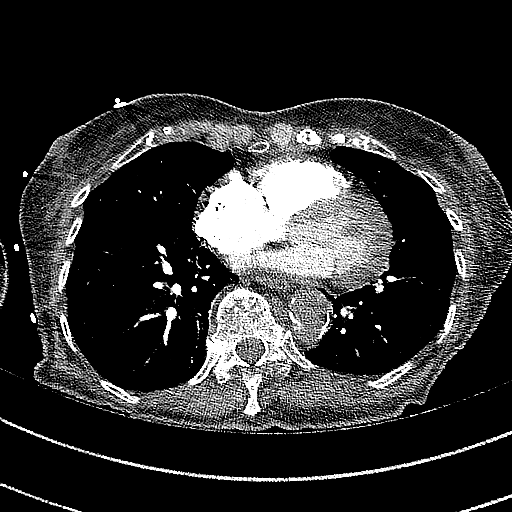
[im 79/157  mediastinal]
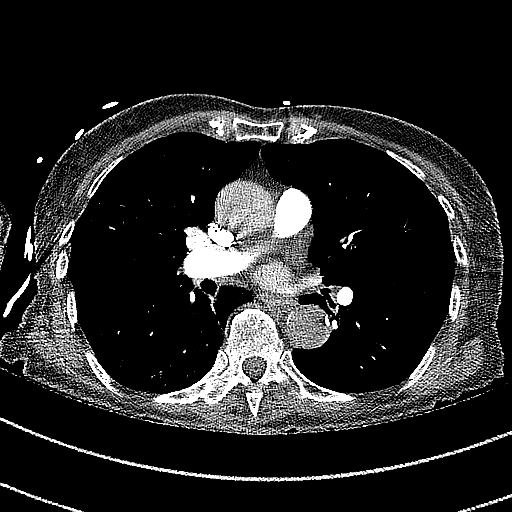
[im 105/157  mediastinal]
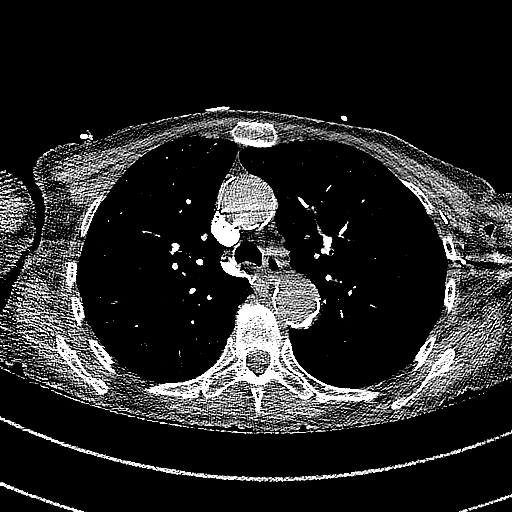
[im 131/157  mediastinal]
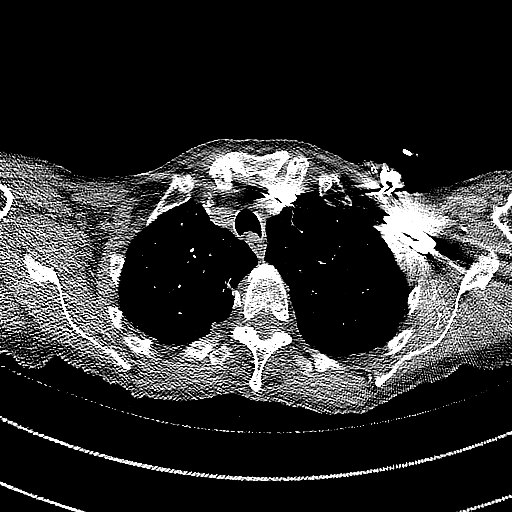

[Series 7: pe thins · axial · 0.58mm/px · z∈[+1272,+1543]mm · 12 of 321 slices shown]
[im 25/321  lung]
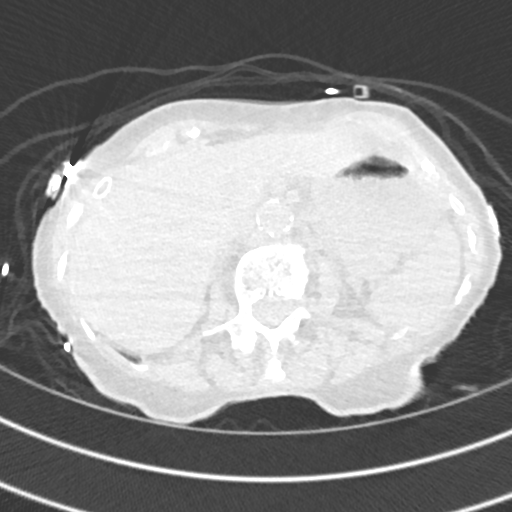
[im 50/321  mediastinal]
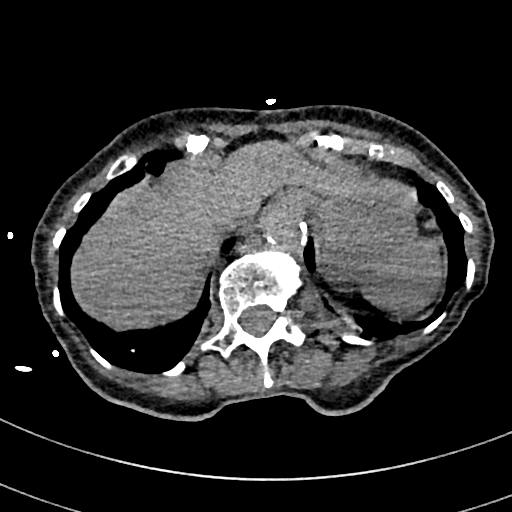
[im 74/321  lung]
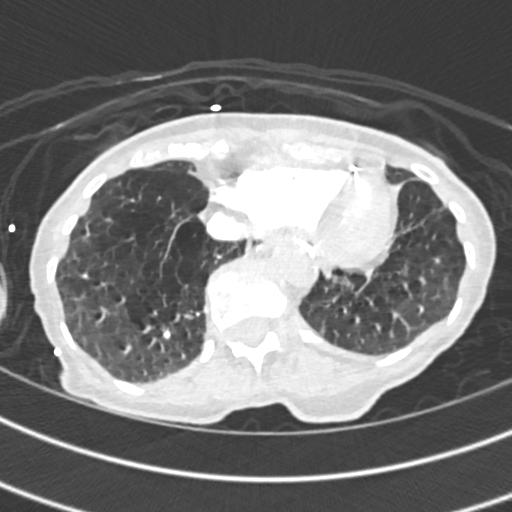
[im 99/321  mediastinal]
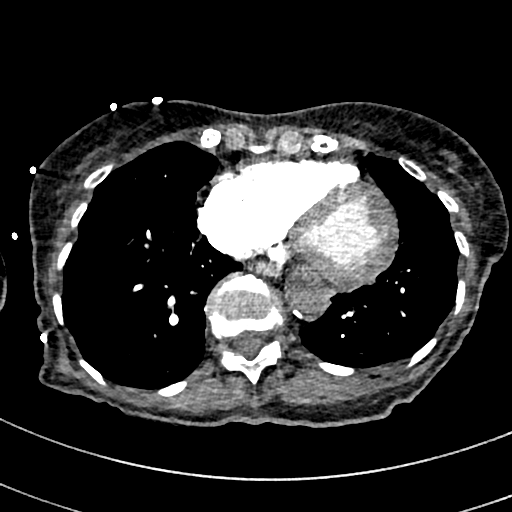
[im 124/321  lung]
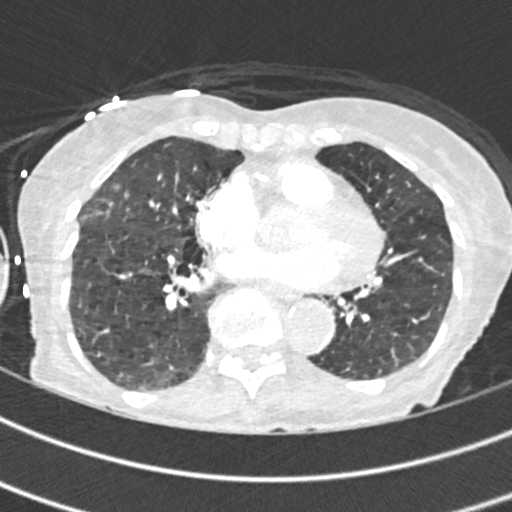
[im 148/321  mediastinal]
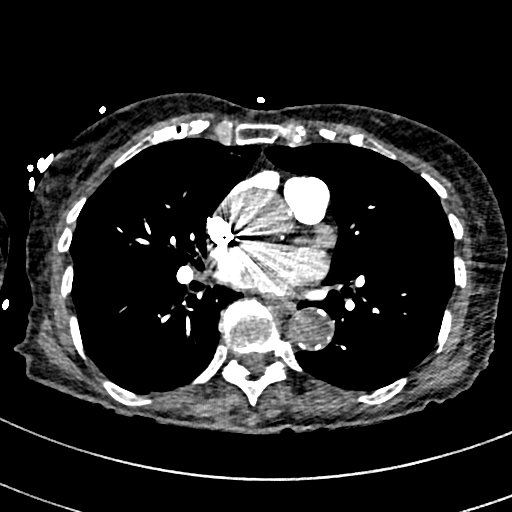
[im 173/321  lung]
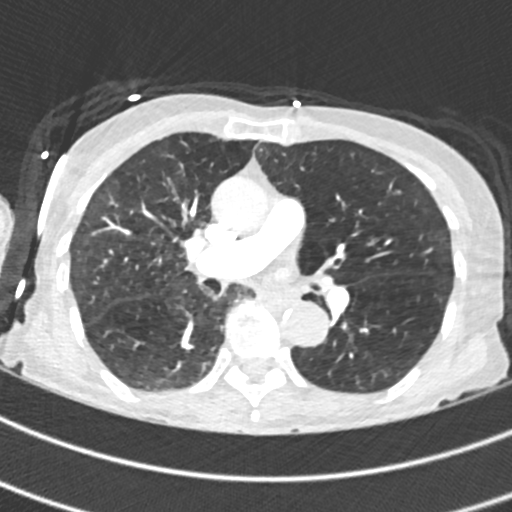
[im 197/321  mediastinal]
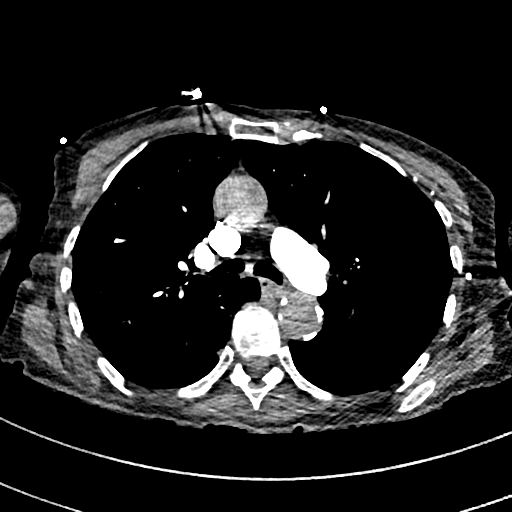
[im 222/321  lung]
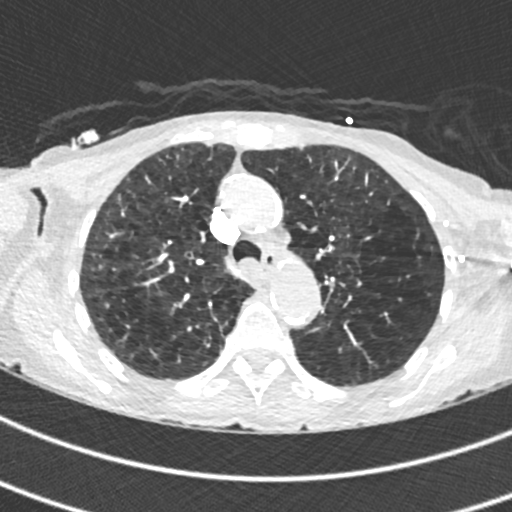
[im 247/321  mediastinal]
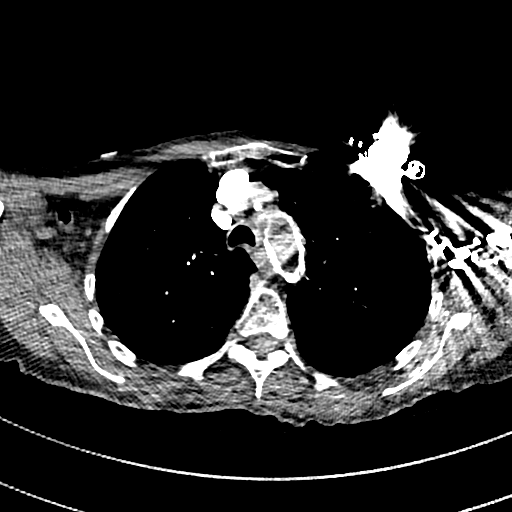
[im 271/321  lung]
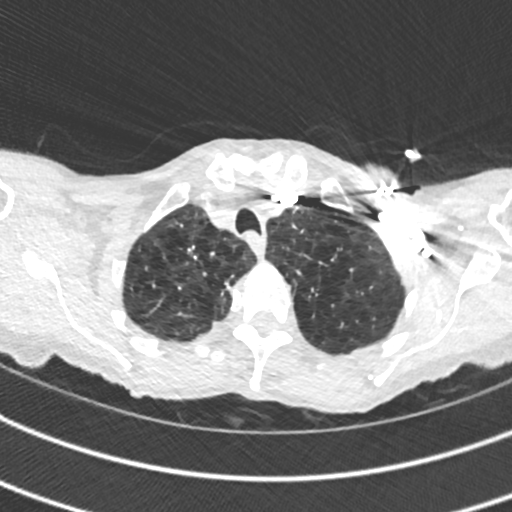
[im 296/321  mediastinal]
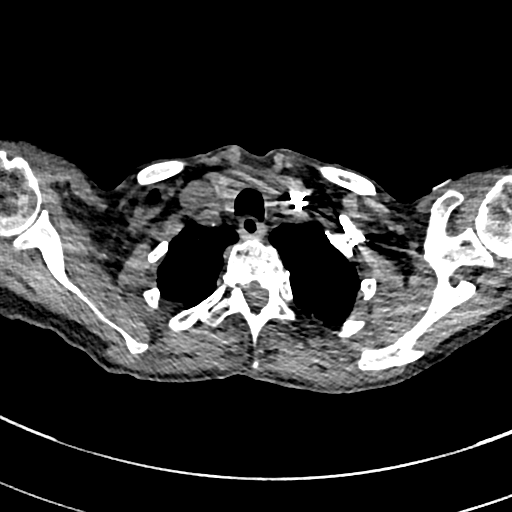

[Series 8: pe 2mm cor · coronal · 0.56mm/px · 1 of 108 slices shown]
[im 54/108  mediastinal]
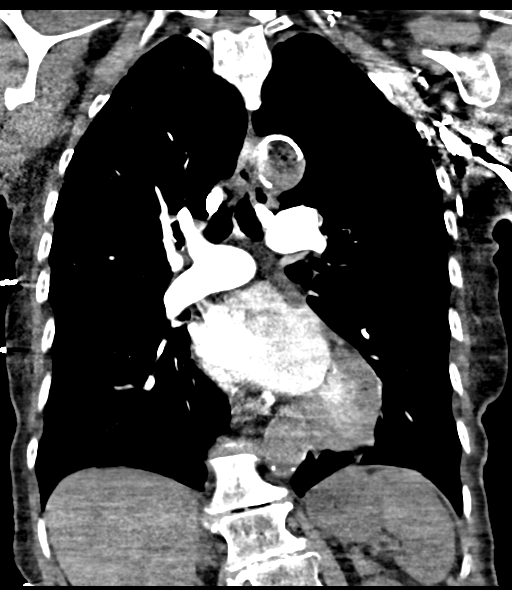

[18 of 36 positions shown; findings below may reference images not displayed]

FINDINGS: Cardiovascular: Negative for pulmonary embolism. Mild pulmonary
artery enlargement likely due to pulmonary artery hypertension
secondary to COPD. Aorta not adequately opacified for thorough
evaluation. Aorta is diffusely atherosclerotic. Ascending aorta
measures 33 mm in diameter and descending aorta measures 25 mm in
diameter. Dual lead pacemaker with leads in the right atrium and
right ventricle.

Mediastinum/Nodes: Calcified aortopulmonary window lymph node
compatible with granulomatous disease. Calcified granulomata in the
left upper lobe posteriorly. No malignant adenopathy or mass in the
mediastinum.

Lungs/Pleura: Severe COPD with emphysema. 4 mm and 6 mm calcified
granulomata of left upper lobe posteriorly. Negative for mass
lesion. Negative for pneumonia or effusion.

Upper Abdomen: 3 cm left upper lobe lesion, possibly a renal cyst
but incompletely evaluated. Atherosclerotic aorta.

Musculoskeletal: No acute skeletal abnormality.

Review of the MIP images confirms the above findings.
IMPRESSION: Negative for pulmonary embolism

Severe COPD with emphysema. Lungs are clear without evidence of
infiltrate or effusion

Atherosclerotic calcification in the aorta and coronary arteries.

## 2017-10-07 DIAGNOSIS — I2510 Atherosclerotic heart disease of native coronary artery without angina pectoris: Secondary | ICD-10-CM | POA: Diagnosis not present

## 2017-10-07 DIAGNOSIS — Z950 Presence of cardiac pacemaker: Secondary | ICD-10-CM | POA: Diagnosis not present

## 2017-10-07 DIAGNOSIS — I10 Essential (primary) hypertension: Secondary | ICD-10-CM | POA: Diagnosis not present

## 2017-10-07 DIAGNOSIS — I5189 Other ill-defined heart diseases: Secondary | ICD-10-CM | POA: Diagnosis not present

## 2017-10-07 DIAGNOSIS — J449 Chronic obstructive pulmonary disease, unspecified: Secondary | ICD-10-CM | POA: Diagnosis not present

## 2017-10-07 DIAGNOSIS — Z681 Body mass index (BMI) 19 or less, adult: Secondary | ICD-10-CM | POA: Diagnosis not present

## 2017-11-22 DIAGNOSIS — H353122 Nonexudative age-related macular degeneration, left eye, intermediate dry stage: Secondary | ICD-10-CM | POA: Diagnosis not present

## 2017-11-22 DIAGNOSIS — H353211 Exudative age-related macular degeneration, right eye, with active choroidal neovascularization: Secondary | ICD-10-CM | POA: Diagnosis not present

## 2017-11-22 DIAGNOSIS — H353222 Exudative age-related macular degeneration, left eye, with inactive choroidal neovascularization: Secondary | ICD-10-CM | POA: Diagnosis not present

## 2017-11-22 DIAGNOSIS — H4301 Vitreous prolapse, right eye: Secondary | ICD-10-CM | POA: Diagnosis not present

## 2017-11-30 ENCOUNTER — Ambulatory Visit (INDEPENDENT_AMBULATORY_CARE_PROVIDER_SITE_OTHER): Payer: Medicare Other | Admitting: *Deleted

## 2017-11-30 DIAGNOSIS — I442 Atrioventricular block, complete: Secondary | ICD-10-CM

## 2017-12-01 NOTE — Progress Notes (Signed)
Remote pacemaker transmission.   

## 2017-12-06 DIAGNOSIS — Z23 Encounter for immunization: Secondary | ICD-10-CM | POA: Diagnosis not present

## 2017-12-23 LAB — CUP PACEART REMOTE DEVICE CHECK
Brady Statistic AP VS Percent: 0 %
Brady Statistic AS VP Percent: 100 %
Brady Statistic AS VS Percent: 0 %
Implantable Lead Implant Date: 20020116
Implantable Lead Implant Date: 20020116
Lead Channel Impedance Value: 467 Ohm
Lead Channel Impedance Value: 67 Ohm
Lead Channel Pacing Threshold Amplitude: 1 V
Lead Channel Pacing Threshold Pulse Width: 0.4 ms
Lead Channel Setting Pacing Pulse Width: 0.4 ms
MDC IDC LEAD LOCATION: 753859
MDC IDC LEAD LOCATION: 753860
MDC IDC MSMT BATTERY IMPEDANCE: 2742 Ohm
MDC IDC MSMT BATTERY REMAINING LONGEVITY: 22 mo
MDC IDC MSMT BATTERY VOLTAGE: 2.73 V
MDC IDC PG IMPLANT DT: 20101108
MDC IDC SESS DTM: 20190918133619
MDC IDC SET LEADCHNL RV PACING AMPLITUDE: 2.5 V
MDC IDC SET LEADCHNL RV SENSING SENSITIVITY: 11.2 mV
MDC IDC STAT BRADY AP VP PERCENT: 0 %

## 2018-01-03 ENCOUNTER — Other Ambulatory Visit: Payer: Self-pay | Admitting: Internal Medicine

## 2018-01-08 ENCOUNTER — Encounter (HOSPITAL_COMMUNITY): Payer: Self-pay | Admitting: Emergency Medicine

## 2018-01-08 ENCOUNTER — Other Ambulatory Visit: Payer: Self-pay

## 2018-01-08 ENCOUNTER — Observation Stay (HOSPITAL_COMMUNITY)
Admission: EM | Admit: 2018-01-08 | Discharge: 2018-01-09 | Disposition: A | Discharge status: Home / Self Care | Payer: Medicare Other | Attending: Internal Medicine | Admitting: Internal Medicine

## 2018-01-08 ENCOUNTER — Emergency Department (HOSPITAL_COMMUNITY): Payer: Medicare Other

## 2018-01-08 DIAGNOSIS — Z87891 Personal history of nicotine dependence: Secondary | ICD-10-CM | POA: Diagnosis not present

## 2018-01-08 DIAGNOSIS — M8580 Other specified disorders of bone density and structure, unspecified site: Secondary | ICD-10-CM | POA: Diagnosis not present

## 2018-01-08 DIAGNOSIS — I442 Atrioventricular block, complete: Secondary | ICD-10-CM | POA: Diagnosis not present

## 2018-01-08 DIAGNOSIS — Z79899 Other long term (current) drug therapy: Secondary | ICD-10-CM | POA: Insufficient documentation

## 2018-01-08 DIAGNOSIS — Z885 Allergy status to narcotic agent status: Secondary | ICD-10-CM | POA: Diagnosis not present

## 2018-01-08 DIAGNOSIS — I110 Hypertensive heart disease with heart failure: Secondary | ICD-10-CM | POA: Insufficient documentation

## 2018-01-08 DIAGNOSIS — E785 Hyperlipidemia, unspecified: Secondary | ICD-10-CM | POA: Diagnosis not present

## 2018-01-08 DIAGNOSIS — R0602 Shortness of breath: Secondary | ICD-10-CM | POA: Diagnosis not present

## 2018-01-08 DIAGNOSIS — Z950 Presence of cardiac pacemaker: Secondary | ICD-10-CM | POA: Insufficient documentation

## 2018-01-08 DIAGNOSIS — Z7982 Long term (current) use of aspirin: Secondary | ICD-10-CM | POA: Diagnosis not present

## 2018-01-08 DIAGNOSIS — I5042 Chronic combined systolic (congestive) and diastolic (congestive) heart failure: Secondary | ICD-10-CM | POA: Insufficient documentation

## 2018-01-08 DIAGNOSIS — R0789 Other chest pain: Secondary | ICD-10-CM | POA: Diagnosis not present

## 2018-01-08 DIAGNOSIS — I10 Essential (primary) hypertension: Secondary | ICD-10-CM | POA: Diagnosis not present

## 2018-01-08 DIAGNOSIS — R079 Chest pain, unspecified: Secondary | ICD-10-CM

## 2018-01-08 DIAGNOSIS — K219 Gastro-esophageal reflux disease without esophagitis: Secondary | ICD-10-CM | POA: Diagnosis not present

## 2018-01-08 DIAGNOSIS — R110 Nausea: Secondary | ICD-10-CM | POA: Diagnosis not present

## 2018-01-08 DIAGNOSIS — I2510 Atherosclerotic heart disease of native coronary artery without angina pectoris: Secondary | ICD-10-CM | POA: Diagnosis present

## 2018-01-08 DIAGNOSIS — J439 Emphysema, unspecified: Secondary | ICD-10-CM | POA: Diagnosis present

## 2018-01-08 DIAGNOSIS — J449 Chronic obstructive pulmonary disease, unspecified: Secondary | ICD-10-CM | POA: Insufficient documentation

## 2018-01-08 DIAGNOSIS — R062 Wheezing: Secondary | ICD-10-CM | POA: Diagnosis not present

## 2018-01-08 HISTORY — DX: Syncope and collapse: R55

## 2018-01-08 HISTORY — DX: Transient cerebral ischemic attack, unspecified: G45.9

## 2018-01-08 HISTORY — DX: Orthostatic hypotension: I95.1

## 2018-01-08 LAB — BASIC METABOLIC PANEL
ANION GAP: 8 (ref 5–15)
BUN: 9 mg/dL (ref 8–23)
CO2: 31 mmol/L (ref 22–32)
Calcium: 9.3 mg/dL (ref 8.9–10.3)
Chloride: 97 mmol/L — ABNORMAL LOW (ref 98–111)
Creatinine, Ser: 0.83 mg/dL (ref 0.44–1.00)
GFR calc Af Amer: >60 mL/min (ref >60)
GFR calc non Af Amer: 59 mL/min — ABNORMAL LOW (ref >60)
GLUCOSE: 123 mg/dL — AB (ref 70–99)
POTASSIUM: 3.7 mmol/L (ref 3.5–5.1)
Sodium: 136 mmol/L (ref 135–145)

## 2018-01-08 LAB — I-STAT TROPONIN, ED: Troponin i, poc: 0.01 ng/mL (ref 0.00–0.08)

## 2018-01-08 LAB — CBC
HCT: 42.9 % (ref 36.0–46.0)
HEMOGLOBIN: 13.8 g/dL (ref 12.0–15.0)
MCH: 31.7 pg (ref 26.0–34.0)
MCHC: 32.2 g/dL (ref 30.0–36.0)
MCV: 98.4 fL (ref 80.0–100.0)
Platelets: 233 10*3/uL (ref 150–400)
RBC: 4.36 MIL/uL (ref 3.87–5.11)
RDW: 12.8 % (ref 11.5–15.5)
WBC: 6.6 10*3/uL (ref 4.0–10.5)
nRBC: 0 % (ref 0.0–0.2)

## 2018-01-08 LAB — TROPONIN I: Troponin I: <0.03 ng/mL (ref <0.03)

## 2018-01-08 LAB — PHOSPHORUS: Phosphorus: 3.4 mg/dL (ref 2.5–4.6)

## 2018-01-08 LAB — MAGNESIUM: Magnesium: 2 mg/dL (ref 1.7–2.4)

## 2018-01-08 MED ORDER — HEPARIN SODIUM (PORCINE) 5000 UNIT/ML IJ SOLN: 5000.0000 [IU] | Freq: Three times a day (TID) | INTRAMUSCULAR | Status: DC

## 2018-01-08 MED ORDER — POLYETHYL GLYCOL-PROPYL GLYCOL 0.4-0.3 % OP SOLN: 1.0000 [drp] | Freq: Two times a day (BID) | OPHTHALMIC | Status: DC | PRN

## 2018-01-08 MED ORDER — LOSARTAN POTASSIUM 25 MG PO TABS
25.0000 mg | ORAL_TABLET | Freq: Every day | ORAL | Status: DC
  Administered 2018-01-09: 25 mg via ORAL
  Filled 2018-01-08: qty 1

## 2018-01-08 MED ORDER — FUROSEMIDE 20 MG PO TABS
20.0000 mg | ORAL_TABLET | Freq: Every day | ORAL | Status: DC
  Administered 2018-01-09: 20 mg via ORAL
  Filled 2018-01-08: qty 1

## 2018-01-08 MED ORDER — NITROGLYCERIN 0.4 MG SL SUBL: 0.4000 mg | SUBLINGUAL_TABLET | SUBLINGUAL | Status: DC | PRN

## 2018-01-08 MED ORDER — ALPRAZOLAM 0.5 MG PO TABS
0.5000 mg | ORAL_TABLET | Freq: Every evening | ORAL | Status: DC | PRN
  Administered 2018-01-08: 0.5 mg via ORAL
  Filled 2018-01-08: qty 1

## 2018-01-08 MED ORDER — POTASSIUM CHLORIDE CRYS ER 20 MEQ PO TBCR
20.0000 meq | EXTENDED_RELEASE_TABLET | Freq: Once | ORAL | Status: AC
  Administered 2018-01-08: 20 meq via ORAL
  Filled 2018-01-08: qty 1

## 2018-01-08 MED ORDER — ACETAMINOPHEN 325 MG PO TABS: 650.0000 mg | ORAL_TABLET | ORAL | Status: DC | PRN

## 2018-01-08 MED ORDER — HEPARIN SODIUM (PORCINE) 5000 UNIT/ML IJ SOLN
5000.0000 [IU] | Freq: Three times a day (TID) | INTRAMUSCULAR | Status: DC
  Administered 2018-01-08 – 2018-01-09 (×3): 5000 [IU] via SUBCUTANEOUS
  Filled 2018-01-08 (×3): qty 1

## 2018-01-08 MED ORDER — ONDANSETRON HCL 4 MG/2ML IJ SOLN: 4.0000 mg | Freq: Four times a day (QID) | INTRAMUSCULAR | Status: DC | PRN

## 2018-01-08 MED ORDER — ISOSORBIDE MONONITRATE ER 30 MG PO TB24
30.0000 mg | ORAL_TABLET | Freq: Every day | ORAL | Status: DC
  Administered 2018-01-09: 30 mg via ORAL
  Filled 2018-01-08: qty 1

## 2018-01-08 MED ORDER — ALUM & MAG HYDROXIDE-SIMETH 200-200-20 MG/5ML PO SUSP
15.0000 mL | Freq: Once | ORAL | Status: AC
  Administered 2018-01-08: 15 mL via ORAL
  Filled 2018-01-08: qty 30

## 2018-01-08 MED ORDER — VITAMIN B-12 1000 MCG PO TABS
1000.0000 ug | ORAL_TABLET | Freq: Every day | ORAL | Status: DC
  Administered 2018-01-09: 1000 ug via ORAL
  Filled 2018-01-08: qty 1

## 2018-01-08 MED ORDER — LEVALBUTEROL HCL 0.63 MG/3ML IN NEBU
0.6300 mg | INHALATION_SOLUTION | Freq: Four times a day (QID) | RESPIRATORY_TRACT | Status: DC | PRN
  Administered 2018-01-08 – 2018-01-09 (×2): 0.63 mg via RESPIRATORY_TRACT
  Filled 2018-01-08 (×3): qty 3

## 2018-01-08 MED ORDER — VITAMIN D 1000 UNITS PO TABS
1000.0000 [IU] | ORAL_TABLET | Freq: Every day | ORAL | Status: DC
  Administered 2018-01-09: 1000 [IU] via ORAL
  Filled 2018-01-08: qty 1

## 2018-01-08 MED ORDER — ASPIRIN EC 81 MG PO TBEC
81.0000 mg | DELAYED_RELEASE_TABLET | Freq: Every day | ORAL | Status: DC
  Administered 2018-01-09: 81 mg via ORAL
  Filled 2018-01-08: qty 1

## 2018-01-08 MED ORDER — POTASSIUM CHLORIDE ER 10 MEQ PO TBCR: 10.0000 meq | EXTENDED_RELEASE_TABLET | ORAL | Status: DC

## 2018-01-08 MED ORDER — POLYVINYL ALCOHOL 1.4 % OP SOLN
1.0000 [drp] | Freq: Two times a day (BID) | OPHTHALMIC | Status: DC | PRN
  Filled 2018-01-08: qty 15

## 2018-01-08 MED ORDER — SODIUM CHLORIDE 0.9 % IV SOLN
INTRAVENOUS | Status: DC | PRN
  Administered 2018-01-08: 35 mL via INTRAVENOUS

## 2018-01-08 MED ORDER — FAMOTIDINE IN NACL 20-0.9 MG/50ML-% IV SOLN
20.0000 mg | Freq: Once | INTRAVENOUS | Status: AC
  Administered 2018-01-08: 20 mg via INTRAVENOUS
  Filled 2018-01-08: qty 50

## 2018-01-08 MED ORDER — PANTOPRAZOLE SODIUM 40 MG PO TBEC
40.0000 mg | DELAYED_RELEASE_TABLET | Freq: Every day | ORAL | Status: DC
  Administered 2018-01-09: 40 mg via ORAL
  Filled 2018-01-08: qty 1

## 2018-01-08 MED ORDER — CALCIUM CARBONATE 1250 (500 CA) MG PO TABS
1250.0000 mg | ORAL_TABLET | Freq: Every day | ORAL | Status: DC
  Administered 2018-01-09: 1250 mg via ORAL
  Filled 2018-01-08 (×2): qty 1

## 2018-01-08 NOTE — ED Provider Notes (Signed)
[facility] EMERGENCY DEPARTMENT Provider Note   CSN: [id number] Arrival date & time: 01/08/18  1050     History   Chief Complaint Chief Complaint  Patient presents with  . Shortness of Breath  . Excessive Sweating    HPI Jamie [name] is a 84 y.o. female.  84 yo F with a chief complaint of chest discomfort and shortness of breath and diaphoresis.  Going on for the past couple days.  She feels a burning sensation to the top portion of her chest.  Seems to come and go but is worse in the morning.  Gets diaphoretic and short of breath with it.  She denies exertional symptoms.  Does not seem to be worse with eating.  She is stopped taking Zantac because she was concerned about the warnings in the news.  She has been taking Protonix but without improvement.  The history is provided by the patient.  Shortness of Breath  This is a new problem. The average episode lasts 2 days. The problem occurs continuously.The current episode started 2 days ago. The problem has not changed since onset.Associated symptoms include chest pain. Pertinent negatives include no fever, no headaches, no rhinorrhea, no wheezing and no vomiting. She has tried nothing for the symptoms. The treatment provided no relief. She has had no prior hospitalizations. She has had no prior ED visits. Associated medical issues include COPD.    Past Medical History:  Diagnosis Date  . Asthma   . Carotid stenosis    a. Carotid US (9/15):  Bilateral ICA 1-39%  . Chronic combined systolic and diastolic CHF (congestive heart failure) ([place])   . Complete AV block ([place])   . COPD (chronic obstructive pulmonary disease) ([place])   . Family history of anesthesia complication    SISTER HAS DIFFICULTY WAKING  . GERD (gastroesophageal reflux disease)   . Hypertension   . Ischemic cardiomyopathy    a.  Echo (9/15):  EF 40-45%, ant and apical and inferoapical HK, Gr 1 DD, trivial MR, mild TR, PASP 23 mmHg;  b. Myoview  (9/15) large scar in mid LAD territory, no peri-infarct ischemia, EF 60%, apical AK; intermediate risk >> patient opted for med Rx  . Osteoarthritis   . Osteopenia   . Pacemaker   . Pneumonia   . Presence of permanent cardiac pacemaker   . Shortness of breath   . Tachycardia   . UTI (urinary tract infection)     Patient Active Problem List   Diagnosis Date Noted  . CAD (coronary artery disease) 01/08/2018  . Chest pain 05/19/2016  . Chronic respiratory failure with hypoxia, on home O2 therapy ([place]) 05/19/2016  . History of LAD infarction 05/19/2016  . Fever 05/19/2016  . Chronic combined systolic and diastolic heart failure, NYHA class 2 ([place]) 05/19/2016  . Ischemic cardiomyopathy 05/19/2016  . Dyslipidemia   . Blurred vision 08/05/2014  . Anginal pain ([place]) 01/22/2014  . Elevated troponin 11/26/2013  . Acute diastolic congestive heart failure ([place]) 07/25/2012  . Elevated brain natriuretic peptide (BNP) level 07/21/2012  . Orthostatic hypotension 08/16/2011  . Non-STEMI  possibly type II 11/17/2010  . VENTRICULAR TACHYCARDIA 09/29/2009  . HTN (hypertension) 06/06/2008  . AV BLOCK, COMPLETE 06/06/2008  . COPD (chronic obstructive pulmonary disease) with emphysema ([place]) 06/06/2008  . OSTEOARTHRITIS 06/06/2008  . OSTEOPENIA 06/06/2008  . TACHYCARDIA 06/06/2008  . Pacemaker-Medtronic 06/06/2008    Past Surgical History:  Procedure Laterality Date  . CHOLECYSTECTOMY    . EYE  SURGERY  10/2004  . HERNIA REPAIR  11/2002  . PACEMAKER PLACEMENT     Medtronic     OB History   None      Home Medications    Prior to Admission medications   Medication Sig Start Date End Date Taking? Authorizing Provider  albuterol (PROAIR HFA) 108 (90 BASE) MCG/ACT inhaler Inhale 2 puffs into the lungs every 6 (six) hours as needed for wheezing or shortness of breath.    Yes [provider]  ALPRAZolam Duanne Moron) 0.5 MG tablet Take 0.5 mg by mouth at bedtime as needed for sleep.    Yes  [provider]  aspirin EC 81 MG tablet Take 81 mg by mouth daily.    Yes [provider]  beta carotene w/minerals (OCUVITE) tablet Take 1 tablet by mouth daily.   Yes [provider]  Calcium Carbonate (CALTRATE 600) 1500 MG TABS Take 600 mg by mouth daily.    Yes [provider]  cholecalciferol (VITAMIN D) 1000 UNITS tablet Take 1,000 Units by mouth daily.     Yes [provider]  Coenzyme Q10 (CO Q 10 PO) Take 1 capsule by mouth daily.   Yes [provider]  cyanocobalamin 1000 MCG tablet Take 1,000 mcg by mouth daily.    Yes [provider]  furosemide (LASIX) 20 MG tablet Take 1 tablet (20 mg total) by mouth every other day. Patient taking differently: Take 20 mg by mouth daily.  05/23/16  Yes Amin, Jeanella Flattery, MD  isosorbide mononitrate (IMDUR) 30 MG 24 hr tablet Take 1 tablet (30 mg total) by mouth daily. Please keep upcoming appt in December with Dr. Caryl Comes for future refills. Thank you Patient taking differently: Take 30 mg by mouth daily.  01/04/18  Yes Deboraha Sprang, MD  levalbuterol Penne Lash) 0.63 MG/3ML nebulizer solution Take 0.63 mg by nebulization every 6 (six) hours as needed for shortness of breath. 07/25/12  Yes Rai, Ripudeep K, MD  losartan (COZAAR) 25 MG tablet TAKE ONE TABLET BY MOUTH ONE TIME DAILY Patient taking differently: Take 25 mg by mouth daily.  03/28/17  Yes Richardson Dopp T, PA-C  Magnesium 250 MG TABS Take 250 mg by mouth daily.    Yes [provider]  nitroGLYCERIN (NITROSTAT) 0.4 MG SL tablet Place 1 tablet (0.4 mg total) under the tongue every 5 (five) minutes as needed for chest pain. 11/28/13  Yes Charlynne Cousins, MD  pantoprazole (PROTONIX) 40 MG tablet Take 40 mg by mouth daily. 01/04/18  Yes [provider]  Polyethyl Glycol-Propyl Glycol (SYSTANE) 0.4-0.3 % SOLN Place 1 drop into both eyes 2 (two) times daily as needed (dry eyes).   Yes [provider]    potassium chloride (K-DUR) 10 MEQ tablet Take 10 mEq by mouth 2 (two) times a week. Tuesday and Friday   Yes [provider]  metoprolol (LOPRESSOR) 50 MG tablet TAKE 1/2 A TAB BY MOUTH TWICE A DAY Patient not taking: No sig reported 03/30/16   Deboraha Sprang, MD    Family History Family History  Problem Relation Age of Onset  . Heart disease Sister   . Breast cancer Sister     Social History Social History   Tobacco Use  . Smoking status: Former Smoker    Packs/day: 1.00    Years: 35.00    Pack years: 35.00    Types: Cigarettes    Last attempt to quit: 03/16/1987    Years since quitting: 30.8  .  Smokeless tobacco: Never Used  Substance Use Topics  . Alcohol use: No  . Drug use: No     Allergies   Ace inhibitors and Hydrocodone-acetaminophen   Review of Systems Review of Systems  Constitutional: Positive for diaphoresis. Negative for chills and fever.  HENT: Negative for congestion and rhinorrhea.   Eyes: Negative for redness and visual disturbance.  Respiratory: Positive for shortness of breath. Negative for wheezing.   Cardiovascular: Positive for chest pain. Negative for palpitations.  Gastrointestinal: Negative for nausea and vomiting.  Genitourinary: Negative for dysuria and urgency.  Musculoskeletal: Negative for arthralgias and myalgias.  Skin: Negative for pallor and wound.  Neurological: Negative for dizziness and headaches.     Physical Exam Updated Vital Signs BP (!) 168/74   Pulse 81   Temp 98 F (36.7 C) (Oral)   Resp 20   Ht 5\' 3"  (1.6 m)   Wt 46.7 kg   SpO2 100%   BMI 18.25 kg/m   Physical Exam  Constitutional: She is oriented to person, place, and time. She appears well-developed and well-nourished. No distress.  HENT:  Head: Normocephalic and atraumatic.  Eyes: Pupils are equal, round, and reactive to light. EOM are normal.  Neck: Normal range of motion. Neck supple.  Cardiovascular: Normal rate and regular rhythm. Exam  reveals no gallop and no friction rub.  No murmur heard. Pulmonary/Chest: Effort normal. She has no wheezes. She has no rales.  Abdominal: Soft. She exhibits no distension and no mass. There is no tenderness. There is no guarding.  Musculoskeletal: She exhibits no edema or tenderness.  Neurological: She is alert and oriented to person, place, and time.  Skin: Skin is warm and dry. She is not diaphoretic.  Psychiatric: She has a normal mood and affect. Her behavior is normal.  Nursing note and vitals reviewed.    ED Treatments / Results  Labs (all labs ordered are listed, but only abnormal results are displayed) Labs Reviewed  BASIC METABOLIC PANEL - Abnormal; Notable for the following components:      Result Value   Chloride 97 (*)    Glucose, Bld 123 (*)    GFR calc non Af Amer 59 (*)    All other components within normal limits  CBC  MAGNESIUM  PHOSPHORUS  TROPONIN I  TROPONIN I  I-STAT TROPONIN, ED    EKG EKG Interpretation  Date/Time:  Sunday January 08 2018 10:57:54 EDT Ventricular Rate:  74 PR Interval:    QRS Duration: 134 QT Interval:  420 QTC Calculation: 466 R Axis:   -89 Text Interpretation:  Ventricular-paced rhythm Abnormal ECG No significant change since last tracing Confirmed by Deno Etienne [phone]) on 01/08/2018 12:26:27 PM   Radiology Dg Chest 2 View  Result Date: 01/08/2018 CLINICAL DATA:  Shortness of breath. EXAM: CHEST - 2 VIEW COMPARISON:  03/29/2007 FINDINGS: 18 the cardiac silhouette, mediastinal and hilar contours are stable. There is tortuosity and calcification of the thoracic aorta. The pacer wires are stable. Stable underlying emphysematous changes and pulmonary scarring. No acute overlying pulmonary process such as edema, infiltrates or effusions. The bony thorax is intact. IMPRESSION: Chronic emphysematous changes but no acute overlying pulmonary findings. Electronically Signed   By: Marijo Sanes M.D.   On: 01/08/2018 12:29     Procedures Procedures (including critical care time)  Medications Ordered in ED Medications  famotidine (PEPCID) IVPB 20 mg premix (20 mg Intravenous New Bag/Given 01/08/18 1542)  acetaminophen (TYLENOL) tablet 650 mg (has no administration in  time range)  ondansetron (ZOFRAN) injection 4 mg (has no administration in time range)  heparin injection 5,000 Units (5,000 Units Subcutaneous Given 01/08/18 1532)  aspirin EC tablet 81 mg (has no administration in time range)  ALPRAZolam (XANAX) tablet 0.5 mg (has no administration in time range)  vitamin B-12 (CYANOCOBALAMIN) tablet 1,000 mcg (has no administration in time range)  cholecalciferol (VITAMIN D) tablet 1,000 Units (has no administration in time range)  calcium carbonate (OS-CAL - dosed in mg of elemental calcium) tablet 1,250 mg (has no administration in time range)  furosemide (LASIX) tablet 20 mg (has no administration in time range)  isosorbide mononitrate (IMDUR) 24 hr tablet 30 mg (has no administration in time range)  levalbuterol (XOPENEX) nebulizer solution 0.63 mg (has no administration in time range)  losartan (COZAAR) tablet 25 mg (has no administration in time range)  potassium chloride (K-DUR) CR tablet 10 mEq (has no administration in time range)  pantoprazole (PROTONIX) EC tablet 40 mg (has no administration in time range)  nitroGLYCERIN (NITROSTAT) SL tablet 0.4 mg (has no administration in time range)  potassium chloride SA (K-DUR,KLOR-CON) CR tablet 20 mEq (has no administration in time range)  polyvinyl alcohol (LIQUIFILM TEARS) 1.4 % ophthalmic solution 1 drop (has no administration in time range)  0.9 %  sodium chloride infusion (35 mLs Intravenous New Bag/Given 01/08/18 1540)  alum & mag hydroxide-simeth (MAALOX/MYLANTA) 200-200-20 MG/5ML suspension 15 mL (15 mLs Oral Given 01/08/18 1314)     Initial Impression / Assessment and Plan / ED Course  I have reviewed the triage vital signs and the nursing  notes.  Pertinent labs & imaging results that were available during my care of the patient were reviewed by me and considered in my medical decision making (see chart for details).     84 yo F with a chief complaint of chest pain.  Patient with a history of heart attack in the past.  Not sure if this feels the same.  She has been having burning pain to her chest with some diaphoresis.  The EKG is unchanged the paced.  Troponin is negative.  I discussed the case with Dr. Acie Fredrickson, cardiology recommended hospitalist admission for serial troponins.  Felt unlikely to require intervention at this age.    The patients results and plan were reviewed and discussed.   Any x-rays performed were independently reviewed by myself.   Differential diagnosis were considered with the presenting HPI.  Medications  famotidine (PEPCID) IVPB 20 mg premix (20 mg Intravenous New Bag/Given 01/08/18 1542)  acetaminophen (TYLENOL) tablet 650 mg (has no administration in time range)  ondansetron (ZOFRAN) injection 4 mg (has no administration in time range)  heparin injection 5,000 Units (5,000 Units Subcutaneous Given 01/08/18 1532)  aspirin EC tablet 81 mg (has no administration in time range)  ALPRAZolam (XANAX) tablet 0.5 mg (has no administration in time range)  vitamin B-12 (CYANOCOBALAMIN) tablet 1,000 mcg (has no administration in time range)  cholecalciferol (VITAMIN D) tablet 1,000 Units (has no administration in time range)  calcium carbonate (OS-CAL - dosed in mg of elemental calcium) tablet 1,250 mg (has no administration in time range)  furosemide (LASIX) tablet 20 mg (has no administration in time range)  isosorbide mononitrate (IMDUR) 24 hr tablet 30 mg (has no administration in time range)  levalbuterol (XOPENEX) nebulizer solution 0.63 mg (has no administration in time range)  losartan (COZAAR) tablet 25 mg (has no administration in time range)  potassium chloride (K-DUR) CR tablet 10 mEq (  has no  administration in time range)  pantoprazole (PROTONIX) EC tablet 40 mg (has no administration in time range)  nitroGLYCERIN (NITROSTAT) SL tablet 0.4 mg (has no administration in time range)  potassium chloride SA (K-DUR,KLOR-CON) CR tablet 20 mEq (has no administration in time range)  polyvinyl alcohol (LIQUIFILM TEARS) 1.4 % ophthalmic solution 1 drop (has no administration in time range)  0.9 %  sodium chloride infusion (35 mLs Intravenous New Bag/Given 01/08/18 1540)  alum & mag hydroxide-simeth (MAALOX/MYLANTA) 200-200-20 MG/5ML suspension 15 mL (15 mLs Oral Given 01/08/18 1314)    Vitals:   01/08/18 1102 01/08/18 1215 01/08/18 1230 01/08/18 1300  BP:  134/64 130/64 (!) 168/74  Pulse:  74 73 81  Resp:  18 (!) 22 20  Temp:      TempSrc:      SpO2:  98% 100% 100%  Weight: 46.7 kg     Height: 5\' 3"  (1.6 m)       Final diagnoses:  Chest pain with high risk for cardiac etiology    Admission/ observation were discussed with the admitting physician, patient and/or family and they are comfortable with the plan.   Final Clinical Impressions(s) / ED Diagnoses   Final diagnoses:  Chest pain with high risk for cardiac etiology    ED Discharge Orders    None       Deno Etienne, DO 01/08/18 1603

## 2018-01-08 NOTE — ED Triage Notes (Signed)
Pt. Stated, Ive had SOB and sweating for a week. And this morning I just couldn't take it anymore.

## 2018-01-08 NOTE — H&P (Signed)
History and Physical    Jamie [name] TZG:[id number] DOB: [birth year] DOA: 01/08/2018  PCP: Crist Infante, MD   Patient coming from: Home.  I have personally briefly reviewed patient's old medical records in [place]  Chief Complaint: Chest pain and shortness of breath.  HPI: Jamie [name] is a 84 y.o. female with medical history significant of asthma, coronary artery disease, ischemic cardiomyopathy, chronic combined systolic/diastolic CHF, history of complete AV block/pacemaker placement, COPD, GERD, hypertension, osteoarthritis, osteopenia, history of pneumonia, history of tachycardia, history of a UTI who is coming to the emergency department with complaints of chest pain, shortness of breath and diaphoresis for the last 3 to 4 days. She recently has stopped her ranitidine for GERD after it was unannounced that he was going to be pulled from the market due to a carcinogen contaminant.  She denies fever, chills, sore throat, rhinorrhea, wheezing, hemoptysis, palpitations, dizziness, diaphoresis, PND, orthopnea or pitting edema of the lower extremities.  No abdominal pain, nausea, emesis, diarrhea, constipation, melena or hematochezia.  No dysuria, frequency or hematuria.  No heat or cold intolerance.  No polyuria, polydipsia, polyphagia or blurred vision.  ED Course: Initial vital signs temperature 98 F, pulse 86, respirations 22, blood pressure 134/70 mmHg and O2 sat 99% on room air.  She was given 15 mL's of Mylanta in the emergency department.  The case was briefly discussed by Dr. Tyrone Nine with Dr. Acie Fredrickson who recommended office with serial troponins and hospitalist admission.    White count was 6.6, hemoglobin 13.8 g/dL and platelets 233.  Her BMP showed a sodium of 97 mmol/L and a glucose of 123 mg/dL.  All other values are within normal limits.  First troponin was normal.  Her EKG showed a ventricular paced rhythm.  Chest radiograph shows COPD/emphysema, but no acute cardiopulmonary  pathology.  Review of Systems: As per HPI otherwise 10 point review of systems negative.   Past Medical History:  Diagnosis Date  . Asthma   . Carotid stenosis    a. Carotid US (9/15):  Bilateral ICA 1-39%  . Chronic combined systolic and diastolic CHF (congestive heart failure) ([place])   . Complete AV block ([place])   . COPD (chronic obstructive pulmonary disease) ([place])   . Family history of anesthesia complication    SISTER HAS DIFFICULTY WAKING  . GERD (gastroesophageal reflux disease)   . Hypertension   . Ischemic cardiomyopathy    a.  Echo (9/15):  EF 40-45%, ant and apical and inferoapical HK, Gr 1 DD, trivial MR, mild TR, PASP 23 mmHg;  b. Myoview (9/15) large scar in mid LAD territory, no peri-infarct ischemia, EF 60%, apical AK; intermediate risk >> patient opted for med Rx  . Osteoarthritis   . Osteopenia   . Pacemaker   . Pneumonia   . Presence of permanent cardiac pacemaker   . Shortness of breath   . Tachycardia   . UTI (urinary tract infection)     Past Surgical History:  Procedure Laterality Date  . CHOLECYSTECTOMY    . EYE SURGERY  10/2004  . HERNIA REPAIR  11/2002  . PACEMAKER PLACEMENT     [employer]     reports that she quit smoking about 30 years ago. Her smoking use included cigarettes. She has a 35.00 pack-year smoking history. She has never used smokeless tobacco. She reports that she does not drink alcohol or use drugs.  Allergies  Allergen Reactions  . Ace Inhibitors Cough  . Hydrocodone-Acetaminophen Other (See Comments)  GI distress    Family History  Problem Relation Age of Onset  . Heart disease Sister   . Breast cancer Sister    Prior to Admission medications   Medication Sig Start Date End Date Taking? Authorizing Provider  albuterol (PROAIR HFA) 108 (90 BASE) MCG/ACT inhaler Inhale 2 puffs into the lungs every 6 (six) hours as needed for wheezing or shortness of breath.    Yes [provider]  ALPRAZolam Duanne Moron) 0.5 MG tablet  Take 0.5 mg by mouth at bedtime as needed for sleep.    Yes [provider]  aspirin EC 81 MG tablet Take 81 mg by mouth daily.    Yes [provider]  beta carotene w/minerals (OCUVITE) tablet Take 1 tablet by mouth daily.   Yes [provider]  Calcium Carbonate (CALTRATE 600) 1500 MG TABS Take 600 mg by mouth daily.    Yes [provider]  cholecalciferol (VITAMIN D) 1000 UNITS tablet Take 1,000 Units by mouth daily.     Yes [provider]  Coenzyme Q10 (CO Q 10 PO) Take 1 capsule by mouth daily.   Yes [provider]  cyanocobalamin 1000 MCG tablet Take 1,000 mcg by mouth daily.    Yes [provider]  furosemide (LASIX) 20 MG tablet Take 1 tablet (20 mg total) by mouth every other day. Patient taking differently: Take 20 mg by mouth daily.  05/23/16  Yes Amin, Jeanella Flattery, MD  isosorbide mononitrate (IMDUR) 30 MG 24 hr tablet Take 1 tablet (30 mg total) by mouth daily. Please keep upcoming appt in December with Dr. Caryl Comes for future refills. Thank you Patient taking differently: Take 30 mg by mouth daily.  01/04/18  Yes Deboraha Sprang, MD  levalbuterol Penne Lash) 0.63 MG/3ML nebulizer solution Take 0.63 mg by nebulization every 6 (six) hours as needed for shortness of breath. 07/25/12  Yes Rai, Ripudeep K, MD  losartan (COZAAR) 25 MG tablet TAKE ONE TABLET BY MOUTH ONE TIME DAILY Patient taking differently: Take 25 mg by mouth daily.  03/28/17  Yes Richardson Dopp T, PA-C  Magnesium 250 MG TABS Take 250 mg by mouth daily.    Yes [provider]  nitroGLYCERIN (NITROSTAT) 0.4 MG SL tablet Place 1 tablet (0.4 mg total) under the tongue every 5 (five) minutes as needed for chest pain. 11/28/13  Yes Charlynne Cousins, MD  pantoprazole (PROTONIX) 40 MG tablet Take 40 mg by mouth daily. 01/04/18  Yes [provider]  Polyethyl Glycol-Propyl Glycol (SYSTANE) 0.4-0.3 % SOLN Place 1 drop into both eyes 2 (two) times daily as  needed (dry eyes).   Yes [provider]  potassium chloride (K-DUR) 10 MEQ tablet Take 10 mEq by mouth 2 (two) times a week. Tuesday and Friday   Yes [provider]  metoprolol (LOPRESSOR) 50 MG tablet TAKE 1/2 A TAB BY MOUTH TWICE A DAY Patient not taking: No sig reported 03/30/16   Deboraha Sprang, MD    Physical Exam: Vitals:   01/08/18 1102 01/08/18 1215 01/08/18 1230 01/08/18 1300  BP:  134/64 130/64 (!) 168/74  Pulse:  74 73 81  Resp:  18 (!) 22 20  Temp:      TempSrc:      SpO2:  98% 100% 100%  Weight: 46.7 kg     Height: 5\' 3"  (1.6 m)       Constitutional: NAD, calm, comfortable Eyes: PERRL, lids and conjunctivae normal ENMT: Mucous membranes are moist. Posterior  pharynx clear of any exudate or lesions. Neck: normal, supple, no masses, no thyromegaly Respiratory: clear to auscultation bilaterally, no wheezing, no crackles. Normal respiratory effort. No accessory muscle use.  Cardiovascular: Regular rate and rhythm, no murmurs / rubs / gallops. No extremity edema. 2+ pedal pulses. No carotid bruits.  Abdomen: no tenderness, no masses palpated. No hepatosplenomegaly. Bowel sounds positive.  Musculoskeletal: no clubbing / cyanosis. Good ROM, no contractures. Normal muscle tone.  Skin: no rashes, lesions, ulcers. No induration Neurologic: CN 2-12 grossly intact. Sensation intact, DTR normal. Strength 5/5 in all 4.  Psychiatric: Normal judgment and insight. Alert and oriented x 4. Normal mood.   Labs on Admission: I have personally reviewed following labs and imaging studies  CBC: Recent Labs  Lab 01/08/18 1109  WBC 6.6  HGB 13.8  HCT 42.9  MCV 98.4  PLT 270   Basic Metabolic Panel: Recent Labs  Lab 01/08/18 1109  NA 136  K 3.7  CL 97*  CO2 31  GLUCOSE 123*  BUN 9  CREATININE 0.83  CALCIUM 9.3   GFR: Estimated Creatinine Clearance: 31.2 mL/min (by C-G formula based on SCr of 0.83 mg/dL). Liver Function Tests: No results for input(s):  AST, ALT, ALKPHOS, BILITOT, PROT, ALBUMIN in the last 168 hours. No results for input(s): LIPASE, AMYLASE in the last 168 hours. No results for input(s): AMMONIA in the last 168 hours. Coagulation Profile: No results for input(s): INR, PROTIME in the last 168 hours. Cardiac Enzymes: No results for input(s): CKTOTAL, CKMB, CKMBINDEX, TROPONINI in the last 168 hours. BNP (last 3 results) No results for input(s): PROBNP in the last 8760 hours. HbA1C: No results for input(s): HGBA1C in the last 72 hours. CBG: No results for input(s): GLUCAP in the last 168 hours. Lipid Profile: No results for input(s): CHOL, HDL, LDLCALC, TRIG, CHOLHDL, LDLDIRECT in the last 72 hours. Thyroid Function Tests: No results for input(s): TSH, T4TOTAL, FREET4, T3FREE, THYROIDAB in the last 72 hours. Anemia Panel: No results for input(s): VITAMINB12, FOLATE, FERRITIN, TIBC, IRON, RETICCTPCT in the last 72 hours. Urine analysis:    Component Value Date/Time   COLORURINE YELLOW 05/19/2016 0836   APPEARANCEUR CLEAR 05/19/2016 0836   LABSPEC 1.010 05/19/2016 0836   PHURINE 5.0 05/19/2016 0836   GLUCOSEU NEGATIVE 05/19/2016 0836   HGBUR MODERATE (A) 05/19/2016 0836   BILIRUBINUR NEGATIVE 05/19/2016 0836   KETONESUR 5 (A) 05/19/2016 0836   PROTEINUR NEGATIVE 05/19/2016 0836   UROBILINOGEN 0.2 08/04/2014 2211   NITRITE NEGATIVE 05/19/2016 0836   LEUKOCYTESUR TRACE (A) 05/19/2016 0836    Radiological Exams on Admission: Dg Chest 2 View  Result Date: 01/08/2018 CLINICAL DATA:  Shortness of breath. EXAM: CHEST - 2 VIEW COMPARISON:  03/29/2007 FINDINGS: 18 the cardiac silhouette, mediastinal and hilar contours are stable. There is tortuosity and calcification of the thoracic aorta. The pacer wires are stable. Stable underlying emphysematous changes and pulmonary scarring. No acute overlying pulmonary process such as edema, infiltrates or effusions. The bony thorax is intact. IMPRESSION: Chronic emphysematous  changes but no acute overlying pulmonary findings. Electronically Signed   By: Marijo Sanes M.D.   On: 01/08/2018 12:29   05/20/2016 echocardiogram complete ------------------------------------------------------------------- LV EF: 55% -   60%  ------------------------------------------------------------------- Indications:      Chest pain 786.51.  ------------------------------------------------------------------- History:   PMH:  GERD.  Coronary artery disease.  Chronic obstructive pulmonary disease.  PMH:  Complete Heart Block.  ------------------------------------------------------------------- Study Conclusions  - Left ventricle: The cavity size was normal.  Wall thickness was   normal. Systolic function was normal. The estimated ejection   fraction was in the range of 55% to 60%. Wall motion was normal;   there were no regional wall motion abnormalities. Doppler   parameters are consistent with abnormal left ventricular   relaxation (grade 1 diastolic dysfunction). - Aortic valve: There was trivial regurgitation. - Mitral valve: Calcified annulus. Mildly thickened leaflets . - Atrial septum: There was an atrial septal aneurysm.  Impressions:  - Normal LV systolic function; grade 1 diastolic dysfunction;   calcified aortic valve with trace AI; mild TR.  EKG: Independently reviewed.  Vent. rate 74 BPM PR interval * ms QRS duration 134 ms QT/QTc 420/466 ms P-R-T axes 81 -89 79 Ventricular paced rhythm Abnormal EKG  Assessment/Plan Principal Problem:   Chest pain Ovation/telemetry. Continue supplemental oxygen. Continue aspirin. Sublingual nitroglycerin as needed. Trend troponin level. Check echocardiogram.  Active Problems:   HTN (hypertension) Continue losartan 25 mg p.o. daily. Also on furosemide and Imdur.    COPD (chronic obstructive pulmonary disease) with emphysema (HCC) Supplemental oxygen as needed. Continue as needed bronchodilators.     Dyslipidemia Not taking a statin. Continue lifestyle modifications.    Chronic combined systolic and diastolic heart failure, NYHA class 2 (HCC) No signs of decompensation. Continue furosemide 20 mg p.o. daily. Continue isosorbide mononitrate 30 mg p.o. daily. Continue losartan 25 mg p.o. daily. Currently not taking her metoprolol.    CAD (coronary artery disease) The patient has stopped taking her beta-blocker. Continue aspirin and long-acting nitrates.    DVT prophylaxis: Heparin SQ. Code Status: Full code. Family Communication:  Disposition Plan: Observation for troponin level trending and cardiac monitoring. Consults called:  Admission status: Telemetry/observation.   Reubin Milan MD [name] Pager [phone]  If 7PM-7AM, please contact night-coverage [email or web address] Password [email or web address]  01/08/2018, 1:33 PM

## 2018-01-08 NOTE — ED Notes (Signed)
Pt in xray, radiology to transport pt to D30 once xray completed.

## 2018-01-08 NOTE — ED Notes (Signed)
Pt in xray

## 2018-01-09 ENCOUNTER — Encounter (HOSPITAL_COMMUNITY): Payer: Self-pay | Admitting: Physician Assistant

## 2018-01-09 DIAGNOSIS — I5042 Chronic combined systolic (congestive) and diastolic (congestive) heart failure: Secondary | ICD-10-CM

## 2018-01-09 DIAGNOSIS — I2510 Atherosclerotic heart disease of native coronary artery without angina pectoris: Secondary | ICD-10-CM

## 2018-01-09 DIAGNOSIS — K219 Gastro-esophageal reflux disease without esophagitis: Principal | ICD-10-CM

## 2018-01-09 DIAGNOSIS — J439 Emphysema, unspecified: Secondary | ICD-10-CM | POA: Diagnosis not present

## 2018-01-09 DIAGNOSIS — I10 Essential (primary) hypertension: Secondary | ICD-10-CM | POA: Diagnosis not present

## 2018-01-09 DIAGNOSIS — R079 Chest pain, unspecified: Secondary | ICD-10-CM

## 2018-01-09 DIAGNOSIS — E785 Hyperlipidemia, unspecified: Secondary | ICD-10-CM | POA: Diagnosis not present

## 2018-01-09 LAB — TROPONIN I: Troponin I: <0.03 ng/mL (ref <0.03)

## 2018-01-09 MED ORDER — FUROSEMIDE 20 MG PO TABS: 20.0000 mg | ORAL_TABLET | Freq: Every day | ORAL | Status: DC

## 2018-01-09 NOTE — Progress Notes (Signed)
Neb treatment complete and pt stte she feels better. Lungs bilateral clear with greather excursion. Tachypnea decreased. Rate niow20.

## 2018-01-09 NOTE — Progress Notes (Signed)
Respiratory called to request PRN breathing treatment. Medication at bedside and respiratory therapist made aware.

## 2018-01-09 NOTE — Progress Notes (Signed)
Pt c/o shortness of breath and request neb treatment.

## 2018-01-09 NOTE — Care Management Obs Status (Signed)
[facility] NOTIFICATION   Patient Details  Name: Jamie [name] MRN: [id number] Date of Birth: 1922-08-13   Medicare Observation Status Notification Given:  Yes    Midge Minium RN, BSN, NCM-BC, ACM-RN [phone] 01/09/2018, 2:01 PM

## 2018-01-09 NOTE — Progress Notes (Signed)
Pt resting quietly. toileting offered and declined.

## 2018-01-09 NOTE — Progress Notes (Signed)
Pt oob to bathroom by family.

## 2018-01-09 NOTE — Consult Note (Addendum)
Cardiology Consultation:   Patient ID: Jamie [name]; [id number]; [birth year]   Admit date: 01/08/2018 Date of Consult: 01/09/2018  Primary Care Provider: Crist Infante, MD Primary Cardiologist: Virl Axe, MD Primary Electrophysiologist:  None  Chief Complaint: warmth in her neck  Patient Profile:   Jamie [name] is a 84 y.o. female with a hx of presumed CAD/LAD infact by nuc/echo 11/2013, ischemic cardiomyopathy, chronic combined CHF with normalized EF 05/2016, presyncope felt due to orthostasis, paroxysmal atrial fib (noted on monitor 11/2017), possible prior TIA, COPD with chronic respiratory failure on home O2, complete AV block s/p PPM, HTN, asthma, DNR who is being seen today for the evaluation of neck warmth at the request of Dr Eliseo Squires (chest pain unit protocol).  History of Present Illness:   Ms. Freet had a cath in 10/2010 showing no obstructive disease. In 11/2013 she presented with elevated troponin in setting of visual disturbance and dizziness (troponin peak 0.45). Symptoms were felt concerning for TIA. Carotid duplex 1-39% BICA. Echo showed worsening LV function with an EF of 40-45% with wall motion abnormalities in the LAD territory. Findings appeared to be consistent with a completed LAD infarction. Patient noted history of indigestion about 3 weeks prior to admission with associated back pain. Patient preferred to avoid invasive testing given advanced age. Inpatient Cardiolite was obtained demonstrating large scar in the mid LAD territory but no ischemia (EF was normal on Cardiolite). No further workup was planned; medications were adjusted with addition of isosorbide. Crestor was decreased due to confusion.She was seen in the hospital again 05/2016 for chest pain and ruled out for MI and PE. Echo 05/2016 showed EF 55-60%, grade 1 DD, + atrial septal aneurysm. She has since seen Dr. Caryl Comes in follow-up who noted recurrent episodes of pre-syncope that were orthostatic in nature. He  advised abdominal binder and elevating head of bed which has resolved symptoms. Last device check in 11/2017 indicated 0.2% mode switching with 11.5 hours of AF in May, new. Dr. Caryl Comes did not make any changes at that time and next interrogation is planned for 02/2018.  She presented with symptoms of warmth in her neck/throat that has been occurring intermittently for 6 months' time, worse in the morning. She was on Zantac for a while and this was able to be weaned to PRN when symptoms improved. Then she was off it completely during the recall. 2 days ago she started Protonix. She describes the sensation as "heat" but not pain or burning. She feels better after she uses her neb and moves about her day and does activities. She's been more SOB the last week but this improves with nebs as well. She also reports a mild cough but nonproductive. She has not had any recent exertional angina. Labs show normal troponin x 3, normal CBC, normal Cr, K 3.7. CXR shows chronic emphysematous changes but no acute overlying pulmonary Findings. Vitals show mild hypertension with range of 130/64 to 168/74 and telemetry shows occasional PVCs otherwise primarily atrial sensing, V pacing.  Past Medical History:  Diagnosis Date  . Asthma   . Carotid stenosis    a. Carotid US (9/15):  Bilateral ICA 1-39%  . Chronic combined systolic and diastolic CHF (congestive heart failure) ([place])    a. EF 40-45% in 11/2013, improved to 55-60% in 05/2016.  Marland Kitchen Complete AV block ([place])   . COPD (chronic obstructive pulmonary disease) ([place])   . Family history of anesthesia complication    SISTER HAS DIFFICULTY WAKING  .  GERD (gastroesophageal reflux disease)   . Hypertension   . Ischemic cardiomyopathy    a.  Echo (9/15):  EF 40-45%, ant and apical and inferoapical HK, Gr 1 DD, trivial MR, mild TR, PASP 23 mmHg;  b. Myoview (9/15) large scar in mid LAD territory, no peri-infarct ischemia, EF 60%, apical AK; intermediate risk >> patient opted for  med Rx. b. EF 55-60% by echo 05/2016.  . Orthostasis   . Osteoarthritis   . Osteopenia   . Pacemaker   . Pneumonia   . Pre-syncope   . Presence of permanent cardiac pacemaker   . Shortness of breath   . Tachycardia   . TIA (transient ischemic attack)    a. possible TIA 11/2013.  Marland Kitchen UTI (urinary tract infection)     Past Surgical History:  Procedure Laterality Date  . CHOLECYSTECTOMY    . EYE SURGERY  10/2004  . HERNIA REPAIR  11/2002  . PACEMAKER PLACEMENT     [employer]     Inpatient Medications: Scheduled Meds: . aspirin EC  81 mg Oral Daily  . calcium carbonate  1,250 mg Oral Q breakfast  . cholecalciferol  1,000 Units Oral Daily  . furosemide  20 mg Oral Daily  . heparin  5,000 Units Subcutaneous Q8H  . isosorbide mononitrate  30 mg Oral Daily  . losartan  25 mg Oral Daily  . pantoprazole  40 mg Oral Daily  . [START ON 01/10/2018] potassium chloride  10 mEq Oral Once per day on Tue Fri  . cyanocobalamin  1,000 mcg Oral Daily   Continuous Infusions: . sodium chloride Stopped (01/09/18 0400)   PRN Meds: sodium chloride, acetaminophen, ALPRAZolam, levalbuterol, nitroGLYCERIN, ondansetron (ZOFRAN) IV, polyvinyl alcohol  Home Meds: Prior to Admission medications   Medication Sig Start Date End Date Taking? Authorizing Provider  albuterol (PROAIR HFA) 108 (90 BASE) MCG/ACT inhaler Inhale 2 puffs into the lungs every 6 (six) hours as needed for wheezing or shortness of breath.    Yes [provider]  ALPRAZolam Duanne Moron) 0.5 MG tablet Take 0.5 mg by mouth at bedtime as needed for sleep.    Yes [provider]  aspirin EC 81 MG tablet Take 81 mg by mouth daily.    Yes [provider]  beta carotene w/minerals (OCUVITE) tablet Take 1 tablet by mouth daily.   Yes [provider]  Calcium Carbonate (CALTRATE 600) 1500 MG TABS Take 600 mg by mouth daily.    Yes [provider]  cholecalciferol (VITAMIN D) 1000 UNITS tablet Take 1,000  Units by mouth daily.     Yes [provider]  Coenzyme Q10 (CO Q 10 PO) Take 1 capsule by mouth daily.   Yes [provider]  cyanocobalamin 1000 MCG tablet Take 1,000 mcg by mouth daily.    Yes [provider]  furosemide (LASIX) 20 MG tablet Take 1 tablet (20 mg total) by mouth every other day. Patient taking differently: Take 20 mg by mouth daily.  05/23/16  Yes Amin, Jeanella Flattery, MD  isosorbide mononitrate (IMDUR) 30 MG 24 hr tablet Take 1 tablet (30 mg total) by mouth daily. Please keep upcoming appt in December with Dr. Caryl Comes for future refills. Thank you Patient taking differently: Take 30 mg by mouth daily.  01/04/18  Yes Deboraha Sprang, MD  levalbuterol Penne Lash) 0.63 MG/3ML nebulizer solution Take 0.63 mg by nebulization every 6 (six) hours as needed for shortness of breath. 07/25/12  Yes Rai, Vernelle Emerald, MD  losartan (COZAAR) 25 MG tablet TAKE ONE TABLET BY MOUTH ONE TIME DAILY Patient taking differently: Take 25 mg by mouth daily.  03/28/17  Yes Richardson Dopp T, PA-C  Magnesium 250 MG TABS Take 250 mg by mouth daily.    Yes [provider]  nitroGLYCERIN (NITROSTAT) 0.4 MG SL tablet Place 1 tablet (0.4 mg total) under the tongue every 5 (five) minutes as needed for chest pain. 11/28/13  Yes Charlynne Cousins, MD  pantoprazole (PROTONIX) 40 MG tablet Take 40 mg by mouth daily. 01/04/18  Yes [provider]  Polyethyl Glycol-Propyl Glycol (SYSTANE) 0.4-0.3 % SOLN Place 1 drop into both eyes 2 (two) times daily as needed (dry eyes).   Yes [provider]  potassium chloride (K-DUR) 10 MEQ tablet Take 10 mEq by mouth 2 (two) times a week. Tuesday and Friday   Yes [provider]  metoprolol (LOPRESSOR) 50 MG tablet TAKE 1/2 A TAB BY MOUTH TWICE A DAY Patient not taking: No sig reported 03/30/16   Deboraha Sprang, MD    Allergies:    Allergies  Allergen Reactions  . Ace Inhibitors Cough  . Hydrocodone-Acetaminophen Other  (See Comments)     GI distress    Social History:   Social History   Socioeconomic History  . Marital status: Widowed    Spouse name: Not on file  . Number of children: Not on file  . Years of education: Not on file  . Highest education level: Not on file  Occupational History  . Occupation: retired    Fish farm manager: RETIRED  Social Needs  . Financial resource strain: Not hard at all  . Food insecurity:    Worry: Never true    Inability: Never true  . Transportation needs:    Medical: No    Non-medical: No  Tobacco Use  . Smoking status: Former Smoker    Packs/day: 1.00    Years: 35.00    Pack years: 35.00    Types: Cigarettes    Last attempt to quit: 03/16/1987    Years since quitting: 30.8  . Smokeless tobacco: Never Used  Substance and Sexual Activity  . Alcohol use: No  . Drug use: No  . Sexual activity: Never    Birth control/protection: Post-menopausal  Lifestyle  . Physical activity:    Days per week: 7 days    Minutes per session: 30 min  . Stress: Not at all  Relationships  . Social connections:    Talks on phone: Twice a week    Gets together: More than three times a week    Attends religious service: Never    Active member of club or organization: No    Attends meetings of clubs or organizations: Never    Relationship status: Widowed  . Intimate partner violence:    Fear of current or ex partner: No    Emotionally abused: No    Physically abused: No    Forced sexual activity: No  Other Topics Concern  . Not on file  Social History Narrative  . Not on file    Family History:   The patient's family history includes Breast cancer in her sister; Heart disease in her sister.  ROS:  Please see the history of present illness.  All other ROS reviewed and negative.     Physical Exam/Data:   Vitals:   01/08/18 1939 01/09/18 0300 01/09/18 0400 01/09/18 0600  BP: 137/72 (!) 149/79 (!) 147/54 (!) 159/73  Pulse: 75 81  68 71  Resp: 16 16 16 18   Temp:  98.5 F (36.9 C) 97.6 F (36.4 C)  99.2 F (37.3 C)  TempSrc: Oral Oral  Oral  SpO2: 96% 100% 96% 98%  Weight:      Height:        Intake/Output Summary (Last 24 hours) at 01/09/2018 0913 Last data filed at 01/09/2018 0400 Gross per 24 hour  Intake 36.47 ml  Output -  Net 36.47 ml   Filed Weights   01/08/18 1102  Weight: 46.7 kg   Body mass index is 18.25 kg/m.  General: Well developed, well nourished thin WF, in no acute distress. Head: Normocephalic, atraumatic, sclera non-icteric, no xanthomas, nares are without discharge.  Neck: Negative for carotid bruits. JVD not elevated. Lungs: Diffuse quiet exp wheezing. Breathing is unlabored. Heart: RRR with S1 S2. No murmurs, rubs, or gallops appreciated. Abdomen: Soft, non-tender, non-distended with normoactive bowel sounds. No hepatomegaly. No rebound/guarding. No obvious abdominal masses. Msk:  Strength and tone appear normal for age. Extremities: No clubbing or cyanosis. No edema.  Distal pedal pulses are 2+ and equal bilaterally. Neuro: Alert and oriented X 3. No facial asymmetry. No focal deficit. Moves all extremities spontaneously. Psych:  Responds to questions appropriately with a normal affect.  EKG:  The EKG was personally reviewed and demonstrates atrial sensed V paced rhythm with occasional escape pacing  Laboratory Data:  Chemistry Recent Labs  Lab 01/08/18 1109  NA 136  K 3.7  CL 97*  CO2 31  GLUCOSE 123*  BUN 9  CREATININE 0.83  CALCIUM 9.3  GFRNONAA 59*  GFRAA >60  ANIONGAP 8    No results for input(s): PROT, ALBUMIN, AST, ALT, ALKPHOS, BILITOT in the last 168 hours. Hematology Recent Labs  Lab 01/08/18 1109  WBC 6.6  RBC 4.36  HGB 13.8  HCT 42.9  MCV 98.4  MCH 31.7  MCHC 32.2  RDW 12.8  PLT 233   Cardiac Enzymes Recent Labs  Lab 01/08/18 1650 01/08/18 2334  TROPONINI <0.03 <0.03    Recent Labs  Lab 01/08/18 1122  TROPIPOC 0.01    BNPNo results for input(s): BNP, PROBNP in  the last 168 hours.  DDimer No results for input(s): DDIMER in the last 168 hours.  Radiology/Studies:  Dg Chest 2 View  Result Date: 01/08/2018 CLINICAL DATA:  Shortness of breath. EXAM: CHEST - 2 VIEW COMPARISON:  03/29/2007 FINDINGS: 18 the cardiac silhouette, mediastinal and hilar contours are stable. There is tortuosity and calcification of the thoracic aorta. The pacer wires are stable. Stable underlying emphysematous changes and pulmonary scarring. No acute overlying pulmonary process such as edema, infiltrates or effusions. The bony thorax is intact. IMPRESSION: Chronic emphysematous changes but no acute overlying pulmonary findings. Electronically Signed   By: Marijo Sanes M.D.   On: 01/08/2018 12:29    Assessment and Plan:   1. Neck/arm "heat" - history supports that GERD is more likely the culprit. She states after day 2 of protonix, her complaint was not as bad this AM. She currently feels well. This is less likely angina decubitus given lack of exertional symptoms once we gets up and moving. Do not plan any additional inpatient evaluation. We will f/u as OP.  2. Suspected CAD - no indication for acute intervention. Continue current regimen.  3. Chronic combined CHF with normalized EF - appears euvolemic.  4. Occasional PVCs - consider increasing potassium supplementation to a goal K of 4.0.  5. HTN with history of orthostasis -  patient has not had any further pre-syncopal spells lately and otherwise feels she's doing well. Would not adjust regimen at this time.  Spoke with office, F/u scheduled with Tommye Standard 01/30/18 at 9:30am.  For questions or updates, please contact [facility] Please consult [email or web address] for contact info under Cardiology/STEMI.    Signed, Charlie Pitter, PA-C  01/09/2018 9:13 AM ---------------------------------------------------------------------------------------------   History and all data above reviewed.  Patient examined.  I agree with  the findings as above.  Jamie [name] is currently having her "heat" in her neck symptom, but otherwise feels well. She thinks her discomfort is likely indigestion.  Constitutional: No acute distress Eyes: pupils equally round and reactive to light, sclera non-icteric, normal conjunctiva and lids ENMT: moist mucous membranes Cardiovascular: regular rhythm, normal rate, no murmurs. S1 and S2 normal. Radial pulses normal bilaterally. No jugular venous distention.  Respiratory: clear to auscultation bilaterally GI : normal bowel sounds, soft and nontender. No distention.   MSK: extremities warm, well perfused. No edema.  LYMPH: No lymphadenopathy noted of the head and neck NEURO: grossly nonfocal exam, moves all extremities. PSYCH: alert and oriented x 3, normal mood and affect.   All available labs, radiology testing, previous records reviewed. Agree with documented assessment and plan of my colleague as stated above with the following additions or changes:  Principal Problem:   GERD (gastroesophageal reflux disease) Active Problems:   HTN (hypertension)   COPD (chronic obstructive pulmonary disease) with emphysema (HCC)   Dyslipidemia   Chest pain   Chronic combined systolic and diastolic heart failure, NYHA class 2 (HCC)   CAD (coronary artery disease)    Plan: Her symptoms most likely correspond to the ranitidine/Zantac recall, and recently reinitiating pantoprazole. She could consider pepcid/famotidine as well if she prefers H2 blockade instead of PPI.  Her symptoms do not seem to correspond to ACS. She should follow up with Dr. Olin Pia care team outside the hospital.   [facility] will sign off.   Medication Recommendations:  No change from CV Other recommendations (labs, testing, etc):  None currently Follow up as an outpatient:  On 11/18 with Cardiology - arranged.  Elouise Munroe, MD [facility] 10:51 AM  01/09/2018

## 2018-01-09 NOTE — Progress Notes (Signed)
Pt resting in bed and denies c/o at present.

## 2018-01-09 NOTE — Discharge Summary (Signed)
Physician Discharge Summary  Jamie [name] MCN:[id number] DOB: June 14, 1922 DOA: 01/08/2018  PCP: Jamie Infante, MD  Admit date: 01/08/2018 Discharge date: 01/09/2018  Admitted From: home Discharge disposition: home   Recommendations for Outpatient Follow-Up:   1. Treatment of GERD 2. Monitor for development of aspiration PNA   Discharge Diagnosis:   Principal Problem:   GERD (gastroesophageal reflux disease) Active Problems:   HTN (hypertension)   COPD (chronic obstructive pulmonary disease) with emphysema (HCC)   Dyslipidemia   Chest pain   Chronic combined systolic and diastolic heart failure, NYHA class 2 (HCC)   CAD (coronary artery disease)    Discharge Condition: Improved.  Diet recommendation: GERD diet  Wound care: None.  Code status: Full.   History of Present Illness:   Jamie [name] is a 84 y.o. female with medical history significant of asthma, coronary artery disease, ischemic cardiomyopathy, chronic combined systolic/diastolic CHF, history of complete AV block/pacemaker placement, COPD, GERD, hypertension, osteoarthritis, osteopenia, history of pneumonia, history of tachycardia, history of a UTI who is coming to the emergency department with complaints of chest pain, shortness of breath and diaphoresis for the last 3 to 4 days. She recently has stopped her ranitidine for GERD after it was unannounced that he was going to be pulled from the market due to a carcinogen contaminant.  She denies fever, chills, sore throat, rhinorrhea, wheezing, hemoptysis, palpitations, dizziness, diaphoresis, PND, orthopnea or pitting edema of the lower extremities.  No abdominal pain, nausea, emesis, diarrhea, constipation, melena or hematochezia.  No dysuria, frequency or hematuria.  No heat or cold intolerance.  No polyuria, polydipsia, polyphagia or blurred vision.   Hospital Course by Problem:   Chest pain Ruled out for ACS -seen by cardiology -symptoms more  consistent with GERD- PPI     HTN (hypertension) Continue home meds    COPD (chronic obstructive pulmonary disease) with emphysema ([place]) On 2L [place] Continue as needed bronchodilators.    Dyslipidemia Not taking a statin. Continue lifestyle modifications.    Chronic combined systolic and diastolic heart failure, NYHA class 2 (HCC) Continue home meds    CAD (coronary artery disease) Continue aspirin and long-acting nitrates.    Medical Consultants:    cards  Discharge Exam:   Vitals:   01/09/18 0600 01/09/18 1002  BP: (!) 159/73 (!) 115/57  Pulse: 71 70  Resp: 18 16  Temp: 99.2 F (37.3 C) 99 F (37.2 C)  SpO2: 98% 98%   Vitals:   01/09/18 0300 01/09/18 0400 01/09/18 0600 01/09/18 1002  BP: (!) 149/79 (!) 147/54 (!) 159/73 (!) 115/57  Pulse: 81 68 71 70  Resp: 16 16 18 16   Temp: 97.6 F (36.4 C)  99.2 F (37.3 C) 99 F (37.2 C)  TempSrc: Oral  Oral Oral  SpO2: 100% 96% 98% 98%  Weight:      Height:        General exam: Appears calm and comfortable.   The results of significant diagnostics from this hospitalization (including imaging, microbiology, ancillary and laboratory) are listed below for reference.     Procedures and Diagnostic Studies:   Dg Chest 2 View  Result Date: 01/08/2018 CLINICAL DATA:  Shortness of breath. EXAM: CHEST - 2 VIEW COMPARISON:  03/29/2007 FINDINGS: 18 the cardiac silhouette, mediastinal and hilar contours are stable. There is tortuosity and calcification of the thoracic aorta. The pacer wires are stable. Stable underlying emphysematous changes and pulmonary scarring. No acute overlying pulmonary process such as  edema, infiltrates or effusions. The bony thorax is intact. IMPRESSION: Chronic emphysematous changes but no acute overlying pulmonary findings. Electronically Signed   By: Marijo Sanes M.D.   On: 01/08/2018 12:29     Labs:   Basic Metabolic Panel: Recent Labs  Lab 01/08/18 1109  NA 136  K 3.7  CL 97*    CO2 31  GLUCOSE 123*  BUN 9  CREATININE 0.83  CALCIUM 9.3  MG 2.0  PHOS 3.4   GFR Estimated Creatinine Clearance: 31.2 mL/min (by C-G formula based on SCr of 0.83 mg/dL). Liver Function Tests: No results for input(s): AST, ALT, ALKPHOS, BILITOT, PROT, ALBUMIN in the last 168 hours. No results for input(s): LIPASE, AMYLASE in the last 168 hours. No results for input(s): AMMONIA in the last 168 hours. Coagulation profile No results for input(s): INR, PROTIME in the last 168 hours.  CBC: Recent Labs  Lab 01/08/18 1109  WBC 6.6  HGB 13.8  HCT 42.9  MCV 98.4  PLT 233   Cardiac Enzymes: Recent Labs  Lab 01/08/18 1650 01/08/18 2334  TROPONINI <0.03 <0.03   BNP: Invalid input(s): POCBNP CBG: No results for input(s): GLUCAP in the last 168 hours. D-Dimer No results for input(s): DDIMER in the last 72 hours. Hgb A1c No results for input(s): HGBA1C in the last 72 hours. Lipid Profile No results for input(s): CHOL, HDL, LDLCALC, TRIG, CHOLHDL, LDLDIRECT in the last 72 hours. Thyroid function studies No results for input(s): TSH, T4TOTAL, T3FREE, THYROIDAB in the last 72 hours.  Invalid input(s): FREET3 Anemia work up No results for input(s): VITAMINB12, FOLATE, FERRITIN, TIBC, IRON, RETICCTPCT in the last 72 hours. Microbiology No results found for this or any previous visit (from the past 240 hour(s)).   Discharge Instructions:   Discharge Instructions    Diet - low sodium heart healthy   Complete by:  As directed    Discharge instructions   Complete by:  As directed    resume 2L O2 could consider pepcid/famotidine instead of protonix in future Flutter valve   Increase activity slowly   Complete by:  As directed      Allergies as of 01/09/2018      Reactions   Ace Inhibitors Cough   Hydrocodone-acetaminophen Other (See Comments)    GI distress      Medication List    STOP taking these medications   metoprolol tartrate 50 MG tablet Commonly known  as:  LOPRESSOR     TAKE these medications   ALPRAZolam 0.5 MG tablet Commonly known as:  XANAX Take 0.5 mg by mouth at bedtime as needed for sleep.   aspirin EC 81 MG tablet Take 81 mg by mouth daily.   beta carotene w/minerals tablet Take 1 tablet by mouth daily.   CALTRATE 600 1500 (600 Ca) MG Tabs tablet Generic drug:  calcium carbonate Take 600 mg by mouth daily.   cholecalciferol 1000 units tablet Commonly known as:  VITAMIN D Take 1,000 Units by mouth daily.   CO Q 10 PO Take 1 capsule by mouth daily.   cyanocobalamin 1000 MCG tablet Take 1,000 mcg by mouth daily.   furosemide 20 MG tablet Commonly known as:  LASIX Take 1 tablet (20 mg total) by mouth daily.   isosorbide mononitrate 30 MG 24 hr tablet Commonly known as:  IMDUR Take 1 tablet (30 mg total) by mouth daily. Please keep upcoming appt in December with Dr. Caryl Comes for future refills. Thank you What changed:  additional instructions  levalbuterol 0.63 MG/3ML nebulizer solution Commonly known as:  XOPENEX Take 0.63 mg by nebulization every 6 (six) hours as needed for shortness of breath.   losartan 25 MG tablet Commonly known as:  COZAAR TAKE ONE TABLET BY MOUTH ONE TIME DAILY   Magnesium 250 MG Tabs Take 250 mg by mouth daily.   nitroGLYCERIN 0.4 MG SL tablet Commonly known as:  NITROSTAT Place 1 tablet (0.4 mg total) under the tongue every 5 (five) minutes as needed for chest pain.   pantoprazole 40 MG tablet Commonly known as:  PROTONIX Take 40 mg by mouth daily.   potassium chloride 10 MEQ tablet Commonly known as:  K-DUR Take 10 mEq by mouth 2 (two) times a week. Tuesday and Friday   PROAIR HFA 108 (90 Base) MCG/ACT inhaler Generic drug:  albuterol Inhale 2 puffs into the lungs every 6 (six) hours as needed for wheezing or shortness of breath.   SYSTANE 0.4-0.3 % Soln Generic drug:  Polyethyl Glycol-Propyl Glycol Place 1 drop into both eyes 2 (two) times daily as needed (dry eyes).       Follow-up Information    Baldwin Jamaica, PA-C Follow up.   Specialty:  Cardiology Why:  [facility] - 01/30/18 at 9:30am. Arrive 15 minutes prior to check in. Joseph Art is one of the PAs that works closely with Dr. Olin Pia care team. Contact information: [address] [place] [address] [phone]        Jamie Infante, MD Follow up in 1 week(s).   Specialty:  Internal Medicine Contact information: [address] [place] [address] [phone]            Time coordinating discharge: 25 min  Signed:  Geradine Girt DO  [name] 01/09/2018, 11:53 AM

## 2018-01-09 NOTE — Progress Notes (Signed)
Pt given flutter valve and instruction RT. Pt given CHF packet.

## 2018-01-10 ENCOUNTER — Other Ambulatory Visit: Payer: Self-pay | Admitting: Physician Assistant

## 2018-01-16 ENCOUNTER — Encounter (HOSPITAL_COMMUNITY): Payer: Self-pay

## 2018-01-16 ENCOUNTER — Emergency Department (HOSPITAL_COMMUNITY): Payer: Medicare Other

## 2018-01-16 ENCOUNTER — Inpatient Hospital Stay (HOSPITAL_COMMUNITY): Payer: Medicare Other

## 2018-01-16 ENCOUNTER — Inpatient Hospital Stay (HOSPITAL_COMMUNITY)
Admission: EM | Admit: 2018-01-16 | Discharge: 2018-01-18 | DRG: 963 | Disposition: A | Discharge status: Home Health | Payer: Medicare Other | Attending: Internal Medicine | Admitting: Internal Medicine

## 2018-01-16 DIAGNOSIS — S199XXA Unspecified injury of neck, initial encounter: Secondary | ICD-10-CM | POA: Diagnosis not present

## 2018-01-16 DIAGNOSIS — Z9841 Cataract extraction status, right eye: Secondary | ICD-10-CM | POA: Diagnosis not present

## 2018-01-16 DIAGNOSIS — I5042 Chronic combined systolic (congestive) and diastolic (congestive) heart failure: Secondary | ICD-10-CM | POA: Diagnosis present

## 2018-01-16 DIAGNOSIS — I442 Atrioventricular block, complete: Secondary | ICD-10-CM | POA: Diagnosis present

## 2018-01-16 DIAGNOSIS — Z66 Do not resuscitate: Secondary | ICD-10-CM | POA: Diagnosis present

## 2018-01-16 DIAGNOSIS — Z8673 Personal history of transient ischemic attack (TIA), and cerebral infarction without residual deficits: Secondary | ICD-10-CM | POA: Diagnosis not present

## 2018-01-16 DIAGNOSIS — G8191 Hemiplegia, unspecified affecting right dominant side: Secondary | ICD-10-CM | POA: Diagnosis present

## 2018-01-16 DIAGNOSIS — S72111A Displaced fracture of greater trochanter of right femur, initial encounter for closed fracture: Secondary | ICD-10-CM | POA: Diagnosis present

## 2018-01-16 DIAGNOSIS — S3993XA Unspecified injury of pelvis, initial encounter: Secondary | ICD-10-CM | POA: Diagnosis not present

## 2018-01-16 DIAGNOSIS — K219 Gastro-esophageal reflux disease without esophagitis: Secondary | ICD-10-CM | POA: Diagnosis present

## 2018-01-16 DIAGNOSIS — R32 Unspecified urinary incontinence: Secondary | ICD-10-CM | POA: Diagnosis present

## 2018-01-16 DIAGNOSIS — R062 Wheezing: Secondary | ICD-10-CM | POA: Diagnosis not present

## 2018-01-16 DIAGNOSIS — Z87891 Personal history of nicotine dependence: Secondary | ICD-10-CM | POA: Diagnosis not present

## 2018-01-16 DIAGNOSIS — W1830XA Fall on same level, unspecified, initial encounter: Secondary | ICD-10-CM | POA: Diagnosis present

## 2018-01-16 DIAGNOSIS — M8580 Other specified disorders of bone density and structure, unspecified site: Secondary | ICD-10-CM | POA: Diagnosis present

## 2018-01-16 DIAGNOSIS — W19XXXA Unspecified fall, initial encounter: Secondary | ICD-10-CM

## 2018-01-16 DIAGNOSIS — R0689 Other abnormalities of breathing: Secondary | ICD-10-CM | POA: Diagnosis not present

## 2018-01-16 DIAGNOSIS — Y92000 Kitchen of unspecified non-institutional (private) residence as  the place of occurrence of the external cause: Secondary | ICD-10-CM | POA: Diagnosis not present

## 2018-01-16 DIAGNOSIS — I255 Ischemic cardiomyopathy: Secondary | ICD-10-CM | POA: Diagnosis present

## 2018-01-16 DIAGNOSIS — J439 Emphysema, unspecified: Secondary | ICD-10-CM | POA: Diagnosis not present

## 2018-01-16 DIAGNOSIS — R040 Epistaxis: Secondary | ICD-10-CM | POA: Diagnosis present

## 2018-01-16 DIAGNOSIS — J441 Chronic obstructive pulmonary disease with (acute) exacerbation: Secondary | ICD-10-CM | POA: Diagnosis present

## 2018-01-16 DIAGNOSIS — S065XAA Traumatic subdural hemorrhage with loss of consciousness status unknown, initial encounter: Secondary | ICD-10-CM | POA: Diagnosis present

## 2018-01-16 DIAGNOSIS — S065X0A Traumatic subdural hemorrhage without loss of consciousness, initial encounter: Secondary | ICD-10-CM | POA: Diagnosis not present

## 2018-01-16 DIAGNOSIS — Z950 Presence of cardiac pacemaker: Secondary | ICD-10-CM | POA: Diagnosis not present

## 2018-01-16 DIAGNOSIS — Z9181 History of falling: Secondary | ICD-10-CM | POA: Diagnosis not present

## 2018-01-16 DIAGNOSIS — R9431 Abnormal electrocardiogram [ECG] [EKG]: Secondary | ICD-10-CM | POA: Diagnosis not present

## 2018-01-16 DIAGNOSIS — R0602 Shortness of breath: Secondary | ICD-10-CM | POA: Diagnosis not present

## 2018-01-16 DIAGNOSIS — Z8249 Family history of ischemic heart disease and other diseases of the circulatory system: Secondary | ICD-10-CM | POA: Diagnosis not present

## 2018-01-16 DIAGNOSIS — M542 Cervicalgia: Secondary | ICD-10-CM | POA: Diagnosis not present

## 2018-01-16 DIAGNOSIS — I110 Hypertensive heart disease with heart failure: Secondary | ICD-10-CM | POA: Diagnosis present

## 2018-01-16 DIAGNOSIS — Z888 Allergy status to other drugs, medicaments and biological substances status: Secondary | ICD-10-CM | POA: Diagnosis not present

## 2018-01-16 DIAGNOSIS — S065X9A Traumatic subdural hemorrhage with loss of consciousness of unspecified duration, initial encounter: Secondary | ICD-10-CM | POA: Diagnosis present

## 2018-01-16 DIAGNOSIS — Z803 Family history of malignant neoplasm of breast: Secondary | ICD-10-CM | POA: Diagnosis not present

## 2018-01-16 DIAGNOSIS — J449 Chronic obstructive pulmonary disease, unspecified: Secondary | ICD-10-CM | POA: Diagnosis not present

## 2018-01-16 DIAGNOSIS — R102 Pelvic and perineal pain: Secondary | ICD-10-CM | POA: Diagnosis not present

## 2018-01-16 DIAGNOSIS — R55 Syncope and collapse: Secondary | ICD-10-CM | POA: Diagnosis not present

## 2018-01-16 DIAGNOSIS — Z7982 Long term (current) use of aspirin: Secondary | ICD-10-CM

## 2018-01-16 DIAGNOSIS — Z885 Allergy status to narcotic agent status: Secondary | ICD-10-CM | POA: Diagnosis not present

## 2018-01-16 DIAGNOSIS — Z9842 Cataract extraction status, left eye: Secondary | ICD-10-CM | POA: Diagnosis not present

## 2018-01-16 DIAGNOSIS — J9621 Acute and chronic respiratory failure with hypoxia: Secondary | ICD-10-CM | POA: Diagnosis present

## 2018-01-16 DIAGNOSIS — R0902 Hypoxemia: Secondary | ICD-10-CM | POA: Diagnosis not present

## 2018-01-16 DIAGNOSIS — I959 Hypotension, unspecified: Secondary | ICD-10-CM | POA: Diagnosis not present

## 2018-01-16 LAB — CBC WITH DIFFERENTIAL/PLATELET
Abs Immature Granulocytes: 0.06 10*3/uL (ref 0.00–0.07)
Abs Immature Granulocytes: 0.03 10*3/uL (ref 0.00–0.07)
BASOS PCT: 1 %
Basophils Absolute: 0.1 10*3/uL (ref 0.0–0.1)
Basophils Absolute: 0 10*3/uL (ref 0.0–0.1)
Basophils Relative: 0 %
EOS ABS: 0.1 10*3/uL (ref 0.0–0.5)
EOS ABS: 0 10*3/uL (ref 0.0–0.5)
Eosinophils Relative: 1 %
Eosinophils Relative: 0 %
HCT: 40.1 % (ref 36.0–46.0)
HCT: 40.6 % (ref 36.0–46.0)
Hemoglobin: 13 g/dL (ref 12.0–15.0)
Hemoglobin: 13.4 g/dL (ref 12.0–15.0)
IMMATURE GRANULOCYTES: 0 %
Immature Granulocytes: 1 %
LYMPHS ABS: 0.2 10*3/uL — AB (ref 0.7–4.0)
Lymphocytes Relative: 10 %
Lymphocytes Relative: 3 %
Lymphs Abs: 1.1 10*3/uL (ref 0.7–4.0)
MCH: 31.6 pg (ref 26.0–34.0)
MCH: 31.8 pg (ref 26.0–34.0)
MCHC: 32.4 g/dL (ref 30.0–36.0)
MCHC: 33 g/dL (ref 30.0–36.0)
MCV: 97.3 fL (ref 80.0–100.0)
MCV: 96.4 fL (ref 80.0–100.0)
MONO ABS: 1 10*3/uL (ref 0.1–1.0)
MONOS PCT: 2 %
Monocytes Absolute: 0.2 10*3/uL (ref 0.1–1.0)
Monocytes Relative: 10 %
NEUTROS PCT: 95 %
Neutro Abs: 7.8 10*3/uL — ABNORMAL HIGH (ref 1.7–7.7)
Neutro Abs: 6.4 10*3/uL (ref 1.7–7.7)
Neutrophils Relative %: 77 %
PLATELETS: 166 10*3/uL (ref 150–400)
PLATELETS: 163 10*3/uL (ref 150–400)
RBC: 4.12 MIL/uL (ref 3.87–5.11)
RBC: 4.21 MIL/uL (ref 3.87–5.11)
RDW: 12.6 % (ref 11.5–15.5)
RDW: 12.6 % (ref 11.5–15.5)
WBC: 10.2 10*3/uL (ref 4.0–10.5)
WBC: 6.8 10*3/uL (ref 4.0–10.5)
nRBC: 0 % (ref 0.0–0.2)
nRBC: 0 % (ref 0.0–0.2)

## 2018-01-16 LAB — COMPREHENSIVE METABOLIC PANEL
ALK PHOS: 66 U/L (ref 38–126)
ALT: 18 U/L (ref 0–44)
ALT: 20 U/L (ref 0–44)
AST: 22 U/L (ref 15–41)
AST: 22 U/L (ref 15–41)
Albumin: 3.6 g/dL (ref 3.5–5.0)
Albumin: 3.5 g/dL (ref 3.5–5.0)
Alkaline Phosphatase: 70 U/L (ref 38–126)
Anion gap: 13 (ref 5–15)
Anion gap: 7 (ref 5–15)
BUN: 14 mg/dL (ref 8–23)
BUN: 12 mg/dL (ref 8–23)
CALCIUM: 8.9 mg/dL (ref 8.9–10.3)
CALCIUM: 8.9 mg/dL (ref 8.9–10.3)
CHLORIDE: 94 mmol/L — AB (ref 98–111)
CO2: 29 mmol/L (ref 22–32)
CO2: 29 mmol/L (ref 22–32)
CREATININE: 0.9 mg/dL (ref 0.44–1.00)
Chloride: 100 mmol/L (ref 98–111)
Creatinine, Ser: 0.85 mg/dL (ref 0.44–1.00)
GFR calc Af Amer: >60 mL/min (ref >60)
GFR calc non Af Amer: 57 mL/min — ABNORMAL LOW (ref >60)
GFR, EST NON AFRICAN AMERICAN: 53 mL/min — AB (ref >60)
GLUCOSE: 134 mg/dL — AB (ref 70–99)
Glucose, Bld: 129 mg/dL — ABNORMAL HIGH (ref 70–99)
Potassium: 3.7 mmol/L (ref 3.5–5.1)
Potassium: 4.3 mmol/L (ref 3.5–5.1)
SODIUM: 136 mmol/L (ref 135–145)
Sodium: 136 mmol/L (ref 135–145)
TOTAL PROTEIN: 6.1 g/dL — AB (ref 6.5–8.1)
Total Bilirubin: 1.1 mg/dL (ref 0.3–1.2)
Total Bilirubin: 1.2 mg/dL (ref 0.3–1.2)
Total Protein: 6.5 g/dL (ref 6.5–8.1)

## 2018-01-16 LAB — URINALYSIS, COMPLETE (UACMP) WITH MICROSCOPIC
Bacteria, UA: NONE SEEN
Bilirubin Urine: NEGATIVE
GLUCOSE, UA: NEGATIVE mg/dL
Ketones, ur: 5 mg/dL — AB
NITRITE: NEGATIVE
PH: 5 (ref 5.0–8.0)
Protein, ur: NEGATIVE mg/dL
SPECIFIC GRAVITY, URINE: 1.01 (ref 1.005–1.030)

## 2018-01-16 LAB — TROPONIN I
TROPONIN I: 0.05 ng/mL — AB (ref <0.03)
TROPONIN I: 0.05 ng/mL — AB (ref <0.03)
TROPONIN I: 0.07 ng/mL — AB (ref <0.03)

## 2018-01-16 LAB — CBG MONITORING, ED: GLUCOSE-CAPILLARY: 111 mg/dL — AB (ref 70–99)

## 2018-01-16 LAB — I-STAT TROPONIN, ED: Troponin i, poc: 0.04 ng/mL (ref 0.00–0.08)

## 2018-01-16 LAB — PROTIME-INR
INR: 1.09
PROTHROMBIN TIME: 14 s (ref 11.4–15.2)

## 2018-01-16 LAB — BRAIN NATRIURETIC PEPTIDE: B Natriuretic Peptide: 124.6 pg/mL — ABNORMAL HIGH (ref 0.0–100.0)

## 2018-01-16 MED ORDER — HYDRALAZINE HCL 20 MG/ML IJ SOLN: 10.0000 mg | INTRAMUSCULAR | Status: DC | PRN

## 2018-01-16 MED ORDER — VITAMIN B-12 1000 MCG PO TABS
1000.0000 ug | ORAL_TABLET | Freq: Every day | ORAL | Status: DC
  Administered 2018-01-16 – 2018-01-18 (×3): 1000 ug via ORAL
  Filled 2018-01-16 (×3): qty 1

## 2018-01-16 MED ORDER — ALBUTEROL SULFATE HFA 108 (90 BASE) MCG/ACT IN AERS: 2.0000 | INHALATION_SPRAY | Freq: Four times a day (QID) | RESPIRATORY_TRACT | Status: DC | PRN

## 2018-01-16 MED ORDER — LEVALBUTEROL HCL 0.63 MG/3ML IN NEBU
0.6300 mg | INHALATION_SOLUTION | RESPIRATORY_TRACT | Status: DC | PRN
  Administered 2018-01-17 – 2018-01-18 (×4): 0.63 mg via RESPIRATORY_TRACT
  Filled 2018-01-16 (×5): qty 3

## 2018-01-16 MED ORDER — ALPRAZOLAM 0.5 MG PO TABS
0.5000 mg | ORAL_TABLET | Freq: Every evening | ORAL | Status: DC | PRN
  Administered 2018-01-16 – 2018-01-17 (×2): 0.5 mg via ORAL
  Filled 2018-01-16 (×2): qty 1

## 2018-01-16 MED ORDER — CALCIUM CARBONATE 1250 (500 CA) MG PO TABS: 1250.0000 mg | ORAL_TABLET | Freq: Every day | ORAL | Status: DC

## 2018-01-16 MED ORDER — ONDANSETRON HCL 4 MG/2ML IJ SOLN: 4.0000 mg | Freq: Four times a day (QID) | INTRAMUSCULAR | Status: DC | PRN

## 2018-01-16 MED ORDER — PANTOPRAZOLE SODIUM 40 MG PO TBEC
40.0000 mg | DELAYED_RELEASE_TABLET | Freq: Every day | ORAL | Status: DC
  Administered 2018-01-17 – 2018-01-18 (×2): 40 mg via ORAL
  Filled 2018-01-16: qty 1
  Filled 2018-01-16: qty 2

## 2018-01-16 MED ORDER — CALCIUM CARBONATE 1250 (500 CA) MG PO TABS
1250.0000 mg | ORAL_TABLET | Freq: Every day | ORAL | Status: DC
  Administered 2018-01-16 – 2018-01-18 (×2): 1250 mg via ORAL
  Filled 2018-01-16 (×3): qty 1

## 2018-01-16 MED ORDER — VITAMIN D 1000 UNITS PO TABS
1000.0000 [IU] | ORAL_TABLET | Freq: Every day | ORAL | Status: DC
  Administered 2018-01-17 – 2018-01-18 (×2): 1000 [IU] via ORAL
  Filled 2018-01-16 (×2): qty 1

## 2018-01-16 MED ORDER — POLYVINYL ALCOHOL 1.4 % OP SOLN: 1.0000 [drp] | Freq: Two times a day (BID) | OPHTHALMIC | Status: DC | PRN

## 2018-01-16 MED ORDER — MAGNESIUM OXIDE 400 (241.3 MG) MG PO TABS
400.0000 mg | ORAL_TABLET | Freq: Every day | ORAL | Status: DC
  Administered 2018-01-16 – 2018-01-18 (×3): 400 mg via ORAL
  Filled 2018-01-16 (×3): qty 1

## 2018-01-16 MED ORDER — ONDANSETRON HCL 4 MG PO TABS: 4.0000 mg | ORAL_TABLET | Freq: Four times a day (QID) | ORAL | Status: DC | PRN

## 2018-01-16 MED ORDER — PROSIGHT PO TABS
1.0000 | ORAL_TABLET | Freq: Every day | ORAL | Status: DC
  Administered 2018-01-16 – 2018-01-18 (×3): 1 via ORAL
  Filled 2018-01-16 (×3): qty 1

## 2018-01-16 MED ORDER — LEVALBUTEROL HCL 0.63 MG/3ML IN NEBU
0.6300 mg | INHALATION_SOLUTION | Freq: Four times a day (QID) | RESPIRATORY_TRACT | Status: DC | PRN
  Administered 2018-01-16: 0.63 mg via RESPIRATORY_TRACT
  Filled 2018-01-16: qty 3

## 2018-01-16 NOTE — ED Notes (Signed)
Pt await ready bed

## 2018-01-16 NOTE — H&P (Addendum)
History and Physical    Jamie [name]:[id number] DOB: 1922/04/28 DOA: 01/16/2018  PCP: Crist Infante, MD   Patient coming from: Home.  Chief Complaint: Right-sided weakness.  Fall.  HPI: Jamie [name] is a 84 y.o. female with history of COPD, chronic combined systolic and diastolic CHF, hypertension was recently admitted for chest pain was brought to the ER after patient was found to have right-sided weakness after a fall.  Patient does not recall exactly the incident happening but states that she probably had a fall last evening while she was in the kitchen and patient was at the second floor and does not know how she reached there but had weakness of the right upper and lower extremity and also had some blood coming from her face.  As per the patient's granddaughter 1 of her shoes was in the kitchen and the second 1 was on the second floor.  Patient herself called the EMS.  Patient was found to be wheezing at the time.  ED Course: In the ER patient was initially placed on BiPAP for shortness of breath which soon improved.  CT of the head shows left-sided subdural hematoma and on-call neurosurgeon Dr. Vertell Limber was consulted.  Patient on exam has right upper and lower extremity weakness with strength in the right upper extremities around 3 x 5 in the right lower extremities around 2 x 5.  No facial asymmetry.  Patient is being admitted for further management of subdural hematoma.  Patient's breathing is improved at this time.  Patient states she has chronic indigestion symptoms just like the one she was recently admitted.  Review of Systems: As per HPI, rest all negative.   Past Medical History:  Diagnosis Date  . Asthma   . Carotid stenosis    a. Carotid US (9/15):  Bilateral ICA 1-39%  . Chronic combined systolic and diastolic CHF (congestive heart failure) ([place])    a. EF 40-45% in 11/2013, improved to 55-60% in 05/2016.  Marland Kitchen Complete AV block ([place])   . COPD (chronic obstructive pulmonary  disease) ([place])   . Family history of anesthesia complication    SISTER HAS DIFFICULTY WAKING  . GERD (gastroesophageal reflux disease)   . Hypertension   . Ischemic cardiomyopathy    a.  Echo (9/15):  EF 40-45%, ant and apical and inferoapical HK, Gr 1 DD, trivial MR, mild TR, PASP 23 mmHg;  b. Myoview (9/15) large scar in mid LAD territory, no peri-infarct ischemia, EF 60%, apical AK; intermediate risk >> patient opted for med Rx. b. EF 55-60% by echo 05/2016.  . Orthostasis   . Osteoarthritis   . Osteopenia   . Pacemaker   . Pneumonia   . Pre-syncope   . Presence of permanent cardiac pacemaker   . Shortness of breath   . Tachycardia   . TIA (transient ischemic attack)    a. possible TIA 11/2013.  Marland Kitchen UTI (urinary tract infection)     Past Surgical History:  Procedure Laterality Date  . CHOLECYSTECTOMY    . EYE SURGERY  10/2004  . HERNIA REPAIR  11/2002  . PACEMAKER PLACEMENT     [employer]     reports that she quit smoking about 30 years ago. Her smoking use included cigarettes. She has a 35.00 pack-year smoking history. She has never used smokeless tobacco. She reports that she does not drink alcohol or use drugs.  Allergies  Allergen Reactions  . Ace Inhibitors Cough  . Hydrocodone-Acetaminophen Other (See Comments)  GI distress    Family History  Problem Relation Age of Onset  . Heart disease Sister   . Breast cancer Sister     Prior to Admission medications   Medication Sig Start Date End Date Taking? Authorizing Provider  albuterol (PROAIR HFA) 108 (90 BASE) MCG/ACT inhaler Inhale 2 puffs into the lungs every 6 (six) hours as needed for wheezing or shortness of breath.    Yes [provider]  ALPRAZolam Duanne Moron) 0.5 MG tablet Take 0.5 mg by mouth at bedtime as needed for sleep.    Yes [provider]  aspirin EC 81 MG tablet Take 81 mg by mouth daily.    Yes [provider]  beta carotene w/minerals (OCUVITE) tablet Take 1 tablet by mouth  daily.   Yes [provider]  Calcium Carbonate (CALTRATE 600) 1500 MG TABS Take 600 mg by mouth daily.    Yes [provider]  cholecalciferol (VITAMIN D) 1000 UNITS tablet Take 1,000 Units by mouth daily.     Yes [provider]  Coenzyme Q10 (CO Q 10 PO) Take 1 capsule by mouth daily.   Yes [provider]  cyanocobalamin 1000 MCG tablet Take 1,000 mcg by mouth daily.    Yes [provider]  furosemide (LASIX) 20 MG tablet Take 1 tablet (20 mg total) by mouth daily. 01/09/18  Yes Eulogio Bear U, DO  isosorbide mononitrate (IMDUR) 30 MG 24 hr tablet Take 1 tablet (30 mg total) by mouth daily. Please keep upcoming appt in December with Dr. Caryl Comes for future refills. Thank you Patient taking differently: Take 30 mg by mouth daily.  01/04/18  Yes Deboraha Sprang, MD  levalbuterol Penne Lash) 0.63 MG/3ML nebulizer solution Take 0.63 mg by nebulization every 6 (six) hours as needed for shortness of breath. 07/25/12  Yes Rai, Ripudeep K, MD  losartan (COZAAR) 25 MG tablet Take 1 tablet (25 mg total) by mouth daily. Please keep upcoming appt for future refills. Thank you 01/10/18  Yes Richardson Dopp T, PA-C  Magnesium 250 MG TABS Take 250 mg by mouth daily.    Yes [provider]  nitroGLYCERIN (NITROSTAT) 0.4 MG SL tablet Place 1 tablet (0.4 mg total) under the tongue every 5 (five) minutes as needed for chest pain. 11/28/13  Yes Charlynne Cousins, MD  pantoprazole (PROTONIX) 40 MG tablet Take 40 mg by mouth daily. 01/04/18  Yes [provider]  Polyethyl Glycol-Propyl Glycol (SYSTANE) 0.4-0.3 % SOLN Place 1 drop into both eyes 2 (two) times daily as needed (dry eyes).   Yes [provider]  potassium chloride (K-DUR) 10 MEQ tablet Take 10 mEq by mouth 2 (two) times a week. Tuesday and Friday   Yes [provider]    Physical Exam: Vitals:   01/16/18 0230 01/16/18 0245 01/16/18 0300 01/16/18 0315  BP: (!) 132/52 (!) 133/56  (!) 144/62 (!) 148/75  Pulse: 75 79 84 86  Resp: (!) 21 19 18 19   Temp:      TempSrc:      SpO2: 98% 99% 95% 97%      Constitutional: Moderately built and nourished. Vitals:   01/16/18 0230 01/16/18 0245 01/16/18 0300 01/16/18 0315  BP: (!) 132/52 (!) 133/56 (!) 144/62 (!) 148/75  Pulse: 75 79 84 86  Resp: (!) 21 19 18 19   Temp:      TempSrc:      SpO2: 98% 99% 95% 97%   Eyes: Anicteric no pallor. ENMT: No discharge  from the ears eyes nose or mouth. Neck: No mass or.  No neck rigidity but no JVD appreciated. Respiratory: No rhonchi or crepitations. Cardiovascular: S1-S2 heard no murmurs appreciated. Abdomen: Soft nontender bowel sounds present. Musculoskeletal: No edema.  No joint effusion. Skin: No rash.  Had skin tears on the face. Neurologic: Alert awake oriented to time place and person.  Right upper extremity is 3 x 5 in strength in right lower extremities 2 x 5 in strength.  Pupils equal reacting to light. Psychiatric: Appears normal.   Labs on Admission: I have personally reviewed following labs and imaging studies  CBC: Recent Labs  Lab 01/16/18 0211  WBC 10.2  NEUTROABS 7.8*  HGB 13.0  HCT 40.1  MCV 97.3  PLT 353   Basic Metabolic Panel: Recent Labs  Lab 01/16/18 0211  NA 136  K 3.7  CL 94*  CO2 29  GLUCOSE 129*  BUN 14  CREATININE 0.90  CALCIUM 8.9   GFR: Estimated Creatinine Clearance: 28.8 mL/min (by C-G formula based on SCr of 0.9 mg/dL). Liver Function Tests: Recent Labs  Lab 01/16/18 0211  AST 22  ALT 18  ALKPHOS 70  BILITOT 1.1  PROT 6.1*  ALBUMIN 3.6   No results for input(s): LIPASE, AMYLASE in the last 168 hours. No results for input(s): AMMONIA in the last 168 hours. Coagulation Profile: No results for input(s): INR, PROTIME in the last 168 hours. Cardiac Enzymes: No results for input(s): CKTOTAL, CKMB, CKMBINDEX, TROPONINI in the last 168 hours. BNP (last 3 results) No results for input(s): PROBNP in the last 8760  hours. HbA1C: No results for input(s): HGBA1C in the last 72 hours. CBG: No results for input(s): GLUCAP in the last 168 hours. Lipid Profile: No results for input(s): CHOL, HDL, LDLCALC, TRIG, CHOLHDL, LDLDIRECT in the last 72 hours. Thyroid Function Tests: No results for input(s): TSH, T4TOTAL, FREET4, T3FREE, THYROIDAB in the last 72 hours. Anemia Panel: No results for input(s): VITAMINB12, FOLATE, FERRITIN, TIBC, IRON, RETICCTPCT in the last 72 hours. Urine analysis:    Component Value Date/Time   COLORURINE YELLOW 01/16/2018 0212   APPEARANCEUR CLEAR 01/16/2018 0212   LABSPEC 1.010 01/16/2018 0212   PHURINE 5.0 01/16/2018 0212   GLUCOSEU NEGATIVE 01/16/2018 0212   HGBUR LARGE (A) 01/16/2018 0212   BILIRUBINUR NEGATIVE 01/16/2018 0212   KETONESUR 5 (A) 01/16/2018 0212   PROTEINUR NEGATIVE 01/16/2018 0212   UROBILINOGEN 0.2 08/04/2014 2211   NITRITE NEGATIVE 01/16/2018 0212   LEUKOCYTESUR SMALL (A) 01/16/2018 0212   Sepsis Labs: @LABRCNTIP (procalcitonin:4,lacticidven:4) )No results found for this or any previous visit (from the past 240 hour(s)).   Radiological Exams on Admission: Ct Head Wo Contrast  Result Date: 01/16/2018 CLINICAL DATA:  Fall. Initial encounter. EXAM: CT HEAD WITHOUT CONTRAST CT CERVICAL SPINE WITHOUT CONTRAST TECHNIQUE: Multidetector CT imaging of the head and cervical spine was performed following the standard protocol without intravenous contrast. Multiplanar CT image reconstructions of the cervical spine were also generated. COMPARISON:  Head CT 08/05/2014. Cervical spine radiographs 08/08/2003. FINDINGS: CT HEAD FINDINGS Brain: A variably hyperattenuating subdural hematoma extending diffusely over the left cerebral convexity measures up to 11 mm in thickness. There is slight left cerebral hemispheric sulcal effacement without midline shift. There is no evidence of acute infarct. Mild cerebral atrophy is noted. Vascular: Calcified atherosclerosis at the  skull base. No hyperdense vessel. Skull: No fracture or focal osseous lesion. Sinuses/Orbits: Paranasal sinuses and mastoid air cells are clear. Bilateral cataract extraction. Other: None. CT CERVICAL  SPINE FINDINGS Alignment: Slight reversal of the normal cervical lordosis. Chronic grade 1 anterolisthesis of C4 on C5. Skull base and vertebrae: No acute fracture or destructive osseous lesion. Asymmetric, advanced left-sided C1-2 arthropathy with small erosions. Soft tissues and spinal canal: No prevertebral fluid or swelling. No visible canal hematoma. Disc levels: Ankylosis across the C5-6 disc space and posterior elements. Moderate to severe disc space narrowing at C3-4, C4-5, and C6-7. Moderate multilevel facet arthrosis. Moderate left neural foraminal stenosis at C3-4. Upper chest: Mild biapical pleuroparenchymal scarring. Centrilobular emphysema. Other: Diffuse thyroid heterogeneity with multiple small nodules of varying attenuation and calcification. Mild calcific carotid artery atherosclerosis. IMPRESSION: 1. Acute left subdural hematoma measuring 11 mm in thickness. No midline shift. 2. No evidence of acute cervical spine fracture or traumatic subluxation. Critical Value/emergent results were called by telephone at the time of interpretation on 01/16/2018 at 2:56 am to Dr. Delora Fuel, who verbally acknowledged these results. Electronically Signed   By: Logan Bores M.D.   On: 01/16/2018 02:58   Ct Cervical Spine Wo Contrast  Result Date: 01/16/2018 CLINICAL DATA:  Fall. Initial encounter. EXAM: CT HEAD WITHOUT CONTRAST CT CERVICAL SPINE WITHOUT CONTRAST TECHNIQUE: Multidetector CT imaging of the head and cervical spine was performed following the standard protocol without intravenous contrast. Multiplanar CT image reconstructions of the cervical spine were also generated. COMPARISON:  Head CT 08/05/2014. Cervical spine radiographs 08/08/2003. FINDINGS: CT HEAD FINDINGS Brain: A variably hyperattenuating  subdural hematoma extending diffusely over the left cerebral convexity measures up to 11 mm in thickness. There is slight left cerebral hemispheric sulcal effacement without midline shift. There is no evidence of acute infarct. Mild cerebral atrophy is noted. Vascular: Calcified atherosclerosis at the skull base. No hyperdense vessel. Skull: No fracture or focal osseous lesion. Sinuses/Orbits: Paranasal sinuses and mastoid air cells are clear. Bilateral cataract extraction. Other: None. CT CERVICAL SPINE FINDINGS Alignment: Slight reversal of the normal cervical lordosis. Chronic grade 1 anterolisthesis of C4 on C5. Skull base and vertebrae: No acute fracture or destructive osseous lesion. Asymmetric, advanced left-sided C1-2 arthropathy with small erosions. Soft tissues and spinal canal: No prevertebral fluid or swelling. No visible canal hematoma. Disc levels: Ankylosis across the C5-6 disc space and posterior elements. Moderate to severe disc space narrowing at C3-4, C4-5, and C6-7. Moderate multilevel facet arthrosis. Moderate left neural foraminal stenosis at C3-4. Upper chest: Mild biapical pleuroparenchymal scarring. Centrilobular emphysema. Other: Diffuse thyroid heterogeneity with multiple small nodules of varying attenuation and calcification. Mild calcific carotid artery atherosclerosis. IMPRESSION: 1. Acute left subdural hematoma measuring 11 mm in thickness. No midline shift. 2. No evidence of acute cervical spine fracture or traumatic subluxation. Critical Value/emergent results were called by telephone at the time of interpretation on 01/16/2018 at 2:56 am to Dr. Delora Fuel, who verbally acknowledged these results. Electronically Signed   By: Logan Bores M.D.   On: 01/16/2018 02:58   Dg Chest Port 1 View  Result Date: 01/16/2018 CLINICAL DATA:  Shortness of breath.  Nose bleed. EXAM: PORTABLE CHEST 1 VIEW COMPARISON:  01/08/2018 FINDINGS: The lungs are hyperinflated with diffuse interstitial  prominence. No focal airspace consolidation or pulmonary edema. No pleural effusion or pneumothorax. Normal cardiomediastinal contours. Left chest wall pacemaker with unchanged position of leads. IMPRESSION: COPD without acute airspace disease. Electronically Signed   By: Ulyses Jarred M.D.   On: 01/16/2018 02:25    EKG: Independently reviewed.  Normal sinus rhythm.  Assessment/Plan Principal Problem:   Subdural hematoma (HCC) Active Problems:  AV BLOCK, COMPLETE   COPD (chronic obstructive pulmonary disease) with emphysema (HCC)   Pacemaker-[employer]   Chronic combined systolic and diastolic heart failure, NYHA class 2 (HCC)   Ischemic cardiomyopathy    1. Subdural hematoma -Dr. Vertell Limber on-call neurosurgeon to see patient until then patient will be kept n.p.o. in anticipation of possible procedure.  Patient will be placed on neurochecks and will be admitted to stepdown. Xray pelvis pending. 2. Acute respiratory failure with hypoxia likely from COPD exacerbation has improved after patient was placed on BiPAP and nebulizer treatment.  For now patient is on PRN Xopenex. 3. History of chronic combined systolic and diastolic CHF -appears compensated.  Closely observe.  Usually takes Lasix.  Presently n.p.o. 4. Hypertension we will keep patient on PRN IV hydralazine for now. 5. History of pacemaker placement. 6. History of GERD with presently having some discomfort in the chest retrosternal we will cycle cardiac markers.   DVT prophylaxis: SCDs. Code Status: DNR. Family Communication: Patient's granddaughter. Disposition Plan: To be determined. Consults called: Dr. Vertell Limber. Admission status: Inpatient.   Rise Patience MD [name] Pager [phone].  If 7PM-7AM, please contact night-coverage [email or web address] Password [email or web address]  01/16/2018, 5:15 AM

## 2018-01-16 NOTE — ED Notes (Signed)
Neuro RN into see pt

## 2018-01-16 NOTE — Progress Notes (Signed)
Patient arrived to room ED from ED.  Assessment complete, VS obtained, and Admission database began.

## 2018-01-16 NOTE — Progress Notes (Signed)
RT placed pt on BIPAP upon arrival via EMS. Pt sats are at 100% currently. Pt settings 10/5, FIO2 30%, SpO2 100%/ RT will continue to monitor.

## 2018-01-16 NOTE — ED Triage Notes (Signed)
Coming from home by ems due to sob. Ems was called out for nose bleed, pt started to have trouble breathing and talking. Pt has rales and wheezing and was breathing 30 breaths a minute pt is on oxygen at home which she was stating at 91% on 2L, pt was then put on CPAP and given 10mg  of albuterol, 0.5mg  of atrovent, 125 solumedrol and 2g of mag.

## 2018-01-16 NOTE — ED Provider Notes (Signed)
[facility] Provider Note   CSN: [id number] Arrival date & time: 01/16/18  0141     History   Chief Complaint Chief Complaint  Patient presents with  . Respiratory Distress    HPI Jamie [name] is a 84 y.o. female.  Patient to ED by EMS who were called by the patient. She reports she woke up from sleep panicked because she could not move her right arm or leg. She remembers she was sitting on the edge of her bed, still wearing her clothes from yesterday, had had urinary incontinence and saw blood around her. She does not remember much more than this. She currently has no difficulty in moving all extremities. Granddaughter is at bedside who assists with history. There is evidence she fell while in the kitchen while getting her dinner last evening (patient remembers getting something out of the fridge that was sitting on the counter, per family). There was blood on the floor. She got herself back to her bedroom upstairs and woke later to call EMS. Timing unknown. Per EMS report, the patient was SOB and wheezing on their arrival, O2 sat reported as 91%. She was given abluterol, Solumedrol, magnesium and started on CPAP. She has a history of COPD, on chronic home O2 at 2 liters x 24 hrs/day. Not on anticoagulation. She has no complaints of SOB on arrival.   The history is provided by the patient and a relative. No language interpreter was used.    Past Medical History:  Diagnosis Date  . Asthma   . Carotid stenosis    a. Carotid US (9/15):  Bilateral ICA 1-39%  . Chronic combined systolic and diastolic CHF (congestive heart failure) ([place])    a. EF 40-45% in 11/2013, improved to 55-60% in 05/2016.  Marland Kitchen Complete AV block ([place])   . COPD (chronic obstructive pulmonary disease) ([place])   . Family history of anesthesia complication    SISTER HAS DIFFICULTY WAKING  . GERD (gastroesophageal reflux disease)   . Hypertension   . Ischemic cardiomyopathy    a.   Echo (9/15):  EF 40-45%, ant and apical and inferoapical HK, Gr 1 DD, trivial MR, mild TR, PASP 23 mmHg;  b. Myoview (9/15) large scar in mid LAD territory, no peri-infarct ischemia, EF 60%, apical AK; intermediate risk >> patient opted for med Rx. b. EF 55-60% by echo 05/2016.  . Orthostasis   . Osteoarthritis   . Osteopenia   . Pacemaker   . Pneumonia   . Pre-syncope   . Presence of permanent cardiac pacemaker   . Shortness of breath   . Tachycardia   . TIA (transient ischemic attack)    a. possible TIA 11/2013.  Marland Kitchen UTI (urinary tract infection)     Patient Active Problem List   Diagnosis Date Noted  . GERD (gastroesophageal reflux disease) 01/09/2018  . CAD (coronary artery disease) 01/08/2018  . Chest pain 05/19/2016  . Chronic respiratory failure with hypoxia, on home O2 therapy ([place]) 05/19/2016  . History of LAD infarction 05/19/2016  . Fever 05/19/2016  . Chronic combined systolic and diastolic heart failure, NYHA class 2 ([place]) 05/19/2016  . Ischemic cardiomyopathy 05/19/2016  . Dyslipidemia   . Blurred vision 08/05/2014  . Anginal pain ([place]) 01/22/2014  . Elevated troponin 11/26/2013  . Acute diastolic congestive heart failure ([place]) 07/25/2012  . Elevated brain natriuretic peptide (BNP) level 07/21/2012  . Orthostatic hypotension 08/16/2011  . Non-STEMI  possibly type II 11/17/2010  .  VENTRICULAR TACHYCARDIA 09/29/2009  . HTN (hypertension) 06/06/2008  . AV BLOCK, COMPLETE 06/06/2008  . COPD (chronic obstructive pulmonary disease) with emphysema ([place]) 06/06/2008  . OSTEOARTHRITIS 06/06/2008  . OSTEOPENIA 06/06/2008  . TACHYCARDIA 06/06/2008  . Pacemaker-Medtronic 06/06/2008    Past Surgical History:  Procedure Laterality Date  . CHOLECYSTECTOMY    . EYE SURGERY  10/2004  . HERNIA REPAIR  11/2002  . PACEMAKER PLACEMENT     Medtronic     OB History   None      Home Medications    Prior to Admission medications   Medication Sig Start Date End Date Taking?  Authorizing Provider  albuterol (PROAIR HFA) 108 (90 BASE) MCG/ACT inhaler Inhale 2 puffs into the lungs every 6 (six) hours as needed for wheezing or shortness of breath.     [provider]  ALPRAZolam Duanne Moron) 0.5 MG tablet Take 0.5 mg by mouth at bedtime as needed for sleep.     [provider]  aspirin EC 81 MG tablet Take 81 mg by mouth daily.     [provider]  beta carotene w/minerals (OCUVITE) tablet Take 1 tablet by mouth daily.    [provider]  Calcium Carbonate (CALTRATE 600) 1500 MG TABS Take 600 mg by mouth daily.     [provider]  cholecalciferol (VITAMIN D) 1000 UNITS tablet Take 1,000 Units by mouth daily.      [provider]  Coenzyme Q10 (CO Q 10 PO) Take 1 capsule by mouth daily.    [provider]  cyanocobalamin 1000 MCG tablet Take 1,000 mcg by mouth daily.     [provider]  furosemide (LASIX) 20 MG tablet Take 1 tablet (20 mg total) by mouth daily. 01/09/18   Geradine Girt, DO  isosorbide mononitrate (IMDUR) 30 MG 24 hr tablet Take 1 tablet (30 mg total) by mouth daily. Please keep upcoming appt in December with Dr. Caryl Comes for future refills. Thank you Patient taking differently: Take 30 mg by mouth daily.  01/04/18   Deboraha Sprang, MD  levalbuterol Penne Lash) 0.63 MG/3ML nebulizer solution Take 0.63 mg by nebulization every 6 (six) hours as needed for shortness of breath. 07/25/12   Rai, Vernelle Emerald, MD  losartan (COZAAR) 25 MG tablet Take 1 tablet (25 mg total) by mouth daily. Please keep upcoming appt for future refills. Thank you 01/10/18   Richardson Dopp T, PA-C  Magnesium 250 MG TABS Take 250 mg by mouth daily.     [provider]  nitroGLYCERIN (NITROSTAT) 0.4 MG SL tablet Place 1 tablet (0.4 mg total) under the tongue every 5 (five) minutes as needed for chest pain. 11/28/13   Charlynne Cousins, MD  pantoprazole (PROTONIX) 40 MG tablet Take 40 mg by mouth daily. 01/04/18    [provider]  Polyethyl Glycol-Propyl Glycol (SYSTANE) 0.4-0.3 % SOLN Place 1 drop into both eyes 2 (two) times daily as needed (dry eyes).    [provider]  potassium chloride (K-DUR) 10 MEQ tablet Take 10 mEq by mouth 2 (two) times a week. Tuesday and Friday    [provider]    Family History Family History  Problem Relation Age of Onset  . Heart disease Sister   . Breast cancer Sister     Social History Social History   Tobacco Use  . Smoking status: Former Smoker    Packs/day: 1.00    Years: 35.00    Pack years: 35.00  Types: Cigarettes    Last attempt to quit: 03/16/1987    Years since quitting: 30.8  . Smokeless tobacco: Never Used  Substance Use Topics  . Alcohol use: No  . Drug use: No     Allergies   Ace inhibitors and Hydrocodone-acetaminophen   Review of Systems Review of Systems  Reason unable to perform ROS: Patient does not have full memory of events.  Constitutional: Negative.  Negative for fever.  HENT: Negative.   Eyes: Negative.  Negative for visual disturbance.  Respiratory: Positive for shortness of breath and wheezing.   Cardiovascular: Negative for chest pain.  Gastrointestinal: Negative for abdominal pain, nausea and vomiting.  Genitourinary: Negative.   Musculoskeletal: Negative.  Negative for back pain and neck pain.  Skin: Positive for color change and wound.  Neurological: Positive for syncope and weakness (See HPI.). Negative for dizziness, speech difficulty and headaches.  Hematological: Does not bruise/bleed easily.  Psychiatric/Behavioral: Negative for confusion.     Physical Exam Updated Vital Signs BP (!) 138/56 (BP Location: Right Arm)   Pulse 77   Temp 98.2 F (36.8 C) (Axillary)   Resp 15   SpO2 100%   Physical Exam  Constitutional: She is oriented to person, place, and time. She appears well-developed and well-nourished.  HENT:  Head: Normocephalic.  Neck: Normal range of motion.  Neck supple.  Cardiovascular: Normal rate and regular rhythm.  Pulmonary/Chest: Effort normal and breath sounds normal. She has no wheezes. She has no rales.  Abdominal: Soft. Bowel sounds are normal. There is no tenderness. There is no rebound and no guarding.  Musculoskeletal: Normal range of motion.  Neurological: She is alert and oriented to person, place, and time. She has normal strength. No sensory deficit. She exhibits normal muscle tone. Coordination normal. GCS eye subscore is 4. GCS verbal subscore is 5. GCS motor subscore is 6.  Fine tremor bilateral UE's. CN's 3-12 intact. Gait not tested.  Skin: Skin is warm and dry. No rash noted.  Psychiatric: She has a normal mood and affect.     ED Treatments / Results  Labs (all labs ordered are listed, but only abnormal results are displayed) Labs Reviewed  CBC WITH DIFFERENTIAL/PLATELET - Abnormal; Notable for the following components:      Result Value   Neutro Abs 7.8 (*)    All other components within normal limits  COMPREHENSIVE METABOLIC PANEL - Abnormal; Notable for the following components:   Chloride 94 (*)    Glucose, Bld 129 (*)    Total Protein 6.1 (*)    GFR calc non Af Amer 53 (*)    All other components within normal limits  URINALYSIS, COMPLETE (UACMP) WITH MICROSCOPIC  BRAIN NATRIURETIC PEPTIDE  I-STAT TROPONIN, ED    EKG EKG Interpretation  Date/Time:  Monday January 16 2018 01:55:16 EST Ventricular Rate:  78 PR Interval:    QRS Duration: 140 QT Interval:  417 QTC Calculation: 475 R Axis:   -94 Text Interpretation:  Atrial-sensed ventricular-paced rhythm No further analysis attempted due to paced rhythm Baseline wander in lead(s) I III aVL When compared with ECG of 01/08/2018, No significant change was found Confirmed by Delora Fuel ([id number]) on 01/16/2018 2:06:01 AM   Radiology Ct Head Wo Contrast  Result Date: 01/16/2018 CLINICAL DATA:  Fall. Initial encounter. EXAM: CT HEAD WITHOUT CONTRAST CT  CERVICAL SPINE WITHOUT CONTRAST TECHNIQUE: Multidetector CT imaging of the head and cervical spine was performed following the standard protocol without intravenous contrast. Multiplanar CT image  reconstructions of the cervical spine were also generated. COMPARISON:  Head CT 08/05/2014. Cervical spine radiographs 08/08/2003. FINDINGS: CT HEAD FINDINGS Brain: A variably hyperattenuating subdural hematoma extending diffusely over the left cerebral convexity measures up to 11 mm in thickness. There is slight left cerebral hemispheric sulcal effacement without midline shift. There is no evidence of acute infarct. Mild cerebral atrophy is noted. Vascular: Calcified atherosclerosis at the skull base. No hyperdense vessel. Skull: No fracture or focal osseous lesion. Sinuses/Orbits: Paranasal sinuses and mastoid air cells are clear. Bilateral cataract extraction. Other: None. CT CERVICAL SPINE FINDINGS Alignment: Slight reversal of the normal cervical lordosis. Chronic grade 1 anterolisthesis of C4 on C5. Skull base and vertebrae: No acute fracture or destructive osseous lesion. Asymmetric, advanced left-sided C1-2 arthropathy with small erosions. Soft tissues and spinal canal: No prevertebral fluid or swelling. No visible canal hematoma. Disc levels: Ankylosis across the C5-6 disc space and posterior elements. Moderate to severe disc space narrowing at C3-4, C4-5, and C6-7. Moderate multilevel facet arthrosis. Moderate left neural foraminal stenosis at C3-4. Upper chest: Mild biapical pleuroparenchymal scarring. Centrilobular emphysema. Other: Diffuse thyroid heterogeneity with multiple small nodules of varying attenuation and calcification. Mild calcific carotid artery atherosclerosis. IMPRESSION: 1. Acute left subdural hematoma measuring 11 mm in thickness. No midline shift. 2. No evidence of acute cervical spine fracture or traumatic subluxation. Critical Value/emergent results were called by telephone at the time of  interpretation on 01/16/2018 at 2:56 am to Dr. Delora Fuel, who verbally acknowledged these results. Electronically Signed   By: Logan Bores M.D.   On: 01/16/2018 02:58   Ct Cervical Spine Wo Contrast  Result Date: 01/16/2018 CLINICAL DATA:  Fall. Initial encounter. EXAM: CT HEAD WITHOUT CONTRAST CT CERVICAL SPINE WITHOUT CONTRAST TECHNIQUE: Multidetector CT imaging of the head and cervical spine was performed following the standard protocol without intravenous contrast. Multiplanar CT image reconstructions of the cervical spine were also generated. COMPARISON:  Head CT 08/05/2014. Cervical spine radiographs 08/08/2003. FINDINGS: CT HEAD FINDINGS Brain: A variably hyperattenuating subdural hematoma extending diffusely over the left cerebral convexity measures up to 11 mm in thickness. There is slight left cerebral hemispheric sulcal effacement without midline shift. There is no evidence of acute infarct. Mild cerebral atrophy is noted. Vascular: Calcified atherosclerosis at the skull base. No hyperdense vessel. Skull: No fracture or focal osseous lesion. Sinuses/Orbits: Paranasal sinuses and mastoid air cells are clear. Bilateral cataract extraction. Other: None. CT CERVICAL SPINE FINDINGS Alignment: Slight reversal of the normal cervical lordosis. Chronic grade 1 anterolisthesis of C4 on C5. Skull base and vertebrae: No acute fracture or destructive osseous lesion. Asymmetric, advanced left-sided C1-2 arthropathy with small erosions. Soft tissues and spinal canal: No prevertebral fluid or swelling. No visible canal hematoma. Disc levels: Ankylosis across the C5-6 disc space and posterior elements. Moderate to severe disc space narrowing at C3-4, C4-5, and C6-7. Moderate multilevel facet arthrosis. Moderate left neural foraminal stenosis at C3-4. Upper chest: Mild biapical pleuroparenchymal scarring. Centrilobular emphysema. Other: Diffuse thyroid heterogeneity with multiple small nodules of varying attenuation  and calcification. Mild calcific carotid artery atherosclerosis. IMPRESSION: 1. Acute left subdural hematoma measuring 11 mm in thickness. No midline shift. 2. No evidence of acute cervical spine fracture or traumatic subluxation. Critical Value/emergent results were called by telephone at the time of interpretation on 01/16/2018 at 2:56 am to Dr. Delora Fuel, who verbally acknowledged these results. Electronically Signed   By: Logan Bores M.D.   On: 01/16/2018 02:58   Dg Chest [place]  1 View  Result Date: 01/16/2018 CLINICAL DATA:  Shortness of breath.  Nose bleed. EXAM: PORTABLE CHEST 1 VIEW COMPARISON:  01/08/2018 FINDINGS: The lungs are hyperinflated with diffuse interstitial prominence. No focal airspace consolidation or pulmonary edema. No pleural effusion or pneumothorax. Normal cardiomediastinal contours. Left chest wall pacemaker with unchanged position of leads. IMPRESSION: COPD without acute airspace disease. Electronically Signed   By: Ulyses Jarred M.D.   On: 01/16/2018 02:25    Procedures Procedures (including critical care time)  Medications Ordered in ED Medications - No data to display   Initial Impression / Assessment and Plan / ED Course  I have reviewed the triage vital signs and the nursing notes.  Pertinent labs & imaging results that were available during my care of the patient were reviewed by me and considered in my medical decision making (see chart for details).     Patient from home after fall, reportedly with questionable right sided paralysis/weakness at home, fall with facial bruising, SOB, wheezing. She cannot remember details of events prior to calling EMS. With granddaughter's information, it appears she fell while in the kitchen and got herself up to her bedroom, later woke after urinary incontinence, perceived right sided weakness and SOB. Called EMS.   The patient arrives to ED and placed on bipap. She is awake, oriented, able to contribute to history. She is  transitioned to 2L via [place] as per her home regimen and is doing well, breathing unlabored, normal O2 saturation of 98%. EKG shows paced rhythm.   CT scan head results show 11 mm left subdural hematoma. No midline shift. Patient is neurologically intact. Neurosurgery paged.   Discussed with Dr. Vertell Limber who states he will see the patient in the morning and reassess. No urgent surgical need and he doubts, based on appearance of CT now that surgery will be required. Will likely repeat the CT tomorrow.   Re-evaluation - the patient continues to be alert and oriented. VSS. Talking with family at bedside.   Discussed admission with Dr. Hal Hope, [facility], who accepts for admission.   Final Clinical Impressions(s) / ED Diagnoses   Final diagnoses:  None   1. Fall 2. SDH 3. COPD   ED Discharge Orders    None       Charlann Lange, Hershal Coria 14/43/15 [id number]    Delora Fuel, MD 67/61/95 [phone]

## 2018-01-16 NOTE — Consult Note (Signed)
Reason for Consult:left subdural hematoma Referring Physician: Shakenya [name] is an 84 y.o. female.  HPI:  Jamie [name] is a 84 y.o. female with history of COPD, chronic combined systolic and diastolic CHF, hypertension was recently admitted for chest pain was brought to the ER after patient was found to have right-sided weakness after a fall.  Patient does not recall exactly the incident happening but states that she probably had a fall last evening while she was in the kitchen and patient was at the second floor and does not know how she reached there but had weakness of the right upper and lower extremity and also had some blood coming from her face.  As per the patient's granddaughter 1 of her shoes was in the kitchen and the second 1 was on the second floor.  Patient herself called the EMS.  Patient was found to be wheezing at the time.  Head CT in ER shows left SDH with 11 mm shift.     Past Medical History:  Diagnosis Date  . Asthma   . Carotid stenosis    a. Carotid US (9/15):  Bilateral ICA 1-39%  . Chronic combined systolic and diastolic CHF (congestive heart failure) ([place])    a. EF 40-45% in 11/2013, improved to 55-60% in 05/2016.  Marland Kitchen Complete AV block ([place])   . COPD (chronic obstructive pulmonary disease) ([place])   . Family history of anesthesia complication    SISTER HAS DIFFICULTY WAKING  . GERD (gastroesophageal reflux disease)   . Hypertension   . Ischemic cardiomyopathy    a.  Echo (9/15):  EF 40-45%, ant and apical and inferoapical HK, Gr 1 DD, trivial MR, mild TR, PASP 23 mmHg;  b. Myoview (9/15) large scar in mid LAD territory, no peri-infarct ischemia, EF 60%, apical AK; intermediate risk >> patient opted for med Rx. b. EF 55-60% by echo 05/2016.  . Orthostasis   . Osteoarthritis   . Osteopenia   . Pacemaker   . Pneumonia   . Pre-syncope   . Presence of permanent cardiac pacemaker   . Shortness of breath   . Tachycardia   . TIA (transient ischemic attack)    a.  possible TIA 11/2013.  Marland Kitchen UTI (urinary tract infection)     Past Surgical History:  Procedure Laterality Date  . CHOLECYSTECTOMY    . EYE SURGERY  10/2004  . HERNIA REPAIR  11/2002  . PACEMAKER PLACEMENT     [employer]    Family History  Problem Relation Age of Onset  . Heart disease Sister   . Breast cancer Sister     Social History:  reports that she quit smoking about 30 years ago. Her smoking use included cigarettes. She has a 35.00 pack-year smoking history. She has never used smokeless tobacco. She reports that she does not drink alcohol or use drugs.  Allergies:  Allergies  Allergen Reactions  . Ace Inhibitors Cough  . Hydrocodone-Acetaminophen Other (See Comments)     GI distress    Medications: I have reviewed the patient's current medications.  Results for orders placed or performed during the hospital encounter of 01/16/18 (from the past 48 hour(s))  Brain natriuretic peptide     Status: Abnormal   Collection Time: 01/16/18  2:10 AM  Result Value Ref Range   B Natriuretic Peptide 124.6 (H) 0.0 - 100.0 pg/mL    Comment: Performed at [facility], [address] [place], [place] [address]  CBC with  Differential     Status: Abnormal   Collection Time: 01/16/18  2:11 AM  Result Value Ref Range   WBC 10.2 4.0 - 10.5 K/uL   RBC 4.12 3.87 - 5.11 MIL/uL   Hemoglobin 13.0 12.0 - 15.0 g/dL   HCT 40.1 36.0 - 46.0 %   MCV 97.3 80.0 - 100.0 fL   MCH 31.6 26.0 - 34.0 pg   MCHC 32.4 30.0 - 36.0 g/dL   RDW 12.6 11.5 - 15.5 %   Platelets 166 150 - 400 K/uL   nRBC 0.0 0.0 - 0.2 %   Neutrophils Relative % 77 %   Neutro Abs 7.8 (H) 1.7 - 7.7 K/uL   Lymphocytes Relative 10 %   Lymphs Abs 1.1 0.7 - 4.0 K/uL   Monocytes Relative 10 %   Monocytes Absolute 1.0 0.1 - 1.0 K/uL   Eosinophils Relative 1 %   Eosinophils Absolute 0.1 0.0 - 0.5 K/uL   Basophils Relative 1 %   Basophils Absolute 0.1 0.0 - 0.1 K/uL   Immature Granulocytes 1 %   Abs Immature Granulocytes 0.06  0.00 - 0.07 K/uL    Comment: Performed at [facility] [address] [place], [place] [address]  Comprehensive metabolic panel     Status: Abnormal   Collection Time: 01/16/18  2:11 AM  Result Value Ref Range   Sodium 136 135 - 145 mmol/L   Potassium 3.7 3.5 - 5.1 mmol/L   Chloride 94 (L) 98 - 111 mmol/L   CO2 29 22 - 32 mmol/L   Glucose, Bld 129 (H) 70 - 99 mg/dL   BUN 14 8 - 23 mg/dL   Creatinine, Ser 0.90 0.44 - 1.00 mg/dL   Calcium 8.9 8.9 - 10.3 mg/dL   Total Protein 6.1 (L) 6.5 - 8.1 g/dL   Albumin 3.6 3.5 - 5.0 g/dL   AST 22 15 - 41 U/L   ALT 18 0 - 44 U/L   Alkaline Phosphatase 70 38 - 126 U/L   Total Bilirubin 1.1 0.3 - 1.2 mg/dL   GFR calc non Af Amer 53 (L) >60 mL/min   GFR calc Af Amer >60 >60 mL/min    Comment: (NOTE) The eGFR has been calculated using the CKD EPI equation. This calculation has not been validated in all clinical situations. eGFR's persistently <60 mL/min signify possible Chronic Kidney Disease.    Anion gap 13 5 - 15    Comment: Performed at [facility] [address] [place], [place] [address]  Urinalysis, Complete w Microscopic     Status: Abnormal   Collection Time: 01/16/18  2:12 AM  Result Value Ref Range   Color, Urine YELLOW YELLOW   APPearance CLEAR CLEAR   Specific Gravity, Urine 1.010 1.005 - 1.030   pH 5.0 5.0 - 8.0   Glucose, UA NEGATIVE NEGATIVE mg/dL   Hgb urine dipstick LARGE (A) NEGATIVE   Bilirubin Urine NEGATIVE NEGATIVE   Ketones, ur 5 (A) NEGATIVE mg/dL   Protein, ur NEGATIVE NEGATIVE mg/dL   Nitrite NEGATIVE NEGATIVE   Leukocytes, UA SMALL (A) NEGATIVE   RBC / HPF 11-20 0 - 5 RBC/hpf   WBC, UA 0-5 0 - 5 WBC/hpf   Bacteria, UA NONE SEEN NONE SEEN   Squamous Epithelial / LPF 0-5 0 - 5    Comment: Performed at [facility], [place] [address] [place], [place] [address]  I-stat troponin, ED (not at [facility], [facility])     Status:  None   Collection Time: 01/16/18  2:15 AM  Result Value Ref Range   Troponin i,  poc 0.04 0.00 - 0.08 ng/mL   Comment 3            Comment: Due to the release kinetics of cTnI, a negative result within the first hours of the onset of symptoms does not rule out myocardial infarction with certainty. If myocardial infarction is still suspected, repeat the test at appropriate intervals.     Ct Head Wo Contrast  Result Date: 01/16/2018 CLINICAL DATA:  Fall. Initial encounter. EXAM: CT HEAD WITHOUT CONTRAST CT CERVICAL SPINE WITHOUT CONTRAST TECHNIQUE: Multidetector CT imaging of the head and cervical spine was performed following the standard protocol without intravenous contrast. Multiplanar CT image reconstructions of the cervical spine were also generated. COMPARISON:  Head CT 08/05/2014. Cervical spine radiographs 08/08/2003. FINDINGS: CT HEAD FINDINGS Brain: A variably hyperattenuating subdural hematoma extending diffusely over the left cerebral convexity measures up to 11 mm in thickness. There is slight left cerebral hemispheric sulcal effacement without midline shift. There is no evidence of acute infarct. Mild cerebral atrophy is noted. Vascular: Calcified atherosclerosis at the skull base. No hyperdense vessel. Skull: No fracture or focal osseous lesion. Sinuses/Orbits: Paranasal sinuses and mastoid air cells are clear. Bilateral cataract extraction. Other: None. CT CERVICAL SPINE FINDINGS Alignment: Slight reversal of the normal cervical lordosis. Chronic grade 1 anterolisthesis of C4 on C5. Skull base and vertebrae: No acute fracture or destructive osseous lesion. Asymmetric, advanced left-sided C1-2 arthropathy with small erosions. Soft tissues and spinal canal: No prevertebral fluid or swelling. No visible canal hematoma. Disc levels: Ankylosis across the C5-6 disc space and posterior elements. Moderate to severe disc space narrowing at C3-4, C4-5, and C6-7. Moderate multilevel facet arthrosis. Moderate left neural foraminal stenosis at C3-4. Upper chest: Mild biapical  pleuroparenchymal scarring. Centrilobular emphysema. Other: Diffuse thyroid heterogeneity with multiple small nodules of varying attenuation and calcification. Mild calcific carotid artery atherosclerosis. IMPRESSION: 1. Acute left subdural hematoma measuring 11 mm in thickness. No midline shift. 2. No evidence of acute cervical spine fracture or traumatic subluxation. Critical Value/emergent results were called by telephone at the time of interpretation on 01/16/2018 at 2:56 am to Dr. Delora Fuel, who verbally acknowledged these results. Electronically Signed   By: Logan Bores M.D.   On: 01/16/2018 02:58   Ct Cervical Spine Wo Contrast  Result Date: 01/16/2018 CLINICAL DATA:  Fall. Initial encounter. EXAM: CT HEAD WITHOUT CONTRAST CT CERVICAL SPINE WITHOUT CONTRAST TECHNIQUE: Multidetector CT imaging of the head and cervical spine was performed following the standard protocol without intravenous contrast. Multiplanar CT image reconstructions of the cervical spine were also generated. COMPARISON:  Head CT 08/05/2014. Cervical spine radiographs 08/08/2003. FINDINGS: CT HEAD FINDINGS Brain: A variably hyperattenuating subdural hematoma extending diffusely over the left cerebral convexity measures up to 11 mm in thickness. There is slight left cerebral hemispheric sulcal effacement without midline shift. There is no evidence of acute infarct. Mild cerebral atrophy is noted. Vascular: Calcified atherosclerosis at the skull base. No hyperdense vessel. Skull: No fracture or focal osseous lesion. Sinuses/Orbits: Paranasal sinuses and mastoid air cells are clear. Bilateral cataract extraction. Other: None. CT CERVICAL SPINE FINDINGS Alignment: Slight reversal of the normal cervical lordosis. Chronic grade 1 anterolisthesis of C4 on C5. Skull base and vertebrae: No acute fracture or destructive osseous lesion. Asymmetric, advanced left-sided C1-2 arthropathy with small erosions. Soft tissues and spinal canal: No  prevertebral fluid or swelling. No visible canal  hematoma. Disc levels: Ankylosis across the C5-6 disc space and posterior elements. Moderate to severe disc space narrowing at C3-4, C4-5, and C6-7. Moderate multilevel facet arthrosis. Moderate left neural foraminal stenosis at C3-4. Upper chest: Mild biapical pleuroparenchymal scarring. Centrilobular emphysema. Other: Diffuse thyroid heterogeneity with multiple small nodules of varying attenuation and calcification. Mild calcific carotid artery atherosclerosis. IMPRESSION: 1. Acute left subdural hematoma measuring 11 mm in thickness. No midline shift. 2. No evidence of acute cervical spine fracture or traumatic subluxation. Critical Value/emergent results were called by telephone at the time of interpretation on 01/16/2018 at 2:56 am to Dr. Delora Fuel, who verbally acknowledged these results. Electronically Signed   By: Logan Bores M.D.   On: 01/16/2018 02:58   Dg Chest Port 1 View  Result Date: 01/16/2018 CLINICAL DATA:  Shortness of breath.  Nose bleed. EXAM: PORTABLE CHEST 1 VIEW COMPARISON:  01/08/2018 FINDINGS: The lungs are hyperinflated with diffuse interstitial prominence. No focal airspace consolidation or pulmonary edema. No pleural effusion or pneumothorax. Normal cardiomediastinal contours. Left chest wall pacemaker with unchanged position of leads. IMPRESSION: COPD without acute airspace disease. Electronically Signed   By: Ulyses Jarred M.D.   On: 01/16/2018 02:25    Review of Systems - Negative except Negative except as per HPI    Blood pressure (!) 148/75, pulse 86, temperature 98.2 F (36.8 C), temperature source Axillary, resp. rate 19, SpO2 97 %. Physical Exam  Constitutional: She is oriented to person, place, and time. She appears well-developed and well-nourished.  HENT:  Head: Normocephalic. Head is with raccoon's eyes, with abrasion, with contusion and with left periorbital erythema.    Neurological: She is alert and  oriented to person, place, and time. She has normal strength and normal reflexes. No cranial nerve deficit or sensory deficit. GCS eye subscore is 4. GCS verbal subscore is 5. GCS motor subscore is 6.  No drift    Assessment/Plan: Patient has left SDH with 11 mm shift.  She is neurologically normal.  I discussed, over the course of 20 minutes with patient and her daughter, the issues of subdural bleeding and the potential for needing surgery (she currently does not require surgical intervention).  They have decided that she never wants to have surgery and in addition, have elected to make her DNR, Do not scan, Do not operate.  She will be admitted to the Medicine service and treated medically, but will have no further CT scans and will not consent to surgery.  Peggyann Shoals, MD 01/16/2018, 3:46 AM

## 2018-01-16 NOTE — ED Notes (Signed)
Spoke to admit dr diet ordered family in room

## 2018-01-16 NOTE — Progress Notes (Signed)
[facility] TEAM 1 - Stepdown/ICU TEAM  YAHAYRA GEIS  ACZ:[id number] DOB: 04/06/1922 DOA: 01/16/2018 PCP: Crist Infante, MD    Brief Narrative:  84 y.o. female with history of COPD, HTN, and chronic combined systolic and diastolic CHF who was brought to the ER after she was found to have right-sided weakness following a fall. The pt was not able to recall the exact circumstances of her fall.    In the ER CT head showed left-sided subdural hematoma and Dr. Vertell Limber was consulted.   Subjective: Pt was seen for a f/u visit.   Assessment & Plan:  Subdural hematoma Has been seen by NS - does not wish to consider surgery even if progressed to point it was indicated, therefore will not image further and simply follow clinical condition   Falls of unclear etiology Unclear if she suffered syncope, or mechanical fall w/ concussion - follow on tele - PT/OT to see   Acute respiratory failure with hypoxia / COPD exacerbation improved after patient was placed on BiPAP and nebulizer treatment - stable at time of f/u exam   Fracture of R femur greater tuberosity Noted on pelvic xray - pt denies signif pain at this time - PT/OT   Chronic combined systolic and diastolic CHF compensated.  Closely observe.  Usually takes Lasix.  Presently n.p.o.  Hypertension  History of pacemaker placement  GERD   DVT prophylaxis: SCDs Code Status: DNR CODE Family Communication: spoke w/ daughter at bedside at length  Disposition Plan: stable for tele bed - PT/OT evals   Consultants:  NS  Antimicrobials:  none  Objective: Blood pressure (!) 136/99, pulse 97, temperature 98.2 F (36.8 C), temperature source Axillary, resp. rate (!) 23, SpO2 95 %.  Intake/Output Summary (Last 24 hours) at 01/16/2018 1555 Last data filed at 01/16/2018 1503 Gross per 24 hour  Intake -  Output 500 ml  Net -500 ml   There were no vitals filed for this visit.  Examination: Pt is seen for a f/u visit.   CBC: Recent Labs   Lab 01/16/18 0211 01/16/18 0544  WBC 10.2 6.8  NEUTROABS 7.8* 6.4  HGB 13.0 13.4  HCT 40.1 40.6  MCV 97.3 96.4  PLT 166 109   Basic Metabolic Panel: Recent Labs  Lab 01/16/18 0211 01/16/18 0544  NA 136 136  K 3.7 4.3  CL 94* 100  CO2 29 29  GLUCOSE 129* 134*  BUN 14 12  CREATININE 0.90 0.85  CALCIUM 8.9 8.9   GFR: Estimated Creatinine Clearance: 30.5 mL/min (by C-G formula based on SCr of 0.85 mg/dL).  Liver Function Tests: Recent Labs  Lab 01/16/18 0211 01/16/18 0544  AST 22 22  ALT 18 20  ALKPHOS 70 66  BILITOT 1.1 1.2  PROT 6.1* 6.5  ALBUMIN 3.6 3.5    Coagulation Profile: Recent Labs  Lab 01/16/18 0544  INR 1.09    Cardiac Enzymes: Recent Labs  Lab 01/16/18 0544 01/16/18 1131  TROPONINI 0.05* 0.05*    HbA1C: Hgb A1c MFr Bld  Date/Time Value Ref Range Status  08/05/2014 03:12 AM 5.7 (H) 4.8 - 5.6 % Final    Comment:    (NOTE)         Pre-diabetes: 5.7 - 6.4         Diabetes: >6.4         Glycemic control for adults with diabetes: <7.0   11/26/2013 08:55 AM 5.9 (H) <5.7 % Final    Comment:    (NOTE)  According to the ADA Clinical Practice Recommendations for 2011, when HbA1c is used as a screening test:  >=6.5%   Diagnostic of Diabetes Mellitus           (if abnormal result is confirmed) 5.7-6.4%   Increased risk of developing Diabetes Mellitus References:Diagnosis and Classification of Diabetes Mellitus,Diabetes AUQJ,3354,56(YBWLS 1):S62-S69 and Standards of Medical Care in         Diabetes - 2011,Diabetes LHTD,4287,68 (Suppl 1):S11-S61.    CBG: Recent Labs  Lab 01/16/18 0902  GLUCAP 111*    Scheduled Meds: Continuous Infusions:   LOS: 0 days   Cherene Altes, MD [name] Office  [phone] Pager - Text Page per Amion  If 7PM-7AM, please contact night-coverage per Amion 01/16/2018, 3:55 PM

## 2018-01-17 DIAGNOSIS — R55 Syncope and collapse: Secondary | ICD-10-CM

## 2018-01-17 LAB — GLUCOSE, CAPILLARY
GLUCOSE-CAPILLARY: 84 mg/dL (ref 70–99)
Glucose-Capillary: 82 mg/dL (ref 70–99)
Glucose-Capillary: 91 mg/dL (ref 70–99)

## 2018-01-17 LAB — COMPREHENSIVE METABOLIC PANEL
ALBUMIN: 3 g/dL — AB (ref 3.5–5.0)
ALK PHOS: 55 U/L (ref 38–126)
ALT: 18 U/L (ref 0–44)
AST: 24 U/L (ref 15–41)
Anion gap: 9 (ref 5–15)
BUN: 16 mg/dL (ref 8–23)
CALCIUM: 8.7 mg/dL — AB (ref 8.9–10.3)
CO2: 30 mmol/L (ref 22–32)
Chloride: 97 mmol/L — ABNORMAL LOW (ref 98–111)
Creatinine, Ser: 0.86 mg/dL (ref 0.44–1.00)
GFR calc Af Amer: >60 mL/min (ref >60)
GFR calc non Af Amer: 56 mL/min — ABNORMAL LOW (ref >60)
GLUCOSE: 100 mg/dL — AB (ref 70–99)
Potassium: 3.9 mmol/L (ref 3.5–5.1)
Sodium: 136 mmol/L (ref 135–145)
Total Bilirubin: 1.1 mg/dL (ref 0.3–1.2)
Total Protein: 5.7 g/dL — ABNORMAL LOW (ref 6.5–8.1)

## 2018-01-17 LAB — CBC
HCT: 40.2 % (ref 36.0–46.0)
Hemoglobin: 12.7 g/dL (ref 12.0–15.0)
MCH: 31 pg (ref 26.0–34.0)
MCHC: 31.6 g/dL (ref 30.0–36.0)
MCV: 98 fL (ref 80.0–100.0)
PLATELETS: 164 10*3/uL (ref 150–400)
RBC: 4.1 MIL/uL (ref 3.87–5.11)
RDW: 12.8 % (ref 11.5–15.5)
WBC: 14.2 10*3/uL — ABNORMAL HIGH (ref 4.0–10.5)
nRBC: 0 % (ref 0.0–0.2)

## 2018-01-17 LAB — TSH: TSH: 0.963 u[IU]/mL (ref 0.350–4.500)

## 2018-01-17 MED ORDER — LOSARTAN POTASSIUM 25 MG PO TABS
25.0000 mg | ORAL_TABLET | Freq: Every day | ORAL | Status: DC
  Administered 2018-01-17 – 2018-01-18 (×2): 25 mg via ORAL
  Filled 2018-01-17 (×2): qty 1

## 2018-01-17 NOTE — Evaluation (Signed)
Occupational Therapy Evaluation Patient Details Name: Jamie [name] MRN: [id number] DOB: 27-May-1922 Today's Date: 01/17/2018    History of Present Illness 84 y.o. female with history of COPD, HTN, and chronic combined systolic and diastolic CHF who was brought to the ER after she was found to have right-sided weakness following a fall. The pt was not able to recall the exact circumstances of her fall. CT showed L SDH.    Clinical Impression   PTA patient independent with ADLs/IADLs. Patient currently admitted for above and limited by decreased activity tolerance, generalized weakness and impaired balance.  Patient currently requires setup assist for UB ADLs, min assist for LB ADLs, min guard assist for grooming at sink, min guard assist for transfers/mobility using RW, and supervision for bed mobility.  Pt plans to dc home to daughters with 24/7 support. Reviewed safety, fall prevention, and energy conservation techniques.  Patient will benefit from continued OT services while admitted and recommend HHOT after dc in order to optimize return to PLOF.  Will continue to follow.     Follow Up Recommendations  Home health OT;Supervision - Intermittent    Equipment Recommendations  3 in 1 bedside commode    Recommendations for Other Services       Precautions / Restrictions Precautions Precautions: Fall Precaution Comments: fell 2 days in a row, thinks she blacked out this time Restrictions Weight Bearing Restrictions: No Other Position/Activity Restrictions: on 2L O2 at home      Mobility Bed Mobility Overal bed mobility: Needs Assistance Bed Mobility: Supine to Sit;Sit to Supine     Supine to sit: Supervision Sit to supine: Supervision   General bed mobility comments: increased time and rail  Transfers Overall transfer level: Needs assistance Equipment used: Rolling walker (2 wheeled) Transfers: Sit to/from Stand Sit to Stand: Min guard         General transfer comment:  min guard for safety and balance, cueing for hand placement    Balance Overall balance assessment: Needs assistance;History of Falls Sitting-balance support: No upper extremity supported Sitting balance-Leahy Scale: Good     Standing balance support: No upper extremity supported;During functional activity Standing balance-Leahy Scale: Poor Standing balance comment: able to complete grooming tasks without UE support, but preference to UE support                           ADL either performed or assessed with clinical judgement   ADL Overall ADL's : Needs assistance/impaired     Grooming: Wash/dry hands;Oral care;Min guard;Standing   Upper Body Bathing: Set up;Sitting   Lower Body Bathing: Minimal assistance;Sit to/from stand   Upper Body Dressing : Set up;Sitting   Lower Body Dressing: Minimal assistance;Sit to/from stand   Toilet Transfer: Min guard;Ambulation;RW(simulated in room)   Toileting- Clothing Manipulation and Hygiene: Min guard;Sit to/from stand       Functional mobility during ADLs: Min guard;Rolling walker General ADL Comments: Educated on safety and fall prevention techniques, including sitting in shower and completing LB dressing seated.  Pt requires cueing for walker mgmt/safety, as not used to using.      Vision Baseline Vision/History: Wears glasses Wears Glasses: Distance only Patient Visual Report: No change from baseline Vision Assessment?: No apparent visual deficits     Perception     Praxis      Pertinent Vitals/Pain Pain Assessment: No/denies pain     Hand Dominance     Extremity/Trunk Assessment Upper Extremity Assessment Upper  Extremity Assessment: Generalized weakness   Lower Extremity Assessment Lower Extremity Assessment: Defer to PT evaluation LLE Deficits / Details: hairline fx L femur, not painful LLE Sensation: WNL LLE Coordination: WNL   Cervical / Trunk Assessment Cervical / Trunk Assessment: Normal    Communication Communication Communication: No difficulties   Cognition Arousal/Alertness: Awake/alert Behavior During Therapy: WFL for tasks assessed/performed Overall Cognitive Status: Within Functional Limits for tasks assessed                                 General Comments: very sharp for her 89 years   General Comments  disucssed energy conservation, safety and modified techniques for ADLs for fall prevention     Exercises     Shoulder Instructions      Home Living Family/patient expects to be discharged to:: Private residence Living Arrangements: Children Available Help at Discharge: Family;Available 24 hours/day Type of Home: House Home Access: Stairs to enter [employer] of Steps: 3 Entrance Stairs-Rails: Right;Left Home Layout: One level     Bathroom Shower/Tub: Occupational psychologist: Standard     Home Equipment: Environmental consultant - 2 wheels;Cane - single point   Additional Comments: pt has been living alone in split level house but is going to move in with her daughter in single level home      Prior Functioning/Environment Level of Independence: Independent        Comments: independent with ADLs, IADLs and mobility (using cane at times), did not drive        OT Problem List: Decreased strength;Decreased activity tolerance;Impaired balance (sitting and/or standing);Decreased knowledge of precautions;Decreased knowledge of use of DME or AE;Decreased safety awareness      OT Treatment/Interventions: Self-care/ADL training;Therapeutic exercise;Neuromuscular education;Energy conservation;DME and/or AE instruction;Therapeutic activities;Patient/family education;Balance training    OT Goals(Current goals can be found in the care plan section) Acute Rehab OT Goals Patient Stated Goal: return home, stop falling OT Goal Formulation: With patient Time For Goal Achievement: 01/31/18 Potential to Achieve Goals: Good  OT Frequency:  Min 2X/week   Barriers to D/C:            Co-evaluation              AM-PAC PT "6 Clicks" Daily Activity     Outcome Measure Help from another person eating meals?: None Help from another person taking care of personal grooming?: A Little Help from another person toileting, which includes using toliet, bedpan, or urinal?: A Little Help from another person bathing (including washing, rinsing, drying)?: A Little Help from another person to put on and taking off regular upper body clothing?: A Little Help from another person to put on and taking off regular lower body clothing?: A Little 6 Click Score: 19   End of Session Equipment Utilized During Treatment: Gait belt;Rolling walker;Oxygen Nurse Communication: Mobility status  Activity Tolerance: Patient tolerated treatment well Patient left: in bed;with call bell/phone within reach;with bed alarm set;with family/visitor present  OT Visit Diagnosis: Unsteadiness on feet (R26.81);Muscle weakness (generalized) (M62.81);History of falling (Z91.81)                Time: 6195-0932 OT Time Calculation (min): 22 min Charges:  OT General Charges $OT Visit: 1 Visit OT Evaluation $OT Eval Moderate Complexity: [place], OT Acute Rehabilitation Services Pager [phone] Office [phone]   Delight Stare 01/17/2018, 4:53 PM

## 2018-01-17 NOTE — Progress Notes (Signed)
Pt called out and stated Purewick did not work properly, said she had wet her bed. Purewick was not hooked up to suction, & had irritated genitalia to the point that it was bleeding. Pt was placed on BSC, bedding changed, peri care performed. Helped pt back into bed. Will continue to monitor.

## 2018-01-17 NOTE — Evaluation (Signed)
Physical Therapy Evaluation Patient Details Name: Jamie [name] MRN: [id number] DOB: 1922/03/20 Today's Date: 01/17/2018   History of Present Illness  84 y.o. female with history of COPD, HTN, and chronic combined systolic and diastolic CHF who was brought to the ER after she was found to have right-sided weakness following a fall. The pt was not able to recall the exact circumstances of her fall. CT showed L SDH.   Clinical Impression  Pt admitted with above diagnosis. Pt currently with functional limitations due to the deficits listed below (see PT Problem List). Pt unsteady with ambulation, was going to attempt walking without AD as she does at home but she was too unsteady with AD to do this. Did well with rollator and practiced sitting down on it and getting back up. Pt feels she may have blacked out the other day when she fell and sats are dropping into upper 80's with exertion and DOE 3/4 so rollator would be beneficial for energy conservation and safety.  Pt will benefit from skilled PT to increase their independence and safety with mobility to allow discharge to the venue listed below.       Follow Up Recommendations Home health PT;Supervision for mobility/OOB    Equipment Recommendations  3in1 (PT);Other (comment)(4 wheel rollator with seat)    Recommendations for Other Services       Precautions / Restrictions Precautions Precautions: Fall Precaution Comments: fell 2 days in a row, thinks she blacked out this time Restrictions Weight Bearing Restrictions: No Other Position/Activity Restrictions: on 2L O2 at home      Mobility  Bed Mobility Overal bed mobility: Needs Assistance Bed Mobility: Supine to Sit     Supine to sit: Supervision     General bed mobility comments: increased time and rail  Transfers Overall transfer level: Needs assistance Equipment used: 4-wheeled walker Transfers: Sit to/from Stand Sit to Stand: Min guard         General transfer  comment: reaches for stable surfaces  Ambulation/Gait Ambulation/Gait assistance: Min guard Gait Distance (Feet): (100', seated rest, 175') Assistive device: 4-wheeled walker Gait Pattern/deviations: Step-through pattern;Decreased stride length Gait velocity: decreased Gait velocity interpretation: <1.8 ft/sec, indicate of risk for recurrent falls General Gait Details: pt needed seated rest break at 100' due to SOB, O2 sats down to 90% on 2L O2. Had planned to attempt ambulation without AD after first bout but pt felt too unsteady to ambulate without it. Practiced sitting down on rollator and getting back up. Second bout of ambulation longer, pt down to 89% O2 sats on 2L O2 by return to room.   Stairs            Wheelchair Mobility    Modified Rankin (Stroke Patients Only)       Balance Overall balance assessment: Needs assistance;History of Falls Sitting-balance support: No upper extremity supported Sitting balance-Leahy Scale: Good     Standing balance support: Bilateral upper extremity supported Standing balance-Leahy Scale: Poor Standing balance comment: unsteady in standing without UE support                             Pertinent Vitals/Pain Pain Assessment: No/denies pain    Home Living Family/patient expects to be discharged to:: Private residence Living Arrangements: Children Available Help at Discharge: Family;Available 24 hours/day Type of Home: House Home Access: Stairs to enter Entrance Stairs-Rails: Psychiatric nurse of Steps: 3 Home Layout: One level Home Equipment: Environmental consultant -  2 wheels;Cane - single point Additional Comments: pt has been living alone in split level house but is going to move in with her daughter in single level home    Prior Function Level of Independence: Independent         Comments: occasionally used cane, did not drive     Hand Dominance        Extremity/Trunk Assessment   Upper Extremity  Assessment Upper Extremity Assessment: Generalized weakness    Lower Extremity Assessment Lower Extremity Assessment: LLE deficits/detail LLE Deficits / Details: hairline fx L femur, not painful LLE Sensation: WNL LLE Coordination: WNL    Cervical / Trunk Assessment Cervical / Trunk Assessment: Normal  Communication   Communication: No difficulties  Cognition Arousal/Alertness: Awake/alert Behavior During Therapy: WFL for tasks assessed/performed Overall Cognitive Status: Within Functional Limits for tasks assessed                                 General Comments: very sharp for her 39 years      General Comments General comments (skin integrity, edema, etc.): discussed energy conservation methods    Exercises     Assessment/Plan    PT Assessment Patient needs continued PT services  PT Problem List Cardiopulmonary status limiting activity;Decreased knowledge of use of DME;Decreased activity tolerance;Decreased balance;Decreased mobility;Decreased strength       PT Treatment Interventions DME instruction;Gait training;Functional mobility training;Therapeutic activities;Therapeutic exercise;Stair training;Balance training;Patient/family education;Neuromuscular re-education    PT Goals (Current goals can be found in the Care Plan section)  Acute Rehab PT Goals Patient Stated Goal: return home, stop falling PT Goal Formulation: With patient/family Time For Goal Achievement: 01/31/18 Potential to Achieve Goals: Good    Frequency Min 3X/week   Barriers to discharge        Co-evaluation               AM-PAC PT "6 Clicks" Daily Activity  Outcome Measure Difficulty turning over in bed (including adjusting bedclothes, sheets and blankets)?: None Difficulty moving from lying on back to sitting on the side of the bed? : A Little Difficulty sitting down on and standing up from a chair with arms (e.g., wheelchair, bedside commode, etc,.)?: A Little Help  needed moving to and from a bed to chair (including a wheelchair)?: A Little Help needed walking in hospital room?: A Little Help needed climbing 3-5 steps with a railing? : A Lot 6 Click Score: 18    End of Session Equipment Utilized During Treatment: Gait belt;Oxygen Activity Tolerance: Patient limited by fatigue Patient left: in chair;with call bell/phone within reach;with family/visitor present;with chair alarm set Nurse Communication: Mobility status PT Visit Diagnosis: Difficulty in walking, not elsewhere classified (R26.2);Muscle weakness (generalized) (M62.81)    Time: 3664-4034 PT Time Calculation (min) (ACUTE ONLY): 49 min   Charges:   PT Evaluation $PT Eval Moderate Complexity: 1 Mod PT Treatments $Gait Training: 23-37 mins        Leighton Roach, PT  Acute Rehab Services  Pager [phone] Office [place] 01/17/2018, 2:10 PM

## 2018-01-17 NOTE — Consult Note (Addendum)
ELECTROPHYSIOLOGY CONSULT NOTE    Patient ID: Jamie [name] MRN: [id number], DOB/AGE: May 20, 1922 84 y.o.  Admit date: 01/16/2018 Date of Consult: 01/17/2018  Primary Physician: Crist Infante, MD Electrophysiologist: Caryl Comes  Patient Profile: Jamie [name] is a 84 y.o. female with a history of CAD, COPD, ICM, HTN, complete heart block s/p MDT dual chamber PPM, orthostatic hypotension who is being seen today for the evaluation of syncope at the request of Dr Thereasa Solo.  HPI:  Jamie [name] is a 84 y.o. female with the above past medical history. She has a history of falls and orthostatic intolerance.  She fell on Saturday and then on Sunday fell again while in her kitchen. Both episodes occurred while standing.  She does not remember anything from Sunday other than reaching the phone after crawling up 7 stairs to call for help.  Because of memory of the episode, she is unsure if she had warning before she fell.  She was transported to [facility] and found to have left sided subdural hematoma.  Neurosurgery was consulted. The patient declined intervention.  EP has been asked to evaluate for potential arrhythmias that could have caused her episode.  She is chronically short of breath at baseline but denies chest pain, palpitations, PND, orthopnea, nausea, vomiting, edema, weight gain, or early satiety.  Past Medical History:  Diagnosis Date  . Asthma   . Carotid stenosis    a. Carotid US (9/15):  Bilateral ICA 1-39%  . Chronic combined systolic and diastolic CHF (congestive heart failure) ([place])    a. EF 40-45% in 11/2013, improved to 55-60% in 05/2016.  Marland Kitchen Complete AV block ([place])   . COPD (chronic obstructive pulmonary disease) ([place])   . Family history of anesthesia complication    SISTER HAS DIFFICULTY WAKING  . GERD (gastroesophageal reflux disease)   . Hypertension   . Ischemic cardiomyopathy    a.  Echo (9/15):  EF 40-45%, ant and apical and inferoapical HK, Gr 1 DD, trivial MR, mild TR, PASP 23  mmHg;  b. Myoview (9/15) large scar in mid LAD territory, no peri-infarct ischemia, EF 60%, apical AK; intermediate risk >> patient opted for med Rx. b. EF 55-60% by echo 05/2016.  . Orthostasis   . Osteoarthritis   . Osteopenia   . Pacemaker   . Pneumonia   . Pre-syncope   . Presence of permanent cardiac pacemaker   . Shortness of breath   . Tachycardia   . TIA (transient ischemic attack)    a. possible TIA 11/2013.  Marland Kitchen UTI (urinary tract infection)      Surgical History:  Past Surgical History:  Procedure Laterality Date  . CHOLECYSTECTOMY    . EYE SURGERY  10/2004  . HERNIA REPAIR  11/2002  . PACEMAKER PLACEMENT     [employer]     Medications Prior to Admission  Medication Sig Dispense Refill Last Dose  . albuterol (PROAIR HFA) 108 (90 BASE) MCG/ACT inhaler Inhale 2 puffs into the lungs every 6 (six) hours as needed for wheezing or shortness of breath.    01/15/2018 at Unknown time  . ALPRAZolam (XANAX) 0.5 MG tablet Take 0.5 mg by mouth at bedtime as needed for sleep.    Past Week at Unknown time  . aspirin EC 81 MG tablet Take 81 mg by mouth daily.    01/15/2018 at Unknown time  . beta carotene w/minerals (OCUVITE) tablet Take 1 tablet by mouth daily.   01/15/2018 at Unknown time  . Calcium  Carbonate (CALTRATE 600) 1500 MG TABS Take 600 mg by mouth daily.    01/15/2018 at Unknown time  . cholecalciferol (VITAMIN D) 1000 UNITS tablet Take 1,000 Units by mouth daily.     01/15/2018 at Unknown time  . Coenzyme Q10 (CO Q 10 PO) Take 1 capsule by mouth daily.   01/15/2018 at Unknown time  . cyanocobalamin 1000 MCG tablet Take 1,000 mcg by mouth daily.    01/15/2018 at Unknown time  . furosemide (LASIX) 20 MG tablet Take 1 tablet (20 mg total) by mouth daily.   01/15/2018 at Unknown time  . isosorbide mononitrate (IMDUR) 30 MG 24 hr tablet Take 1 tablet (30 mg total) by mouth daily. Please keep upcoming appt in December with Dr. Caryl Comes for future refills. Thank you (Patient taking differently:  Take 30 mg by mouth daily. ) 90 tablet 0 01/15/2018 at Unknown time  . levalbuterol (XOPENEX) 0.63 MG/3ML nebulizer solution Take 0.63 mg by nebulization every 6 (six) hours as needed for shortness of breath.   01/15/2018 at Unknown time  . losartan (COZAAR) 25 MG tablet Take 1 tablet (25 mg total) by mouth daily. Please keep upcoming appt for future refills. Thank you 30 tablet 2 01/15/2018 at Unknown time  . Magnesium 250 MG TABS Take 250 mg by mouth daily.    01/15/2018 at Unknown time  . nitroGLYCERIN (NITROSTAT) 0.4 MG SL tablet Place 1 tablet (0.4 mg total) under the tongue every 5 (five) minutes as needed for chest pain. 15 tablet 12 Past Week at Unknown time  . pantoprazole (PROTONIX) 40 MG tablet Take 40 mg by mouth daily.  3 01/15/2018 at Unknown time  . Polyethyl Glycol-Propyl Glycol (SYSTANE) 0.4-0.3 % SOLN Place 1 drop into both eyes 2 (two) times daily as needed (dry eyes).   Past Week at Unknown time  . potassium chloride (K-DUR) 10 MEQ tablet Take 10 mEq by mouth 2 (two) times a week. Tuesday and Friday   01/13/2018 at Unknown time    Inpatient Medications:  . calcium carbonate  1,250 mg Oral Q breakfast  . cholecalciferol  1,000 Units Oral Daily  . losartan  25 mg Oral Daily  . magnesium oxide  400 mg Oral Daily  . multivitamin  1 tablet Oral Daily  . pantoprazole  40 mg Oral Daily  . cyanocobalamin  1,000 mcg Oral Daily    Allergies:  Allergies  Allergen Reactions  . Ace Inhibitors Cough  . Hydrocodone-Acetaminophen Other (See Comments)     GI distress    Social History   Socioeconomic History  . Marital status: Widowed    Spouse name: Not on file  . Number of children: Not on file  . Years of education: Not on file  . Highest education level: Not on file  Occupational History  . Occupation: retired    Fish farm manager: RETIRED  Social Needs  . Financial resource strain: Not hard at all  . Food insecurity:    Worry: Never true    Inability: Never true  . Transportation  needs:    Medical: No    Non-medical: No  Tobacco Use  . Smoking status: Former Smoker    Packs/day: 1.00    Years: 35.00    Pack years: 35.00    Types: Cigarettes    Last attempt to quit: 03/16/1987    Years since quitting: 30.8  . Smokeless tobacco: Never Used  Substance and Sexual Activity  . Alcohol use: No  . Drug use: No  .  Sexual activity: Never    Birth control/protection: Post-menopausal  Lifestyle  . Physical activity:    Days per week: 7 days    Minutes per session: 30 min  . Stress: Not at all  Relationships  . Social connections:    Talks on phone: Twice a week    Gets together: More than three times a week    Attends religious service: Never    Active member of club or organization: No    Attends meetings of clubs or organizations: Never    Relationship status: Widowed  . Intimate partner violence:    Fear of current or ex partner: No    Emotionally abused: No    Physically abused: No    Forced sexual activity: No  Other Topics Concern  . Not on file  Social History Narrative  . Not on file     Family History  Problem Relation Age of Onset  . Heart disease Sister   . Breast cancer Sister      Review of Systems: All other systems reviewed and are otherwise negative except as noted above.  Physical Exam: Vitals:   01/17/18 0500 01/17/18 0746 01/17/18 0756 01/17/18 1210  BP:  (!) 162/77  (!) 149/66  Pulse:  79  80  Resp:  18  18  Temp:  98.7 F (37.1 C)  98.4 F (36.9 C)  TempSrc:  Oral  Oral  SpO2:  100% 99% 99%  Weight: 47.2 kg       GEN- The patient is elderly and frail appearing, alert and oriented x 3 today.   HEENT: normocephalic, atraumatic; sclera clear, conjunctiva pink; hearing intact; oropharynx clear; neck supple Lungs- Clear to ausculation bilaterally, normal work of breathing.  No wheezes, rales, rhonchi Heart- Regular rate and rhythm (paced) GI- soft, non-tender, non-distended, bowel sounds present Extremities- no clubbing,  cyanosis, or edema  MS- no significant deformity or atrophy Skin- warm and dry, no rash or lesion, left upper chest with pacemaker incision well healed Psych- euthymic mood, full affect Neuro- strength and sensation are intact  Labs:   Lab Results  Component Value Date   WBC 14.2 (H) 01/17/2018   HGB 12.7 01/17/2018   HCT 40.2 01/17/2018   MCV 98.0 01/17/2018   PLT 164 01/17/2018    Recent Labs  Lab 01/17/18 0350  NA 136  K 3.9  CL 97*  CO2 30  BUN 16  CREATININE 0.86  CALCIUM 8.7*  PROT 5.7*  BILITOT 1.1  ALKPHOS 55  ALT 18  AST 24  GLUCOSE 100*      Radiology/Studies: Dg Chest 2 View  Result Date: 01/08/2018 CLINICAL DATA:  Shortness of breath. EXAM: CHEST - 2 VIEW COMPARISON:  03/29/2007 FINDINGS: 18 the cardiac silhouette, mediastinal and hilar contours are stable. There is tortuosity and calcification of the thoracic aorta. The pacer wires are stable. Stable underlying emphysematous changes and pulmonary scarring. No acute overlying pulmonary process such as edema, infiltrates or effusions. The bony thorax is intact. IMPRESSION: Chronic emphysematous changes but no acute overlying pulmonary findings. Electronically Signed   By: Marijo Sanes M.D.   On: 01/08/2018 12:29   Ct Head Wo Contrast  Result Date: 01/16/2018 CLINICAL DATA:  Fall. Initial encounter. EXAM: CT HEAD WITHOUT CONTRAST CT CERVICAL SPINE WITHOUT CONTRAST TECHNIQUE: Multidetector CT imaging of the head and cervical spine was performed following the standard protocol without intravenous contrast. Multiplanar CT image reconstructions of the cervical spine were also generated. COMPARISON:  Head CT  08/05/2014. Cervical spine radiographs 08/08/2003. FINDINGS: CT HEAD FINDINGS Brain: A variably hyperattenuating subdural hematoma extending diffusely over the left cerebral convexity measures up to 11 mm in thickness. There is slight left cerebral hemispheric sulcal effacement without midline shift. There is no  evidence of acute infarct. Mild cerebral atrophy is noted. Vascular: Calcified atherosclerosis at the skull base. No hyperdense vessel. Skull: No fracture or focal osseous lesion. Sinuses/Orbits: Paranasal sinuses and mastoid air cells are clear. Bilateral cataract extraction. Other: None. CT CERVICAL SPINE FINDINGS Alignment: Slight reversal of the normal cervical lordosis. Chronic grade 1 anterolisthesis of C4 on C5. Skull base and vertebrae: No acute fracture or destructive osseous lesion. Asymmetric, advanced left-sided C1-2 arthropathy with small erosions. Soft tissues and spinal canal: No prevertebral fluid or swelling. No visible canal hematoma. Disc levels: Ankylosis across the C5-6 disc space and posterior elements. Moderate to severe disc space narrowing at C3-4, C4-5, and C6-7. Moderate multilevel facet arthrosis. Moderate left neural foraminal stenosis at C3-4. Upper chest: Mild biapical pleuroparenchymal scarring. Centrilobular emphysema. Other: Diffuse thyroid heterogeneity with multiple small nodules of varying attenuation and calcification. Mild calcific carotid artery atherosclerosis. IMPRESSION: 1. Acute left subdural hematoma measuring 11 mm in thickness. No midline shift. 2. No evidence of acute cervical spine fracture or traumatic subluxation. Critical Value/emergent results were called by telephone at the time of interpretation on 01/16/2018 at 2:56 am to Dr. Delora Fuel, who verbally acknowledged these results. Electronically Signed   By: Logan Bores M.D.   On: 01/16/2018 02:58   Ct Cervical Spine Wo Contrast  Result Date: 01/16/2018 CLINICAL DATA:  Fall. Initial encounter. EXAM: CT HEAD WITHOUT CONTRAST CT CERVICAL SPINE WITHOUT CONTRAST TECHNIQUE: Multidetector CT imaging of the head and cervical spine was performed following the standard protocol without intravenous contrast. Multiplanar CT image reconstructions of the cervical spine were also generated. COMPARISON:  Head CT 08/05/2014.  Cervical spine radiographs 08/08/2003. FINDINGS: CT HEAD FINDINGS Brain: A variably hyperattenuating subdural hematoma extending diffusely over the left cerebral convexity measures up to 11 mm in thickness. There is slight left cerebral hemispheric sulcal effacement without midline shift. There is no evidence of acute infarct. Mild cerebral atrophy is noted. Vascular: Calcified atherosclerosis at the skull base. No hyperdense vessel. Skull: No fracture or focal osseous lesion. Sinuses/Orbits: Paranasal sinuses and mastoid air cells are clear. Bilateral cataract extraction. Other: None. CT CERVICAL SPINE FINDINGS Alignment: Slight reversal of the normal cervical lordosis. Chronic grade 1 anterolisthesis of C4 on C5. Skull base and vertebrae: No acute fracture or destructive osseous lesion. Asymmetric, advanced left-sided C1-2 arthropathy with small erosions. Soft tissues and spinal canal: No prevertebral fluid or swelling. No visible canal hematoma. Disc levels: Ankylosis across the C5-6 disc space and posterior elements. Moderate to severe disc space narrowing at C3-4, C4-5, and C6-7. Moderate multilevel facet arthrosis. Moderate left neural foraminal stenosis at C3-4. Upper chest: Mild biapical pleuroparenchymal scarring. Centrilobular emphysema. Other: Diffuse thyroid heterogeneity with multiple small nodules of varying attenuation and calcification. Mild calcific carotid artery atherosclerosis. IMPRESSION: 1. Acute left subdural hematoma measuring 11 mm in thickness. No midline shift. 2. No evidence of acute cervical spine fracture or traumatic subluxation. Critical Value/emergent results were called by telephone at the time of interpretation on 01/16/2018 at 2:56 am to Dr. Delora Fuel, who verbally acknowledged these results. Electronically Signed   By: Logan Bores M.D.   On: 01/16/2018 02:58   Dg Pelvis Portable  Result Date: 01/16/2018 CLINICAL DATA:  Status post fall yesterday. Now  with right-sided pain  EXAM: PORTABLE PELVIS 1-2 VIEWS COMPARISON:  Abdominal radiograph of November 04, 2010 which included much of the right hip. FINDINGS: The bones are osteopenic. There is some fragmentation of the greater tuberosity of the femur. No intertrochanteric or sub trochanteric fracture is observed. The femoral neck and head are intact. There is mild asymmetric narrowing of the right hip joint space. The observed portions of the pelvis appear intact. IMPRESSION: Fracture of the superolateral aspect of the greater tuberosity of the right femur. The remainder of the visualized portions of the right femur is intact. Mild osteoarthritic change of the right hip. Electronically Signed   By: David  Martinique M.D.   On: 01/16/2018 07:30   Dg Chest Port 1 View  Result Date: 01/16/2018 CLINICAL DATA:  Shortness of breath.  Nose bleed. EXAM: PORTABLE CHEST 1 VIEW COMPARISON:  01/08/2018 FINDINGS: The lungs are hyperinflated with diffuse interstitial prominence. No focal airspace consolidation or pulmonary edema. No pleural effusion or pneumothorax. Normal cardiomediastinal contours. Left chest wall pacemaker with unchanged position of leads. IMPRESSION: COPD without acute airspace disease. Electronically Signed   By: Ulyses Jarred M.D.   On: 01/16/2018 02:25    WLN:[id number] rhythm with V pacing (personally reviewed)  TELEMETRY: sinus rhythm with V pacing (personally reviewed)  DEVICE HISTORY: MDT dual chamber PPM implanted for complete heart block   Assessment/Plan: 1.  Syncope No arrhthymias on device interrogation Agree with stopping Imdur (done already this admission), would 1/2 Lasix at discharge - daughter is concerned about shortness of breath worsening. I have advised that they follow at home and see how she does with daily weights. Discussed that we will need to tolerate higher blood pressures to help prevent orthostasis Will order compression hose although she is not sure she can tolerate Abdominal binder does help  but worsens her acid reflux.  2.  Complete heart block Normal pacemaker function V sensitivity programmed at 34mV. No VHR episodes detected (programmed at 120bpm for 5 beats)  3.  Subdural hematoma Following clinically - patient declined surgery even if recommended   [facility] will sign off.   Medication Recommendations:  Stop Imdur, 1/2 dose Lasix at home Other recommendations (labs, testing, etc):  none Follow up as an outpatient:  With Dr Caryl Comes as scheduled   For questions or updates, please contact [facility] Please consult [email or web address] for contact info under Cardiology/STEMI.  Signed, Chanetta Marshall 01/17/2018 3:57 PM   I have seen, examined the patient, and reviewed the above assessment and plan.  Changes to above are made where necessary.  On exam, RRR.  She is elderly and frail.  PPM interrogation is normal.  I suspect that her syncope is secondary to hypotension.  Reduce lasix to 10mg  daily at discharge.  Stop imdur.  Adequate hydration encouraged.  Follow-up with Dr Caryl Comes as scheduled  Electrophysiology team to see as needed while here. Please call with questions.   Co Sign: Thompson Grayer, MD 01/17/2018 4:48 PM

## 2018-01-17 NOTE — Progress Notes (Addendum)
[place] TEAM 1 - Stepdown/ICU TEAM  Jamie [name]  NWG:[id number] DOB: 1922-04-09 DOA: 01/16/2018 PCP: Crist Infante, MD    Brief Narrative:  84 y.o. female with history of COPD, HTN, and chronic combined systolic and diastolic CHF who was brought to the ER after she was found to have right-sided weakness following a fall. The pt was not able to recall the exact circumstances of her fall.    In the ER CT head showed left-sided subdural hematoma and Dr. Vertell Limber was consulted.   Subjective: Resting comfortably in bed. Denies even a HA today. Does not like the food. No focal weakness. Denies cp, n/v, or abdom pain. Dgtr has noted she has been desaturating into the 70s w/ ambulation while here.   Assessment & Plan:  Subdural hematoma Has been seen by NS - does not wish to consider surgery even if progressed to point it was indicated, therefore will not image further and simply follow clinical condition - clinically stable at this time   Falls of unclear etiology Unclear if she suffered syncope, or mechanical fall w/ concussion - follow on tele - PT/OT to see - clinic records indicate a hx of presyncope/syncope for which she has been followed by Dr. Caryl Comes - during her last EP clinic visit he discussed a possible loop recorder - I have asked Cards to see her while here   Acute respiratory failure with hypoxia / COPD exacerbation / Chronic O2 dependence (2L [place]) improved after patient was placed on BiPAP and nebulizer treatment - stable at time of f/u exam - appears she may now require increased O2 w/ ambulation - will ask staff to quantify w/ ambulatory sats  Fracture of R femur greater tuberosity Noted on pelvic xray - pt denies signif pain at this time - PT/OT   Chronic combined systolic and diastolic CHF No signif volume overload on exam - wgt at last EP visit 50.3kg - follow daily weights - holding diuretic for now as pt may be slightly [facility]  Filed Weights   01/17/18 0500  Weight: 47.2 kg       Hypertension Resume home BP meds in stepwise fashion as tolerated - watch for orthostatsi   History of pacemaker placement Cards to see   GERD   DVT prophylaxis: SCDs Code Status: DNR CODE Family Communication: spoke w/ daughter at bedside at length  Disposition Plan: stable for tele bed - PT/OT evals   Consultants:  NS Cardiology - EP   Antimicrobials:  none  Objective: Blood pressure (!) 162/77, pulse 79, temperature 98.7 F (37.1 C), temperature source Oral, resp. rate 18, weight 47.2 kg, SpO2 99 %.  Intake/Output Summary (Last 24 hours) at 01/17/2018 1331 Last data filed at 01/17/2018 0900 Gross per 24 hour  Intake 240 ml  Output 500 ml  Net -260 ml   Filed Weights   01/17/18 0500  Weight: 47.2 kg    Examination: General: No acute respiratory distress Lungs: Clear to auscultation bilaterally without wheezes or crackles Cardiovascular: frequent ectopic beats - no M or rub  Abdomen: Nontender, nondistended, soft, bowel sounds positive, no rebound, no ascites, no appreciable mass Extremities: No significant cyanosis, clubbing, or edema bilateral lower extremities   CBC: Recent Labs  Lab 01/16/18 0211 01/16/18 0544 01/17/18 0350  WBC 10.2 6.8 14.2*  NEUTROABS 7.8* 6.4  --   HGB 13.0 13.4 12.7  HCT 40.1 40.6 40.2  MCV 97.3 96.4 98.0  PLT 166 163 578   Basic Metabolic Panel:  Recent Labs  Lab 01/16/18 0211 01/16/18 0544 01/17/18 0350  NA 136 136 136  K 3.7 4.3 3.9  CL 94* 100 97*  CO2 29 29 30   GLUCOSE 129* 134* 100*  BUN 14 12 16   CREATININE 0.90 0.85 0.86  CALCIUM 8.9 8.9 8.7*   GFR: Estimated Creatinine Clearance: 30.5 mL/min (by C-G formula based on SCr of 0.86 mg/dL).  Liver Function Tests: Recent Labs  Lab 01/16/18 0211 01/16/18 0544 01/17/18 0350  AST 22 22 24   ALT 18 20 18   ALKPHOS 70 66 55  BILITOT 1.1 1.2 1.1  PROT 6.1* 6.5 5.7*  ALBUMIN 3.6 3.5 3.0*    Coagulation Profile: Recent Labs  Lab 01/16/18 0544  INR  1.09    Cardiac Enzymes: Recent Labs  Lab 01/16/18 0544 01/16/18 1131 01/16/18 1920  TROPONINI 0.05* 0.05* 0.07*    HbA1C: Hgb A1c MFr Bld  Date/Time Value Ref Range Status  08/05/2014 03:12 AM 5.7 (H) 4.8 - 5.6 % Final    Comment:    (NOTE)         Pre-diabetes: 5.7 - 6.4         Diabetes: >6.4         Glycemic control for adults with diabetes: <7.0   11/26/2013 08:55 AM 5.9 (H) <5.7 % Final    Comment:    (NOTE)                                                                       According to the ADA Clinical Practice Recommendations for 2011, when HbA1c is used as a screening test:  >=6.5%   Diagnostic of Diabetes Mellitus           (if abnormal result is confirmed) 5.7-6.4%   Increased risk of developing Diabetes Mellitus References:Diagnosis and Classification of Diabetes Mellitus,Diabetes WCBJ,6283,15(VVOHY 1):S62-S69 and Standards of Medical Care in         Diabetes - 2011,Diabetes WVPX,1062,69 (Suppl 1):S11-S61.    CBG: Recent Labs  Lab 01/16/18 0902 01/17/18 0758  GLUCAP 111* 82    Scheduled Meds: . calcium carbonate  1,250 mg Oral Q breakfast  . cholecalciferol  1,000 Units Oral Daily  . magnesium oxide  400 mg Oral Daily  . multivitamin  1 tablet Oral Daily  . pantoprazole  40 mg Oral Daily  . cyanocobalamin  1,000 mcg Oral Daily     LOS: 1 day   Cherene Altes, MD [name] Office  [phone] Pager - Text Page per Amion  If 7PM-7AM, please contact night-coverage per Amion 01/17/2018, 1:31 PM

## 2018-01-18 DIAGNOSIS — Z950 Presence of cardiac pacemaker: Secondary | ICD-10-CM

## 2018-01-18 DIAGNOSIS — S065X9A Traumatic subdural hemorrhage with loss of consciousness of unspecified duration, initial encounter: Principal | ICD-10-CM

## 2018-01-18 DIAGNOSIS — I442 Atrioventricular block, complete: Secondary | ICD-10-CM

## 2018-01-18 DIAGNOSIS — I5042 Chronic combined systolic (congestive) and diastolic (congestive) heart failure: Secondary | ICD-10-CM

## 2018-01-18 LAB — GLUCOSE, CAPILLARY: GLUCOSE-CAPILLARY: 100 mg/dL — AB (ref 70–99)

## 2018-01-18 MED ORDER — FUROSEMIDE 20 MG PO TABS: 10.0000 mg | ORAL_TABLET | Freq: Every day | ORAL | 1 refills | Status: DC

## 2018-01-18 NOTE — Progress Notes (Signed)
Patient discharged in stable condition with all belongings and daughter at bedside. They both verbalized understanding of all discharge instructions and importance of follow up visits.

## 2018-01-18 NOTE — Care Management Note (Signed)
Case Management Note  Patient Details  Name: Jamie [name] MRN: [id number] Date of Birth: 09/16/1922  Subjective/Objective:      Pt admitted with falls and subdural hematoma with femur fracture. She is from home alone.  Family is going to have her stay with them so they can provide 24 hours supervision and support.  Daughter: Carmin Muskrat: [phone] Daisy in [place], [place] [address] Pt on home oxygen. She uses [facility] for her oxygen. Family to pick up transport tank and move her concentrator to the daughters home.               Action/Plan: Pt discharging home with orders for [facility] services. CM provided choice. Family very interested in the [place] with [facility] notified and will speak with the daughter.  Pt with orders for rollator and 3 in 1. Butch Penny with [facility] DME notified and will deliver to the room. Family to provide transport home and will bring oxygen transport tank.    Expected Discharge Date:  01/18/18               Expected Discharge Plan:  [place]  In-House Referral:     Discharge planning Services  CM Consult  Post Acute Care Choice:  Home Health, Durable Medical Equipment Choice offered to:  Patient, Adult Children  DME Arranged:  3-N-1, Walker rolling with seat DME Agency:  [place]:  RN, PT, OT, Nurse's Aide [place] Agency:  [facility] Care(home first)  Status of Service:  Completed, signed off  If discussed at [place] of Stay Meetings, dates discussed:    Additional Comments:  Pollie Friar, RN 01/18/2018, 12:10 PM

## 2018-01-18 NOTE — Discharge Summary (Signed)
Physician Discharge Summary   Patient ID: Jamie [name] MRN: [id number] DOB/AGE: [birth year] 84 y.o.  Admit date: 01/16/2018 Discharge date: 01/18/2018  Primary Care Physician:  Crist Infante, MD   Recommendations for Outpatient Follow-up:  1. Follow up with PCP in 1-2 weeks 2. Patient recommended to follow-up with orthopedics as needed 3. Weightbearing as tolerated on the right lower extremity 4. Patient recommended against hip abduction which can cause pain  Home Health: Home health PT OT,  Equipment/Devices: DME 3 and 1 Rolling walker  Discharge Condition: stable CODE STATUS: DNR Diet recommendation: Regular diet   Discharge Diagnoses:    . Subdural hematoma ([place]) . Pacemaker-[employer] . Ischemic cardiomyopathy . COPD (chronic obstructive pulmonary disease) with emphysema ([place]) . Chronic combined systolic and diastolic heart failure, NYHA class 2 ([place]) . AV BLOCK, COMPLETE Right greater trochanter fracture Presyncope with mechanical fall Acute on chronic hypoxic respiratory failure secondary to COPD exacerbation  Consults: Neurosurgery Orthopedic    Allergies:   Allergies  Allergen Reactions  . Ace Inhibitors Cough  . Hydrocodone-Acetaminophen Other (See Comments)     GI distress     DISCHARGE MEDICATIONS: Allergies as of 01/18/2018      Reactions   Ace Inhibitors Cough   Hydrocodone-acetaminophen Other (See Comments)    GI distress      Medication List    STOP taking these medications   aspirin EC 81 MG tablet   isosorbide mononitrate 30 MG 24 hr tablet Commonly known as:  IMDUR     TAKE these medications   ALPRAZolam 0.5 MG tablet Commonly known as:  XANAX Take 0.5 mg by mouth at bedtime as needed for sleep.   beta carotene w/minerals tablet Take 1 tablet by mouth daily.   CALTRATE 600 1500 (600 Ca) MG Tabs tablet Generic drug:  calcium carbonate Take 600 mg by mouth daily.   cholecalciferol 1000 units tablet Commonly known as:   VITAMIN D Take 1,000 Units by mouth daily.   CO Q 10 PO Take 1 capsule by mouth daily.   cyanocobalamin 1000 MCG tablet Take 1,000 mcg by mouth daily.   furosemide 20 MG tablet Commonly known as:  LASIX Take 0.5 tablets (10 mg total) by mouth daily. What changed:  how much to take   levalbuterol 0.63 MG/3ML nebulizer solution Commonly known as:  XOPENEX Take 0.63 mg by nebulization every 6 (six) hours as needed for shortness of breath.   losartan 25 MG tablet Commonly known as:  COZAAR Take 1 tablet (25 mg total) by mouth daily. Please keep upcoming appt for future refills. Thank you   Magnesium 250 MG Tabs Take 250 mg by mouth daily.   nitroGLYCERIN 0.4 MG SL tablet Commonly known as:  NITROSTAT Place 1 tablet (0.4 mg total) under the tongue every 5 (five) minutes as needed for chest pain.   pantoprazole 40 MG tablet Commonly known as:  PROTONIX Take 40 mg by mouth daily.   potassium chloride 10 MEQ tablet Commonly known as:  K-DUR Take 10 mEq by mouth 2 (two) times a week. Tuesday and Friday   PROAIR HFA 108 (90 Base) MCG/ACT inhaler Generic drug:  albuterol Inhale 2 puffs into the lungs every 6 (six) hours as needed for wheezing or shortness of breath.   SYSTANE 0.4-0.3 % Soln Generic drug:  Polyethyl Glycol-Propyl Glycol Place 1 drop into both eyes 2 (two) times daily as needed (dry eyes).            Durable Medical Equipment  (  From admission, onward)         Start     Ordered   01/18/18 1012  For home use only DME 4 wheeled rolling walker with seat  Once    Question:  Patient needs a walker to treat with the following condition  Answer:  Gait instability   01/18/18 1011   01/18/18 1011  For home use only DME 3 n 1  Once     01/18/18 1010           Brief H and P: For complete details please refer to admission H and P, but in brief84 y.o.femalewithhistory of COPD, HTN, and chronic combined systolic and diastolic CHF who was brought to the ER  after she was found to have right-sided weakness following a fall. The pt was not able to recall the exact circumstances of her fall.    In the ER CT head showed left-sided subdural hematoma and Dr. Vertell Limber was consulted.   Hospital Course:   Subdural hematoma  CT head showed left-sided subdural hematoma. Neurosurgery was consulted, patient was seen by Dr. Vertell Limber, patient  does not wish to consider surgery even if it progressed to the point that it was indicated. Goals of care were discussed, patient did elected to have DNR, do not scan, do not operate and was treated medically. Aspirin discontinued  Fall of unclear etiology versus presyncopal Unclear if patient had a syncope versus mechanical fall with concussion. Prior clinical records indicated history of presyncope and syncope in the past and had been followed by Dr. Caryl Comes, during last EP clinic visit, had discussed about possible loop recorder. Patient was seen by cardiology while inpatient by Dr. Rayann Heman who felt that PPM interrogation was normal and her syncope was secondary to hypotension Recommended decrease Lasix to 10 mg daily at the time of discharge and stop Imdur.  Patient was also provided with gentle hydration.  Acute on chronic respiratory failure with hypoxia, secondary to COPD At the time of admission, patient had presented with acute respiratory failure, was placed on BiPAP and nebulizer treatment Currently back to baseline, O2 sats 98% on 2 L, no wheezing.  Fracture of right femur greater tuberosity Currently no pain, ambulating with PT Orthopedics was consulted, recommended no surgical intervention, continue PT, weightbearing as tolerated Outpatient follow-up with orthopedics  Day of Discharge S: No acute issues, wants to go home.  No fevers or wheezing or shortness of breath.  BP 133/85 (BP Location: Right Arm)   Pulse 76   Temp 98.1 F (36.7 C) (Oral)   Resp (!) 27   Wt 46.8 kg   SpO2 95%   BMI 18.28 kg/m    Physical Exam: General: Alert and awake oriented x3 not in any acute distress. HEENT: anicteric sclera, pupils reactive to light and accommodation CVS: S1-S2 clear no murmur rubs or gallops Chest: clear to auscultation bilaterally, no wheezing rales or rhonchi Abdomen: soft nontender, nondistended, normal bowel sounds Extremities: no cyanosis, clubbing or edema noted bilaterally Neuro: Cranial nerves II-XII intact, no focal neurological deficits   The results of significant diagnostics from this hospitalization (including imaging, microbiology, ancillary and laboratory) are listed below for reference.      Procedures/Studies:  Dg Chest 2 View  Result Date: 01/08/2018 CLINICAL DATA:  Shortness of breath. EXAM: CHEST - 2 VIEW COMPARISON:  03/29/2007 FINDINGS: 18 the cardiac silhouette, mediastinal and hilar contours are stable. There is tortuosity and calcification of the thoracic aorta. The pacer wires are stable. Stable  underlying emphysematous changes and pulmonary scarring. No acute overlying pulmonary process such as edema, infiltrates or effusions. The bony thorax is intact. IMPRESSION: Chronic emphysematous changes but no acute overlying pulmonary findings. Electronically Signed   By: Marijo Sanes M.D.   On: 01/08/2018 12:29   Ct Head Wo Contrast  Result Date: 01/16/2018 CLINICAL DATA:  Fall. Initial encounter. EXAM: CT HEAD WITHOUT CONTRAST CT CERVICAL SPINE WITHOUT CONTRAST TECHNIQUE: Multidetector CT imaging of the head and cervical spine was performed following the standard protocol without intravenous contrast. Multiplanar CT image reconstructions of the cervical spine were also generated. COMPARISON:  Head CT 08/05/2014. Cervical spine radiographs 08/08/2003. FINDINGS: CT HEAD FINDINGS Brain: A variably hyperattenuating subdural hematoma extending diffusely over the left cerebral convexity measures up to 11 mm in thickness. There is slight left cerebral hemispheric sulcal  effacement without midline shift. There is no evidence of acute infarct. Mild cerebral atrophy is noted. Vascular: Calcified atherosclerosis at the skull base. No hyperdense vessel. Skull: No fracture or focal osseous lesion. Sinuses/Orbits: Paranasal sinuses and mastoid air cells are clear. Bilateral cataract extraction. Other: None. CT CERVICAL SPINE FINDINGS Alignment: Slight reversal of the normal cervical lordosis. Chronic grade 1 anterolisthesis of C4 on C5. Skull base and vertebrae: No acute fracture or destructive osseous lesion. Asymmetric, advanced left-sided C1-2 arthropathy with small erosions. Soft tissues and spinal canal: No prevertebral fluid or swelling. No visible canal hematoma. Disc levels: Ankylosis across the C5-6 disc space and posterior elements. Moderate to severe disc space narrowing at C3-4, C4-5, and C6-7. Moderate multilevel facet arthrosis. Moderate left neural foraminal stenosis at C3-4. Upper chest: Mild biapical pleuroparenchymal scarring. Centrilobular emphysema. Other: Diffuse thyroid heterogeneity with multiple small nodules of varying attenuation and calcification. Mild calcific carotid artery atherosclerosis. IMPRESSION: 1. Acute left subdural hematoma measuring 11 mm in thickness. No midline shift. 2. No evidence of acute cervical spine fracture or traumatic subluxation. Critical Value/emergent results were called by telephone at the time of interpretation on 01/16/2018 at 2:56 am to Dr. Delora Fuel, who verbally acknowledged these results. Electronically Signed   By: Logan Bores M.D.   On: 01/16/2018 02:58   Ct Cervical Spine Wo Contrast  Result Date: 01/16/2018 CLINICAL DATA:  Fall. Initial encounter. EXAM: CT HEAD WITHOUT CONTRAST CT CERVICAL SPINE WITHOUT CONTRAST TECHNIQUE: Multidetector CT imaging of the head and cervical spine was performed following the standard protocol without intravenous contrast. Multiplanar CT image reconstructions of the cervical spine were  also generated. COMPARISON:  Head CT 08/05/2014. Cervical spine radiographs 08/08/2003. FINDINGS: CT HEAD FINDINGS Brain: A variably hyperattenuating subdural hematoma extending diffusely over the left cerebral convexity measures up to 11 mm in thickness. There is slight left cerebral hemispheric sulcal effacement without midline shift. There is no evidence of acute infarct. Mild cerebral atrophy is noted. Vascular: Calcified atherosclerosis at the skull base. No hyperdense vessel. Skull: No fracture or focal osseous lesion. Sinuses/Orbits: Paranasal sinuses and mastoid air cells are clear. Bilateral cataract extraction. Other: None. CT CERVICAL SPINE FINDINGS Alignment: Slight reversal of the normal cervical lordosis. Chronic grade 1 anterolisthesis of C4 on C5. Skull base and vertebrae: No acute fracture or destructive osseous lesion. Asymmetric, advanced left-sided C1-2 arthropathy with small erosions. Soft tissues and spinal canal: No prevertebral fluid or swelling. No visible canal hematoma. Disc levels: Ankylosis across the C5-6 disc space and posterior elements. Moderate to severe disc space narrowing at C3-4, C4-5, and C6-7. Moderate multilevel facet arthrosis. Moderate left neural foraminal stenosis at C3-4. Upper chest:  Mild biapical pleuroparenchymal scarring. Centrilobular emphysema. Other: Diffuse thyroid heterogeneity with multiple small nodules of varying attenuation and calcification. Mild calcific carotid artery atherosclerosis. IMPRESSION: 1. Acute left subdural hematoma measuring 11 mm in thickness. No midline shift. 2. No evidence of acute cervical spine fracture or traumatic subluxation. Critical Value/emergent results were called by telephone at the time of interpretation on 01/16/2018 at 2:56 am to Dr. Delora Fuel, who verbally acknowledged these results. Electronically Signed   By: Logan Bores M.D.   On: 01/16/2018 02:58   Dg Pelvis Portable  Result Date: 01/16/2018 CLINICAL DATA:  Status  post fall yesterday. Now with right-sided pain EXAM: PORTABLE PELVIS 1-2 VIEWS COMPARISON:  Abdominal radiograph of November 04, 2010 which included much of the right hip. FINDINGS: The bones are osteopenic. There is some fragmentation of the greater tuberosity of the femur. No intertrochanteric or sub trochanteric fracture is observed. The femoral neck and head are intact. There is mild asymmetric narrowing of the right hip joint space. The observed portions of the pelvis appear intact. IMPRESSION: Fracture of the superolateral aspect of the greater tuberosity of the right femur. The remainder of the visualized portions of the right femur is intact. Mild osteoarthritic change of the right hip. Electronically Signed   By: David  Martinique M.D.   On: 01/16/2018 07:30   Dg Chest Port 1 View  Result Date: 01/16/2018 CLINICAL DATA:  Shortness of breath.  Nose bleed. EXAM: PORTABLE CHEST 1 VIEW COMPARISON:  01/08/2018 FINDINGS: The lungs are hyperinflated with diffuse interstitial prominence. No focal airspace consolidation or pulmonary edema. No pleural effusion or pneumothorax. Normal cardiomediastinal contours. Left chest wall pacemaker with unchanged position of leads. IMPRESSION: COPD without acute airspace disease. Electronically Signed   By: Ulyses Jarred M.D.   On: 01/16/2018 02:25      LAB RESULTS: Basic Metabolic Panel: Recent Labs  Lab 01/16/18 0544 01/17/18 0350  NA 136 136  K 4.3 3.9  CL 100 97*  CO2 29 30  GLUCOSE 134* 100*  BUN 12 16  CREATININE 0.85 0.86  CALCIUM 8.9 8.7*   Liver Function Tests: Recent Labs  Lab 01/16/18 0544 01/17/18 0350  AST 22 24  ALT 20 18  ALKPHOS 66 55  BILITOT 1.2 1.1  PROT 6.5 5.7*  ALBUMIN 3.5 3.0*   No results for input(s): LIPASE, AMYLASE in the last 168 hours. No results for input(s): AMMONIA in the last 168 hours. CBC: Recent Labs  Lab 01/16/18 0544 01/17/18 0350  WBC 6.8 14.2*  NEUTROABS 6.4  --   HGB 13.4 12.7  HCT 40.6 40.2  MCV  96.4 98.0  PLT 163 164   Cardiac Enzymes: Recent Labs  Lab 01/16/18 1131 01/16/18 1920  TROPONINI 0.05* 0.07*   BNP: Invalid input(s): POCBNP CBG: Recent Labs  Lab 01/17/18 2150 01/18/18 0608  GLUCAP 84 100*      Disposition and Follow-up: Discharge Instructions    (HEART FAILURE PATIENTS) Call MD:  Anytime you have any of the following symptoms: 1) 3 pound weight gain in 24 hours or 5 pounds in 1 week 2) shortness of breath, with or without a dry hacking cough 3) swelling in the hands, feet or stomach 4) if you have to sleep on extra pillows at night in order to breathe.   Complete by:  As directed    Diet - low sodium heart healthy   Complete by:  As directed    Discharge instructions   Complete by:  As directed  Weightbearing as tolerated on the right lower extremity If pain becomes excessively worse and difficulty ambulating, please return to the ER otherwise follow-up with orthopedics in next 2 weeks   Increase activity slowly   Complete by:  As directed    Increase activity slowly   Complete by:  As directed        DISPOSITION: Home with home health   DISCHARGE FOLLOW-UP Follow-up Information    Crist Infante, MD. Schedule an appointment as soon as possible for a visit in 2 week(s).   Specialty:  Internal Medicine Contact information: [place] [address] [phone]        Deboraha Sprang, MD Follow up on 01/30/2018.   Specialty:  Cardiology Why:  at 9:30am Contact information: [place]. [address] [place] [address] [phone]        Erline Levine, MD. Schedule an appointment as soon as possible for a visit in 2 week(s).   Specialty:  Neurosurgery Why:  for follow-up as needed if worsening dizzyness, headache, blurry vision Contact information: [address] [place] [address] [phone]        Mcarthur Rossetti, MD. Schedule an appointment as soon as possible for a visit  in 2 week(s).   Specialty:  Orthopedic Surgery Why:   Or Dr Erlinda Hong, appt in 2-3 weeks   Contact information: [place] [address] [phone]            Time coordinating discharge:  40 minutes  Signed:   Estill Cotta M.D. [name] 01/18/2018, 12:55 PM Pager: [phone]

## 2018-01-18 NOTE — Consult Note (Signed)
Reason for Consult:Right greater troch fx Referring Physician: R Rai  Jamie [name] is an 84 y.o. female.  HPI: Jamie [name] was at home in her kitchen and fell, either 2/2 syncopal event or she suffered a concussion in the fall but does not remember it. She managed to crawl to the bedroom once she came to and call for help. At that time she had right sided paralysis which has since resolved. She came to the ED and was admitted. She had a pelvic x-ray (unsure why as I don't see any evidence of indication) which showed a greater trochanter fx and orthopedic surgery was consulted. She denies pain with movement or ambulation.  Past Medical History:  Diagnosis Date  . Asthma   . Carotid stenosis    a. Carotid US (9/15):  Bilateral ICA 1-39%  . Chronic combined systolic and diastolic CHF (congestive heart failure) ([place])    a. EF 40-45% in 11/2013, improved to 55-60% in 05/2016.  Marland Kitchen Complete AV block ([place])   . COPD (chronic obstructive pulmonary disease) ([place])   . Family history of anesthesia complication    SISTER HAS DIFFICULTY WAKING  . GERD (gastroesophageal reflux disease)   . Hypertension   . Ischemic cardiomyopathy    a.  Echo (9/15):  EF 40-45%, ant and apical and inferoapical HK, Gr 1 DD, trivial MR, mild TR, PASP 23 mmHg;  b. Myoview (9/15) large scar in mid LAD territory, no peri-infarct ischemia, EF 60%, apical AK; intermediate risk >> patient opted for med Rx. b. EF 55-60% by echo 05/2016.  . Orthostasis   . Osteoarthritis   . Osteopenia   . Pacemaker   . Pneumonia   . Pre-syncope   . Presence of permanent cardiac pacemaker   . Shortness of breath   . Tachycardia   . TIA (transient ischemic attack)    a. possible TIA 11/2013.  Marland Kitchen UTI (urinary tract infection)     Past Surgical History:  Procedure Laterality Date  . CHOLECYSTECTOMY    . EYE SURGERY  10/2004  . HERNIA REPAIR  11/2002  . PACEMAKER PLACEMENT     Medtronic    Family History  Problem Relation Age of Onset  . Heart  disease Sister   . Breast cancer Sister     Social History:  reports that she quit smoking about 30 years ago. Her smoking use included cigarettes. She has a 35.00 pack-year smoking history. She has never used smokeless tobacco. She reports that she does not drink alcohol or use drugs.  Allergies:  Allergies  Allergen Reactions  . Ace Inhibitors Cough  . Hydrocodone-Acetaminophen Other (See Comments)     GI distress    Medications: I have reviewed the patient's current medications.  Results for orders placed or performed during the hospital encounter of 01/16/18 (from the past 48 hour(s))  Troponin I     Status: Abnormal   Collection Time: 01/16/18  7:20 PM  Result Value Ref Range   Troponin I 0.07 (HH) <0.03 ng/mL    Comment: CRITICAL VALUE NOTED.  VALUE IS CONSISTENT WITH PREVIOUSLY REPORTED AND CALLED VALUE. Performed at [facility], [place] [address], [place],  [address]   Comprehensive metabolic panel     Status: Abnormal   Collection Time: 01/17/18  3:50 AM  Result Value Ref Range   Sodium 136 135 - 145 mmol/L   Potassium 3.9 3.5 - 5.1 mmol/L   Chloride 97 (L) 98 - 111 mmol/L   CO2 30  22 - 32 mmol/L   Glucose, Bld 100 (H) 70 - 99 mg/dL   BUN 16 8 - 23 mg/dL   Creatinine, Ser 0.86 0.44 - 1.00 mg/dL   Calcium 8.7 (L) 8.9 - 10.3 mg/dL   Total Protein 5.7 (L) 6.5 - 8.1 g/dL   Albumin 3.0 (L) 3.5 - 5.0 g/dL   AST 24 15 - 41 U/L   ALT 18 0 - 44 U/L   Alkaline Phosphatase 55 38 - 126 U/L   Total Bilirubin 1.1 0.3 - 1.2 mg/dL   GFR calc non Af Amer 56 (L) >60 mL/min   GFR calc Af Amer >60 >60 mL/min    Comment: (NOTE) The eGFR has been calculated using the CKD EPI equation. This calculation has not been validated in all clinical situations. eGFR's persistently <60 mL/min signify possible Chronic Kidney Disease.    Anion gap 9 5 - 15    Comment: Performed at [facility] [address], [place], [place] [address]  CBC     Status: Abnormal    Collection Time: 01/17/18  3:50 AM  Result Value Ref Range   WBC 14.2 (H) 4.0 - 10.5 K/uL   RBC 4.10 3.87 - 5.11 MIL/uL   Hemoglobin 12.7 12.0 - 15.0 g/dL   HCT 40.2 36.0 - 46.0 %   MCV 98.0 80.0 - 100.0 fL   MCH 31.0 26.0 - 34.0 pg   MCHC 31.6 30.0 - 36.0 g/dL   RDW 12.8 11.5 - 15.5 %   Platelets 164 150 - 400 K/uL   nRBC 0.0 0.0 - 0.2 %    Comment: Performed at [facility], [place] [address], [place], [place] [address]  TSH     Status: None   Collection Time: 01/17/18  3:50 AM  Result Value Ref Range   TSH 0.963 0.350 - 4.500 uIU/mL    Comment: Performed by a 3rd Generation assay with a functional sensitivity of <=0.01 uIU/mL. Performed at [facility], [place] [address], [place], [place] [address]   Glucose, capillary     Status: None   Collection Time: 01/17/18  7:58 AM  Result Value Ref Range   Glucose-Capillary 82 70 - 99 mg/dL  Glucose, capillary     Status: None   Collection Time: 01/17/18  4:30 PM  Result Value Ref Range   Glucose-Capillary 91 70 - 99 mg/dL   Comment 1 Notify RN    Comment 2 Document in Chart   Glucose, capillary     Status: None   Collection Time: 01/17/18  9:50 PM  Result Value Ref Range   Glucose-Capillary 84 70 - 99 mg/dL   Comment 1 Notify RN    Comment 2 Document in Chart   Glucose, capillary     Status: Abnormal   Collection Time: 01/18/18  6:08 AM  Result Value Ref Range   Glucose-Capillary 100 (H) 70 - 99 mg/dL   Comment 1 Notify RN    Comment 2 Document in Chart     No results found.  Review of Systems  Constitutional: Negative for weight loss.  HENT: Negative for ear discharge, ear pain, hearing loss and tinnitus.   Eyes: Negative for blurred vision, double vision, photophobia and pain.  Respiratory: Negative for cough, sputum production and shortness of breath.   Cardiovascular: Negative for chest pain.  Gastrointestinal: Negative for abdominal pain, nausea and vomiting.  Genitourinary: Negative for dysuria, flank pain,  frequency and urgency.  Musculoskeletal: Negative  for back pain, falls, joint pain, myalgias and neck pain.  Neurological: Positive for headaches. Negative for dizziness, tingling, sensory change, focal weakness and loss of consciousness.  Endo/Heme/Allergies: Does not bruise/bleed easily.  Psychiatric/Behavioral: Negative for depression, memory loss and substance abuse. The patient is not nervous/anxious.    Blood pressure 133/85, pulse 76, temperature 98.1 F (36.7 C), temperature source Oral, resp. rate (!) 27, weight 46.8 kg, SpO2 95 %. Physical Exam  Constitutional: She appears well-developed and well-nourished. No distress.  HENT:  Head: Normocephalic and atraumatic.  Eyes: Conjunctivae are normal. Right eye exhibits no discharge. Left eye exhibits no discharge. No scleral icterus.  Neck: Normal range of motion.  Cardiovascular: Normal rate and regular rhythm.  Respiratory: Effort normal. No respiratory distress.  Musculoskeletal:  RLE No traumatic wounds, ecchymosis, or rash  Minimal TTP greater trochanter  No knee or ankle effusion  Knee stable to varus/ valgus and anterior/posterior stress  Sens DPN, SPN, TN intact  Motor EHL, ext, flex, evers 5/5  DP 2+, PT 1+, No significant edema  Neurological: She is alert.  Skin: Skin is warm and dry. She is not diaphoretic.  Psychiatric: She has a normal mood and affect. Her behavior is normal.    Assessment/Plan: Right greater trochanter fx -- She may be WBAT on RLE. I don't foresee any issues arising from this fx though did caution against hip abduction if it causes pain. She may f/u with [facility] on prn basis if the hip becomes painful. Multiple medical problems including COPD, CHF, and HTN in addition to possible syncope and frequent falls    Lisette Abu, PA-C Orthopedic Surgery [phone] 01/18/2018, 12:25 PM

## 2018-01-19 ENCOUNTER — Other Ambulatory Visit: Payer: Self-pay | Admitting: *Deleted

## 2018-01-19 DIAGNOSIS — Z87440 Personal history of urinary (tract) infections: Secondary | ICD-10-CM | POA: Diagnosis not present

## 2018-01-19 DIAGNOSIS — M1990 Unspecified osteoarthritis, unspecified site: Secondary | ICD-10-CM | POA: Diagnosis not present

## 2018-01-19 DIAGNOSIS — M452 Ankylosing spondylitis of cervical region: Secondary | ICD-10-CM | POA: Diagnosis not present

## 2018-01-19 DIAGNOSIS — K219 Gastro-esophageal reflux disease without esophagitis: Secondary | ICD-10-CM | POA: Diagnosis not present

## 2018-01-19 DIAGNOSIS — M4312 Spondylolisthesis, cervical region: Secondary | ICD-10-CM | POA: Diagnosis not present

## 2018-01-19 DIAGNOSIS — Z9981 Dependence on supplemental oxygen: Secondary | ICD-10-CM | POA: Diagnosis not present

## 2018-01-19 DIAGNOSIS — J9621 Acute and chronic respiratory failure with hypoxia: Secondary | ICD-10-CM | POA: Diagnosis not present

## 2018-01-19 DIAGNOSIS — E040 Nontoxic diffuse goiter: Secondary | ICD-10-CM | POA: Diagnosis not present

## 2018-01-19 DIAGNOSIS — Z8673 Personal history of transient ischemic attack (TIA), and cerebral infarction without residual deficits: Secondary | ICD-10-CM | POA: Diagnosis not present

## 2018-01-19 DIAGNOSIS — J432 Centrilobular emphysema: Secondary | ICD-10-CM | POA: Diagnosis not present

## 2018-01-19 DIAGNOSIS — I672 Cerebral atherosclerosis: Secondary | ICD-10-CM | POA: Diagnosis not present

## 2018-01-19 DIAGNOSIS — S72111D Displaced fracture of greater trochanter of right femur, subsequent encounter for closed fracture with routine healing: Secondary | ICD-10-CM | POA: Diagnosis not present

## 2018-01-19 DIAGNOSIS — M4802 Spinal stenosis, cervical region: Secondary | ICD-10-CM | POA: Diagnosis not present

## 2018-01-19 DIAGNOSIS — M8580 Other specified disorders of bone density and structure, unspecified site: Secondary | ICD-10-CM | POA: Diagnosis not present

## 2018-01-19 DIAGNOSIS — I110 Hypertensive heart disease with heart failure: Secondary | ICD-10-CM | POA: Diagnosis not present

## 2018-01-19 DIAGNOSIS — I442 Atrioventricular block, complete: Secondary | ICD-10-CM | POA: Diagnosis not present

## 2018-01-19 DIAGNOSIS — I252 Old myocardial infarction: Secondary | ICD-10-CM | POA: Diagnosis not present

## 2018-01-19 DIAGNOSIS — I6521 Occlusion and stenosis of right carotid artery: Secondary | ICD-10-CM | POA: Diagnosis not present

## 2018-01-19 DIAGNOSIS — I255 Ischemic cardiomyopathy: Secondary | ICD-10-CM | POA: Diagnosis not present

## 2018-01-19 DIAGNOSIS — S065X9D Traumatic subdural hemorrhage with loss of consciousness of unspecified duration, subsequent encounter: Secondary | ICD-10-CM | POA: Diagnosis not present

## 2018-01-19 DIAGNOSIS — Z87891 Personal history of nicotine dependence: Secondary | ICD-10-CM | POA: Diagnosis not present

## 2018-01-19 DIAGNOSIS — Z950 Presence of cardiac pacemaker: Secondary | ICD-10-CM | POA: Diagnosis not present

## 2018-01-19 DIAGNOSIS — I081 Rheumatic disorders of both mitral and tricuspid valves: Secondary | ICD-10-CM | POA: Diagnosis not present

## 2018-01-19 DIAGNOSIS — Z8701 Personal history of pneumonia (recurrent): Secondary | ICD-10-CM | POA: Diagnosis not present

## 2018-01-19 DIAGNOSIS — I5042 Chronic combined systolic (congestive) and diastolic (congestive) heart failure: Secondary | ICD-10-CM | POA: Diagnosis not present

## 2018-01-19 DIAGNOSIS — I700 Atherosclerosis of aorta: Secondary | ICD-10-CM | POA: Diagnosis not present

## 2018-01-19 NOTE — Patient Outreach (Signed)
Made aware of patient's enrollment with [place] program by [facility] with [facility].  Attempted to reach patient's daughter, Frances Furbish, via phone at [phone] to make aware that [facility] [employer] could potential assist with transportation needs to MD appointment. However, no answer. HIPPA compliant voicemail message left.  Will send notification to [facility] [employer] office to make aware of patient's [facility] enrollment.   Marthenia Rolling, MSN-Ed, RN,BSN [facility] Liaison [phone]

## 2018-01-20 DIAGNOSIS — I255 Ischemic cardiomyopathy: Secondary | ICD-10-CM | POA: Diagnosis not present

## 2018-01-20 DIAGNOSIS — J432 Centrilobular emphysema: Secondary | ICD-10-CM | POA: Diagnosis not present

## 2018-01-20 DIAGNOSIS — S72111D Displaced fracture of greater trochanter of right femur, subsequent encounter for closed fracture with routine healing: Secondary | ICD-10-CM | POA: Diagnosis not present

## 2018-01-20 DIAGNOSIS — I5042 Chronic combined systolic (congestive) and diastolic (congestive) heart failure: Secondary | ICD-10-CM | POA: Diagnosis not present

## 2018-01-20 DIAGNOSIS — I110 Hypertensive heart disease with heart failure: Secondary | ICD-10-CM | POA: Diagnosis not present

## 2018-01-20 DIAGNOSIS — S065X9D Traumatic subdural hemorrhage with loss of consciousness of unspecified duration, subsequent encounter: Secondary | ICD-10-CM | POA: Diagnosis not present

## 2018-01-24 DIAGNOSIS — I5042 Chronic combined systolic (congestive) and diastolic (congestive) heart failure: Secondary | ICD-10-CM | POA: Diagnosis not present

## 2018-01-24 DIAGNOSIS — I255 Ischemic cardiomyopathy: Secondary | ICD-10-CM | POA: Diagnosis not present

## 2018-01-24 DIAGNOSIS — I110 Hypertensive heart disease with heart failure: Secondary | ICD-10-CM | POA: Diagnosis not present

## 2018-01-24 DIAGNOSIS — S065X9D Traumatic subdural hemorrhage with loss of consciousness of unspecified duration, subsequent encounter: Secondary | ICD-10-CM | POA: Diagnosis not present

## 2018-01-24 DIAGNOSIS — J432 Centrilobular emphysema: Secondary | ICD-10-CM | POA: Diagnosis not present

## 2018-01-24 DIAGNOSIS — S72111D Displaced fracture of greater trochanter of right femur, subsequent encounter for closed fracture with routine healing: Secondary | ICD-10-CM | POA: Diagnosis not present

## 2018-01-26 DIAGNOSIS — I5042 Chronic combined systolic (congestive) and diastolic (congestive) heart failure: Secondary | ICD-10-CM | POA: Diagnosis not present

## 2018-01-26 DIAGNOSIS — I110 Hypertensive heart disease with heart failure: Secondary | ICD-10-CM | POA: Diagnosis not present

## 2018-01-26 DIAGNOSIS — J432 Centrilobular emphysema: Secondary | ICD-10-CM | POA: Diagnosis not present

## 2018-01-26 DIAGNOSIS — S065X9D Traumatic subdural hemorrhage with loss of consciousness of unspecified duration, subsequent encounter: Secondary | ICD-10-CM | POA: Diagnosis not present

## 2018-01-26 DIAGNOSIS — S72111D Displaced fracture of greater trochanter of right femur, subsequent encounter for closed fracture with routine healing: Secondary | ICD-10-CM | POA: Diagnosis not present

## 2018-01-26 DIAGNOSIS — I255 Ischemic cardiomyopathy: Secondary | ICD-10-CM | POA: Diagnosis not present

## 2018-01-30 ENCOUNTER — Ambulatory Visit: Payer: Medicare Other | Admitting: Physician Assistant

## 2018-01-31 DIAGNOSIS — J432 Centrilobular emphysema: Secondary | ICD-10-CM | POA: Diagnosis not present

## 2018-01-31 DIAGNOSIS — S72111D Displaced fracture of greater trochanter of right femur, subsequent encounter for closed fracture with routine healing: Secondary | ICD-10-CM | POA: Diagnosis not present

## 2018-01-31 DIAGNOSIS — I5042 Chronic combined systolic (congestive) and diastolic (congestive) heart failure: Secondary | ICD-10-CM | POA: Diagnosis not present

## 2018-01-31 DIAGNOSIS — I255 Ischemic cardiomyopathy: Secondary | ICD-10-CM | POA: Diagnosis not present

## 2018-01-31 DIAGNOSIS — I110 Hypertensive heart disease with heart failure: Secondary | ICD-10-CM | POA: Diagnosis not present

## 2018-01-31 DIAGNOSIS — S065X9D Traumatic subdural hemorrhage with loss of consciousness of unspecified duration, subsequent encounter: Secondary | ICD-10-CM | POA: Diagnosis not present

## 2018-02-01 DIAGNOSIS — I5042 Chronic combined systolic (congestive) and diastolic (congestive) heart failure: Secondary | ICD-10-CM | POA: Diagnosis not present

## 2018-02-01 DIAGNOSIS — I110 Hypertensive heart disease with heart failure: Secondary | ICD-10-CM | POA: Diagnosis not present

## 2018-02-01 DIAGNOSIS — J432 Centrilobular emphysema: Secondary | ICD-10-CM | POA: Diagnosis not present

## 2018-02-01 DIAGNOSIS — S065X9D Traumatic subdural hemorrhage with loss of consciousness of unspecified duration, subsequent encounter: Secondary | ICD-10-CM | POA: Diagnosis not present

## 2018-02-01 DIAGNOSIS — I255 Ischemic cardiomyopathy: Secondary | ICD-10-CM | POA: Diagnosis not present

## 2018-02-01 DIAGNOSIS — S72111D Displaced fracture of greater trochanter of right femur, subsequent encounter for closed fracture with routine healing: Secondary | ICD-10-CM | POA: Diagnosis not present

## 2018-02-07 DIAGNOSIS — S065X9D Traumatic subdural hemorrhage with loss of consciousness of unspecified duration, subsequent encounter: Secondary | ICD-10-CM | POA: Diagnosis not present

## 2018-02-07 DIAGNOSIS — I255 Ischemic cardiomyopathy: Secondary | ICD-10-CM | POA: Diagnosis not present

## 2018-02-07 DIAGNOSIS — I110 Hypertensive heart disease with heart failure: Secondary | ICD-10-CM | POA: Diagnosis not present

## 2018-02-07 DIAGNOSIS — S72111D Displaced fracture of greater trochanter of right femur, subsequent encounter for closed fracture with routine healing: Secondary | ICD-10-CM | POA: Diagnosis not present

## 2018-02-07 DIAGNOSIS — J432 Centrilobular emphysema: Secondary | ICD-10-CM | POA: Diagnosis not present

## 2018-02-07 DIAGNOSIS — I5042 Chronic combined systolic (congestive) and diastolic (congestive) heart failure: Secondary | ICD-10-CM | POA: Diagnosis not present

## 2018-02-08 ENCOUNTER — Encounter (INDEPENDENT_AMBULATORY_CARE_PROVIDER_SITE_OTHER): Payer: Self-pay | Admitting: Orthopaedic Surgery

## 2018-02-08 ENCOUNTER — Ambulatory Visit (INDEPENDENT_AMBULATORY_CARE_PROVIDER_SITE_OTHER): Payer: Medicare Other | Admitting: Orthopaedic Surgery

## 2018-02-08 ENCOUNTER — Ambulatory Visit (INDEPENDENT_AMBULATORY_CARE_PROVIDER_SITE_OTHER): Payer: Medicare Other

## 2018-02-08 DIAGNOSIS — S72111D Displaced fracture of greater trochanter of right femur, subsequent encounter for closed fracture with routine healing: Secondary | ICD-10-CM | POA: Diagnosis not present

## 2018-02-08 DIAGNOSIS — S72114A Nondisplaced fracture of greater trochanter of right femur, initial encounter for closed fracture: Secondary | ICD-10-CM | POA: Insufficient documentation

## 2018-02-08 DIAGNOSIS — J432 Centrilobular emphysema: Secondary | ICD-10-CM | POA: Diagnosis not present

## 2018-02-08 DIAGNOSIS — I5042 Chronic combined systolic (congestive) and diastolic (congestive) heart failure: Secondary | ICD-10-CM | POA: Diagnosis not present

## 2018-02-08 DIAGNOSIS — I110 Hypertensive heart disease with heart failure: Secondary | ICD-10-CM | POA: Diagnosis not present

## 2018-02-08 DIAGNOSIS — M25551 Pain in right hip: Secondary | ICD-10-CM | POA: Diagnosis not present

## 2018-02-08 DIAGNOSIS — S065X9D Traumatic subdural hemorrhage with loss of consciousness of unspecified duration, subsequent encounter: Secondary | ICD-10-CM | POA: Diagnosis not present

## 2018-02-08 DIAGNOSIS — I255 Ischemic cardiomyopathy: Secondary | ICD-10-CM | POA: Diagnosis not present

## 2018-02-08 NOTE — Progress Notes (Signed)
Office Visit Note   Patient: Jamie [name]           Date of Birth: September 11, 1922           MRN: [id number] Visit Date: 02/08/2018              Requested by: Crist Infante, MD [address] [place], [place] [address] PCP: Crist Infante, MD   Assessment & Plan: Visit Diagnoses:  1. Nondisplaced fracture of greater trochanter of right femur, initial encounter for closed fracture [facility])     Plan: Impression is 3 weeks status post right greater trochanter fracture, date of injury 01/16/2018.  At this point, she will continue to work with physical therapy on range of motion and strengthening exercises, however she is to avoid active hip abduction for another 3 weeks.  She will follow-up with [place] in 3 weeks time for repeat evaluation and x-rays.  Follow-Up Instructions: Return in about 3 weeks (around 03/01/2018).   Orders:  Orders Placed This Encounter  Procedures  . XR HIP UNILAT W OR W/O PELVIS 2-3 VIEWS RIGHT   No orders of the defined types were placed in this encounter.     Procedures: No procedures performed   Clinical Data: No additional findings.   Subjective: Chief Complaint  Patient presents with  . Right Hip - Follow-up    HPI patient is a pleasant 84 year old female who presents to our clinic today for follow-up of her right hip injury.  On 01/16/2018, she blacked out falling onto her right hip.  She was seen in the ED where x-rays were obtained.  These showed a greater trochanter fracture on the right.  She has been ambulating with the use of a walker for the past few weeks.  She denies any significant pain, although she does admit to slight paralysis right lower extremity due to a subdural hematoma which she also sustained as a result of the fall.  She has been residing with her daughter and getting home health physical therapy where she feels she is regaining strength on a daily basis.  Overall doing better.   Review of Systems as detailed in HPI.  All others reviewed  and are negative.   Objective: Vital Signs: There were no vitals taken for this visit.  Physical Exam well-developed well-nourished female no acute distress.  Alert and oriented x3.  Ortho Exam examination of her right hip reveals 5 out of 10 tenderness to the greater trochanter upon palpation.  Full hip flexion with and without resistance.  No pain with logroll.  She is neurovascularly intact distally.  Specialty Comments:  No specialty comments available.  Imaging: Xr Hip Unilat W Or W/o Pelvis 2-3 Views Right  Result Date: 02/08/2018 X-rays demonstrate stable alignment of the fracture without further displacement    PMFS History: Patient Active Problem List   Diagnosis Date Noted  . Subdural hematoma ([place]) 01/16/2018  . GERD (gastroesophageal reflux disease) 01/09/2018  . CAD (coronary artery disease) 01/08/2018  . Chest pain 05/19/2016  . Chronic respiratory failure with hypoxia, on home O2 therapy ([place]) 05/19/2016  . History of LAD infarction 05/19/2016  . Fever 05/19/2016  . Chronic combined systolic and diastolic heart failure, NYHA class 2 ([place]) 05/19/2016  . Ischemic cardiomyopathy 05/19/2016  . Dyslipidemia   . Blurred vision 08/05/2014  . Anginal pain ([place]) 01/22/2014  . Elevated troponin 11/26/2013  . Acute diastolic congestive heart failure ([place]) 07/25/2012  . Elevated brain natriuretic peptide (BNP) level 07/21/2012  .  Orthostatic hypotension 08/16/2011  . Non-STEMI  possibly type II 11/17/2010  . VENTRICULAR TACHYCARDIA 09/29/2009  . HTN (hypertension) 06/06/2008  . AV BLOCK, COMPLETE 06/06/2008  . COPD (chronic obstructive pulmonary disease) with emphysema ([place]) 06/06/2008  . OSTEOARTHRITIS 06/06/2008  . OSTEOPENIA 06/06/2008  . TACHYCARDIA 06/06/2008  . Pacemaker-Medtronic 06/06/2008   Past Medical History:  Diagnosis Date  . Asthma   . Carotid stenosis    a. Carotid US (9/15):  Bilateral ICA 1-39%  . Chronic combined systolic and diastolic CHF  (congestive heart failure) ([place])    a. EF 40-45% in 11/2013, improved to 55-60% in 05/2016.  Marland Kitchen Complete AV block ([place])   . COPD (chronic obstructive pulmonary disease) ([place])   . Family history of anesthesia complication    SISTER HAS DIFFICULTY WAKING  . GERD (gastroesophageal reflux disease)   . Hypertension   . Ischemic cardiomyopathy    a.  Echo (9/15):  EF 40-45%, ant and apical and inferoapical HK, Gr 1 DD, trivial MR, mild TR, PASP 23 mmHg;  b. Myoview (9/15) large scar in mid LAD territory, no peri-infarct ischemia, EF 60%, apical AK; intermediate risk >> patient opted for med Rx. b. EF 55-60% by echo 05/2016.  . Orthostasis   . Osteoarthritis   . Osteopenia   . Pacemaker   . Pneumonia   . Pre-syncope   . Presence of permanent cardiac pacemaker   . Shortness of breath   . Tachycardia   . TIA (transient ischemic attack)    a. possible TIA 11/2013.  Marland Kitchen UTI (urinary tract infection)     Family History  Problem Relation Age of Onset  . Heart disease Sister   . Breast cancer Sister     Past Surgical History:  Procedure Laterality Date  . CHOLECYSTECTOMY    . EYE SURGERY  10/2004  . HERNIA REPAIR  11/2002  . PACEMAKER PLACEMENT     Medtronic   Social History   Occupational History  . Occupation: retired    Fish farm manager: RETIRED  Tobacco Use  . Smoking status: Former Smoker    Packs/day: 1.00    Years: 35.00    Pack years: 35.00    Types: Cigarettes    Last attempt to quit: 03/16/1987    Years since quitting: 30.9  . Smokeless tobacco: Never Used  Substance and Sexual Activity  . Alcohol use: No  . Drug use: No  . Sexual activity: Never    Birth control/protection: Post-menopausal

## 2018-02-16 DIAGNOSIS — H353122 Nonexudative age-related macular degeneration, left eye, intermediate dry stage: Secondary | ICD-10-CM | POA: Diagnosis not present

## 2018-02-16 DIAGNOSIS — H353211 Exudative age-related macular degeneration, right eye, with active choroidal neovascularization: Secondary | ICD-10-CM | POA: Diagnosis not present

## 2018-02-16 DIAGNOSIS — H353112 Nonexudative age-related macular degeneration, right eye, intermediate dry stage: Secondary | ICD-10-CM | POA: Diagnosis not present

## 2018-02-16 DIAGNOSIS — H353222 Exudative age-related macular degeneration, left eye, with inactive choroidal neovascularization: Secondary | ICD-10-CM | POA: Diagnosis not present

## 2018-02-21 DIAGNOSIS — I255 Ischemic cardiomyopathy: Secondary | ICD-10-CM | POA: Diagnosis not present

## 2018-02-21 DIAGNOSIS — S065X9D Traumatic subdural hemorrhage with loss of consciousness of unspecified duration, subsequent encounter: Secondary | ICD-10-CM | POA: Diagnosis not present

## 2018-02-21 DIAGNOSIS — J432 Centrilobular emphysema: Secondary | ICD-10-CM | POA: Diagnosis not present

## 2018-02-21 DIAGNOSIS — I110 Hypertensive heart disease with heart failure: Secondary | ICD-10-CM | POA: Diagnosis not present

## 2018-02-21 DIAGNOSIS — S72111D Displaced fracture of greater trochanter of right femur, subsequent encounter for closed fracture with routine healing: Secondary | ICD-10-CM | POA: Diagnosis not present

## 2018-02-21 DIAGNOSIS — I5042 Chronic combined systolic (congestive) and diastolic (congestive) heart failure: Secondary | ICD-10-CM | POA: Diagnosis not present

## 2018-02-26 ENCOUNTER — Emergency Department (HOSPITAL_COMMUNITY): Payer: Medicare Other

## 2018-02-26 ENCOUNTER — Other Ambulatory Visit: Payer: Self-pay

## 2018-02-26 ENCOUNTER — Emergency Department (HOSPITAL_COMMUNITY)
Admission: EM | Admit: 2018-02-26 | Discharge: 2018-02-26 | Disposition: A | Discharge status: Home / Self Care | Payer: Medicare Other | Attending: Emergency Medicine | Admitting: Emergency Medicine

## 2018-02-26 DIAGNOSIS — R0902 Hypoxemia: Secondary | ICD-10-CM | POA: Diagnosis not present

## 2018-02-26 DIAGNOSIS — Z79899 Other long term (current) drug therapy: Secondary | ICD-10-CM | POA: Insufficient documentation

## 2018-02-26 DIAGNOSIS — R42 Dizziness and giddiness: Secondary | ICD-10-CM | POA: Diagnosis not present

## 2018-02-26 DIAGNOSIS — R531 Weakness: Secondary | ICD-10-CM | POA: Diagnosis not present

## 2018-02-26 DIAGNOSIS — R197 Diarrhea, unspecified: Secondary | ICD-10-CM

## 2018-02-26 DIAGNOSIS — R110 Nausea: Secondary | ICD-10-CM | POA: Diagnosis not present

## 2018-02-26 DIAGNOSIS — I959 Hypotension, unspecified: Secondary | ICD-10-CM | POA: Diagnosis not present

## 2018-02-26 DIAGNOSIS — Z87891 Personal history of nicotine dependence: Secondary | ICD-10-CM | POA: Diagnosis not present

## 2018-02-26 DIAGNOSIS — J449 Chronic obstructive pulmonary disease, unspecified: Secondary | ICD-10-CM | POA: Diagnosis not present

## 2018-02-26 DIAGNOSIS — R61 Generalized hyperhidrosis: Secondary | ICD-10-CM | POA: Diagnosis not present

## 2018-02-26 DIAGNOSIS — R55 Syncope and collapse: Secondary | ICD-10-CM | POA: Diagnosis not present

## 2018-02-26 LAB — COMPREHENSIVE METABOLIC PANEL
ALBUMIN: 3.1 g/dL — AB (ref 3.5–5.0)
ALK PHOS: 61 U/L (ref 38–126)
ALT: 17 U/L (ref 0–44)
ANION GAP: 15 (ref 5–15)
AST: 17 U/L (ref 15–41)
BUN: 19 mg/dL (ref 8–23)
CALCIUM: 8 mg/dL — AB (ref 8.9–10.3)
CO2: 25 mmol/L (ref 22–32)
Chloride: 99 mmol/L (ref 98–111)
Creatinine, Ser: 0.96 mg/dL (ref 0.44–1.00)
GFR calc non Af Amer: 51 mL/min — ABNORMAL LOW (ref >60)
GFR, EST AFRICAN AMERICAN: 59 mL/min — AB (ref >60)
Glucose, Bld: 132 mg/dL — ABNORMAL HIGH (ref 70–99)
Potassium: 4 mmol/L (ref 3.5–5.1)
Sodium: 139 mmol/L (ref 135–145)
TOTAL PROTEIN: 5.6 g/dL — AB (ref 6.5–8.1)
Total Bilirubin: 0.6 mg/dL (ref 0.3–1.2)

## 2018-02-26 LAB — CBC WITH DIFFERENTIAL/PLATELET
Abs Immature Granulocytes: 0.05 10*3/uL (ref 0.00–0.07)
BASOS ABS: 0.1 10*3/uL (ref 0.0–0.1)
Basophils Relative: 1 %
EOS ABS: 0.3 10*3/uL (ref 0.0–0.5)
EOS PCT: 3 %
HCT: 37.4 % (ref 36.0–46.0)
HEMOGLOBIN: 11.3 g/dL — AB (ref 12.0–15.0)
Immature Granulocytes: 1 %
LYMPHS PCT: 16 %
Lymphs Abs: 1.4 10*3/uL (ref 0.7–4.0)
MCH: 30.7 pg (ref 26.0–34.0)
MCHC: 30.2 g/dL (ref 30.0–36.0)
MCV: 101.6 fL — ABNORMAL HIGH (ref 80.0–100.0)
MONO ABS: 0.8 10*3/uL (ref 0.1–1.0)
Monocytes Relative: 9 %
NRBC: 0 % (ref 0.0–0.2)
Neutro Abs: 6 10*3/uL (ref 1.7–7.7)
Neutrophils Relative %: 70 %
Platelets: 218 10*3/uL (ref 150–400)
RBC: 3.68 MIL/uL — ABNORMAL LOW (ref 3.87–5.11)
RDW: 13 % (ref 11.5–15.5)
WBC: 8.6 10*3/uL (ref 4.0–10.5)

## 2018-02-26 LAB — TROPONIN I: Troponin I: <0.03 ng/mL (ref <0.03)

## 2018-02-26 NOTE — ED Notes (Signed)
Patient has Medtronic pacemaker which has already been interrogated.

## 2018-02-26 NOTE — ED Triage Notes (Signed)
Per EMS, family states that pt was sitting in a chair and had a syncopal episode while sitting. When she awoke, family gave her 3 SL nitro q5 minutes. When EMS arrived, patient's pressure was in the '70s. EMS started a 16 ga in the RAC and gave a 500cc bolus. Patient alert and oriented x3 and NAD.

## 2018-02-26 NOTE — ED Provider Notes (Addendum)
[facility] Provider Note   CSN: [id number] Arrival date & time: 02/26/18  2132     History   Chief Complaint Chief Complaint  Patient presents with  . Loss of Consciousness    HPI Jamie [name] is a 84 y.o. female.  HPI Patient has had several days of loose stools.  States she was sitting in the chair after dinner talking to family.  She became lightheaded and nauseated.  Had a witnessed syncopal episode.  No trauma.  Was given 3 sublingual nitroglycerin by family.  EMS was called.  Her patient's blood pressure was in the 70s when they arrived.  Given IV fluids.  Patient states she is feeling much better currently.  Complains of mild posterior headache.  Denies chest pain at any point.  No shortness of breath.  No new lower extremity swelling or pain.  Patient does have Medtronic pacemaker in place.  Past Medical History:  Diagnosis Date  . Asthma   . Carotid stenosis    a. Carotid US (9/15):  Bilateral ICA 1-39%  . Chronic combined systolic and diastolic CHF (congestive heart failure) ([place])    a. EF 40-45% in 11/2013, improved to 55-60% in 05/2016.  Marland Kitchen Complete AV block ([place])   . COPD (chronic obstructive pulmonary disease) ([place])   . Family history of anesthesia complication    SISTER HAS DIFFICULTY WAKING  . GERD (gastroesophageal reflux disease)   . Hypertension   . Ischemic cardiomyopathy    a.  Echo (9/15):  EF 40-45%, ant and apical and inferoapical HK, Gr 1 DD, trivial MR, mild TR, PASP 23 mmHg;  b. Myoview (9/15) large scar in mid LAD territory, no peri-infarct ischemia, EF 60%, apical AK; intermediate risk >> patient opted for med Rx. b. EF 55-60% by echo 05/2016.  . Orthostasis   . Osteoarthritis   . Osteopenia   . Pacemaker   . Pneumonia   . Pre-syncope   . Presence of permanent cardiac pacemaker   . Shortness of breath   . Tachycardia   . TIA (transient ischemic attack)    a. possible TIA 11/2013.  Marland Kitchen UTI (urinary tract  infection)     Patient Active Problem List   Diagnosis Date Noted  . Nondisplaced fracture of greater trochanter of right femur, initial encounter for closed fracture ([place]) 02/08/2018  . Subdural hematoma ([place]) 01/16/2018  . GERD (gastroesophageal reflux disease) 01/09/2018  . CAD (coronary artery disease) 01/08/2018  . Chest pain 05/19/2016  . Chronic respiratory failure with hypoxia, on home O2 therapy ([place]) 05/19/2016  . History of LAD infarction 05/19/2016  . Fever 05/19/2016  . Chronic combined systolic and diastolic heart failure, NYHA class 2 ([place]) 05/19/2016  . Ischemic cardiomyopathy 05/19/2016  . Dyslipidemia   . Blurred vision 08/05/2014  . Anginal pain ([place]) 01/22/2014  . Elevated troponin 11/26/2013  . Acute diastolic congestive heart failure ([place]) 07/25/2012  . Elevated brain natriuretic peptide (BNP) level 07/21/2012  . Orthostatic hypotension 08/16/2011  . Non-STEMI  possibly type II 11/17/2010  . VENTRICULAR TACHYCARDIA 09/29/2009  . HTN (hypertension) 06/06/2008  . AV BLOCK, COMPLETE 06/06/2008  . COPD (chronic obstructive pulmonary disease) with emphysema ([place]) 06/06/2008  . OSTEOARTHRITIS 06/06/2008  . OSTEOPENIA 06/06/2008  . TACHYCARDIA 06/06/2008  . Pacemaker-Medtronic 06/06/2008    Past Surgical History:  Procedure Laterality Date  . CHOLECYSTECTOMY    . EYE SURGERY  10/2004  . HERNIA REPAIR  11/2002  . PACEMAKER PLACEMENT  Medtronic     OB History   No obstetric history on file.      Home Medications    Prior to Admission medications   Medication Sig Start Date End Date Taking? Authorizing Provider  albuterol (PROAIR HFA) 108 (90 BASE) MCG/ACT inhaler Inhale 2 puffs into the lungs every 6 (six) hours as needed for wheezing or shortness of breath.    Yes [provider]  ALPRAZolam Duanne Moron) 0.5 MG tablet Take 0.5 mg by mouth at bedtime as needed for sleep.    Yes [provider]  beta carotene w/minerals (OCUVITE) tablet  Take 1 tablet by mouth daily.   Yes [provider]  Calcium Carbonate (CALTRATE 600) 1500 MG TABS Take 600 mg by mouth daily.    Yes [provider]  cholecalciferol (VITAMIN D) 1000 UNITS tablet Take 1,000 Units by mouth daily.     Yes [provider]  Coenzyme Q10 (CO Q 10 PO) Take 1 capsule by mouth daily.   Yes [provider]  cyanocobalamin 1000 MCG tablet Take 1,000 mcg by mouth daily.    Yes [provider]  furosemide (LASIX) 20 MG tablet Take 0.5 tablets (10 mg total) by mouth daily. 01/18/18  Yes Rai, Ripudeep K, MD  levalbuterol (XOPENEX) 0.63 MG/3ML nebulizer solution Take 0.63 mg by nebulization every 6 (six) hours as needed for shortness of breath. 07/25/12  Yes Rai, Ripudeep K, MD  losartan (COZAAR) 25 MG tablet Take 1 tablet (25 mg total) by mouth daily. Please keep upcoming appt for future refills. Thank you 01/10/18  Yes Richardson Dopp T, PA-C  Magnesium 250 MG TABS Take 250 mg by mouth daily.    Yes [provider]  nitroGLYCERIN (NITROSTAT) 0.4 MG SL tablet Place 1 tablet (0.4 mg total) under the tongue every 5 (five) minutes as needed for chest pain. 11/28/13  Yes Charlynne Cousins, MD  pantoprazole (PROTONIX) 40 MG tablet Take 40 mg by mouth daily. 01/04/18  Yes [provider]  Polyethyl Glycol-Propyl Glycol (SYSTANE) 0.4-0.3 % SOLN Place 1 drop into both eyes 2 (two) times daily as needed (dry eyes).   Yes [provider]  potassium chloride (K-DUR) 10 MEQ tablet Take 10 mEq by mouth 2 (two) times a week. Tuesday and Friday   Yes [provider]    Family History Family History  Problem Relation Age of Onset  . Heart disease Sister   . Breast cancer Sister     Social History Social History   Tobacco Use  . Smoking status: Former Smoker    Packs/day: 1.00    Years: 35.00    Pack years: 35.00    Types: Cigarettes    Last attempt to quit: 03/16/1987    Years since quitting: 30.9  .  Smokeless tobacco: Never Used  Substance Use Topics  . Alcohol use: No  . Drug use: No     Allergies   Ace inhibitors and Hydrocodone-acetaminophen   Review of Systems Review of Systems  Constitutional: Negative for chills and fever.  HENT: Negative for sore throat and trouble swallowing.   Eyes: Negative for photophobia and visual disturbance.  Respiratory: Negative for cough and shortness of breath.   Cardiovascular: Negative for chest pain and leg swelling.  Gastrointestinal: Positive for diarrhea and nausea. Negative for abdominal pain, constipation and vomiting.  Genitourinary: Negative for dysuria, flank pain, frequency and hematuria.  Musculoskeletal: Negative for back pain, myalgias, neck pain and neck stiffness.  Skin: Negative  for rash and wound.  Neurological: Positive for syncope and light-headedness. Negative for weakness, numbness and headaches.     Physical Exam Updated Vital Signs BP (!) 148/58 (BP Location: Right Arm)   Pulse 73   Temp 98.3 F (36.8 C) (Oral)   Resp 19   Ht 5\' 3"  (1.6 m)   Wt 46.3 kg   SpO2 100%   BMI 18.07 kg/m   Physical Exam Vitals signs and nursing note reviewed.  Constitutional:      General: She is not in acute distress.    Appearance: Normal appearance. She is well-developed. She is not ill-appearing.  HENT:     Head: Normocephalic and atraumatic.     Comments: No evidence of head trauma.  No intraoral trauma.    Nose: Nose normal.     Mouth/Throat:     Mouth: Mucous membranes are moist.     Pharynx: No oropharyngeal exudate or posterior oropharyngeal erythema.  Eyes:     Extraocular Movements: Extraocular movements intact.     Conjunctiva/sclera: Conjunctivae normal.     Pupils: Pupils are equal, round, and reactive to light.  Neck:     Musculoskeletal: Normal range of motion and neck supple.     Comments: No posterior midline cervical tenderness to palpation. Cardiovascular:     Rate and Rhythm: Normal rate and  regular rhythm.     Heart sounds: No murmur. No friction rub. No gallop.   Pulmonary:     Effort: Pulmonary effort is normal.     Breath sounds: Normal breath sounds.  Abdominal:     General: Bowel sounds are normal.     Palpations: Abdomen is soft.     Tenderness: There is no abdominal tenderness. There is no guarding or rebound.  Musculoskeletal: Normal range of motion.        General: No swelling, tenderness, deformity or signs of injury.     Right lower leg: No edema.     Left lower leg: No edema.  Skin:    General: Skin is warm and dry.     Capillary Refill: Capillary refill takes less than 2 seconds.     Findings: No erythema or rash.  Neurological:     General: No focal deficit present.     Mental Status: She is alert and oriented to person, place, and time.     Comments: Patient is alert and oriented x3 with clear, goal oriented speech. Patient has 5/5 motor in all extremities. Sensation is intact to light touch. Bilateral finger-to-nose is normal with no signs of dysmetria. Patient has a normal gait and walks without assistance.  Psychiatric:        Mood and Affect: Mood normal.        Behavior: Behavior normal.      ED Treatments / Results  Labs (all labs ordered are listed, but only abnormal results are displayed) Labs Reviewed  CBC WITH DIFFERENTIAL/PLATELET - Abnormal; Notable for the following components:      Result Value   RBC 3.68 (*)    Hemoglobin 11.3 (*)    MCV 101.6 (*)    All other components within normal limits  COMPREHENSIVE METABOLIC PANEL - Abnormal; Notable for the following components:   Glucose, Bld 132 (*)    Calcium 8.0 (*)    Total Protein 5.6 (*)    Albumin 3.1 (*)    GFR calc non Af Amer 51 (*)    GFR calc Af Amer 59 (*)  All other components within normal limits  TROPONIN I    EKG EKG Interpretation  Date/Time:  Sunday February 26 2018 21:35:04 EST Ventricular Rate:  63 PR Interval:  130 QRS Duration: 138 QT  Interval:  440 QTC Calculation: 450 R Axis:   -90 Text Interpretation:  Electronic ventricular pacemaker Confirmed by Julianne Rice [phone]) on 02/26/2018 10:53:27 PM   Radiology Dg Chest 2 View  Result Date: 02/26/2018 CLINICAL DATA:  Syncope EXAM: CHEST - 2 VIEW COMPARISON:  01/16/2018 FINDINGS: There is hyperinflation of the lungs compatible with COPD. Left pacer in place with leads in the right atrium and right ventricle. Cardiomegaly. Increased interstitial markings within the lungs, likely chronic changes. No confluent airspace opacities or effusions. No acute bony abnormality. IMPRESSION: COPD.  Cardiomegaly.  No active disease. Electronically Signed   By: Rolm Baptise M.D.   On: 02/26/2018 23:12    Procedures Procedures (including critical care time)  Medications Ordered in ED Medications - No data to display   Initial Impression / Assessment and Plan / ED Course  I have reviewed the triage vital signs and the nursing notes.  Pertinent labs & imaging results that were available during my care of the patient were reviewed by me and considered in my medical decision making (see chart for details).     Daughter is at bedside.  States patient has had several days of loose stool and stopped taking her Protonix because she thought this was causing it.  States patient began complaining of lightheadedness while eating dinner.  She gave her nitroglycerin at which point she lost consciousness.  She then gave her 3 more nitroglycerin.  No trauma.  Laboratory work-up is normal.  Pacemaker working properly.  Patient maintaining adequate blood pressure.  Symptoms likely exacerbated by the nitroglycerin.  Patient family did not want to wait on her UA.  State they will follow-up with her primary physician.  Strict return precautions given. Final Clinical Impressions(s) / ED Diagnoses   Final diagnoses:  Syncope, unspecified syncope type  Diarrhea, unspecified type    ED Discharge  Orders    None       Julianne Rice, MD 02/26/18 Arnoldo Lenis    Julianne Rice, MD 04/09/18 [phone]

## 2018-03-01 ENCOUNTER — Ambulatory Visit (INDEPENDENT_AMBULATORY_CARE_PROVIDER_SITE_OTHER): Payer: Medicare Other

## 2018-03-01 ENCOUNTER — Ambulatory Visit (INDEPENDENT_AMBULATORY_CARE_PROVIDER_SITE_OTHER): Payer: Medicare Other | Admitting: Orthopaedic Surgery

## 2018-03-01 ENCOUNTER — Encounter (INDEPENDENT_AMBULATORY_CARE_PROVIDER_SITE_OTHER): Payer: Self-pay | Admitting: Orthopaedic Surgery

## 2018-03-01 DIAGNOSIS — S72114A Nondisplaced fracture of greater trochanter of right femur, initial encounter for closed fracture: Secondary | ICD-10-CM | POA: Diagnosis not present

## 2018-03-01 DIAGNOSIS — I442 Atrioventricular block, complete: Secondary | ICD-10-CM | POA: Diagnosis not present

## 2018-03-01 NOTE — Progress Notes (Signed)
Remote pacemaker transmission.   

## 2018-03-01 NOTE — Progress Notes (Signed)
Office Visit Note   Patient: Jamie [name]           Date of Birth: Nov 24, 1922           MRN: [id number] Visit Date: 03/01/2018              Requested by: Crist Infante, MD [address] [place], [place] [address] PCP: Crist Infante, MD   Assessment & Plan: Visit Diagnoses:  1. Nondisplaced fracture of greater trochanter of right femur, initial encounter for closed fracture [facility])     Plan: At this point her fracture is doing well and she may increase activity as tolerated.  She just needs to work on physical therapy for strengthening at this point.  She may follow-up with me as needed.  Follow-Up Instructions: Return if symptoms worsen or fail to improve.   Orders:  Orders Placed This Encounter  Procedures  . XR HIP UNILAT W OR W/O PELVIS 2-3 VIEWS RIGHT   No orders of the defined types were placed in this encounter.     Procedures: No procedures performed   Clinical Data: No additional findings.   Subjective: Chief Complaint  Patient presents with  . Right Hip - Follow-up    Jamie [name] is following up for her right greater trochanter fracture.  She is 3 weeks from the original injury.  She states that she is doing well has no pain.  She is getting home health physical therapy.  She has been able to sleep on the right side without any problems.   Review of Systems   Objective: Vital Signs: There were no vitals taken for this visit.  Physical Exam  Ortho Exam Right hip exam is unremarkable.  There is no tenderness palpation there is no swelling there is no bruising.  She has full hip range of motion without pain. Specialty Comments:  No specialty comments available.  Imaging: Xr Hip Unilat W Or W/o Pelvis 2-3 Views Right  Result Date: 03/01/2018 No interval displacement of greater trochanter avulsion fracture.    PMFS History: Patient Active Problem List   Diagnosis Date Noted  . Nondisplaced fracture of greater trochanter of right femur, initial  encounter for closed fracture ([place]) 02/08/2018  . Subdural hematoma ([place]) 01/16/2018  . GERD (gastroesophageal reflux disease) 01/09/2018  . CAD (coronary artery disease) 01/08/2018  . Chest pain 05/19/2016  . Chronic respiratory failure with hypoxia, on home O2 therapy ([place]) 05/19/2016  . History of LAD infarction 05/19/2016  . Fever 05/19/2016  . Chronic combined systolic and diastolic heart failure, NYHA class 2 ([place]) 05/19/2016  . Ischemic cardiomyopathy 05/19/2016  . Dyslipidemia   . Blurred vision 08/05/2014  . Anginal pain ([place]) 01/22/2014  . Elevated troponin 11/26/2013  . Acute diastolic congestive heart failure ([place]) 07/25/2012  . Elevated brain natriuretic peptide (BNP) level 07/21/2012  . Orthostatic hypotension 08/16/2011  . Non-STEMI  possibly type II 11/17/2010  . VENTRICULAR TACHYCARDIA 09/29/2009  . HTN (hypertension) 06/06/2008  . AV BLOCK, COMPLETE 06/06/2008  . COPD (chronic obstructive pulmonary disease) with emphysema ([place]) 06/06/2008  . OSTEOARTHRITIS 06/06/2008  . OSTEOPENIA 06/06/2008  . TACHYCARDIA 06/06/2008  . Pacemaker-Medtronic 06/06/2008   Past Medical History:  Diagnosis Date  . Asthma   . Carotid stenosis    a. Carotid US (9/15):  Bilateral ICA 1-39%  . Chronic combined systolic and diastolic CHF (congestive heart failure) ([place])    a. EF 40-45% in 11/2013, improved to 55-60% in 05/2016.  Marland Kitchen Complete AV block ([place])   .  COPD (chronic obstructive pulmonary disease) ([place])   . Family history of anesthesia complication    SISTER HAS DIFFICULTY WAKING  . GERD (gastroesophageal reflux disease)   . Hypertension   . Ischemic cardiomyopathy    a.  Echo (9/15):  EF 40-45%, ant and apical and inferoapical HK, Gr 1 DD, trivial MR, mild TR, PASP 23 mmHg;  b. Myoview (9/15) large scar in mid LAD territory, no peri-infarct ischemia, EF 60%, apical AK; intermediate risk >> patient opted for med Rx. b. EF 55-60% by echo 05/2016.  . Orthostasis   . Osteoarthritis     . Osteopenia   . Pacemaker   . Pneumonia   . Pre-syncope   . Presence of permanent cardiac pacemaker   . Shortness of breath   . Tachycardia   . TIA (transient ischemic attack)    a. possible TIA 11/2013.  Marland Kitchen UTI (urinary tract infection)     Family History  Problem Relation Age of Onset  . Heart disease Sister   . Breast cancer Sister     Past Surgical History:  Procedure Laterality Date  . CHOLECYSTECTOMY    . EYE SURGERY  10/2004  . HERNIA REPAIR  11/2002  . PACEMAKER PLACEMENT     Medtronic   Social History   Occupational History  . Occupation: retired    Fish farm manager: RETIRED  Tobacco Use  . Smoking status: Former Smoker    Packs/day: 1.00    Years: 35.00    Pack years: 35.00    Types: Cigarettes    Last attempt to quit: 03/16/1987    Years since quitting: 30.9  . Smokeless tobacco: Never Used  Substance and Sexual Activity  . Alcohol use: No  . Drug use: No  . Sexual activity: Never    Birth control/protection: Post-menopausal

## 2018-03-02 ENCOUNTER — Encounter: Payer: Self-pay | Admitting: Cardiology

## 2018-03-03 DIAGNOSIS — S72111D Displaced fracture of greater trochanter of right femur, subsequent encounter for closed fracture with routine healing: Secondary | ICD-10-CM | POA: Diagnosis not present

## 2018-03-03 DIAGNOSIS — I5042 Chronic combined systolic (congestive) and diastolic (congestive) heart failure: Secondary | ICD-10-CM | POA: Diagnosis not present

## 2018-03-03 DIAGNOSIS — I255 Ischemic cardiomyopathy: Secondary | ICD-10-CM | POA: Diagnosis not present

## 2018-03-03 DIAGNOSIS — J432 Centrilobular emphysema: Secondary | ICD-10-CM | POA: Diagnosis not present

## 2018-03-03 DIAGNOSIS — S065X9D Traumatic subdural hemorrhage with loss of consciousness of unspecified duration, subsequent encounter: Secondary | ICD-10-CM | POA: Diagnosis not present

## 2018-03-03 DIAGNOSIS — I110 Hypertensive heart disease with heart failure: Secondary | ICD-10-CM | POA: Diagnosis not present

## 2018-03-09 ENCOUNTER — Ambulatory Visit (INDEPENDENT_AMBULATORY_CARE_PROVIDER_SITE_OTHER): Payer: Medicare Other | Admitting: Internal Medicine

## 2018-03-09 ENCOUNTER — Encounter: Payer: Self-pay | Admitting: Internal Medicine

## 2018-03-09 VITALS — BP 164/80 | HR 82 | Ht 63.0 in | Wt 101.0 lb

## 2018-03-09 DIAGNOSIS — Z950 Presence of cardiac pacemaker: Secondary | ICD-10-CM | POA: Diagnosis not present

## 2018-03-09 DIAGNOSIS — I10 Essential (primary) hypertension: Secondary | ICD-10-CM

## 2018-03-09 DIAGNOSIS — I2510 Atherosclerotic heart disease of native coronary artery without angina pectoris: Secondary | ICD-10-CM | POA: Diagnosis not present

## 2018-03-09 DIAGNOSIS — I951 Orthostatic hypotension: Secondary | ICD-10-CM

## 2018-03-09 DIAGNOSIS — I442 Atrioventricular block, complete: Secondary | ICD-10-CM

## 2018-03-09 LAB — CUP PACEART INCLINIC DEVICE CHECK
Battery Remaining Longevity: 19 mo
Brady Statistic AP VP Percent: 0 %
Brady Statistic AP VS Percent: 0 %
Brady Statistic AS VP Percent: 100 %
Date Time Interrogation Session: 20191226165355
Implantable Lead Implant Date: 20020116
Implantable Lead Location: 753859
Lead Channel Impedance Value: 441 Ohm
Lead Channel Pacing Threshold Amplitude: 0.75 V
Lead Channel Pacing Threshold Amplitude: 0.75 V
Lead Channel Sensing Intrinsic Amplitude: 0.7 mV
Lead Channel Setting Pacing Amplitude: 2.5 V
Lead Channel Setting Pacing Pulse Width: 0.4 ms
MDC IDC LEAD IMPLANT DT: 20020116
MDC IDC LEAD LOCATION: 753860
MDC IDC MSMT BATTERY IMPEDANCE: 3038 Ohm
MDC IDC MSMT BATTERY VOLTAGE: 2.72 V
MDC IDC MSMT LEADCHNL RA IMPEDANCE VALUE: 67 Ohm
MDC IDC MSMT LEADCHNL RV PACING THRESHOLD PULSEWIDTH: 0.4 ms
MDC IDC MSMT LEADCHNL RV PACING THRESHOLD PULSEWIDTH: 0.4 ms
MDC IDC PG IMPLANT DT: 20101108
MDC IDC SET LEADCHNL RV SENSING SENSITIVITY: 11.2 mV
MDC IDC STAT BRADY AS VS PERCENT: 0 %

## 2018-03-09 NOTE — Progress Notes (Signed)
[facility]      Patient Care Team: Crist Infante, MD as PCP - General (Internal Medicine) Deboraha Sprang, MD as PCP - Cardiology (Cardiology)   HPI  Jamie [name] is a 84 y.o. female  seen in followup for complete heart block for which she is status post pacemaker implantation which is programmed in the VDD mode. She underwent device generator replacement fall 2010  She also has CAD with complete LAD infarct  and now has oxygen dependent COP    Catheterization>>> no obstructive coronary disease. (August 2012)    DATE TEST EF   2012 [facility]  No obstructive CAD  9/15 Echo   40% %   3/15 Myoview 60% AnteriorApical  Scar  3/18 Echo   55-65 %           2 intercurrent hospitalizations for syncope and falls; 11/19 associated with subdural hematoma that was followed conservatively.  Aspirin was discontinued.  She was seen a couple of weeks later (02/26/2018 with recurrent syncope.  This occurred in the context of several days of loose stools became lightheaded and nauseated.  Family gave nitroglycerin.  On arrival by EMS blood pressure was 70.  IV fluids given.  Date Cr K Hgb  12/19 0.96 4.0 11.3<<13.4           Past Medical History:  Diagnosis Date  . Asthma   . Carotid stenosis    a. Carotid US (9/15):  Bilateral ICA 1-39%  . Chronic combined systolic and diastolic CHF (congestive heart failure) ([place])    a. EF 40-45% in 11/2013, improved to 55-60% in 05/2016.  Marland Kitchen Complete AV block ([place])   . COPD (chronic obstructive pulmonary disease) ([place])   . Family history of anesthesia complication    SISTER HAS DIFFICULTY WAKING  . GERD (gastroesophageal reflux disease)   . Hypertension   . Ischemic cardiomyopathy    a.  Echo (9/15):  EF 40-45%, ant and apical and inferoapical HK, Gr 1 DD, trivial MR, mild TR, PASP 23 mmHg;  b. Myoview (9/15) large scar in mid LAD territory, no peri-infarct ischemia, EF 60%, apical AK; intermediate risk >> patient opted for med Rx. b. EF 55-60% by echo 05/2016.   . Orthostasis   . Osteoarthritis   . Osteopenia   . Pacemaker   . Pneumonia   . Pre-syncope   . Presence of permanent cardiac pacemaker   . Shortness of breath   . Tachycardia   . TIA (transient ischemic attack)    a. possible TIA 11/2013.  Marland Kitchen UTI (urinary tract infection)     Past Surgical History:  Procedure Laterality Date  . CHOLECYSTECTOMY    . EYE SURGERY  10/2004  . HERNIA REPAIR  11/2002  . PACEMAKER PLACEMENT     Medtronic    Current Outpatient Medications  Medication Sig Dispense Refill  . albuterol (PROAIR HFA) 108 (90 BASE) MCG/ACT inhaler Inhale 2 puffs into the lungs every 6 (six) hours as needed for wheezing or shortness of breath.     . ALPRAZolam (XANAX) 0.5 MG tablet Take 0.5 mg by mouth at bedtime as needed for sleep.     . beta carotene w/minerals (OCUVITE) tablet Take 1 tablet by mouth daily.    . Calcium Carbonate (CALTRATE 600) 1500 MG TABS Take 600 mg by mouth daily.     . cholecalciferol (VITAMIN D) 1000 UNITS tablet Take 1,000 Units by mouth daily.      . Coenzyme Q10 (CO Q 10  PO) Take 1 capsule by mouth daily.    . cyanocobalamin 1000 MCG tablet Take 1,000 mcg by mouth daily.     . furosemide (LASIX) 20 MG tablet Take 0.5 tablets (10 mg total) by mouth daily. 30 tablet 1  . levalbuterol (XOPENEX) 0.63 MG/3ML nebulizer solution Take 0.63 mg by nebulization every 6 (six) hours as needed for shortness of breath.    . losartan (COZAAR) 25 MG tablet Take 1 tablet (25 mg total) by mouth daily. Please keep upcoming appt for future refills. Thank you 30 tablet 2  . Magnesium 250 MG TABS Take 250 mg by mouth daily.     . nitroGLYCERIN (NITROSTAT) 0.4 MG SL tablet Place 1 tablet (0.4 mg total) under the tongue every 5 (five) minutes as needed for chest pain. 15 tablet 12  . pantoprazole (PROTONIX) 40 MG tablet Take 40 mg by mouth daily.  3  . Polyethyl Glycol-Propyl Glycol (SYSTANE) 0.4-0.3 % SOLN Place 1 drop into both eyes 2 (two) times daily as needed (dry  eyes).    . potassium chloride (K-DUR) 10 MEQ tablet Take 10 mEq by mouth 2 (two) times a week. Tuesday and Friday     No current facility-administered medications for this visit.     Allergies  Allergen Reactions  . Ace Inhibitors Cough  . Hydrocodone-Acetaminophen Other (See Comments)     GI distress    Review of Systems negative except from HPI and PMH  Physical Exam BP (!) 164/80   Pulse 82   Ht 5\' 3"  (1.6 m)   Wt 101 lb (45.8 kg)   SpO2 98%   BMI 17.89 kg/m  Well developed and nourished in no acute distress wearing O2 HENT normal Neck supple with JVP-flat Clear Regular rate and rhythm, no murmurs or gallops Abd-soft with active BS No Clubbing cyanosis edema Skin-warm and dry A & Oriented  Grossly normal sensory and motor function y  ECG demonstrates sinus rhythm with P   synchronous pacing at rate  64 Assessment and  Plan  Coronary artery disease with prior MI  Complete heart block  Pacemaker-Medtronic  The patient's device was interrogated.  The information was reviewed. No changes were made in the programming.     Statin intolerance  Syncope -orthostatic  Hypertension   Anemia  Interval atrial fibrillation of greater than 2 days duration.  Have discussed the issue of thromboembolism.  For right now we will follow.  The interval subdural hematoma related to a fall would not dissuade  Blood pressure remains elevated; however, with orthostatic hypotension would not try and decrease it further  Hemoglobin is decreased over the last month.  We will recheck it.  We spent more than 50% of our >25 min visit in face to face counseling regarding the above Seen today at today  Without symptoms of ischemia

## 2018-03-09 NOTE — Patient Instructions (Addendum)
Medication Instructions:  Your physician recommends that you continue on your current medications as directed. Please refer to the Current Medication list given to you today.  Labwork: You will get lab work today:  CBC  Testing/Procedures: None ordered.  Follow-Up: Your physician wants you to follow-up in: 6 months with Dr. Caryl Comes EP APP. You will receive a reminder letter in the mail two months in advance. If you don't receive a letter, please call our office to schedule the follow-up appointment.  Remote monitoring is used to monitor your Pacemaker from home. This monitoring reduces the number of office visits required to check your device to one time per year. It allows [place] to keep an eye on the functioning of your device to ensure it is working properly. You are scheduled for a device check from home on 05/31/2018. You may send your transmission at any time that day. If you have a wireless device, the transmission will be sent automatically. After your physician reviews your transmission, you will receive a postcard with your next transmission date.  Any Other Special Instructions Will Be Listed Below (If Applicable).  If you need a refill on your cardiac medications before your next appointment, please call your pharmacy.

## 2018-03-10 DIAGNOSIS — I255 Ischemic cardiomyopathy: Secondary | ICD-10-CM | POA: Diagnosis not present

## 2018-03-10 DIAGNOSIS — S065X9D Traumatic subdural hemorrhage with loss of consciousness of unspecified duration, subsequent encounter: Secondary | ICD-10-CM | POA: Diagnosis not present

## 2018-03-10 DIAGNOSIS — I110 Hypertensive heart disease with heart failure: Secondary | ICD-10-CM | POA: Diagnosis not present

## 2018-03-10 DIAGNOSIS — J432 Centrilobular emphysema: Secondary | ICD-10-CM | POA: Diagnosis not present

## 2018-03-10 DIAGNOSIS — I5042 Chronic combined systolic (congestive) and diastolic (congestive) heart failure: Secondary | ICD-10-CM | POA: Diagnosis not present

## 2018-03-10 DIAGNOSIS — S72111D Displaced fracture of greater trochanter of right femur, subsequent encounter for closed fracture with routine healing: Secondary | ICD-10-CM | POA: Diagnosis not present

## 2018-03-10 LAB — CBC WITH DIFFERENTIAL/PLATELET
BASOS: 1 %
Basophils Absolute: 0.1 10*3/uL (ref 0.0–0.2)
EOS (ABSOLUTE): 0.2 10*3/uL (ref 0.0–0.4)
EOS: 2 %
HEMOGLOBIN: 13.6 g/dL (ref 11.1–15.9)
Hematocrit: 40.1 % (ref 34.0–46.6)
IMMATURE GRANULOCYTES: 0 %
Immature Grans (Abs): 0 10*3/uL (ref 0.0–0.1)
LYMPHS: 16 %
Lymphocytes Absolute: 1.5 10*3/uL (ref 0.7–3.1)
MCH: 32 pg (ref 26.6–33.0)
MCHC: 33.9 g/dL (ref 31.5–35.7)
MCV: 94 fL (ref 79–97)
MONOCYTES: 10 %
Monocytes Absolute: 0.9 10*3/uL (ref 0.1–0.9)
NEUTROS ABS: 6.7 10*3/uL (ref 1.4–7.0)
NEUTROS PCT: 71 %
Platelets: 297 10*3/uL (ref 150–450)
RBC: 4.25 x10E6/uL (ref 3.77–5.28)
RDW: 11.9 % — ABNORMAL LOW (ref 12.3–15.4)
WBC: 9.3 10*3/uL (ref 3.4–10.8)

## 2018-03-13 ENCOUNTER — Encounter

## 2018-03-14 ENCOUNTER — Encounter: Payer: Self-pay | Admitting: *Deleted

## 2018-03-20 DIAGNOSIS — I10 Essential (primary) hypertension: Secondary | ICD-10-CM | POA: Diagnosis not present

## 2018-03-20 DIAGNOSIS — M810 Age-related osteoporosis without current pathological fracture: Secondary | ICD-10-CM | POA: Diagnosis not present

## 2018-03-20 DIAGNOSIS — E7849 Other hyperlipidemia: Secondary | ICD-10-CM | POA: Diagnosis not present

## 2018-03-20 DIAGNOSIS — R7301 Impaired fasting glucose: Secondary | ICD-10-CM | POA: Diagnosis not present

## 2018-03-20 DIAGNOSIS — E785 Hyperlipidemia, unspecified: Secondary | ICD-10-CM | POA: Diagnosis not present

## 2018-03-20 DIAGNOSIS — R82998 Other abnormal findings in urine: Secondary | ICD-10-CM | POA: Diagnosis not present

## 2018-04-01 ENCOUNTER — Other Ambulatory Visit: Payer: Self-pay | Admitting: Internal Medicine

## 2018-04-02 LAB — CUP PACEART REMOTE DEVICE CHECK
Brady Statistic AP VP Percent: 0 %
Brady Statistic AP VS Percent: 0 %
Brady Statistic AS VS Percent: 0 %
Date Time Interrogation Session: 20191216023125
Implantable Lead Implant Date: 20020116
Implantable Lead Implant Date: 20020116
Implantable Lead Location: 753859
Implantable Pulse Generator Implant Date: 20101108
Lead Channel Impedance Value: 402 Ohm
Lead Channel Impedance Value: 67 Ohm
Lead Channel Pacing Threshold Amplitude: 0.875 V
Lead Channel Pacing Threshold Pulse Width: 0.4 ms
Lead Channel Setting Pacing Pulse Width: 0.4 ms
MDC IDC LEAD LOCATION: 753860
MDC IDC MSMT BATTERY IMPEDANCE: 2913 Ohm
MDC IDC MSMT BATTERY REMAINING LONGEVITY: 20 mo
MDC IDC MSMT BATTERY VOLTAGE: 2.73 V
MDC IDC SET LEADCHNL RV PACING AMPLITUDE: 2.5 V
MDC IDC SET LEADCHNL RV SENSING SENSITIVITY: 11.2 mV
MDC IDC STAT BRADY AS VP PERCENT: 100 %

## 2018-04-11 DIAGNOSIS — I2510 Atherosclerotic heart disease of native coronary artery without angina pectoris: Secondary | ICD-10-CM | POA: Diagnosis not present

## 2018-04-11 DIAGNOSIS — Z1331 Encounter for screening for depression: Secondary | ICD-10-CM | POA: Diagnosis not present

## 2018-04-11 DIAGNOSIS — M810 Age-related osteoporosis without current pathological fracture: Secondary | ICD-10-CM | POA: Diagnosis not present

## 2018-04-11 DIAGNOSIS — K219 Gastro-esophageal reflux disease without esophagitis: Secondary | ICD-10-CM | POA: Diagnosis not present

## 2018-04-11 DIAGNOSIS — Z0000 Encounter for general adult medical examination without abnormal findings: Secondary | ICD-10-CM | POA: Diagnosis not present

## 2018-04-11 DIAGNOSIS — I5189 Other ill-defined heart diseases: Secondary | ICD-10-CM | POA: Diagnosis not present

## 2018-04-11 DIAGNOSIS — J449 Chronic obstructive pulmonary disease, unspecified: Secondary | ICD-10-CM | POA: Diagnosis not present

## 2018-04-11 DIAGNOSIS — R7301 Impaired fasting glucose: Secondary | ICD-10-CM | POA: Diagnosis not present

## 2018-04-11 DIAGNOSIS — J45998 Other asthma: Secondary | ICD-10-CM | POA: Diagnosis not present

## 2018-04-11 DIAGNOSIS — E7849 Other hyperlipidemia: Secondary | ICD-10-CM | POA: Diagnosis not present

## 2018-04-11 DIAGNOSIS — Z950 Presence of cardiac pacemaker: Secondary | ICD-10-CM | POA: Diagnosis not present

## 2018-04-11 DIAGNOSIS — Z681 Body mass index (BMI) 19 or less, adult: Secondary | ICD-10-CM | POA: Diagnosis not present

## 2018-04-15 ENCOUNTER — Other Ambulatory Visit: Payer: Self-pay | Admitting: Physician Assistant

## 2018-05-08 MED ORDER — LOSARTAN POTASSIUM 25 MG PO TABS: 25.0000 mg | ORAL_TABLET | Freq: Every day | ORAL | 2 refills | Status: DC

## 2018-05-08 NOTE — Telephone Encounter (Signed)
Pt's medication was sent to pt's pharmacy as requested. Confirmation received.  °

## 2018-05-08 NOTE — Addendum Note (Signed)
Addended by: Derl Barrow on: 05/08/2018 10:25 AM   Modules accepted: Orders

## 2018-05-31 ENCOUNTER — Other Ambulatory Visit: Payer: Self-pay

## 2018-05-31 ENCOUNTER — Encounter: Payer: Medicare Other | Admitting: *Deleted

## 2018-06-01 ENCOUNTER — Telehealth: Payer: Self-pay

## 2018-06-01 NOTE — Telephone Encounter (Signed)
Unable to leave a message for patient to remind of missed remote transmission.  

## 2018-06-08 ENCOUNTER — Encounter: Payer: Self-pay | Admitting: Cardiology

## 2018-07-12 DIAGNOSIS — M810 Age-related osteoporosis without current pathological fracture: Secondary | ICD-10-CM | POA: Diagnosis not present

## 2018-07-12 DIAGNOSIS — K219 Gastro-esophageal reflux disease without esophagitis: Secondary | ICD-10-CM | POA: Diagnosis not present

## 2018-07-12 DIAGNOSIS — J449 Chronic obstructive pulmonary disease, unspecified: Secondary | ICD-10-CM | POA: Diagnosis not present

## 2018-07-12 DIAGNOSIS — I2510 Atherosclerotic heart disease of native coronary artery without angina pectoris: Secondary | ICD-10-CM | POA: Diagnosis not present

## 2018-07-12 DIAGNOSIS — Z950 Presence of cardiac pacemaker: Secondary | ICD-10-CM | POA: Diagnosis not present

## 2018-07-12 DIAGNOSIS — I6200 Nontraumatic subdural hemorrhage, unspecified: Secondary | ICD-10-CM | POA: Diagnosis not present

## 2018-07-12 DIAGNOSIS — I5189 Other ill-defined heart diseases: Secondary | ICD-10-CM | POA: Diagnosis not present

## 2018-07-12 DIAGNOSIS — I10 Essential (primary) hypertension: Secondary | ICD-10-CM | POA: Diagnosis not present

## 2018-08-15 DIAGNOSIS — I10 Essential (primary) hypertension: Secondary | ICD-10-CM | POA: Diagnosis not present

## 2018-08-15 DIAGNOSIS — R197 Diarrhea, unspecified: Secondary | ICD-10-CM | POA: Diagnosis not present

## 2018-09-06 ENCOUNTER — Other Ambulatory Visit: Payer: Self-pay | Admitting: Family Medicine

## 2018-09-06 ENCOUNTER — Ambulatory Visit
Admission: RE | Admit: 2018-09-06 | Discharge: 2018-09-06 | Disposition: A | Discharge status: Home / Self Care | Payer: Medicare Other | Source: Ambulatory Visit | Attending: Family Medicine | Admitting: Family Medicine

## 2018-09-06 DIAGNOSIS — R197 Diarrhea, unspecified: Secondary | ICD-10-CM

## 2018-09-06 DIAGNOSIS — R1030 Lower abdominal pain, unspecified: Secondary | ICD-10-CM | POA: Diagnosis not present

## 2018-09-06 DIAGNOSIS — R109 Unspecified abdominal pain: Secondary | ICD-10-CM

## 2018-09-07 ENCOUNTER — Other Ambulatory Visit: Payer: Self-pay | Admitting: Family Medicine

## 2018-09-07 DIAGNOSIS — R109 Unspecified abdominal pain: Secondary | ICD-10-CM

## 2018-09-11 DIAGNOSIS — R197 Diarrhea, unspecified: Secondary | ICD-10-CM | POA: Diagnosis not present

## 2018-09-25 ENCOUNTER — Ambulatory Visit
Admission: RE | Admit: 2018-09-25 | Discharge: 2018-09-25 | Disposition: A | Discharge status: Home / Self Care | Payer: Medicare Other | Source: Ambulatory Visit | Attending: Family Medicine | Admitting: Family Medicine

## 2018-09-25 DIAGNOSIS — R109 Unspecified abdominal pain: Secondary | ICD-10-CM | POA: Diagnosis not present

## 2018-09-25 DIAGNOSIS — R197 Diarrhea, unspecified: Secondary | ICD-10-CM | POA: Diagnosis not present

## 2018-09-25 DIAGNOSIS — R110 Nausea: Secondary | ICD-10-CM | POA: Diagnosis not present

## 2018-11-13 DIAGNOSIS — I10 Essential (primary) hypertension: Secondary | ICD-10-CM | POA: Diagnosis not present

## 2018-11-13 DIAGNOSIS — M810 Age-related osteoporosis without current pathological fracture: Secondary | ICD-10-CM | POA: Diagnosis not present

## 2018-11-13 DIAGNOSIS — Z950 Presence of cardiac pacemaker: Secondary | ICD-10-CM | POA: Diagnosis not present

## 2018-11-13 DIAGNOSIS — I5189 Other ill-defined heart diseases: Secondary | ICD-10-CM | POA: Diagnosis not present

## 2018-11-13 DIAGNOSIS — J449 Chronic obstructive pulmonary disease, unspecified: Secondary | ICD-10-CM | POA: Diagnosis not present

## 2018-11-13 DIAGNOSIS — J45909 Unspecified asthma, uncomplicated: Secondary | ICD-10-CM | POA: Diagnosis not present

## 2018-11-13 DIAGNOSIS — I2510 Atherosclerotic heart disease of native coronary artery without angina pectoris: Secondary | ICD-10-CM | POA: Diagnosis not present

## 2018-11-13 DIAGNOSIS — E7849 Other hyperlipidemia: Secondary | ICD-10-CM | POA: Diagnosis not present

## 2019-01-09 DIAGNOSIS — H353212 Exudative age-related macular degeneration, right eye, with inactive choroidal neovascularization: Secondary | ICD-10-CM | POA: Diagnosis not present

## 2019-01-09 DIAGNOSIS — H353112 Nonexudative age-related macular degeneration, right eye, intermediate dry stage: Secondary | ICD-10-CM | POA: Diagnosis not present

## 2019-01-09 DIAGNOSIS — H353222 Exudative age-related macular degeneration, left eye, with inactive choroidal neovascularization: Secondary | ICD-10-CM | POA: Diagnosis not present

## 2019-01-09 DIAGNOSIS — H353122 Nonexudative age-related macular degeneration, left eye, intermediate dry stage: Secondary | ICD-10-CM | POA: Diagnosis not present

## 2019-01-23 DIAGNOSIS — Z23 Encounter for immunization: Secondary | ICD-10-CM | POA: Diagnosis not present

## 2019-01-24 ENCOUNTER — Other Ambulatory Visit: Payer: Self-pay | Admitting: Physician Assistant

## 2019-02-12 DIAGNOSIS — H4301 Vitreous prolapse, right eye: Secondary | ICD-10-CM | POA: Diagnosis not present

## 2019-02-12 DIAGNOSIS — H353222 Exudative age-related macular degeneration, left eye, with inactive choroidal neovascularization: Secondary | ICD-10-CM | POA: Diagnosis not present

## 2019-02-12 DIAGNOSIS — H353122 Nonexudative age-related macular degeneration, left eye, intermediate dry stage: Secondary | ICD-10-CM | POA: Diagnosis not present

## 2019-02-12 DIAGNOSIS — H353212 Exudative age-related macular degeneration, right eye, with inactive choroidal neovascularization: Secondary | ICD-10-CM | POA: Diagnosis not present

## 2019-02-19 DIAGNOSIS — J449 Chronic obstructive pulmonary disease, unspecified: Secondary | ICD-10-CM | POA: Diagnosis not present

## 2019-02-19 DIAGNOSIS — J45909 Unspecified asthma, uncomplicated: Secondary | ICD-10-CM | POA: Diagnosis not present

## 2019-02-19 DIAGNOSIS — R7301 Impaired fasting glucose: Secondary | ICD-10-CM | POA: Diagnosis not present

## 2019-02-19 DIAGNOSIS — M810 Age-related osteoporosis without current pathological fracture: Secondary | ICD-10-CM | POA: Diagnosis not present

## 2019-02-19 DIAGNOSIS — I2510 Atherosclerotic heart disease of native coronary artery without angina pectoris: Secondary | ICD-10-CM | POA: Diagnosis not present

## 2019-02-19 DIAGNOSIS — I10 Essential (primary) hypertension: Secondary | ICD-10-CM | POA: Diagnosis not present

## 2019-02-19 DIAGNOSIS — I5189 Other ill-defined heart diseases: Secondary | ICD-10-CM | POA: Diagnosis not present

## 2019-02-19 DIAGNOSIS — K219 Gastro-esophageal reflux disease without esophagitis: Secondary | ICD-10-CM | POA: Diagnosis not present

## 2019-02-19 DIAGNOSIS — Z950 Presence of cardiac pacemaker: Secondary | ICD-10-CM | POA: Diagnosis not present

## 2019-05-09 DIAGNOSIS — Z23 Encounter for immunization: Secondary | ICD-10-CM | POA: Diagnosis not present

## 2019-05-18 DIAGNOSIS — E7849 Other hyperlipidemia: Secondary | ICD-10-CM | POA: Diagnosis not present

## 2019-05-18 DIAGNOSIS — M810 Age-related osteoporosis without current pathological fracture: Secondary | ICD-10-CM | POA: Diagnosis not present

## 2019-05-18 DIAGNOSIS — R7301 Impaired fasting glucose: Secondary | ICD-10-CM | POA: Diagnosis not present

## 2019-05-21 DIAGNOSIS — D539 Nutritional anemia, unspecified: Secondary | ICD-10-CM | POA: Diagnosis not present

## 2019-05-22 DIAGNOSIS — I10 Essential (primary) hypertension: Secondary | ICD-10-CM | POA: Diagnosis not present

## 2019-05-22 DIAGNOSIS — R82998 Other abnormal findings in urine: Secondary | ICD-10-CM | POA: Diagnosis not present

## 2019-05-25 DIAGNOSIS — E785 Hyperlipidemia, unspecified: Secondary | ICD-10-CM | POA: Diagnosis not present

## 2019-05-25 DIAGNOSIS — Z0000 Encounter for general adult medical examination without abnormal findings: Secondary | ICD-10-CM | POA: Diagnosis not present

## 2019-05-25 DIAGNOSIS — Z950 Presence of cardiac pacemaker: Secondary | ICD-10-CM | POA: Diagnosis not present

## 2019-05-25 DIAGNOSIS — M179 Osteoarthritis of knee, unspecified: Secondary | ICD-10-CM | POA: Diagnosis not present

## 2019-05-25 DIAGNOSIS — M810 Age-related osteoporosis without current pathological fracture: Secondary | ICD-10-CM | POA: Diagnosis not present

## 2019-05-25 DIAGNOSIS — I2510 Atherosclerotic heart disease of native coronary artery without angina pectoris: Secondary | ICD-10-CM | POA: Diagnosis not present

## 2019-05-25 DIAGNOSIS — R3121 Asymptomatic microscopic hematuria: Secondary | ICD-10-CM | POA: Diagnosis not present

## 2019-05-25 DIAGNOSIS — I10 Essential (primary) hypertension: Secondary | ICD-10-CM | POA: Diagnosis not present

## 2019-05-25 DIAGNOSIS — Z1331 Encounter for screening for depression: Secondary | ICD-10-CM | POA: Diagnosis not present

## 2019-05-25 DIAGNOSIS — I5189 Other ill-defined heart diseases: Secondary | ICD-10-CM | POA: Diagnosis not present

## 2019-05-25 DIAGNOSIS — K219 Gastro-esophageal reflux disease without esophagitis: Secondary | ICD-10-CM | POA: Diagnosis not present

## 2019-05-25 DIAGNOSIS — M160 Bilateral primary osteoarthritis of hip: Secondary | ICD-10-CM | POA: Diagnosis not present

## 2019-05-25 DIAGNOSIS — J449 Chronic obstructive pulmonary disease, unspecified: Secondary | ICD-10-CM | POA: Diagnosis not present

## 2019-06-06 DIAGNOSIS — Z23 Encounter for immunization: Secondary | ICD-10-CM | POA: Diagnosis not present

## 2019-06-14 ENCOUNTER — Telehealth: Payer: Self-pay | Admitting: Internal Medicine

## 2019-06-14 NOTE — Telephone Encounter (Signed)
Daughter of the patient called and wanted to set up a transmission schedule for her mom.

## 2019-06-14 NOTE — Telephone Encounter (Signed)
LMOVM for pt daughter Joelene Millin to call my direct office number to send a transmission with home monitor.

## 2019-06-15 NOTE — Telephone Encounter (Signed)
I spoke with the pt daughter and she states she would like the pt to have a monitor that does not have to be plugged into a landline. I gave her the number to [facility] support to order the new monitor. I told once she get the new monitor and sends the transmission I will put the pt on the remote schedule. I gave her my direct office number to call to get assistance.

## 2019-06-19 NOTE — Telephone Encounter (Signed)
Monitor ordered and shipped 06/18/2019

## 2019-06-29 NOTE — Telephone Encounter (Signed)
The pt daughter Jamie [name] states they did receive the monitor. She states she was waiting on getting her vaccine which she is getting this weekend. She will call me next week to schedule a transmission with the monitor.

## 2019-07-05 NOTE — Telephone Encounter (Signed)
Called to see if patient was able to schedule a transmission, no answer. LMOVM.

## 2019-07-11 NOTE — Telephone Encounter (Signed)
Called patient to check on monitor. No answer. LMOVM.

## 2019-07-12 NOTE — Telephone Encounter (Signed)
Letter sent 07/12/2019 

## 2019-07-16 ENCOUNTER — Encounter: Payer: Self-pay | Admitting: Internal Medicine

## 2019-07-26 ENCOUNTER — Ambulatory Visit (INDEPENDENT_AMBULATORY_CARE_PROVIDER_SITE_OTHER): Payer: Medicare Other | Admitting: *Deleted

## 2019-07-26 DIAGNOSIS — I442 Atrioventricular block, complete: Secondary | ICD-10-CM

## 2019-07-26 LAB — CUP PACEART REMOTE DEVICE CHECK
Battery Impedance: 5384 Ohm
Battery Remaining Longevity: 7 mo
Battery Voltage: 2.66 V
Brady Statistic AP VP Percent: 0 %
Brady Statistic AP VS Percent: 0 %
Brady Statistic AS VP Percent: 100 %
Brady Statistic AS VS Percent: 0 %
Date Time Interrogation Session: 20210513101447
Implantable Lead Implant Date: 20020116
Implantable Lead Implant Date: 20020116
Implantable Lead Location: 753859
Implantable Lead Location: 753860
Implantable Lead Model: 5076
Implantable Pulse Generator Implant Date: 20101108
Lead Channel Impedance Value: 425 Ohm
Lead Channel Impedance Value: 67 Ohm
Lead Channel Pacing Threshold Amplitude: 1 V
Lead Channel Pacing Threshold Pulse Width: 0.4 ms
Lead Channel Setting Pacing Amplitude: 2.5 V
Lead Channel Setting Pacing Pulse Width: 0.4 ms
Lead Channel Setting Sensing Sensitivity: 11.2 mV

## 2019-07-27 ENCOUNTER — Telehealth: Payer: Self-pay | Admitting: *Deleted

## 2019-07-27 NOTE — Telephone Encounter (Signed)
  Patient Consent for Virtual Visit         Jamie [name] [facility], daughter) has provided verbal consent on 07/27/2019 for a virtual visit (video or telephone).   CONSENT FOR VIRTUAL VISIT FOR:  Jamie [name]  By participating in this virtual visit I agree to the following:  I hereby voluntarily request, consent and authorize [place] and its employed or contracted physicians, physician assistants, nurse practitioners or other licensed health care professionals (the Practitioner), to provide me with telemedicine health care services (the "Services") as deemed necessary by the treating Practitioner. I acknowledge and consent to receive the Services by the Practitioner via telemedicine. I understand that the telemedicine visit will involve communicating with the Practitioner through live audiovisual communication technology and the disclosure of certain medical information by electronic transmission. I acknowledge that I have been given the opportunity to request an in-person assessment or other available alternative prior to the telemedicine visit and am voluntarily participating in the telemedicine visit.  I understand that I have the right to withhold or withdraw my consent to the use of telemedicine in the course of my care at any time, without affecting my right to future care or treatment, and that the Practitioner or I may terminate the telemedicine visit at any time. I understand that I have the right to inspect all information obtained and/or recorded in the course of the telemedicine visit and may receive copies of available information for a reasonable fee.  I understand that some of the potential risks of receiving the Services via telemedicine include:  Marland Kitchen Delay or interruption in medical evaluation due to technological equipment failure or disruption; . Information transmitted may not be sufficient (e.g. poor resolution of images) to allow for appropriate medical decision  making by the Practitioner; and/or  . In rare instances, security protocols could fail, causing a breach of personal health information.  Furthermore, I acknowledge that it is my responsibility to provide information about my medical history, conditions and care that is complete and accurate to the best of my ability. I acknowledge that Practitioner's advice, recommendations, and/or decision may be based on factors not within their control, such as incomplete or inaccurate data provided by me or distortions of diagnostic images or specimens that may result from electronic transmissions. I understand that the practice of medicine is not an exact science and that Practitioner makes no warranties or guarantees regarding treatment outcomes. I acknowledge that a copy of this consent can be made available to me via my patient portal ([place]), or I can request a printed copy by calling the office of [place].    I understand that my insurance will be billed for this visit.   I have read or had this consent read to me. . I understand the contents of this consent, which adequately explains the benefits and risks of the Services being provided via telemedicine.  . I have been provided ample opportunity to ask questions regarding this consent and the Services and have had my questions answered to my satisfaction. . I give my informed consent for the services to be provided through the use of telemedicine in my medical care

## 2019-07-27 NOTE — Telephone Encounter (Signed)
Spoke with patient's daughter, Jamie [name] ([place]). Advised that PPM transmission from 07/26/19 shows PPM battery estimating 7 months until ERI. Scheduled for monthly battery checks beginning 08/27/19. Also discussed persistent AF since ~03/2019. Scheduled for overdue f/u with Dr. Caryl Comes on 08/09/19 via video visit per [name]'s preference. Consent obtained. [name] appreciative of call and denies questions or concerns at this time.

## 2019-07-30 NOTE — Progress Notes (Signed)
Remote pacemaker transmission.   

## 2019-07-31 DIAGNOSIS — R8290 Unspecified abnormal findings in urine: Secondary | ICD-10-CM | POA: Diagnosis not present

## 2019-08-09 ENCOUNTER — Telehealth (INDEPENDENT_AMBULATORY_CARE_PROVIDER_SITE_OTHER): Payer: Medicare Other | Admitting: Internal Medicine

## 2019-08-09 ENCOUNTER — Telehealth: Payer: Self-pay

## 2019-08-09 ENCOUNTER — Other Ambulatory Visit: Payer: Self-pay

## 2019-08-09 VITALS — BP 129/66 | HR 73 | Ht 63.0 in | Wt 96.0 lb

## 2019-08-09 DIAGNOSIS — I442 Atrioventricular block, complete: Secondary | ICD-10-CM | POA: Diagnosis not present

## 2019-08-09 DIAGNOSIS — Z950 Presence of cardiac pacemaker: Secondary | ICD-10-CM | POA: Diagnosis not present

## 2019-08-09 DIAGNOSIS — I951 Orthostatic hypotension: Secondary | ICD-10-CM

## 2019-08-09 MED ORDER — APIXABAN 2.5 MG PO TABS
2.5000 mg | ORAL_TABLET | Freq: Two times a day (BID) | ORAL | 3 refills | Status: DC
Start: 2019-08-09 — End: 2020-08-20

## 2019-08-09 NOTE — Telephone Encounter (Signed)
  Patient Consent for Virtual Visit         Jamie [name] has provided verbal consent on 08/09/2019 for a virtual visit (video or telephone).   CONSENT FOR VIRTUAL VISIT FOR:  Jamie [name]  By participating in this virtual visit I agree to the following:  I hereby voluntarily request, consent and authorize [place] and its employed or contracted physicians, physician assistants, nurse practitioners or other licensed health care professionals (the Practitioner), to provide me with telemedicine health care services (the "Services") as deemed necessary by the treating Practitioner. I acknowledge and consent to receive the Services by the Practitioner via telemedicine. I understand that the telemedicine visit will involve communicating with the Practitioner through live audiovisual communication technology and the disclosure of certain medical information by electronic transmission. I acknowledge that I have been given the opportunity to request an in-person assessment or other available alternative prior to the telemedicine visit and am voluntarily participating in the telemedicine visit.  I understand that I have the right to withhold or withdraw my consent to the use of telemedicine in the course of my care at any time, without affecting my right to future care or treatment, and that the Practitioner or I may terminate the telemedicine visit at any time. I understand that I have the right to inspect all information obtained and/or recorded in the course of the telemedicine visit and may receive copies of available information for a reasonable fee.  I understand that some of the potential risks of receiving the Services via telemedicine include:  Marland Kitchen Delay or interruption in medical evaluation due to technological equipment failure or disruption; . Information transmitted may not be sufficient (e.g. poor resolution of images) to allow for appropriate medical decision making by the Practitioner;  and/or  . In rare instances, security protocols could fail, causing a breach of personal health information.  Furthermore, I acknowledge that it is my responsibility to provide information about my medical history, conditions and care that is complete and accurate to the best of my ability. I acknowledge that Practitioner's advice, recommendations, and/or decision may be based on factors not within their control, such as incomplete or inaccurate data provided by me or distortions of diagnostic images or specimens that may result from electronic transmissions. I understand that the practice of medicine is not an exact science and that Practitioner makes no warranties or guarantees regarding treatment outcomes. I acknowledge that a copy of this consent can be made available to me via my patient portal ([place]), or I can request a printed copy by calling the office of [place].    I understand that my insurance will be billed for this visit.   I have read or had this consent read to me. . I understand the contents of this consent, which adequately explains the benefits and risks of the Services being provided via telemedicine.  . I have been provided ample opportunity to ask questions regarding this consent and the Services and have had my questions answered to my satisfaction. . I give my informed consent for the services to be provided through the use of telemedicine in my medical care

## 2019-08-09 NOTE — Patient Instructions (Addendum)
Medication Instructions:  Your physician has recommended you make the following change in your medication:   ** Increase Lasix to 40mg  1 tablet twice daily x 3 days - then return to your normal doasge  ** Begin taking Eliquis (Apixaban) 2.5mg  1 tablet by mouth twice daily.   Labwork: None ordered.  Testing/Procedures: Your physician has recommended that you have a Cardioversion (DCCV). Electrical Cardioversion uses a jolt of electricity to your heart either through paddles or wired patches attached to your chest. This is a controlled, usually prescheduled, procedure. Defibrillation is done under light anesthesia in the hospital, and you usually go home the day of the procedure. This is done to get your heart back into a normal rhythm. You are not awake for the procedure. Please see the instruction sheet given to you today.   Follow-Up: Your physician wants you to follow-up in: 6 months. You will receive a reminder letter in the mail two months in advance. If you don't receive a letter, please call our office to schedule the follow-up appointment.  Remote monitoring is used to monitor your Pacemaker of ICD from home. This monitoring reduces the number of office visits required to check your device to one time per year. It allows [place] to keep an eye on the functioning of your device to ensure it is working properly.   Any Other Special Instructions Will Be Listed Below (If Applicable).  Dear [name] are scheduled for a Cardioversion on Wednesday, June 23rd, 2021 with Dr. Radford Pax.  Please arrive at the [facility] (Main Entrance A) at [facility]: [place], [place] [address] at 730am.   DIET: Nothing to eat or drink after midnight except a sip of water with medications (see medication instructions below)  Medication Instructions:  Do not take Furosemide the morning of your procedure.  Continue your anticoagulant: Eliquis You will need to continue your  anticoagulant after your             procedure until you are told by your Provider that it is               safe to stop.   Labs: CBC and BMET  Your lab work will be done at the hospital the morning of your procedure.  Your Pre-procedure COVID-19 Testing will be done on 09/01/2019 at 1205pm at [facility] at [id number], [place], [place] [address]. Once you arrive at the testing site, stay in the right hand lane, go under the building overhang not the tent. If you are tested under the tent your results may not be back before your procedure. Please be on time for your appointment.  After your swab you will be given a mask to wear and instructed to go home and quarantine/no visitors until after your procedure. If you test positive you will be notified and your procedure will be cancelled.      You must have a responsible person to drive you home and stay in the waiting area during your procedure. Failure to do so could result in cancellation.  Bring your insurance cards.  *Special Note: Every effort is made to have your procedure done on time. Occasionally there are emergencies that occur at the hospital that may cause delays. Please be patient if a delay does occur.    If you need a refill on your cardiac medications before your next appointment, please call your pharmacy.

## 2019-08-09 NOTE — Progress Notes (Signed)
Electrophysiology TeleHealth Note   Due to national recommendations of social distancing due to COVID 19, an audio/video telehealth visit is felt to be most appropriate for this patient at this time.  See MyChart message from today for the patient's consent to telehealth for [facility].   Date:  08/09/2019   ID:  Jamie [name], DOB 03-09-1923, MRN [id number]  Location: patient's home  Provider location: [address] [place]  Evaluation Performed: Follow-up visit  PCP:  Crist Infante, MD  Cardiologist:     Electrophysiologist:  Renaldo Reel Nilda Simmer De[place]acey block  Chief Complaint:  Complete heart block   History of Present Illness:     Jamie [name] is a 86 y.o. female  presents via audio/video conferencing for a telehealth visit today.  Since last being seen in our clinic for folllowup for complete heart block for which she is status post pacemaker implantation  rogrammed in the VDD mode and  underwent device generator replacement fall 2010   She reports worsening shortness breath and fatigue with some lightheadedness No chest pain  Oxygen dependent COPD     She also has CAD with complete LAD infarc     Date Cr K Hgb  3/21 1.1 4.0 12.9           Catheterization>>> no obstructive coronary disease. (August 2012)    DATE TEST EF   2012 [facility]  No obstructive CAD  9/15 Echo   40% %   3/15 Myoview 60% AnteriorApical  Scar  3/18 Echo   55-65 %         Persistent atrial fibrillation   , the patient reports worsening of shortness and edema and more edema  Bleeding - no issues   Thromboembolic risk factors ( age  -2 , HTN-1 , Vasc disease -1 , CHF-1  Gender-1 ) for a CHADSVASc Score of 6  The patient denies symptoms of fevers, chills, cough, or new SOB worrisome for COVID 19.  Got Covid vaccine  Past Medical History:  Diagnosis Date  . Asthma   . Carotid stenosis    a. Carotid US (9/15):  Bilateral ICA 1-39%  . Chronic combined systolic and  diastolic CHF (congestive heart failure) ([place])    a. EF 40-45% in 11/2013, improved to 55-60% in 05/2016.  Marland Kitchen Complete AV block ([place])   . COPD (chronic obstructive pulmonary disease) ([place])   . Family history of anesthesia complication    SISTER HAS DIFFICULTY WAKING  . GERD (gastroesophageal reflux disease)   . Hypertension   . Ischemic cardiomyopathy    a.  Echo (9/15):  EF 40-45%, ant and apical and inferoapical HK, Gr 1 DD, trivial MR, mild TR, PASP 23 mmHg;  b. Myoview (9/15) large scar in mid LAD territory, no peri-infarct ischemia, EF 60%, apical AK; intermediate risk >> patient opted for med Rx. b. EF 55-60% by echo 05/2016.  . Orthostasis   . Osteoarthritis   . Osteopenia   . Pacemaker   . Pneumonia   . Pre-syncope   . Presence of permanent cardiac pacemaker   . Shortness of breath   . Tachycardia   . TIA (transient ischemic attack)    a. possible TIA 11/2013.  Marland Kitchen UTI (urinary tract infection)     Past Surgical History:  Procedure Laterality Date  . CHOLECYSTECTOMY    . EYE SURGERY  10/2004  . HERNIA REPAIR  11/2002  . PACEMAKER PLACEMENT     Medtronic  Current Outpatient Medications  Medication Sig Dispense Refill  . albuterol (PROAIR HFA) 108 (90 BASE) MCG/ACT inhaler Inhale 2 puffs into the lungs every 6 (six) hours as needed for wheezing or shortness of breath.     . ALPRAZolam (XANAX) 0.5 MG tablet Take 0.5 mg by mouth at bedtime as needed for sleep.     . beta carotene w/minerals (OCUVITE) tablet Take 1 tablet by mouth daily.    . Calcium Carbonate (CALTRATE 600) 1500 MG TABS Take 600 mg by mouth daily.     . cholecalciferol (VITAMIN D) 1000 UNITS tablet Take 1,000 Units by mouth daily.      . cyanocobalamin 1000 MCG tablet Take 1,000 mcg by mouth daily.     . furosemide (LASIX) 40 MG tablet Take 40 mg by mouth.    . levalbuterol (XOPENEX) 0.63 MG/3ML nebulizer solution Take 0.63 mg by nebulization every 6 (six) hours as needed for shortness of breath.    . losartan  (COZAAR) 25 MG tablet Take 1 tablet (25 mg total) by mouth daily. Please make yearly appt with Dr. Caryl Comes for December before anymore refills. 1st attempt 90 tablet 0  . Magnesium 250 MG TABS Take 250 mg by mouth daily.     . pantoprazole (PROTONIX) 40 MG tablet Take 40 mg by mouth daily.  3  . Polyethyl Glycol-Propyl Glycol (SYSTANE) 0.4-0.3 % SOLN Place 1 drop into both eyes 2 (two) times daily as needed (dry eyes).    . potassium chloride (K-DUR) 10 MEQ tablet Take 10 mEq by mouth 2 (two) times a week. Tuesday and Friday    . Coenzyme Q10 (CO Q 10 PO) Take 1 capsule by mouth daily.    . nitroGLYCERIN (NITROSTAT) 0.4 MG SL tablet Place 1 tablet (0.4 mg total) under the tongue every 5 (five) minutes as needed for chest pain. (Patient not taking: Reported on 08/09/2019) 15 tablet 12   No current facility-administered medications for this visit.    Allergies:   Ace inhibitors and Hydrocodone-acetaminophen   Social History:  The patient  reports that she quit smoking about 32 years ago. Her smoking use included cigarettes. She has a 35.00 pack-year smoking history. She has never used smokeless tobacco. She reports that she does not drink alcohol or use drugs.   Family History:  The patient's   family history includes Breast cancer in her sister; Heart disease in her sister.   ROS:  Please see the history of present illness.   All other systems are personally reviewed and negative.    Exam:    Vital Signs:  BP 129/66   Pulse 73   Ht 5\' 3"  (1.6 m)   Wt 96 lb (43.5 kg)   BMI 17.01 kg/m     Well appearing, alert and conversant, regular work of breathing,  good skin color Eyes- anicteric, neuro- grossly intact, skin- no apparent rash or lesions or cyanosis, mouth- oral mucosa is pink   Labs/Other Tests and Data Reviewed:    Recent Labs: No results found for requested labs within last 8760 hours.   Wt Readings from Last 3 Encounters:  08/09/19 96 lb (43.5 kg)  03/09/18 101 lb (45.8 kg)   02/26/18 102 lb (46.3 kg)     Other studies personally reviewed: Additional studies/ records that were reviewed today include  Last device remote is reviewed from [facility] PDF dated 5/21 which reveals normal device function,   arrhythmias - persistent Atrial fib    Battery approaching ERI  defvice programmed VDD    ASSESSMENT & PLAN:   Coronary artery disease with prior MI  Complete heart block  Pacemaker-Medtronic     Atrial fibrillation new persistent  Statin intolerance\  Congestive heart failure acute chronic new   Volume overloaded and in persistent atrial fibrillation   Will augment diuresis See Below and begin anticoagulation We discussed the use of the NOACs compared to Coumadin and their role is stroke risk reduction in atrial fibrillation  We briefly reviewed the data of at least comparability in stroke prevention, bleeding and outcome. We discussed some of the new once wherein somewhat associated with decreased ischemic stroke risk, one to be taken daily, and has been shown to be comparable and bleeding risk to aspirin.  We also discussed bleeding associated with warfarin as well as NOACs and a wall bleeding as a complication of all these drugs intracranial bleeding is more frequently associated with warfarin then the NOACs and a GI bleeding is more commonly associated with the latter   Plan will be to undertake DCCV in a few weeks and hopefully sinus rhythm will be assoc with improved exercise performance   No overt bleeding risks    COVID 19 screen The patient denies symptoms of COVID 19 at this time.  The importance of social distancing was discussed today.  Follow-up:  54m Next remote: As Scheduled *  Current medicines are reviewed at length with the patient today.   The patient does not have concerns regarding her medicines.  The following changes were made today:  Furosemide 40 bid x 3d Begin Apixoban  2.5 mg bid   Labs/ tests ordered today include:  DCCV in 3 weeks  No orders of the defined types were placed in this encounter.    Patient Risk:  after full review of this patients clinical status, I feel that they are at moderate risk at this time.  Today, I have spent 25  minutes with the patient with telehealth technology discussing the above.  Signed, Virl Axe, MD  08/09/2019 4:06 PM     [place] [address] [place] [address] [phone] (office) [phone] (fax)

## 2019-08-22 ENCOUNTER — Telehealth: Payer: Self-pay

## 2019-08-22 NOTE — Telephone Encounter (Signed)
Left voicemail message with pt and pt's daughter's to contact RN at [phone].

## 2019-08-23 NOTE — Telephone Encounter (Signed)
Spoke with pt's daughter, Joelene Millin [facility]) who states pt has decided she does not want to have DCCV.  Pt will continue Eliquis.  Offered to schedule virtual appointment with Dr Caryl Comes to discuss other possible treatment options (if any)  Pt's daughter reports pt feels well with no current symptoms so declines appointment. DCCV canceled for 09/05/2019.  Will forward information to Dr Caryl Comes for review and recommendation.  Pt's daughter verbalizes understanding and agrees with current plan.

## 2019-08-24 NOTE — Telephone Encounter (Signed)
I am ok with that , hte issue for DCCV is mostly about patient symptoms Thanks SK

## 2019-08-27 ENCOUNTER — Ambulatory Visit (INDEPENDENT_AMBULATORY_CARE_PROVIDER_SITE_OTHER): Payer: Medicare Other | Admitting: *Deleted

## 2019-08-27 DIAGNOSIS — I255 Ischemic cardiomyopathy: Secondary | ICD-10-CM

## 2019-08-30 LAB — CUP PACEART REMOTE DEVICE CHECK
Battery Impedance: 5643 Ohm
Battery Remaining Longevity: 6 mo
Battery Voltage: 2.64 V
Brady Statistic AP VP Percent: 0 %
Brady Statistic AP VS Percent: 0 %
Brady Statistic AS VP Percent: 100 %
Brady Statistic AS VS Percent: 0 %
Date Time Interrogation Session: 20210617164341
Implantable Lead Implant Date: 20020116
Implantable Lead Implant Date: 20020116
Implantable Lead Location: 753859
Implantable Lead Location: 753860
Implantable Lead Model: 5076
Implantable Pulse Generator Implant Date: 20101108
Lead Channel Impedance Value: 440 Ohm
Lead Channel Impedance Value: 67 Ohm
Lead Channel Pacing Threshold Amplitude: 0.875 V
Lead Channel Pacing Threshold Pulse Width: 0.4 ms
Lead Channel Setting Pacing Amplitude: 2.5 V
Lead Channel Setting Pacing Pulse Width: 0.4 ms
Lead Channel Setting Sensing Sensitivity: 11.2 mV

## 2019-09-01 ENCOUNTER — Other Ambulatory Visit (HOSPITAL_COMMUNITY): Payer: Medicare Other

## 2019-09-03 NOTE — Addendum Note (Signed)
Addended by: Douglass Rivers D on: 09/03/2019 12:48 PM   Modules accepted: Level of Service

## 2019-09-03 NOTE — Progress Notes (Signed)
Remote pacemaker transmission.   

## 2019-09-05 ENCOUNTER — Encounter (HOSPITAL_COMMUNITY): Admission: RE | Payer: Self-pay | Source: Home / Self Care

## 2019-09-05 ENCOUNTER — Ambulatory Visit (HOSPITAL_COMMUNITY): Admission: RE | Admit: 2019-09-05 | Payer: Medicare Other | Source: Home / Self Care | Admitting: Cardiology

## 2019-09-05 SURGERY — CARDIOVERSION
Anesthesia: General

## 2019-09-27 ENCOUNTER — Ambulatory Visit (INDEPENDENT_AMBULATORY_CARE_PROVIDER_SITE_OTHER): Payer: Medicare Other | Admitting: *Deleted

## 2019-09-27 DIAGNOSIS — I5031 Acute diastolic (congestive) heart failure: Secondary | ICD-10-CM

## 2019-09-28 LAB — CUP PACEART REMOTE DEVICE CHECK
Battery Impedance: 6124 Ohm
Battery Remaining Longevity: 4 mo
Battery Voltage: 2.62 V
Brady Statistic AP VP Percent: 0 %
Brady Statistic AP VS Percent: 0 %
Brady Statistic AS VP Percent: 100 %
Brady Statistic AS VS Percent: 0 %
Date Time Interrogation Session: 20210715205656
Implantable Lead Implant Date: 20020116
Implantable Lead Implant Date: 20020116
Implantable Lead Location: 753859
Implantable Lead Location: 753860
Implantable Lead Model: 5076
Implantable Pulse Generator Implant Date: 20101108
Lead Channel Impedance Value: 421 Ohm
Lead Channel Impedance Value: 67 Ohm
Lead Channel Pacing Threshold Amplitude: 1.125 V
Lead Channel Pacing Threshold Pulse Width: 0.4 ms
Lead Channel Setting Pacing Amplitude: 2.5 V
Lead Channel Setting Pacing Pulse Width: 0.4 ms
Lead Channel Setting Sensing Sensitivity: 11.2 mV

## 2019-09-28 NOTE — Addendum Note (Signed)
Addended by: Douglass Rivers D on: 09/28/2019 04:39 PM   Modules accepted: Level of Service

## 2019-09-28 NOTE — Progress Notes (Signed)
Remote pacemaker transmission.   

## 2019-10-09 ENCOUNTER — Encounter (INDEPENDENT_AMBULATORY_CARE_PROVIDER_SITE_OTHER): Payer: Medicare Other | Admitting: Ophthalmology

## 2019-10-19 DIAGNOSIS — M160 Bilateral primary osteoarthritis of hip: Secondary | ICD-10-CM | POA: Diagnosis not present

## 2019-10-19 DIAGNOSIS — K219 Gastro-esophageal reflux disease without esophagitis: Secondary | ICD-10-CM | POA: Diagnosis not present

## 2019-10-19 DIAGNOSIS — R7301 Impaired fasting glucose: Secondary | ICD-10-CM | POA: Diagnosis not present

## 2019-10-19 DIAGNOSIS — J45909 Unspecified asthma, uncomplicated: Secondary | ICD-10-CM | POA: Diagnosis not present

## 2019-10-19 DIAGNOSIS — I2510 Atherosclerotic heart disease of native coronary artery without angina pectoris: Secondary | ICD-10-CM | POA: Diagnosis not present

## 2019-10-19 DIAGNOSIS — Z950 Presence of cardiac pacemaker: Secondary | ICD-10-CM | POA: Diagnosis not present

## 2019-10-19 DIAGNOSIS — J449 Chronic obstructive pulmonary disease, unspecified: Secondary | ICD-10-CM | POA: Diagnosis not present

## 2019-10-19 DIAGNOSIS — I10 Essential (primary) hypertension: Secondary | ICD-10-CM | POA: Diagnosis not present

## 2019-10-19 DIAGNOSIS — R0602 Shortness of breath: Secondary | ICD-10-CM | POA: Diagnosis not present

## 2019-10-19 DIAGNOSIS — M810 Age-related osteoporosis without current pathological fracture: Secondary | ICD-10-CM | POA: Diagnosis not present

## 2019-10-19 DIAGNOSIS — I4891 Unspecified atrial fibrillation: Secondary | ICD-10-CM | POA: Diagnosis not present

## 2019-10-25 ENCOUNTER — Ambulatory Visit (INDEPENDENT_AMBULATORY_CARE_PROVIDER_SITE_OTHER): Payer: Medicare Other | Admitting: *Deleted

## 2019-10-25 DIAGNOSIS — I442 Atrioventricular block, complete: Secondary | ICD-10-CM

## 2019-10-25 LAB — CUP PACEART REMOTE DEVICE CHECK
Battery Impedance: 7296 Ohm
Battery Remaining Longevity: 1 mo
Battery Voltage: 2.59 V
Brady Statistic AP VP Percent: 0 %
Brady Statistic AP VS Percent: 0 %
Brady Statistic AS VP Percent: 100 %
Brady Statistic AS VS Percent: 0 %
Date Time Interrogation Session: 20210812155011
Implantable Lead Implant Date: 20020116
Implantable Lead Implant Date: 20020116
Implantable Lead Location: 753859
Implantable Lead Location: 753860
Implantable Lead Model: 5076
Implantable Pulse Generator Implant Date: 20101108
Lead Channel Impedance Value: 437 Ohm
Lead Channel Impedance Value: 67 Ohm
Lead Channel Pacing Threshold Amplitude: 1.25 V
Lead Channel Pacing Threshold Pulse Width: 0.4 ms
Lead Channel Setting Pacing Amplitude: 2.5 V
Lead Channel Setting Pacing Pulse Width: 0.4 ms
Lead Channel Setting Sensing Sensitivity: 11.2 mV

## 2019-10-29 NOTE — Progress Notes (Signed)
Remote pacemaker transmission.   

## 2019-11-21 DIAGNOSIS — R300 Dysuria: Secondary | ICD-10-CM | POA: Diagnosis not present

## 2019-11-26 ENCOUNTER — Ambulatory Visit (INDEPENDENT_AMBULATORY_CARE_PROVIDER_SITE_OTHER): Payer: Medicare Other | Admitting: *Deleted

## 2019-11-26 DIAGNOSIS — I442 Atrioventricular block, complete: Secondary | ICD-10-CM

## 2019-11-27 LAB — CUP PACEART REMOTE DEVICE CHECK
Battery Impedance: 8495 Ohm
Battery Voltage: 2.59 V
Brady Statistic RV Percent Paced: 99 %
Date Time Interrogation Session: 20210913152804
Implantable Lead Implant Date: 20020116
Implantable Lead Implant Date: 20020116
Implantable Lead Location: 753859
Implantable Lead Location: 753860
Implantable Lead Model: 5076
Implantable Pulse Generator Implant Date: 20101108
Lead Channel Impedance Value: 446 Ohm
Lead Channel Impedance Value: 67 Ohm
Lead Channel Setting Pacing Amplitude: 2.75 V
Lead Channel Setting Pacing Pulse Width: 0.4 ms
Lead Channel Setting Sensing Sensitivity: 11.2 mV

## 2019-11-28 ENCOUNTER — Telehealth: Payer: Self-pay | Admitting: Emergency Medicine

## 2019-11-28 DIAGNOSIS — I4891 Unspecified atrial fibrillation: Secondary | ICD-10-CM | POA: Insufficient documentation

## 2019-11-28 NOTE — Telephone Encounter (Signed)
Spoke with Joelene Millin( daughter) per [facility] ([facility]) and notified that patient's device is at [facility]. Family requested video/phone visit to discuss gen change if possible. Will forward to have patient scheduled to discuss the procedure.

## 2019-11-28 NOTE — Addendum Note (Signed)
Addended by: Douglass Rivers D on: 11/28/2019 01:05 PM   Modules accepted: Level of Service

## 2019-11-28 NOTE — Progress Notes (Signed)
Remote pacemaker transmission.   

## 2019-11-29 ENCOUNTER — Telehealth (INDEPENDENT_AMBULATORY_CARE_PROVIDER_SITE_OTHER): Payer: Medicare Other | Admitting: Internal Medicine

## 2019-11-29 ENCOUNTER — Other Ambulatory Visit: Payer: Self-pay

## 2019-11-29 VITALS — BP 127/64 | HR 71 | Wt 95.0 lb

## 2019-11-29 DIAGNOSIS — I255 Ischemic cardiomyopathy: Secondary | ICD-10-CM

## 2019-11-29 DIAGNOSIS — Z950 Presence of cardiac pacemaker: Secondary | ICD-10-CM

## 2019-11-29 DIAGNOSIS — I4819 Other persistent atrial fibrillation: Secondary | ICD-10-CM

## 2019-11-29 DIAGNOSIS — I442 Atrioventricular block, complete: Secondary | ICD-10-CM

## 2019-11-29 NOTE — Progress Notes (Signed)
Electrophysiology TeleHealth Note   Due to national recommendations of social distancing due to COVID 19, an audio/video telehealth visit is felt to be most appropriate for this patient at this time.  See MyChart message from today for the patient's consent to telehealth for [facility].   Date:  11/29/2019   ID:  Jamie [name], DOB 06-Dec-1922, MRN [id number]  Location: patient's home  Provider location: [address] [place]  Evaluation Performed: Follow-up visit  PCP:  Crist Infante, MD  Cardiologist:    Electrophysiologist:  SK   Chief Complaint:  Pacemaker at ERI  History of Present Illness:    Jamie [name] is a 86 y.o. female who presents via audio/video conferencing for a telehealth visit today.  Since last being seen in our clinic for complete heart block and pacer approaching ERI, interval persistent atrial fibrillation for which DCCV was planned and then declined and O2 dept COPD, the patient reports perhaps more fatigue but no more dyspneic   She also has CAD with complete LAD infarc, no chest pain     Date Cr K Hgb  3/21 1.1 4.0 12.9           Catheterization>>> no obstructive coronary disease. (August 2012)  DATE TEST EF   2012 [facility]  No obstructive CAD  9/15 Echo 40%%   3/15 Myoview 60% AnteriorApical Scar  3/18 Echo 55-65%           The patient denies symptoms of fevers, chills, cough, or new SOB worrisome for COVID 19.   Past Medical History:  Diagnosis Date  . Asthma   . Carotid stenosis    a. Carotid US (9/15):  Bilateral ICA 1-39%  . Chronic combined systolic and diastolic CHF (congestive heart failure) ([place])    a. EF 40-45% in 11/2013, improved to 55-60% in 05/2016.  Marland Kitchen Complete AV block ([place])   . COPD (chronic obstructive pulmonary disease) ([place])   . Family history of anesthesia complication    SISTER HAS DIFFICULTY WAKING  . GERD (gastroesophageal reflux disease)   . Hypertension   . Ischemic  cardiomyopathy    a.  Echo (9/15):  EF 40-45%, ant and apical and inferoapical HK, Gr 1 DD, trivial MR, mild TR, PASP 23 mmHg;  b. Myoview (9/15) large scar in mid LAD territory, no peri-infarct ischemia, EF 60%, apical AK; intermediate risk >> patient opted for med Rx. b. EF 55-60% by echo 05/2016.  . Orthostasis   . Osteoarthritis   . Osteopenia   . Pacemaker   . Pneumonia   . Pre-syncope   . Presence of permanent cardiac pacemaker   . Shortness of breath   . Tachycardia   . TIA (transient ischemic attack)    a. possible TIA 11/2013.  Marland Kitchen UTI (urinary tract infection)     Past Surgical History:  Procedure Laterality Date  . CHOLECYSTECTOMY    . EYE SURGERY  10/2004  . HERNIA REPAIR  11/2002  . PACEMAKER PLACEMENT     Medtronic    Current Outpatient Medications  Medication Sig Dispense Refill  . albuterol (PROAIR HFA) 108 (90 BASE) MCG/ACT inhaler Inhale 2 puffs into the lungs every 6 (six) hours as needed for wheezing or shortness of breath.     . ALPRAZolam (XANAX) 0.5 MG tablet Take 0.5 mg by mouth at bedtime as needed for sleep.     Marland Kitchen apixaban (ELIQUIS) 2.5 MG TABS tablet Take 1 tablet (2.5 mg total) by  mouth 2 (two) times daily. 180 tablet 3  . beta carotene w/minerals (OCUVITE) tablet Take 1 tablet by mouth daily.    . Calcium Carbonate (CALTRATE 600) 1500 MG TABS Take 600 mg by mouth daily.     . cefdinir (OMNICEF) 300 MG capsule Take 300 mg by mouth 2 (two) times daily. For UTI    . cholecalciferol (VITAMIN D) 1000 UNITS tablet Take 1,000 Units by mouth daily.      . cyanocobalamin 1000 MCG tablet Take 1,000 mcg by mouth daily.     . furosemide (LASIX) 40 MG tablet Take 40 mg by mouth.    . levalbuterol (XOPENEX) 0.63 MG/3ML nebulizer solution Take 0.63 mg by nebulization every 6 (six) hours as needed for shortness of breath.    . losartan (COZAAR) 25 MG tablet Take 1 tablet (25 mg total) by mouth daily. Please make yearly appt with Dr. Caryl Comes for December before anymore  refills. 1st attempt 90 tablet 0  . meloxicam (MOBIC) 7.5 MG tablet Take 7.5 mg by mouth daily.    . nitroGLYCERIN (NITROSTAT) 0.4 MG SL tablet Place 1 tablet (0.4 mg total) under the tongue every 5 (five) minutes as needed for chest pain. 15 tablet 12  . pantoprazole (PROTONIX) 40 MG tablet Take 40 mg by mouth daily.  3  . Polyethyl Glycol-Propyl Glycol (SYSTANE) 0.4-0.3 % SOLN Place 1 drop into both eyes 2 (two) times daily as needed (dry eyes).    . potassium chloride (K-DUR) 10 MEQ tablet Take 10 mEq by mouth 2 (two) times a week. Tuesday and Friday    . Coenzyme Q10 (CO Q 10 PO) Take 1 capsule by mouth daily. (Patient not taking: Reported on 11/29/2019)    . Magnesium 250 MG TABS Take 250 mg by mouth daily.  (Patient not taking: Reported on 11/29/2019)     No current facility-administered medications for this visit.    Allergies:   Ace inhibitors and Hydrocodone-acetaminophen   Social History:  The patient  reports that she quit smoking about 32 years ago. Her smoking use included cigarettes. She has a 35.00 pack-year smoking history. She has never used smokeless tobacco. She reports that she does not drink alcohol and does not use drugs.   Family History:  The patient's   family history includes Breast cancer in her sister; Heart disease in her sister.   ROS:  Please see the history of present illness.   All other systems are personally reviewed and negative.    Exam:    Vital Signs:  BP 127/64   Pulse 71   Wt 95 lb (43.1 kg)   SpO2 95%   BMI 16.83 kg/m       Labs/Other Tests and Data Reviewed:    Recent Labs: No results found for requested labs within last 8760 hours.   Wt Readings from Last 3 Encounters:  11/29/19 95 lb (43.1 kg)  08/09/19 96 lb (43.5 kg)  03/09/18 101 lb (45.8 kg)     Other studies personally reviewed:    Last device remote is reviewed from [place] PDF dated 9/21 >> device has reached ERI   ASSESSMENT & PLAN:   Coronary artery disease with  prior MI  Complete heart block  Pacemaker-Medtronic     Statin intolerance  Syncope -orthostatic  Hypertension   Persistent atrial fibrillation   Device at ERI and she is in need of device generator replacement   Discussion about DCCV--the pt declines although her daughter was strongly in  favor-- in the absence of clearly attributable symptoms I would not push to do DCCV   Would hold the Apixoban  For two doses before the procedure  We have reviewed the benefits and risks of generator replacement.  These include but are not limited to lead fracture and infection.  The patient understands, agrees and is willing to proceed.      COVID 19 screen The patient denies symptoms of COVID 19 at this time.  The importance of social distancing was discussed today.  Follow-up:  After the device implant  Next remote: As Scheduled   Current medicines are reviewed at length with the patient today.   The patient does not have concerns regarding her medicines.  The following changes were made today:  none  Labs/ tests ordered today include:  Per change out  No orders of the defined types were placed in this encounter.      Patient Risk:  after full review of this patients clinical status, I feel that they are at moderate risk at this time.  Today, I have spent 13  minutes with the patient with telehealth technology discussing the above.  Signed, Virl Axe, MD  11/29/2019 2:53 PM     [facility] [address] [place] [address] [phone] (office) [phone] (fax)

## 2019-12-11 ENCOUNTER — Telehealth: Payer: Self-pay

## 2019-12-11 DIAGNOSIS — Z01812 Encounter for preprocedural laboratory examination: Secondary | ICD-10-CM

## 2019-12-11 DIAGNOSIS — I5042 Chronic combined systolic (congestive) and diastolic (congestive) heart failure: Secondary | ICD-10-CM

## 2019-12-11 NOTE — Telephone Encounter (Signed)
Spoke with pt's daughter, DPR and reviewed device instruction letter.  See letter for complete details.  Pt's daughter verbalizes understanding and agrees with current plan.  Copy of letter placed at front desk for pick up as well as surgical scrub and instructions.

## 2019-12-17 ENCOUNTER — Other Ambulatory Visit (HOSPITAL_COMMUNITY)
Admission: RE | Admit: 2019-12-17 | Discharge: 2019-12-17 | Disposition: A | Discharge status: Home / Self Care | Payer: Medicare Other | Source: Ambulatory Visit | Attending: Internal Medicine | Admitting: Internal Medicine

## 2019-12-17 ENCOUNTER — Other Ambulatory Visit: Payer: Medicare Other | Admitting: *Deleted

## 2019-12-17 ENCOUNTER — Other Ambulatory Visit: Payer: Self-pay

## 2019-12-17 DIAGNOSIS — I5042 Chronic combined systolic (congestive) and diastolic (congestive) heart failure: Secondary | ICD-10-CM | POA: Diagnosis not present

## 2019-12-17 DIAGNOSIS — Z01812 Encounter for preprocedural laboratory examination: Secondary | ICD-10-CM | POA: Diagnosis not present

## 2019-12-17 DIAGNOSIS — Z20822 Contact with and (suspected) exposure to covid-19: Secondary | ICD-10-CM | POA: Diagnosis not present

## 2019-12-17 LAB — SARS CORONAVIRUS 2 (TAT 6-24 HRS): SARS Coronavirus 2: NEGATIVE

## 2019-12-17 LAB — CBC
Hematocrit: 40.1 % (ref 34.0–46.6)
Hemoglobin: 13.7 g/dL (ref 11.1–15.9)
MCH: 32.2 pg (ref 26.6–33.0)
MCHC: 34.2 g/dL (ref 31.5–35.7)
MCV: 94 fL (ref 79–97)
Platelets: 231 10*3/uL (ref 150–450)
RBC: 4.25 x10E6/uL (ref 3.77–5.28)
RDW: 13.4 % (ref 11.7–15.4)
WBC: 7.8 10*3/uL (ref 3.4–10.8)

## 2019-12-17 LAB — BASIC METABOLIC PANEL
BUN/Creatinine Ratio: 35 — ABNORMAL HIGH (ref 12–28)
BUN: 34 mg/dL (ref 10–36)
CO2: 37 mmol/L — ABNORMAL HIGH (ref 20–29)
Calcium: 9.8 mg/dL (ref 8.7–10.3)
Chloride: 96 mmol/L (ref 96–106)
Creatinine, Ser: 0.97 mg/dL (ref 0.57–1.00)
GFR calc Af Amer: 57 mL/min/{1.73_m2} — ABNORMAL LOW (ref >59)
GFR calc non Af Amer: 50 mL/min/{1.73_m2} — ABNORMAL LOW (ref >59)
Glucose: 84 mg/dL (ref 65–99)
Potassium: 4.1 mmol/L (ref 3.5–5.2)
Sodium: 140 mmol/L (ref 134–144)

## 2019-12-17 NOTE — Addendum Note (Signed)
Addended by: Thora Lance on: 12/17/2019 12:11 PM   Modules accepted: Orders

## 2019-12-18 ENCOUNTER — Telehealth: Payer: Self-pay | Admitting: Internal Medicine

## 2019-12-18 ENCOUNTER — Encounter (HOSPITAL_COMMUNITY): Payer: Self-pay | Admitting: Internal Medicine

## 2019-12-18 NOTE — Telephone Encounter (Signed)
Spoke with pt's daughter and advised if pt needs to use nebulizer prior to coming to the hospital in the am for her procedures with Dr Caryl Comes she may do so.  Pt's daughter verbalizes understanding and thanked Therapist, sports for call.

## 2019-12-18 NOTE — Anesthesia Preprocedure Evaluation (Addendum)
Anesthesia Evaluation  Patient identified by MRN, date of birth, ID band Patient awake    Reviewed: Allergy & Precautions, NPO status , Patient's Chart, lab work & pertinent test results  Airway Mallampati: II  TM Distance: >3 FB Neck ROM: Full    Dental  (+) Missing   Pulmonary asthma , COPD, former smoker,    Pulmonary exam normal breath sounds clear to auscultation       Cardiovascular hypertension, Pt. on medications + CAD and +CHF  + dysrhythmias (complete heart block) + pacemaker  Rhythm:Regular Rate:Normal  paced   Neuro/Psych TIAnegative psych ROS   GI/Hepatic Neg liver ROS, GERD  ,  Endo/Other  negative endocrine ROS  Renal/GU negative Renal ROS  negative genitourinary   Musculoskeletal  (+) Arthritis ,   Abdominal   Peds negative pediatric ROS (+)  Hematology negative hematology ROS (+)   Anesthesia Other Findings   Reproductive/Obstetrics negative OB ROS                            Anesthesia Physical Anesthesia Plan  ASA: IV  Anesthesia Plan: General   Post-op Pain Management:    Induction:   PONV Risk Score and Plan: 3 and Ondansetron and Treatment may vary due to age or medical condition  Airway Management Planned: Natural Airway and Simple Face Mask  Additional Equipment:   Intra-op Plan:   Post-operative Plan:   Informed Consent: I have reviewed the patients History and Physical, chart, labs and discussed the procedure including the risks, benefits and alternatives for the proposed anesthesia with the patient or authorized representative who has indicated his/her understanding and acceptance.   Patient has DNR.  Discussed DNR with patient and Suspend DNR.     Plan Discussed with: CRNA, Anesthesiologist and Surgeon  Anesthesia Plan Comments: (Patient would like DNR suspended perioperatively. )     Anesthesia Quick Evaluation

## 2019-12-18 NOTE — Telephone Encounter (Signed)
New Message:    Pt is having a procedure tomorrow morning. Her daughter wants to know if she can use her Levalbuterol before her procedure?

## 2019-12-18 NOTE — Progress Notes (Signed)
Attempted to call patient regarding instructions for tomorrows procedure.leave voice mail

## 2019-12-19 ENCOUNTER — Other Ambulatory Visit: Payer: Self-pay | Admitting: *Deleted

## 2019-12-19 ENCOUNTER — Encounter (HOSPITAL_COMMUNITY): Payer: Self-pay | Admitting: Internal Medicine

## 2019-12-19 ENCOUNTER — Other Ambulatory Visit: Payer: Self-pay

## 2019-12-19 ENCOUNTER — Ambulatory Visit (HOSPITAL_COMMUNITY): Payer: Medicare Other | Admitting: Anesthesiology

## 2019-12-19 ENCOUNTER — Ambulatory Visit (HOSPITAL_COMMUNITY)
Admission: RE | Admit: 2019-12-19 | Discharge: 2019-12-19 | Disposition: A | Discharge status: Home / Self Care | Payer: Medicare Other | Attending: Internal Medicine | Admitting: Internal Medicine

## 2019-12-19 ENCOUNTER — Ambulatory Visit (HOSPITAL_COMMUNITY)
Admission: RE | Disposition: A | Discharge status: Home / Self Care | Payer: Self-pay | Source: Home / Self Care | Attending: Internal Medicine

## 2019-12-19 DIAGNOSIS — I252 Old myocardial infarction: Secondary | ICD-10-CM | POA: Insufficient documentation

## 2019-12-19 DIAGNOSIS — I4819 Other persistent atrial fibrillation: Secondary | ICD-10-CM | POA: Insufficient documentation

## 2019-12-19 DIAGNOSIS — E785 Hyperlipidemia, unspecified: Secondary | ICD-10-CM | POA: Diagnosis not present

## 2019-12-19 DIAGNOSIS — I442 Atrioventricular block, complete: Secondary | ICD-10-CM | POA: Diagnosis not present

## 2019-12-19 DIAGNOSIS — K219 Gastro-esophageal reflux disease without esophagitis: Secondary | ICD-10-CM | POA: Insufficient documentation

## 2019-12-19 DIAGNOSIS — I951 Orthostatic hypotension: Secondary | ICD-10-CM | POA: Diagnosis not present

## 2019-12-19 DIAGNOSIS — Z87891 Personal history of nicotine dependence: Secondary | ICD-10-CM | POA: Diagnosis not present

## 2019-12-19 DIAGNOSIS — Z7901 Long term (current) use of anticoagulants: Secondary | ICD-10-CM | POA: Diagnosis not present

## 2019-12-19 DIAGNOSIS — I5042 Chronic combined systolic (congestive) and diastolic (congestive) heart failure: Secondary | ICD-10-CM | POA: Diagnosis not present

## 2019-12-19 DIAGNOSIS — J449 Chronic obstructive pulmonary disease, unspecified: Secondary | ICD-10-CM | POA: Insufficient documentation

## 2019-12-19 DIAGNOSIS — I4891 Unspecified atrial fibrillation: Secondary | ICD-10-CM | POA: Diagnosis not present

## 2019-12-19 DIAGNOSIS — I255 Ischemic cardiomyopathy: Secondary | ICD-10-CM | POA: Insufficient documentation

## 2019-12-19 DIAGNOSIS — Z8673 Personal history of transient ischemic attack (TIA), and cerebral infarction without residual deficits: Secondary | ICD-10-CM | POA: Insufficient documentation

## 2019-12-19 DIAGNOSIS — I6523 Occlusion and stenosis of bilateral carotid arteries: Secondary | ICD-10-CM | POA: Insufficient documentation

## 2019-12-19 DIAGNOSIS — Z9981 Dependence on supplemental oxygen: Secondary | ICD-10-CM | POA: Insufficient documentation

## 2019-12-19 DIAGNOSIS — Z45010 Encounter for checking and testing of cardiac pacemaker pulse generator [battery]: Secondary | ICD-10-CM | POA: Insufficient documentation

## 2019-12-19 DIAGNOSIS — I110 Hypertensive heart disease with heart failure: Secondary | ICD-10-CM | POA: Diagnosis not present

## 2019-12-19 DIAGNOSIS — Z79899 Other long term (current) drug therapy: Secondary | ICD-10-CM | POA: Diagnosis not present

## 2019-12-19 DIAGNOSIS — I2510 Atherosclerotic heart disease of native coronary artery without angina pectoris: Secondary | ICD-10-CM | POA: Insufficient documentation

## 2019-12-19 DIAGNOSIS — I472 Ventricular tachycardia: Secondary | ICD-10-CM | POA: Diagnosis not present

## 2019-12-19 HISTORY — PX: CARDIOVERSION: EP1203

## 2019-12-19 HISTORY — PX: PPM GENERATOR CHANGEOUT: EP1233

## 2019-12-19 SURGERY — CARDIOVERSION (CATH LAB)
Anesthesia: General

## 2019-12-19 MED ORDER — ONDANSETRON HCL 4 MG/2ML IJ SOLN: 4.0000 mg | Freq: Four times a day (QID) | INTRAMUSCULAR | Status: DC | PRN

## 2019-12-19 MED ORDER — SODIUM CHLORIDE 0.9 % IV SOLN: INTRAVENOUS | Status: DC

## 2019-12-19 MED ORDER — CEFAZOLIN SODIUM-DEXTROSE 2-4 GM/100ML-% IV SOLN
2.0000 g | INTRAVENOUS | Status: AC
  Administered 2019-12-19: 2 g via INTRAVENOUS

## 2019-12-19 MED ORDER — LIDOCAINE HCL 1 % IJ SOLN
INTRAMUSCULAR | Status: AC
  Filled 2019-12-19: qty 20

## 2019-12-19 MED ORDER — LIDOCAINE HCL (PF) 1 % IJ SOLN
INTRAMUSCULAR | Status: DC | PRN
  Administered 2019-12-19: 45 mL

## 2019-12-19 MED ORDER — ACETAMINOPHEN 325 MG PO TABS
325.0000 mg | ORAL_TABLET | ORAL | Status: DC | PRN
  Filled 2019-12-19: qty 2

## 2019-12-19 MED ORDER — SODIUM CHLORIDE 0.9 % IV SOLN
80.0000 mg | INTRAVENOUS | Status: AC
  Administered 2019-12-19: 80 mg
  Filled 2019-12-19: qty 2

## 2019-12-19 MED ORDER — PROPOFOL 10 MG/ML IV BOLUS
INTRAVENOUS | Status: DC | PRN
  Administered 2019-12-19: 40 mg via INTRAVENOUS

## 2019-12-19 MED ORDER — LIDOCAINE 2% (20 MG/ML) 5 ML SYRINGE
INTRAMUSCULAR | Status: DC | PRN
  Administered 2019-12-19: 40 mg via INTRAVENOUS

## 2019-12-19 MED ORDER — CEFAZOLIN SODIUM-DEXTROSE 2-4 GM/100ML-% IV SOLN
INTRAVENOUS | Status: AC
  Filled 2019-12-19: qty 100

## 2019-12-19 MED ORDER — SODIUM CHLORIDE 0.9 % IV SOLN
INTRAVENOUS | Status: AC
  Filled 2019-12-19: qty 2

## 2019-12-19 SURGICAL SUPPLY — 5 items
CABLE SURGICAL S-101-97-12 (CABLE) ×3 IMPLANT
IPG PACE AZUR XT DR MRI W1DR01 (Pacemaker) IMPLANT
PACE AZURE XT DR MRI W1DR01 (Pacemaker) ×3 IMPLANT
PAD PRO RADIOLUCENT 2001M-C (PAD) ×3 IMPLANT
TRAY PACEMAKER INSERTION (PACKS) ×3 IMPLANT

## 2019-12-19 NOTE — Progress Notes (Signed)
Instructions reviewed with pt and pt's daughter Wells Guiles. Verbalized understanding.

## 2019-12-19 NOTE — Anesthesia Procedure Notes (Signed)
Procedure Name: MAC Date/Time: 12/19/2019 8:30 AM Performed by: Candis Shine, CRNA Pre-anesthesia Checklist: Patient identified, Emergency Drugs available, Suction available, Patient being monitored and Timeout performed Patient Re-evaluated:Patient Re-evaluated prior to induction Oxygen Delivery Method: Nasal cannula Dental Injury: Teeth and Oropharynx as per pre-operative assessment

## 2019-12-19 NOTE — Discharge Instructions (Signed)
Implantable Cardiac Device Battery Change, Care After  This sheet gives you information about how to care for yourself after your procedure. Your health care provider may also give you more specific instructions. If you have problems or questions, contact your health care provider. What can I expect after the procedure? After your procedure, it is common to have:  Pain or soreness at the site where the cardiac device was inserted.  Swelling at the site where the cardiac device was inserted.  You should received an information card for your new device in 4-8 weeks. Follow these instructions at home: Incision care   Keep the incision clean and dry. ? Do not take baths, swim, or use a hot tub until after your wound check.  ? Do not shower for at least 7 days, or as directed by your health care provider. You may shower tomorrow evening (10/7) ? Pat the area dry with a clean towel. Do not rub the area. This may cause bleeding.  Follow instructions from your health care provider about how to take care of your incision. Make sure you: ? Leave stitches (sutures), skin glue, or adhesive strips in place. These skin closures may need to stay in place for 2 weeks or longer. If adhesive strip edges start to loosen and curl up, you may trim the loose edges. Do not remove adhesive strips completely unless your health care provider tells you to do that.  Check your incision area every day for signs of infection. Check for: ? More redness, swelling, or pain. ? More fluid or blood. ? Warmth. ? Pus or a bad smell. Activity  Do not lift anything that is heavier than 10 lb (4.5 kg) until your health care provider says it is okay to do so. 1 week  For the first week, or as long as told by your health care provider: ? Avoid lifting your affected arm higher than your shoulder. ? After 1 week, Be gentle when you move your arms over your head. It is okay to raise your arm to comb your hair. ? Avoid strenuous  exercise.  Ask your health care provider when it is okay to: ? Resume your normal activities. ? Return to work or school. ? Resume sexual activity. Eating and drinking  Eat a heart-healthy diet. This should include plenty of fresh fruits and vegetables, whole grains, low-fat dairy products, and lean protein like chicken and fish.  Limit alcohol intake to no more than 1 drink a day for non-pregnant women and 2 drinks a day for men. One drink equals 12 oz of beer, 5 oz of wine, or 1 oz of hard liquor.  Check ingredients and nutrition facts on packaged foods and beverages. Avoid the following types of food: ? Food that is high in salt (sodium). ? Food that is high in saturated fat, like full-fat dairy or red meat. ? Food that is high in trans fat, like fried food. ? Food and drinks that are high in sugar. Lifestyle  Do not use any products that contain nicotine or tobacco, such as cigarettes and e-cigarettes. If you need help quitting, ask your health care provider.  Take steps to manage and control your weight.  Once cleared, get regular exercise. Aim for 150 minutes of moderate-intensity exercise (such as walking or yoga) or 75 minutes of vigorous exercise (such as running or swimming) each week.  Manage other health problems, such as diabetes or high blood pressure. Ask your health care provider how you  can manage these conditions. General instructions  Do not drive for 24 hours after your procedure if you were given a medicine to help you relax (sedative).  Take over-the-counter and prescription medicines only as told by your health care provider.  Avoid putting pressure on the area where the cardiac device was placed.  If you need an MRI after your cardiac device has been placed, be sure to tell the health care provider who orders the MRI that you have a cardiac device.  Avoid close and prolonged exposure to electrical devices that have strong magnetic fields. These  include: ? Cell phones. Avoid keeping them in a pocket near the cardiac device, and try using the ear opposite the cardiac device. ? MP3 players. ? Household appliances, like microwaves. ? Metal detectors. ? Electric generators. ? High-tension wires.  Keep all follow-up visits as directed by your health care provider. This is important. Contact a health care provider if:  You have pain at the incision site that is not relieved by over-the-counter or prescription medicines.  You have any of these around your incision site or coming from it: ? More redness, swelling, or pain. ? Fluid or blood. ? Warmth to the touch. ? Pus or a bad smell.  You have a fever.  You feel brief, occasional palpitations, light-headedness, or any symptoms that you think might be related to your heart. Get help right away if:  You experience chest pain that is different from the pain at the cardiac device site.  You develop a red streak that extends above or below the incision site.  You experience shortness of breath.  You have palpitations or an irregular heartbeat.  You have light-headedness that does not go away quickly.  You faint or have dizzy spells.  Your pulse suddenly drops or increases rapidly and does not return to normal.  You begin to gain weight and your legs and ankles swell. Summary  After your procedure, it is common to have pain, soreness, and some swelling where the cardiac device was inserted.  Make sure to keep your incision clean and dry. Follow instructions from your health care provider about how to take care of your incision.  Check your incision every day for signs of infection, such as more pain or swelling, pus or a bad smell, warmth, or leaking fluid and blood.  Avoid strenuous exercise and lifting your left arm higher than your shoulder for 2 weeks, or as long as told by your health care provider. This information is not intended to replace advice given to you by  your health care provider. Make sure you discuss any questions you have with your health care provider.

## 2019-12-19 NOTE — Transfer of Care (Addendum)
Immediate Anesthesia Transfer of Care Note  Patient: Jamie [name]  Procedure(s) Performed: CARDIOVERSION (CATH LAB) - For Afib (N/A ) PPM GENERATOR CHANGEOUT (N/A )  Patient Location: EP6. Pt to remain in EP for the rest of the procedure post cardioversion without anesthesia. Report given to EP RN. VSS.  Anesthesia Type:MAC  Level of Consciousness: awake, alert  and oriented  Airway & Oxygen Therapy: Patient Spontanous Breathing and Patient connected to nasal cannula oxygen  Post-op Assessment: Report given to RN and Post -op Vital signs reviewed and stable  Post vital signs: Reviewed and stable  Last Vitals:  Vitals Value Taken Time  BP    Temp    Pulse    Resp    SpO2      Last Pain:  Vitals:   12/19/19 0703  TempSrc:   PainSc: 0-No pain      Patients Stated Pain Goal: 3 (91/63/84 6659)  Complications: No complications documented.

## 2019-12-19 NOTE — Telephone Encounter (Signed)
Error

## 2019-12-19 NOTE — Anesthesia Postprocedure Evaluation (Signed)
Anesthesia Post Note  Patient: Jamie [name]  Procedure(s) Performed: CARDIOVERSION (CATH LAB) - For Afib (N/A ) PPM GENERATOR CHANGEOUT (N/A )     Patient location during evaluation: PACU Anesthesia Type: General Level of consciousness: sedated Pain management: pain level controlled Vital Signs Assessment: post-procedure vital signs reviewed and stable Respiratory status: spontaneous breathing and respiratory function stable Cardiovascular status: stable Postop Assessment: no apparent nausea or vomiting Anesthetic complications: no   No complications documented.  Last Vitals:  Vitals:   12/19/19 0956 12/19/19 1007  BP: 127/68 (!) 140/52  Pulse: 75 74  Resp: 18   Temp:  36.7 C  SpO2: 97% 99%    Last Pain:  Vitals:   12/19/19 1007  TempSrc: Temporal  PainSc: 0-No pain                 Merlinda Frederick

## 2019-12-20 MED FILL — Lidocaine HCl Local Inj 1%: INTRAMUSCULAR | Qty: 60 | Status: AC

## 2020-01-01 ENCOUNTER — Other Ambulatory Visit: Payer: Self-pay

## 2020-01-01 ENCOUNTER — Ambulatory Visit (INDEPENDENT_AMBULATORY_CARE_PROVIDER_SITE_OTHER): Payer: Medicare Other | Admitting: Emergency Medicine

## 2020-01-01 DIAGNOSIS — I442 Atrioventricular block, complete: Secondary | ICD-10-CM

## 2020-01-01 LAB — CUP PACEART INCLINIC DEVICE CHECK
Battery Remaining Longevity: 113 mo
Battery Voltage: 3.2 V
Brady Statistic AP VP Percent: 59.22 %
Brady Statistic AP VS Percent: 0.01 %
Brady Statistic AS VP Percent: 39.2 %
Brady Statistic AS VS Percent: 1.57 %
Brady Statistic RA Percent Paced: 59.88 %
Brady Statistic RV Percent Paced: 98.42 %
Date Time Interrogation Session: 20211019150753
Implantable Lead Implant Date: 20020116
Implantable Lead Implant Date: 20020116
Implantable Lead Location: 753859
Implantable Lead Location: 753860
Implantable Lead Model: 5076
Implantable Pulse Generator Implant Date: 20211006
Lead Channel Impedance Value: 342 Ohm
Lead Channel Impedance Value: 380 Ohm
Lead Channel Impedance Value: 418 Ohm
Lead Channel Impedance Value: 589 Ohm
Lead Channel Pacing Threshold Amplitude: 1 V
Lead Channel Pacing Threshold Amplitude: 1 V
Lead Channel Pacing Threshold Pulse Width: 0.4 ms
Lead Channel Pacing Threshold Pulse Width: 0.4 ms
Lead Channel Sensing Intrinsic Amplitude: 0.375 mV
Lead Channel Setting Pacing Amplitude: 2.25 V
Lead Channel Setting Pacing Amplitude: 3.5 V
Lead Channel Setting Pacing Pulse Width: 0.4 ms
Lead Channel Setting Sensing Sensitivity: 0.9 mV

## 2020-01-01 NOTE — Progress Notes (Signed)
Wound check appointment. Steri-strips removed. Wound without redness or edema. Incision edges approximated, wound well healed. Normal device function. Thresholds, sensing, and impedances consistent with implant measurements. Device programmed at chronic settings due to mature leads. Histogram distribution appropriate for patient and level of activity. No mode switches or high ventricular rates noted. Patient educated about wound care. ROV with Dr Caryl Comes 04/01/19. Enrolled in remote follow-up, next remote 02/03/20.

## 2020-01-15 NOTE — Progress Notes (Deleted)
Cardiology Office Note Date:  01/15/2020  Patient ID:  Jamie, [name] 1922/08/09, MRN [id number] PCP:  Crist Infante, MD  Cardiologist:  Dr. Caryl Comes   Chief Complaint: hospital f/u  History of Present Illness: Jamie [name] is a 86 y.o. female with history of CHB w/PPM, O2 dependent COPD, CAD, chronic combined CHF, HTN, ICM, AFib  She comes in today to be seen for Dr. Caryl Comes, last seen by him via tele health visit Sept 2021, noted device at [facility], discussed gen change and perhaps DCCV.  The pt was not in favor of DCCV (her daughter was), Dr. Caryl Comes did not feel strongly about DCCV with no clear symptoms 2/2 AF.  Underwent DCCV and gen change 12/19/19. Wound check visit 01/01/20, reports well healed, planned for Jan 2022 visit with Dr. Caryl Comes   *** symptoms *** eliquis, labs, dose, bleeding *** rhythm? *** site *** meds, CAD *** VDD *** volume   DEVICE information:  MDT dual chamber PPM, programmed VDD, gen change Oct 2010, 2010, initially implanted 2002, Dr. Caryl Comes Jan 2018, sensitivity was adjusted and sensing assurance programmed off with a report of syncopal event to reduce possibility of oversensing (under pacing) dependent   Past Medical History:  Diagnosis Date  . Asthma   . Carotid stenosis    a. Carotid US (9/15):  Bilateral ICA 1-39%  . Chronic combined systolic and diastolic CHF (congestive heart failure) ([place])    a. EF 40-45% in 11/2013, improved to 55-60% in 05/2016.  Marland Kitchen Complete AV block ([place])   . COPD (chronic obstructive pulmonary disease) ([place])   . Family history of anesthesia complication    SISTER HAS DIFFICULTY WAKING  . GERD (gastroesophageal reflux disease)   . Hypertension   . Ischemic cardiomyopathy    a.  Echo (9/15):  EF 40-45%, ant and apical and inferoapical HK, Gr 1 DD, trivial MR, mild TR, PASP 23 mmHg;  b. Myoview (9/15) large scar in mid LAD territory, no peri-infarct ischemia, EF 60%, apical AK; intermediate risk >> patient opted for med Rx. b. EF  55-60% by echo 05/2016.  . Orthostasis   . Osteoarthritis   . Osteopenia   . Pacemaker   . Pneumonia   . Pre-syncope   . Presence of permanent cardiac pacemaker   . Shortness of breath   . Tachycardia   . TIA (transient ischemic attack)    a. possible TIA 11/2013.  Marland Kitchen UTI (urinary tract infection)     Past Surgical History:  Procedure Laterality Date  . CARDIOVERSION N/A 12/19/2019   Procedure: CARDIOVERSION (CATH LAB) - For Afib;  Surgeon: Deboraha Sprang, MD;  Location: [place] [facility];  Service: Cardiovascular;  Laterality: N/A;  . CHOLECYSTECTOMY    . EYE SURGERY  10/2004  . HERNIA REPAIR  11/2002  . PACEMAKER PLACEMENT     Medtronic  . PPM GENERATOR CHANGEOUT N/A 12/19/2019   Procedure: PPM GENERATOR CHANGEOUT;  Surgeon: Deboraha Sprang, MD;  Location: [place] [facility];  Service: Cardiovascular;  Laterality: N/A;    Current Outpatient Medications  Medication Sig Dispense Refill  . albuterol (PROAIR HFA) 108 (90 BASE) MCG/ACT inhaler Inhale 2 puffs into the lungs every 6 (six) hours as needed for wheezing or shortness of breath.     . ALPRAZolam (XANAX) 0.5 MG tablet Take 0.5 mg by mouth at bedtime as needed for sleep.     Marland Kitchen apixaban (ELIQUIS) 2.5 MG TABS tablet Take 1 tablet (2.5 mg total) by  mouth 2 (two) times daily. 180 tablet 3  . bismuth subsalicylate (PEPTO BISMOL) 262 MG chewable tablet Chew 524 mg by mouth daily as needed for diarrhea or loose stools.    . Calcium Carbonate (CALTRATE 600) 1500 MG TABS Take 630 mg by mouth daily.     . cyanocobalamin 1000 MCG tablet Take 1,000 mcg by mouth daily.     . furosemide (LASIX) 40 MG tablet Take 40 mg by mouth daily.     Marland Kitchen levalbuterol (XOPENEX) 0.63 MG/3ML nebulizer solution Take 0.63 mg by nebulization daily.     Marland Kitchen loperamide (IMODIUM) 2 MG capsule Take 2 mg by mouth daily as needed for diarrhea or loose stools.    Marland Kitchen losartan (COZAAR) 25 MG tablet Take 1 tablet (25 mg total) by mouth daily. Please make yearly appt with Dr.  Caryl Comes for December before anymore refills. 1st attempt (Patient taking differently: Take 25 mg by mouth daily. ) 90 tablet 0  . melatonin 3 MG TABS tablet Take 3 mg by mouth at bedtime as needed (sleep).    . nitroGLYCERIN (NITROSTAT) 0.4 MG SL tablet Place 1 tablet (0.4 mg total) under the tongue every 5 (five) minutes as needed for chest pain. 15 tablet 12  . pantoprazole (PROTONIX) 40 MG tablet Take 40 mg by mouth daily.  3  . potassium chloride (K-DUR) 10 MEQ tablet Take 10 mEq by mouth 2 (two) times a week. Tuesday and Friday    . Probiotic Product ([place]) CAPS Take 1 capsule by mouth daily.     No current facility-administered medications for this visit.    Allergies:   Ace inhibitors and Hydrocodone-acetaminophen   Social History:  The patient  reports that she quit smoking about 32 years ago. Her smoking use included cigarettes. She has a 35.00 pack-year smoking history. She has never used smokeless tobacco. She reports that she does not drink alcohol and does not use drugs.   Family History:  The patient's family history includes Breast cancer in her sister; Heart disease in her sister.  ROS:  Please see the history of present illness.  All other systems are reviewed and otherwise negative.   PHYSICAL EXAM:  VS:  There were no vitals taken for this visit. BMI: There is no height or weight on file to calculate BMI. Well nourished, thin, though well developed, in no acute distress  HEENT: normocephalic, atraumatic  Neck: no JVD, carotid bruits or masses Cardiac:  *** RRR; no significant murmurs, no rubs, or gallops Lungs:  *** CTA b/l, no wheezing, rhonchi or rales  Abd: soft, nontender MS: no deformity or *** atrophy Ext: *** no edema  Skin: warm and dry, no rash Neuro:  No gross deficits appreciated Psych: euthymic mood, full affect  *** PPM site is stable, no tethering or discomfort   EKG:  Not done today  Device interrogation done today and reviewed by  myself: ***   05/20/16: TTE Study Conclusions - Left ventricle: The cavity size was normal. Wall thickness was   normal. Systolic function was normal. The estimated ejection   fraction was in the range of 55% to 60%. Wall motion was normal;   there were no regional wall motion abnormalities. Doppler   parameters are consistent with abnormal left ventricular   relaxation (grade 1 diastolic dysfunction). - Aortic valve: There was trivial regurgitation. - Mitral valve: Calcified annulus. Mildly thickened leaflets . - Atrial septum: There was an atrial septal aneurysm. Impressions: - Normal LV  systolic function; grade 1 diastolic dysfunction;   calcified aortic valve with trace AI; mild TR.  Recent Labs: 12/17/2019: BUN 34; Creatinine, Ser 0.97; Hemoglobin 13.7; Platelets 231; Potassium 4.1; Sodium 140  No results found for requested labs within last 8760 hours.   CrCl cannot be calculated (Patient's most recent lab result is older than the maximum 21 days allowed.).   Wt Readings from Last 3 Encounters:  12/19/19 95 lb (43.1 kg)  11/29/19 95 lb (43.1 kg)  08/09/19 96 lb (43.5 kg)     Other studies reviewed: Additional studies/records reviewed today include: summarized above  ASSESSMENT AND PLAN:  1. PPM     ***  2. HTN     *** stable no changes  3. Chronic combined CHF     ***  4. Persistent afib     CHA2DS2Vasc is 5, on Eliquis, *** appropriately dosed     s/p DCCV Oct 2021     ***  5. CAD, h/o MI     Notes report statin intolerant     ***  Disposition: ***.  Current medicines are reviewed at length with the patient today.  The patient did not have any concerns regarding medicines.  Haywood Lasso, PA-C 01/15/2020 8:41 PM     [address] [place] [phone] (office)  [phone] (fax)

## 2020-01-16 ENCOUNTER — Encounter: Payer: Medicare Other | Admitting: Physician Assistant

## 2020-01-31 DIAGNOSIS — M160 Bilateral primary osteoarthritis of hip: Secondary | ICD-10-CM | POA: Diagnosis not present

## 2020-01-31 DIAGNOSIS — K219 Gastro-esophageal reflux disease without esophagitis: Secondary | ICD-10-CM | POA: Diagnosis not present

## 2020-01-31 DIAGNOSIS — I10 Essential (primary) hypertension: Secondary | ICD-10-CM | POA: Diagnosis not present

## 2020-01-31 DIAGNOSIS — M810 Age-related osteoporosis without current pathological fracture: Secondary | ICD-10-CM | POA: Diagnosis not present

## 2020-01-31 DIAGNOSIS — E785 Hyperlipidemia, unspecified: Secondary | ICD-10-CM | POA: Diagnosis not present

## 2020-01-31 DIAGNOSIS — J449 Chronic obstructive pulmonary disease, unspecified: Secondary | ICD-10-CM | POA: Diagnosis not present

## 2020-01-31 DIAGNOSIS — I2510 Atherosclerotic heart disease of native coronary artery without angina pectoris: Secondary | ICD-10-CM | POA: Diagnosis not present

## 2020-01-31 DIAGNOSIS — R7301 Impaired fasting glucose: Secondary | ICD-10-CM | POA: Diagnosis not present

## 2020-01-31 DIAGNOSIS — Z950 Presence of cardiac pacemaker: Secondary | ICD-10-CM | POA: Diagnosis not present

## 2020-01-31 DIAGNOSIS — I4891 Unspecified atrial fibrillation: Secondary | ICD-10-CM | POA: Diagnosis not present

## 2020-01-31 DIAGNOSIS — R0602 Shortness of breath: Secondary | ICD-10-CM | POA: Diagnosis not present

## 2020-02-14 DIAGNOSIS — Z23 Encounter for immunization: Secondary | ICD-10-CM | POA: Diagnosis not present

## 2020-03-05 ENCOUNTER — Telehealth: Payer: Self-pay

## 2020-03-05 NOTE — Telephone Encounter (Signed)
Manual transmission received- appears to still be in AF.    Apoke with pt. Daughter Maudie Mercury.  She reports compliance with Eliquis 2.5mg  as ordered.  She reports pt has seemed "off" since yesterday, she was wondering if she might have a UTI.  Pt seems SOB at times but there does not appear to be increase over baseline.

## 2020-03-05 NOTE — Telephone Encounter (Signed)
Carelink alert received for AT/AF > 6 hours. AT/AF burden 0.7%. EGM suggest AF w/ controlled VR's. + Eliquis. Called to assess/check medication compliance. Need manual transmission to see if patient is still in AF.  Joelene Millin ([place]) [phone] (patient lives with Maudie Mercury)

## 2020-03-05 NOTE — Telephone Encounter (Signed)
[name] calling to return the device clinics phone call, connected call to the device clinic.

## 2020-03-11 NOTE — Telephone Encounter (Signed)
Jamie [name]  merry christmas  We should check to see if she is still in afib; there was a back and forth in October about whether to undertake DCCV then and the big question would be as to wehtehr her daughter appreciated a change for the good in her mom after the DCCV in October that would justify doing it again Thanks SK

## 2020-03-12 NOTE — Telephone Encounter (Signed)
Spoke with pt daughter Selena Batten, she sent follow-up transmission.  Pt still in AF.    Current symptoms include slight increase in SOB, and recentlly noting increased swelling in pt feet.  Swelling does not affect pt mobility currently.  Pt is taking increased furosemide as previosuly directed.    D/w pt condition after last DCCV and if the procedure had an impact.  She feels that it did.  She reports following the DCCV in October pt did not have welling in feet and her SOB was improved.    Pt daughter would like to explore option if repeating DCCV if that is possible.

## 2020-03-13 NOTE — Telephone Encounter (Signed)
Spoke with pt's daughter, Cala Bradford, [place]  to discuss Afib clinic as an option for pt.   Pt's daughter agrees with moving forward as Dr Graciela Husbands is currently out of the office.  Appointment scheduled with Afib clinic 03/19/2020 at 10am for further evaluation and possible scheduling of DCCV.  Provided parking code and instruction on where the clinic is.  Pt's daughter verbalizes understanding and agrees with this plan.

## 2020-03-18 ENCOUNTER — Ambulatory Visit (HOSPITAL_COMMUNITY): Payer: Medicare Other | Admitting: Physician Assistant

## 2020-03-19 ENCOUNTER — Ambulatory Visit (INDEPENDENT_AMBULATORY_CARE_PROVIDER_SITE_OTHER): Payer: Medicare Other

## 2020-03-19 ENCOUNTER — Ambulatory Visit (HOSPITAL_COMMUNITY): Payer: Medicare Other | Admitting: Physician Assistant

## 2020-03-19 DIAGNOSIS — I255 Ischemic cardiomyopathy: Secondary | ICD-10-CM

## 2020-03-19 LAB — CUP PACEART REMOTE DEVICE CHECK
Battery Remaining Longevity: 114 mo
Battery Voltage: 3.17 V
Brady Statistic RA Percent Paced: 1.42 %
Brady Statistic RV Percent Paced: 99.24 %
Date Time Interrogation Session: 20220104211947
Implantable Lead Implant Date: 20020116
Implantable Lead Implant Date: 20020116
Implantable Lead Location: 753859
Implantable Lead Location: 753860
Implantable Lead Model: 5076
Implantable Pulse Generator Implant Date: 20211006
Lead Channel Impedance Value: 285 Ohm
Lead Channel Impedance Value: 361 Ohm
Lead Channel Impedance Value: 361 Ohm
Lead Channel Impedance Value: 570 Ohm
Lead Channel Pacing Threshold Amplitude: 1.125 V
Lead Channel Pacing Threshold Amplitude: 1.5 V
Lead Channel Pacing Threshold Pulse Width: 0.4 ms
Lead Channel Pacing Threshold Pulse Width: 0.4 ms
Lead Channel Sensing Intrinsic Amplitude: 0.5 mV
Lead Channel Sensing Intrinsic Amplitude: 0.5 mV
Lead Channel Setting Pacing Amplitude: 2.25 V
Lead Channel Setting Pacing Amplitude: 3 V
Lead Channel Setting Pacing Pulse Width: 0.4 ms
Lead Channel Setting Sensing Sensitivity: 0.9 mV

## 2020-03-20 DIAGNOSIS — R300 Dysuria: Secondary | ICD-10-CM | POA: Diagnosis not present

## 2020-03-25 ENCOUNTER — Ambulatory Visit (HOSPITAL_COMMUNITY): Payer: Medicare Other | Admitting: Physician Assistant

## 2020-03-31 ENCOUNTER — Encounter: Payer: Medicare Other | Admitting: Internal Medicine

## 2020-04-02 NOTE — Progress Notes (Signed)
Remote pacemaker transmission.   

## 2020-05-04 ENCOUNTER — Emergency Department (HOSPITAL_COMMUNITY)
Admission: EM | Admit: 2020-05-04 | Discharge: 2020-05-05 | Disposition: A | Discharge status: Home / Self Care | Payer: Medicare Other | Attending: Emergency Medicine | Admitting: Emergency Medicine

## 2020-05-04 ENCOUNTER — Emergency Department (HOSPITAL_COMMUNITY): Payer: Medicare Other

## 2020-05-04 ENCOUNTER — Encounter (HOSPITAL_COMMUNITY): Payer: Self-pay

## 2020-05-04 ENCOUNTER — Other Ambulatory Visit: Payer: Self-pay

## 2020-05-04 DIAGNOSIS — I6782 Cerebral ischemia: Secondary | ICD-10-CM | POA: Diagnosis not present

## 2020-05-04 DIAGNOSIS — R9082 White matter disease, unspecified: Secondary | ICD-10-CM | POA: Diagnosis not present

## 2020-05-04 DIAGNOSIS — Y9301 Activity, walking, marching and hiking: Secondary | ICD-10-CM | POA: Insufficient documentation

## 2020-05-04 DIAGNOSIS — Z950 Presence of cardiac pacemaker: Secondary | ICD-10-CM | POA: Insufficient documentation

## 2020-05-04 DIAGNOSIS — K5730 Diverticulosis of large intestine without perforation or abscess without bleeding: Secondary | ICD-10-CM | POA: Diagnosis not present

## 2020-05-04 DIAGNOSIS — M542 Cervicalgia: Secondary | ICD-10-CM | POA: Diagnosis not present

## 2020-05-04 DIAGNOSIS — I110 Hypertensive heart disease with heart failure: Secondary | ICD-10-CM | POA: Insufficient documentation

## 2020-05-04 DIAGNOSIS — J42 Unspecified chronic bronchitis: Secondary | ICD-10-CM | POA: Diagnosis not present

## 2020-05-04 DIAGNOSIS — J45909 Unspecified asthma, uncomplicated: Secondary | ICD-10-CM | POA: Insufficient documentation

## 2020-05-04 DIAGNOSIS — I700 Atherosclerosis of aorta: Secondary | ICD-10-CM | POA: Diagnosis not present

## 2020-05-04 DIAGNOSIS — J449 Chronic obstructive pulmonary disease, unspecified: Secondary | ICD-10-CM | POA: Insufficient documentation

## 2020-05-04 DIAGNOSIS — S0990XA Unspecified injury of head, initial encounter: Secondary | ICD-10-CM | POA: Diagnosis not present

## 2020-05-04 DIAGNOSIS — W010XXA Fall on same level from slipping, tripping and stumbling without subsequent striking against object, initial encounter: Secondary | ICD-10-CM | POA: Insufficient documentation

## 2020-05-04 DIAGNOSIS — S0993XA Unspecified injury of face, initial encounter: Secondary | ICD-10-CM | POA: Diagnosis present

## 2020-05-04 DIAGNOSIS — S3991XA Unspecified injury of abdomen, initial encounter: Secondary | ICD-10-CM | POA: Diagnosis not present

## 2020-05-04 DIAGNOSIS — Z87891 Personal history of nicotine dependence: Secondary | ICD-10-CM | POA: Diagnosis not present

## 2020-05-04 DIAGNOSIS — S199XXA Unspecified injury of neck, initial encounter: Secondary | ICD-10-CM | POA: Diagnosis not present

## 2020-05-04 DIAGNOSIS — S2241XA Multiple fractures of ribs, right side, initial encounter for closed fracture: Secondary | ICD-10-CM | POA: Diagnosis not present

## 2020-05-04 DIAGNOSIS — I2510 Atherosclerotic heart disease of native coronary artery without angina pectoris: Secondary | ICD-10-CM | POA: Diagnosis not present

## 2020-05-04 DIAGNOSIS — S2231XA Fracture of one rib, right side, initial encounter for closed fracture: Secondary | ICD-10-CM | POA: Diagnosis not present

## 2020-05-04 DIAGNOSIS — K838 Other specified diseases of biliary tract: Secondary | ICD-10-CM | POA: Diagnosis not present

## 2020-05-04 DIAGNOSIS — I6529 Occlusion and stenosis of unspecified carotid artery: Secondary | ICD-10-CM | POA: Diagnosis not present

## 2020-05-04 DIAGNOSIS — R109 Unspecified abdominal pain: Secondary | ICD-10-CM | POA: Insufficient documentation

## 2020-05-04 DIAGNOSIS — J984 Other disorders of lung: Secondary | ICD-10-CM | POA: Diagnosis not present

## 2020-05-04 DIAGNOSIS — I5042 Chronic combined systolic (congestive) and diastolic (congestive) heart failure: Secondary | ICD-10-CM | POA: Insufficient documentation

## 2020-05-04 DIAGNOSIS — I10 Essential (primary) hypertension: Secondary | ICD-10-CM | POA: Diagnosis not present

## 2020-05-04 DIAGNOSIS — J438 Other emphysema: Secondary | ICD-10-CM | POA: Diagnosis not present

## 2020-05-04 DIAGNOSIS — N281 Cyst of kidney, acquired: Secondary | ICD-10-CM | POA: Diagnosis not present

## 2020-05-04 DIAGNOSIS — S299XXA Unspecified injury of thorax, initial encounter: Secondary | ICD-10-CM | POA: Diagnosis present

## 2020-05-04 DIAGNOSIS — G9389 Other specified disorders of brain: Secondary | ICD-10-CM | POA: Diagnosis not present

## 2020-05-04 DIAGNOSIS — W19XXXA Unspecified fall, initial encounter: Secondary | ICD-10-CM | POA: Diagnosis not present

## 2020-05-04 DIAGNOSIS — M549 Dorsalgia, unspecified: Secondary | ICD-10-CM | POA: Insufficient documentation

## 2020-05-04 DIAGNOSIS — R52 Pain, unspecified: Secondary | ICD-10-CM | POA: Diagnosis not present

## 2020-05-04 DIAGNOSIS — R918 Other nonspecific abnormal finding of lung field: Secondary | ICD-10-CM | POA: Diagnosis not present

## 2020-05-04 NOTE — ED Notes (Addendum)
Updated daughter per pt request. Daughter stated that pt has not taken blood thinners since 1/16.

## 2020-05-04 NOTE — ED Triage Notes (Addendum)
Pt arrives via EMS for a fall, pt complains of flank pain on her right side and pain when breathing. Denies hitting head and LOC.  Pt does take Eliquis   Pt on 2L @ home.

## 2020-05-05 ENCOUNTER — Emergency Department (HOSPITAL_COMMUNITY): Payer: Medicare Other

## 2020-05-05 DIAGNOSIS — J438 Other emphysema: Secondary | ICD-10-CM | POA: Diagnosis not present

## 2020-05-05 DIAGNOSIS — G9389 Other specified disorders of brain: Secondary | ICD-10-CM | POA: Diagnosis not present

## 2020-05-05 DIAGNOSIS — N281 Cyst of kidney, acquired: Secondary | ICD-10-CM | POA: Diagnosis not present

## 2020-05-05 DIAGNOSIS — I6529 Occlusion and stenosis of unspecified carotid artery: Secondary | ICD-10-CM | POA: Diagnosis not present

## 2020-05-05 DIAGNOSIS — I2510 Atherosclerotic heart disease of native coronary artery without angina pectoris: Secondary | ICD-10-CM | POA: Diagnosis not present

## 2020-05-05 DIAGNOSIS — S199XXA Unspecified injury of neck, initial encounter: Secondary | ICD-10-CM | POA: Diagnosis not present

## 2020-05-05 DIAGNOSIS — S0990XA Unspecified injury of head, initial encounter: Secondary | ICD-10-CM | POA: Diagnosis not present

## 2020-05-05 DIAGNOSIS — S2241XA Multiple fractures of ribs, right side, initial encounter for closed fracture: Secondary | ICD-10-CM | POA: Diagnosis not present

## 2020-05-05 DIAGNOSIS — K838 Other specified diseases of biliary tract: Secondary | ICD-10-CM | POA: Diagnosis not present

## 2020-05-05 DIAGNOSIS — S3991XA Unspecified injury of abdomen, initial encounter: Secondary | ICD-10-CM | POA: Diagnosis not present

## 2020-05-05 DIAGNOSIS — R9082 White matter disease, unspecified: Secondary | ICD-10-CM | POA: Diagnosis not present

## 2020-05-05 DIAGNOSIS — S2231XA Fracture of one rib, right side, initial encounter for closed fracture: Secondary | ICD-10-CM | POA: Diagnosis not present

## 2020-05-05 DIAGNOSIS — K5730 Diverticulosis of large intestine without perforation or abscess without bleeding: Secondary | ICD-10-CM | POA: Diagnosis not present

## 2020-05-05 DIAGNOSIS — J984 Other disorders of lung: Secondary | ICD-10-CM | POA: Diagnosis not present

## 2020-05-05 LAB — PROTIME-INR
INR: 1.1 (ref 0.8–1.2)
Prothrombin Time: 13.7 seconds (ref 11.4–15.2)

## 2020-05-05 LAB — CBC
HCT: 40.1 % (ref 36.0–46.0)
Hemoglobin: 13.1 g/dL (ref 12.0–15.0)
MCH: 33.5 pg (ref 26.0–34.0)
MCHC: 32.7 g/dL (ref 30.0–36.0)
MCV: 102.6 fL — ABNORMAL HIGH (ref 80.0–100.0)
Platelets: 201 10*3/uL (ref 150–400)
RBC: 3.91 MIL/uL (ref 3.87–5.11)
RDW: 12.8 % (ref 11.5–15.5)
WBC: 11.2 10*3/uL — ABNORMAL HIGH (ref 4.0–10.5)
nRBC: 0 % (ref 0.0–0.2)

## 2020-05-05 LAB — COMPREHENSIVE METABOLIC PANEL
ALT: 21 U/L (ref 0–44)
AST: 24 U/L (ref 15–41)
Albumin: 3.8 g/dL (ref 3.5–5.0)
Alkaline Phosphatase: 85 U/L (ref 38–126)
Anion gap: 10 (ref 5–15)
BUN: 29 mg/dL — ABNORMAL HIGH (ref 8–23)
CO2: 28 mmol/L (ref 22–32)
Calcium: 9 mg/dL (ref 8.9–10.3)
Chloride: 96 mmol/L — ABNORMAL LOW (ref 98–111)
Creatinine, Ser: 0.73 mg/dL (ref 0.44–1.00)
GFR, Estimated: >60 mL/min (ref >60)
Glucose, Bld: 99 mg/dL (ref 70–99)
Potassium: 4.2 mmol/L (ref 3.5–5.1)
Sodium: 134 mmol/L — ABNORMAL LOW (ref 135–145)
Total Bilirubin: 0.8 mg/dL (ref 0.3–1.2)
Total Protein: 6.5 g/dL (ref 6.5–8.1)

## 2020-05-05 LAB — APTT: aPTT: 31 seconds (ref 24–36)

## 2020-05-05 MED ORDER — LIDOCAINE 5 % EX PTCH: 1.0000 | MEDICATED_PATCH | CUTANEOUS | 0 refills | Status: AC

## 2020-05-05 MED ORDER — ACETAMINOPHEN 500 MG PO TABS
1000.0000 mg | ORAL_TABLET | Freq: Once | ORAL | Status: AC
  Administered 2020-05-05: 1000 mg via ORAL
  Filled 2020-05-05: qty 2

## 2020-05-05 MED ORDER — IOHEXOL 300 MG/ML  SOLN
75.0000 mL | Freq: Once | INTRAMUSCULAR | Status: AC | PRN
  Administered 2020-05-05: 75 mL via INTRAVENOUS

## 2020-05-05 MED ORDER — FENTANYL CITRATE (PF) 100 MCG/2ML IJ SOLN
25.0000 ug | Freq: Once | INTRAMUSCULAR | Status: AC
  Administered 2020-05-05: 25 ug via INTRAVENOUS
  Filled 2020-05-05: qty 2

## 2020-05-05 MED ORDER — HYDROCODONE-ACETAMINOPHEN 5-325 MG PO TABS
1.0000 | ORAL_TABLET | ORAL | 0 refills | Status: AC | PRN
Start: 2020-05-05 — End: ?

## 2020-05-05 NOTE — Discharge Instructions (Addendum)
You were evaluated in the Emergency Department and after careful evaluation, we did not find any emergent condition requiring admission or further testing in the hospital.  Your exam/testing today was overall reassuring.  Symptoms seem to be due to a rib fracture.  This may take a few weeks to heal.  Recommend taking Tylenol every 4-6 hours during the day.  Using the numbing patches during the day as well.  For significant pain or pain keeping you from sleeping you can use the Norco medication provided.  We also recommend using the incentive spirometry device to take deep breaths throughout the day, use once every hour while you are awake.  Please return to the Emergency Department if you experience any worsening of your condition.  Thank you for allowing [place] to be a part of your care.

## 2020-05-05 NOTE — ED Notes (Signed)
Pt to CT

## 2020-05-05 NOTE — ED Provider Notes (Signed)
[facility] Emergency Department Provider Note MRN:  [id number]  Arrival date & time: 05/05/20     Chief Complaint   Fall   History of Present Illness   Jamie [name] is a 87 y.o. year-old female with a history of COPD, pacemaker, CHF presenting to the ED with chief complaint of fall.  Patient was walking without her walker and stumbled and fell.  Endorsing severe right flank pain since then.  Family noted crunching sounds at home when they tried to move her.  They are concerned she broke some ribs.  Denies head trauma or loss of consciousness, endorsing acute on chronic neck pain, back pain, no chest pain or shortness of breath, no abdominal pain.  No numbness or weakness to the arms or legs.  Pain is constant, moderate, worse with motion or palpation.  Review of Systems  A complete 10 system review of systems was obtained and all systems are negative except as noted in the HPI and PMH.   Patient's Health History    Past Medical History:  Diagnosis Date  . Asthma   . Carotid stenosis    a. Carotid US (9/15):  Bilateral ICA 1-39%  . Chronic combined systolic and diastolic CHF (congestive heart failure) ([place])    a. EF 40-45% in 11/2013, improved to 55-60% in 05/2016.  Marland Kitchen Complete AV block ([place])   . COPD (chronic obstructive pulmonary disease) ([place])   . Family history of anesthesia complication    SISTER HAS DIFFICULTY WAKING  . GERD (gastroesophageal reflux disease)   . Hypertension   . Ischemic cardiomyopathy    a.  Echo (9/15):  EF 40-45%, ant and apical and inferoapical HK, Gr 1 DD, trivial MR, mild TR, PASP 23 mmHg;  b. Myoview (9/15) large scar in mid LAD territory, no peri-infarct ischemia, EF 60%, apical AK; intermediate risk >> patient opted for med Rx. b. EF 55-60% by echo 05/2016.  . Orthostasis   . Osteoarthritis   . Osteopenia   . Pacemaker   . Pneumonia   . Pre-syncope   . Presence of permanent cardiac pacemaker   . Shortness of breath   .  Tachycardia   . TIA (transient ischemic attack)    a. possible TIA 11/2013.  Marland Kitchen UTI (urinary tract infection)     Past Surgical History:  Procedure Laterality Date  . CARDIOVERSION N/A 12/19/2019   Procedure: CARDIOVERSION (CATH LAB) - For Afib;  Surgeon: Deboraha Sprang, MD;  Location: [place] [facility];  Service: Cardiovascular;  Laterality: N/A;  . CHOLECYSTECTOMY    . EYE SURGERY  10/2004  . HERNIA REPAIR  11/2002  . PACEMAKER PLACEMENT     Medtronic  . PPM GENERATOR CHANGEOUT N/A 12/19/2019   Procedure: PPM GENERATOR CHANGEOUT;  Surgeon: Deboraha Sprang, MD;  Location: [place] [facility];  Service: Cardiovascular;  Laterality: N/A;    Family History  Problem Relation Age of Onset  . Heart disease Sister   . Breast cancer Sister     Social History   Socioeconomic History  . Marital status: Widowed    Spouse name: Not on file  . Number of children: Not on file  . Years of education: Not on file  . Highest education level: Not on file  Occupational History  . Occupation: retired    Fish farm manager: RETIRED  Tobacco Use  . Smoking status: Former Smoker    Packs/day: 1.00    Years: 35.00    Pack years: 35.00  Types: Cigarettes    Quit date: 03/16/1987    Years since quitting: 33.1  . Smokeless tobacco: Never Used  Vaping Use  . Vaping Use: Never used  Substance and Sexual Activity  . Alcohol use: No  . Drug use: No  . Sexual activity: Never    Birth control/protection: Post-menopausal  Other Topics Concern  . Not on file  Social History Narrative  . Not on file   Social Determinants of Health   Financial Resource Strain: Not on file  Food Insecurity: Not on file  Transportation Needs: Not on file  Physical Activity: Not on file  Stress: Not on file  Social Connections: Not on file  Intimate Partner Violence: Not on file     Physical Exam   Vitals:   05/05/20 0100 05/05/20 0130  BP: (!) 144/75 (!) 156/68  Pulse: (!) 59 64  Resp: (!) 22 (!) 21  Temp:     SpO2: 98% 97%    CONSTITUTIONAL: Chronically ill-appearing, NAD NEURO:  Alert and oriented x 3, no focal deficits EYES:  eyes equal and reactive ENT/NECK:  no LAD, no JVD CARDIO: Regular rate, well-perfused, normal S1 and S2 PULM:  CTAB no wheezing or rhonchi GI/GU:  normal bowel sounds, non-distended, non-tender MSK/SPINE:  No gross deformities, no edema SKIN:  no rash, atraumatic PSYCH:  Appropriate speech and behavior  *Additional and/or pertinent findings included in MDM below  Diagnostic and Interventional Summary    EKG Interpretation  Date/Time:    Ventricular Rate:    PR Interval:    QRS Duration:   QT Interval:    QTC Calculation:   R Axis:     Text Interpretation:        Labs Reviewed  CBC - Abnormal; Notable for the following components:      Result Value   WBC 11.2 (*)    MCV 102.6 (*)    All other components within normal limits  COMPREHENSIVE METABOLIC PANEL - Abnormal; Notable for the following components:   Sodium 134 (*)    Chloride 96 (*)    BUN 29 (*)    All other components within normal limits  PROTIME-INR  APTT    CT HEAD WO CONTRAST  Final Result    CT CERVICAL SPINE WO CONTRAST  Final Result    CT CHEST W CONTRAST  Final Result    CT ABDOMEN PELVIS W CONTRAST  Final Result    DG Ribs Unilateral W/Chest Right  Final Result      Medications  fentaNYL (SUBLIMAZE) injection 25 mcg (25 mcg Intravenous Given 05/05/20 0033)  acetaminophen (TYLENOL) tablet 1,000 mg (1,000 mg Oral Given 05/05/20 0024)  iohexol (OMNIPAQUE) 300 MG/ML solution 75 mL (75 mLs Intravenous Contrast Given 05/05/20 0138)     Procedures  /  Critical Care Procedures  ED Course and Medical Decision Making  I have reviewed the triage vital signs, the nursing notes, and pertinent available records from the EMR.  Listed above are laboratory and imaging tests that I personally ordered, reviewed, and interpreted and then considered in my medical decision making  (see below for details).  Mechanical fall, significant flank pain, will obtain CT imaging to evaluate for significant traumatic injury.  Plain film of the chest is reassuring with no pneumothorax.     Imaging reveals fracture of rib 11 on the right.  Pain is better controlled, appropriate for discharge.  Barth Kirks. Sedonia Small, [place] [email or web address]  Final Clinical Impressions(s) / ED Diagnoses     ICD-10-CM   1. Closed fracture of one rib of right side, initial encounter  S22.31XA     ED Discharge Orders         Ordered    lidocaine (LIDODERM) 5 %  Every 24 hours        05/05/20 0255    HYDROcodone-acetaminophen (NORCO/VICODIN) 5-325 MG tablet  Every 4 hours PRN        05/05/20 0255           Discharge Instructions Discussed with and Provided to Patient:     Discharge Instructions     You were evaluated in the Emergency Department and after careful evaluation, we did not find any emergent condition requiring admission or further testing in the hospital.  Your exam/testing today was overall reassuring.  Symptoms seem to be due to a rib fracture.  This may take a few weeks to heal.  Recommend taking Tylenol every 4-6 hours during the day.  Using the numbing patches during the day as well.  For significant pain or pain keeping you from sleeping you can use the Norco medication provided.  We also recommend using the incentive spirometry device to take deep breaths throughout the day, use once every hour while you are awake.  Please return to the Emergency Department if you experience any worsening of your condition.  Thank you for allowing [place] to be a part of your care.        Maudie Flakes, MD 05/05/20 [phone]

## 2020-05-05 NOTE — ED Notes (Signed)
ED Provider at bedside. 

## 2020-05-28 DIAGNOSIS — M810 Age-related osteoporosis without current pathological fracture: Secondary | ICD-10-CM | POA: Diagnosis not present

## 2020-05-28 DIAGNOSIS — R7301 Impaired fasting glucose: Secondary | ICD-10-CM | POA: Diagnosis not present

## 2020-05-28 DIAGNOSIS — R82998 Other abnormal findings in urine: Secondary | ICD-10-CM | POA: Diagnosis not present

## 2020-05-28 DIAGNOSIS — I10 Essential (primary) hypertension: Secondary | ICD-10-CM | POA: Diagnosis not present

## 2020-05-28 DIAGNOSIS — E785 Hyperlipidemia, unspecified: Secondary | ICD-10-CM | POA: Diagnosis not present

## 2020-06-03 DIAGNOSIS — Z0000 Encounter for general adult medical examination without abnormal findings: Secondary | ICD-10-CM | POA: Diagnosis not present

## 2020-06-03 DIAGNOSIS — E785 Hyperlipidemia, unspecified: Secondary | ICD-10-CM | POA: Diagnosis not present

## 2020-06-03 DIAGNOSIS — M160 Bilateral primary osteoarthritis of hip: Secondary | ICD-10-CM | POA: Diagnosis not present

## 2020-06-03 DIAGNOSIS — M810 Age-related osteoporosis without current pathological fracture: Secondary | ICD-10-CM | POA: Diagnosis not present

## 2020-06-03 DIAGNOSIS — R42 Dizziness and giddiness: Secondary | ICD-10-CM | POA: Diagnosis not present

## 2020-06-03 DIAGNOSIS — R0689 Other abnormalities of breathing: Secondary | ICD-10-CM | POA: Diagnosis not present

## 2020-06-03 DIAGNOSIS — M179 Osteoarthritis of knee, unspecified: Secondary | ICD-10-CM | POA: Diagnosis not present

## 2020-06-03 DIAGNOSIS — J449 Chronic obstructive pulmonary disease, unspecified: Secondary | ICD-10-CM | POA: Diagnosis not present

## 2020-06-03 DIAGNOSIS — J45909 Unspecified asthma, uncomplicated: Secondary | ICD-10-CM | POA: Diagnosis not present

## 2020-06-03 DIAGNOSIS — R809 Proteinuria, unspecified: Secondary | ICD-10-CM | POA: Diagnosis not present

## 2020-06-03 DIAGNOSIS — I4891 Unspecified atrial fibrillation: Secondary | ICD-10-CM | POA: Diagnosis not present

## 2020-06-03 DIAGNOSIS — I2510 Atherosclerotic heart disease of native coronary artery without angina pectoris: Secondary | ICD-10-CM | POA: Diagnosis not present

## 2020-06-03 DIAGNOSIS — I5189 Other ill-defined heart diseases: Secondary | ICD-10-CM | POA: Diagnosis not present

## 2020-06-18 ENCOUNTER — Ambulatory Visit (INDEPENDENT_AMBULATORY_CARE_PROVIDER_SITE_OTHER): Payer: Medicare Other

## 2020-06-18 DIAGNOSIS — I255 Ischemic cardiomyopathy: Secondary | ICD-10-CM

## 2020-06-19 LAB — CUP PACEART REMOTE DEVICE CHECK
Battery Remaining Longevity: 119 mo
Battery Voltage: 3.1 V
Brady Statistic RA Percent Paced: 1.41 %
Brady Statistic RV Percent Paced: 99.09 %
Date Time Interrogation Session: 20220406032331
Implantable Lead Implant Date: 20020116
Implantable Lead Implant Date: 20020116
Implantable Lead Location: 753859
Implantable Lead Location: 753860
Implantable Lead Model: 5076
Implantable Pulse Generator Implant Date: 20211006
Lead Channel Impedance Value: 342 Ohm
Lead Channel Impedance Value: 380 Ohm
Lead Channel Impedance Value: 399 Ohm
Lead Channel Impedance Value: 589 Ohm
Lead Channel Pacing Threshold Amplitude: 1.125 V
Lead Channel Pacing Threshold Amplitude: 1.5 V
Lead Channel Pacing Threshold Pulse Width: 0.4 ms
Lead Channel Pacing Threshold Pulse Width: 0.4 ms
Lead Channel Sensing Intrinsic Amplitude: 0.5 mV
Lead Channel Sensing Intrinsic Amplitude: 0.5 mV
Lead Channel Setting Pacing Amplitude: 2.5 V
Lead Channel Setting Pacing Amplitude: 3 V
Lead Channel Setting Pacing Pulse Width: 0.4 ms
Lead Channel Setting Sensing Sensitivity: 0.9 mV

## 2020-06-23 ENCOUNTER — Other Ambulatory Visit: Payer: Self-pay

## 2020-06-23 ENCOUNTER — Ambulatory Visit (INDEPENDENT_AMBULATORY_CARE_PROVIDER_SITE_OTHER): Payer: Medicare Other | Admitting: Internal Medicine

## 2020-06-23 ENCOUNTER — Encounter: Payer: Self-pay | Admitting: Internal Medicine

## 2020-06-23 VITALS — BP 110/50 | HR 80 | Ht 63.0 in | Wt 90.0 lb

## 2020-06-23 DIAGNOSIS — Z950 Presence of cardiac pacemaker: Secondary | ICD-10-CM | POA: Diagnosis not present

## 2020-06-23 DIAGNOSIS — I4891 Unspecified atrial fibrillation: Secondary | ICD-10-CM | POA: Diagnosis not present

## 2020-06-23 DIAGNOSIS — I951 Orthostatic hypotension: Secondary | ICD-10-CM | POA: Diagnosis not present

## 2020-06-23 DIAGNOSIS — I442 Atrioventricular block, complete: Secondary | ICD-10-CM | POA: Diagnosis not present

## 2020-06-23 DIAGNOSIS — I255 Ischemic cardiomyopathy: Secondary | ICD-10-CM

## 2020-06-23 NOTE — Progress Notes (Signed)
[facility]      Patient Care Team: Crist Infante, MD as PCP - General (Internal Medicine) Deboraha Sprang, MD as PCP - Cardiology (Cardiology)   HPI  Jamie [name] is a 87 y.o. female seen in followup for complete heart block for which she is status post pacemaker implantation which is programmed in the VDD mode. She underwent device generator replacement fall 2010,2021 with back-and-forth discussions regarding cardioversion.  Ultimately she chose to undergo cardioversion we did a generator replacement the same day  oxygen dependent COPD   CAD with complete LAD infarct     DATE TEST EF   2012 LHC  No obstructive CAD  9/15 Echo   40% %   3/15 Myoview 60% AnteriorApical  Scar  3/18 Echo   55-65 %          Following cardioversion there was some overall improvement with her daughter noticing less frequent needs to adjust Lasix and perhaps some improvement of breathlessness.  However, after she reverted to atrial fibrillation 12/21 she noted no change in any of these until just last couple of weeks.  No change in diet.  Chronically dyspneic on home oxygen some edema  Date Cr K Hgb  12/19 0.96 4.0 11.3<<13.4   3/21 1.1 4.0 12.9     Past Medical History:  Diagnosis Date  . Asthma   . Carotid stenosis    a. Carotid US (9/15):  Bilateral ICA 1-39%  . Chronic combined systolic and diastolic CHF (congestive heart failure) ([place])    a. EF 40-45% in 11/2013, improved to 55-60% in 05/2016.  Marland Kitchen Complete AV block ([place])   . COPD (chronic obstructive pulmonary disease) ([place])   . Family history of anesthesia complication    SISTER HAS DIFFICULTY WAKING  . GERD (gastroesophageal reflux disease)   . Hypertension   . Ischemic cardiomyopathy    a.  Echo (9/15):  EF 40-45%, ant and apical and inferoapical HK, Gr 1 DD, trivial MR, mild TR, PASP 23 mmHg;  b. Myoview (9/15) large scar in mid LAD territory, no peri-infarct ischemia, EF 60%, apical AK; intermediate risk >> patient opted for med Rx. b.  EF 55-60% by echo 05/2016.  . Orthostasis   . Osteoarthritis   . Osteopenia   . Pacemaker   . Pneumonia   . Pre-syncope   . Presence of permanent cardiac pacemaker   . Shortness of breath   . Tachycardia   . TIA (transient ischemic attack)    a. possible TIA 11/2013.  Marland Kitchen UTI (urinary tract infection)     Past Surgical History:  Procedure Laterality Date  . CARDIOVERSION N/A 12/19/2019   Procedure: CARDIOVERSION (CATH LAB) - For Afib;  Surgeon: Deboraha Sprang, MD;  Location: [facility];  Service: Cardiovascular;  Laterality: N/A;  . CHOLECYSTECTOMY    . EYE SURGERY  10/2004  . HERNIA REPAIR  11/2002  . PACEMAKER PLACEMENT     Medtronic  . PPM GENERATOR CHANGEOUT N/A 12/19/2019   Procedure: PPM GENERATOR CHANGEOUT;  Surgeon: Deboraha Sprang, MD;  Location: [facility];  Service: Cardiovascular;  Laterality: N/A;    Current Outpatient Medications  Medication Sig Dispense Refill  . albuterol (VENTOLIN HFA) 108 (90 Base) MCG/ACT inhaler Inhale 2 puffs into the lungs every 6 (six) hours as needed for wheezing or shortness of breath.     . ALPRAZolam (XANAX) 0.5 MG tablet Take 0.5 mg by mouth at bedtime as needed for sleep.     Marland Kitchen  apixaban (ELIQUIS) 2.5 MG TABS tablet Take 1 tablet (2.5 mg total) by mouth 2 (two) times daily. 180 tablet 3  . bismuth subsalicylate (PEPTO BISMOL) 262 MG chewable tablet Chew 524 mg by mouth daily as needed for diarrhea or loose stools.    . calcium carbonate (OSCAL) 1500 (600 Ca) MG TABS tablet Take 630 mg by mouth daily.    . cyanocobalamin 1000 MCG tablet Take 1,000 mcg by mouth daily.     . furosemide (LASIX) 40 MG tablet Take 40 mg by mouth daily.     Marland Kitchen HYDROcodone-acetaminophen (NORCO/VICODIN) 5-325 MG tablet Take 1 tablet by mouth every 4 (four) hours as needed. 12 tablet 0  . levalbuterol (XOPENEX) 0.63 MG/3ML nebulizer solution Take 0.63 mg by nebulization daily.    Marland Kitchen lidocaine (LIDODERM) 5 % Place 1 patch onto the skin daily. Remove &  Discard patch within 12 hours or as directed by MD 5 patch 0  . loperamide (IMODIUM) 2 MG capsule Take 2 mg by mouth daily as needed for diarrhea or loose stools.    Marland Kitchen losartan (COZAAR) 25 MG tablet Take 1 tablet (25 mg total) by mouth daily. Please make yearly appt with Dr. Caryl Comes for December before anymore refills. 1st attempt 90 tablet 0  . melatonin 3 MG TABS tablet Take 3 mg by mouth at bedtime as needed (sleep).    . nitroGLYCERIN (NITROSTAT) 0.4 MG SL tablet Place 1 tablet (0.4 mg total) under the tongue every 5 (five) minutes as needed for chest pain. 15 tablet 12  . pantoprazole (PROTONIX) 40 MG tablet Take 40 mg by mouth daily.  3  . potassium chloride (K-DUR) 10 MEQ tablet Take 10 mEq by mouth 2 (two) times a week. Tuesday and Friday    . Probiotic Product ([place]) CAPS Take 1 capsule by mouth daily.     No current facility-administered medications for this visit.    Allergies  Allergen Reactions  . Ace Inhibitors Cough  . Hydrocodone-Acetaminophen Other (See Comments)     GI distress    Review of Systems negative except from HPI and PMH  Physical Exam BP (!) 110/50 (BP Location: Left Arm, Patient Position: Sitting, Cuff Size: Normal)   Pulse 80   Ht 5\' 3"  (1.6 m)   Wt 90 lb (40.8 kg)   SpO2 95%   BMI 15.94 kg/m  Well developed and well nourished in no acute distress wearing oxygen  HENT normal Neck supple  Clear Device pocket well healed; without hematoma or erythema.  There is no tethering  Regular rate and rhythm, no   murmur Abd-soft with active BS No Clubbing cyanosis tr edema Skin-warm and dry A & Oriented  Grossly normal sensory and motor function  ECG atrial fibrillation with ventricular pacing at 80  Assessment and  Plan  Coronary artery disease with prior MI  Complete heart block  Pacemaker-Medtronic  The patient's device was interrogated.  The information was reviewed. No changes were made in the programming.     COPD  Atrial  fibrillation-persistent   Patient has persistent atrial fibrillation with some evidence of volume overload clinically.  Given the long stable period following cardioversion it is hard to blame the recent change changes in edema and dyspnea to the atrial fibrillation per se.  Hence, have recommended that we not repeat cardioversion and that we treat the volume load with increased furosemide.  She will take an extra 20-40 mg in the morning in the event of edema.  On Anticoagulation;  No bleeding issues   We also discussed resuscitation status.  The patient's desire not to have CPR in the event of cardiorespiratory arrest.  I think this is why it is, particularly in light of her COPD age which would likely be associated with frail and potentially then flail ribs I have written a order

## 2020-06-23 NOTE — Patient Instructions (Addendum)
Medication Instructions:  Your physician has recommended you make the following change in your medication:   ** Take your Furosemide as discussed by Dr Caryl Comes.  *If you need a refill on your cardiac medications before your next appointment, please call your pharmacy*   Lab Work: None ordered.  If you have labs (blood work) drawn today and your tests are completely normal, you will receive your results only by: Marland Kitchen MyChart Message (if you have MyChart) OR . A paper copy in the mail If you have any lab test that is abnormal or we need to change your treatment, we will call you to review the results.   Testing/Procedures: None ordered.    Follow-Up: At [facility], you and your health needs are our priority.  As part of our continuing mission to provide you with exceptional heart care, we have created designated Provider Care Teams.  These Care Teams include your primary Cardiologist (physician) and Advanced Practice Providers (APPs -  Physician Assistants and Nurse Practitioners) who all work together to provide you with the care you need, when you need it.  We recommend signing up for the patient portal called "MyChart".  Sign up information is provided on this After Visit Summary.  MyChart is used to connect with patients for Virtual Visits (Telemedicine).  Patients are able to view lab/test results, encounter notes, upcoming appointments, etc.  Non-urgent messages can be sent to your provider as well.   To learn more about what you can do with MyChart, go to [email or web address].    Your next appointment:   9 month(s)  The format for your next appointment:   Virtual Visit   Provider:   Virl Axe, MD

## 2020-06-30 NOTE — Progress Notes (Signed)
Remote pacemaker transmission.   

## 2020-08-20 ENCOUNTER — Other Ambulatory Visit: Payer: Self-pay | Admitting: Internal Medicine

## 2020-08-20 NOTE — Telephone Encounter (Signed)
Pt last saw Dr Caryl Comes 06/23/20, last labs 05/05/20 Creat 0.73, age 87, weight 40.8kg, based on specified criteria pt is on appropriate dosage of Eliquis 2.5mg  BID for afib.  Will refill rx.

## 2020-09-12 DEATH — deceased
